# Patient Record
Sex: Male | Born: 1975
Health system: Southern US, Community
[De-identification: ages and names within clinical notes are randomized; demographics above are authoritative.]

## PROBLEM LIST (undated history)

## (undated) DIAGNOSIS — R7303 Prediabetes: Secondary | ICD-10-CM

## (undated) DIAGNOSIS — M1990 Unspecified osteoarthritis, unspecified site: Secondary | ICD-10-CM

## (undated) DIAGNOSIS — I10 Essential (primary) hypertension: Secondary | ICD-10-CM

## (undated) DIAGNOSIS — R609 Edema, unspecified: Secondary | ICD-10-CM

## (undated) DIAGNOSIS — G4730 Sleep apnea, unspecified: Secondary | ICD-10-CM

## (undated) DIAGNOSIS — R12 Heartburn: Secondary | ICD-10-CM

## (undated) DIAGNOSIS — E785 Hyperlipidemia, unspecified: Secondary | ICD-10-CM

## (undated) DIAGNOSIS — R0602 Shortness of breath: Secondary | ICD-10-CM

## (undated) HISTORY — DX: Edema, unspecified: R60.9

## (undated) HISTORY — DX: Sleep apnea, unspecified: G47.30

## (undated) HISTORY — PX: NO PAST SURGERIES: SHX2092

## (undated) HISTORY — DX: Shortness of breath: R06.02

## (undated) HISTORY — DX: Essential (primary) hypertension: I10

## (undated) HISTORY — DX: Hyperlipidemia, unspecified: E78.5

## (undated) HISTORY — DX: Heartburn: R12

---

## 2010-09-19 ENCOUNTER — Institutional Professional Consult (permissible substitution): Payer: Self-pay | Admitting: Internal Medicine

## 2010-10-11 ENCOUNTER — Ambulatory Visit: Payer: Self-pay | Admitting: Family Medicine

## 2010-11-17 ENCOUNTER — Ambulatory Visit: Payer: Self-pay | Admitting: Family Medicine

## 2010-11-22 ENCOUNTER — Ambulatory Visit (INDEPENDENT_AMBULATORY_CARE_PROVIDER_SITE_OTHER): Payer: Self-pay | Admitting: Family Medicine

## 2010-11-22 ENCOUNTER — Other Ambulatory Visit: Payer: Self-pay | Admitting: Family Medicine

## 2010-11-22 ENCOUNTER — Encounter: Payer: Self-pay | Admitting: Family Medicine

## 2010-11-22 VITALS — BP 138/90 | HR 97 | Temp 98.7°F | Ht 71.0 in | Wt 382.4 lb

## 2010-11-22 DIAGNOSIS — R319 Hematuria, unspecified: Secondary | ICD-10-CM

## 2010-11-22 DIAGNOSIS — I10 Essential (primary) hypertension: Secondary | ICD-10-CM

## 2010-11-22 LAB — POCT URINALYSIS DIPSTICK
Bilirubin, UA: NEGATIVE
Glucose, UA: NEGATIVE
Leukocytes, UA: NEGATIVE
Nitrite, UA: NEGATIVE
Urobilinogen, UA: 0.2

## 2010-11-22 MED ORDER — LISINOPRIL-HYDROCHLOROTHIAZIDE 20-12.5 MG PO TABS: ORAL_TABLET | ORAL | Status: DC

## 2010-11-22 NOTE — Progress Notes (Signed)
  Subjective:    Sean [name] is a 35 y.o. male who presents for evaluation of elevated blood pressures. Age at onset of elevated blood pressure:  17. Cardiac symptoms: mild vertigo. Patient denies: chest pain, chest pressure/discomfort, claudication, dyspnea, exertional chest pressure/discomfort, fatigue, irregular heart beat, lower extremity edema, near-syncope, orthopnea, palpitations, paroxysmal nocturnal dyspnea, syncope and tachypnea. Cardiovascular risk factors: family history of premature cardiovascular disease, hypertension, male gender, obesity (BMI >= 30 kg/m2) and sedentary lifestyle. Use of agents associated with hypertension: none. History of target organ damage: none.  The following portions of the patient's history were reviewed and updated as appropriate: allergies, current medications, past family history, past medical history, past social history, past surgical history and problem list.  Review of Systems Pertinent items are noted in HPI.   Objective:    BP 138/90  Pulse 97  Temp(Src) 98.7 F (37.1 C) (Oral)  Ht 5\' 11"  (1.803 m)  Wt 382 lb 6.4 oz (173.456 kg)  BMI 53.33 kg/m2  SpO2 98% General appearance: alert, cooperative, appears stated age and no distress Neck: no carotid bruit, no JVD, supple, symmetrical, trachea midline and thyroid not enlarged, symmetric, no tenderness/mass/nodules Lungs: clear to auscultation bilaterally Heart: regular rate and rhythm, S1, S2 normal, no murmur, click, rub or gallop Extremities: extremities normal, atraumatic, no cyanosis or edema  Cardiographics ECG: normal sinus rhythm    Assessment:    Hypertension, stage 1 . Evidence of target organ damage: none.    Plan:    Medication: increase to lisinopril 20/12.5  2 po qd . Screening labs for initial evaluation: basic metabolic panel, blood sugar, creatinine, lipid panel, potassium and urinalysis. Dietary sodium restriction. Regular aerobic exercise. Follow up: 2 weeks  and as needed.

## 2010-11-22 NOTE — Progress Notes (Signed)
Addended by: Legrand Como on: 11/22/2010 03:03 PM   Modules accepted: Orders

## 2010-11-22 NOTE — Progress Notes (Signed)
Addended by: Legrand Como on: 11/22/2010 03:02 PM   Modules accepted: Orders

## 2010-11-22 NOTE — Patient Instructions (Signed)

## 2010-11-23 LAB — CBC WITH DIFFERENTIAL/PLATELET
Basophils Absolute: 0 10*3/uL (ref 0.0–0.1)
Eosinophils Relative: 3.6 % (ref 0.0–5.0)
HCT: 44.1 % (ref 39.0–52.0)
Hemoglobin: 15 g/dL (ref 13.0–17.0)
Lymphocytes Relative: 42.3 % (ref 12.0–46.0)
Monocytes Relative: 10.8 % (ref 3.0–12.0)
Neutro Abs: 3 10*3/uL (ref 1.4–7.7)
Platelets: 252 10*3/uL (ref 150.0–400.0)
RDW: 13.4 % (ref 11.5–14.6)
WBC: 7 10*3/uL (ref 4.5–10.5)

## 2010-11-23 LAB — HEPATIC FUNCTION PANEL
ALT: 31 U/L (ref 0–53)
AST: 22 U/L (ref 0–37)
Alkaline Phosphatase: 55 U/L (ref 39–117)
Total Bilirubin: 0.7 mg/dL (ref 0.3–1.2)

## 2010-11-23 LAB — BASIC METABOLIC PANEL
Calcium: 9.1 mg/dL (ref 8.4–10.5)
GFR: 121.57 mL/min (ref >60.00)
Glucose, Bld: 75 mg/dL (ref 70–99)
Potassium: 3.8 mEq/L (ref 3.5–5.1)
Sodium: 137 mEq/L (ref 135–145)

## 2010-11-23 LAB — LIPID PANEL
Total CHOL/HDL Ratio: 6
VLDL: 26.6 mg/dL (ref 0.0–40.0)

## 2010-11-23 LAB — TSH: TSH: 1.17 u[IU]/mL (ref 0.35–5.50)

## 2010-12-06 ENCOUNTER — Ambulatory Visit: Payer: Self-pay | Admitting: Family Medicine

## 2010-12-08 ENCOUNTER — Encounter: Payer: Self-pay | Admitting: Family Medicine

## 2010-12-08 ENCOUNTER — Ambulatory Visit (INDEPENDENT_AMBULATORY_CARE_PROVIDER_SITE_OTHER): Payer: No Typology Code available for payment source | Admitting: Family Medicine

## 2010-12-08 VITALS — BP 128/88 | HR 105 | Temp 99.7°F | Wt 374.0 lb

## 2010-12-08 DIAGNOSIS — I10 Essential (primary) hypertension: Secondary | ICD-10-CM

## 2010-12-08 DIAGNOSIS — J40 Bronchitis, not specified as acute or chronic: Secondary | ICD-10-CM

## 2010-12-08 MED ORDER — CEFUROXIME AXETIL 500 MG PO TABS: 500.0000 mg | ORAL_TABLET | Freq: Two times a day (BID) | ORAL | Status: AC

## 2010-12-08 MED ORDER — ALBUTEROL SULFATE (5 MG/ML) 0.5% IN NEBU
2.5000 mg | INHALATION_SOLUTION | Freq: Once | RESPIRATORY_TRACT | Status: AC
  Administered 2010-12-08: 2.5 mg via RESPIRATORY_TRACT

## 2010-12-08 NOTE — Patient Instructions (Signed)

## 2010-12-08 NOTE — Progress Notes (Signed)
  Subjective:     Sean [name] is a 35 y.o. male who presents for evaluation of symptoms of a URI. Symptoms include congestion, cough described as productive, low grade fever, nasal congestion and shortness of breath. Onset of symptoms was 7 days ago, and has been gradually worsening since that time. Treatment to date: coricidan and mucinex.  The following portions of the patient's history were reviewed and updated as appropriate: allergies, current medications, past family history, past medical history, past social history, past surgical history and problem list.  Review of Systems Pertinent items are noted in HPI.   Objective:    BP 128/88  Pulse 105  Temp(Src) 99.7 F (37.6 C) (Oral)  Wt 374 lb (169.645 kg)  SpO2 96% General appearance: alert, cooperative, appears stated age and no distress Ears: normal TM's and external ear canals both ears Nose: green discharge, mild congestion Throat: lips, mucosa, and tongue normal; teeth and gums normal Neck: no adenopathy, supple, symmetrical, trachea midline and thyroid not enlarged, symmetric, no tenderness/mass/nodules Lungs: diminished breath sounds bilaterally and wheezes bilaterally Heart: regular rate and rhythm, S1, S2 normal, no murmur, click, rub or gallop Lymph nodes: Cervical, supraclavicular, and axillary nodes normal.   Assessment:    bronchitis  HTN--  Stable,   con't meds Plan:    Suggested symptomatic OTC remedies. Nasal saline spray for congestion. Ceftin per orders. Nasal steroids per orders. Follow up as needed. Call in several days if symptoms aren't resolving. Follow up in 3 months  or as needed.

## 2011-03-10 ENCOUNTER — Ambulatory Visit: Payer: No Typology Code available for payment source | Admitting: Family Medicine

## 2011-03-13 ENCOUNTER — Encounter: Payer: Self-pay | Admitting: Family Medicine

## 2011-03-13 ENCOUNTER — Ambulatory Visit (INDEPENDENT_AMBULATORY_CARE_PROVIDER_SITE_OTHER): Payer: No Typology Code available for payment source | Admitting: Family Medicine

## 2011-03-13 VITALS — BP 118/86 | HR 86 | Temp 97.9°F | Wt 370.0 lb

## 2011-03-13 DIAGNOSIS — E785 Hyperlipidemia, unspecified: Secondary | ICD-10-CM

## 2011-03-13 DIAGNOSIS — Z23 Encounter for immunization: Secondary | ICD-10-CM

## 2011-03-13 DIAGNOSIS — I10 Essential (primary) hypertension: Secondary | ICD-10-CM

## 2011-03-13 MED ORDER — LISINOPRIL-HYDROCHLOROTHIAZIDE 20-12.5 MG PO TABS: ORAL_TABLET | ORAL | Status: DC

## 2011-03-13 NOTE — Patient Instructions (Signed)

## 2011-03-13 NOTE — Progress Notes (Signed)
  Subjective:    Patient here for follow-up of elevated blood pressure.  He is exercising and is adherent to a low-salt diet.  Blood pressure is well controlled at home. Cardiac symptoms: none. Patient denies: chest pain, chest pressure/discomfort, claudication, dyspnea, exertional chest pressure/discomfort, fatigue, irregular heart beat, lower extremity edema, near-syncope, orthopnea, palpitations, paroxysmal nocturnal dyspnea, syncope and tachypnea. Cardiovascular risk factors: hypertension, male gender and obesity (BMI >= 30 kg/m2). Use of agents associated with hypertension: none. History of target organ damage: none.  The following portions of the patient's history were reviewed and updated as appropriate: allergies, current medications, past family history, past medical history, past social history, past surgical history and problem list.  Review of Systems Pertinent items are noted in HPI.     Objective:    BP 118/86  Pulse 86  Temp(Src) 97.9 F (36.6 C) (Oral)  Wt 370 lb (167.831 kg)  SpO2 96% General appearance: alert, cooperative, appears stated age, no distress and morbidly obese Lungs: clear to auscultation bilaterally Heart: S1, S2 normal Extremities: extremities normal, atraumatic, no cyanosis or edema    Assessment:    Hypertension, normal blood pressure . Evidence of target organ damage: none.   hyperlipidemia Plan:    Medication: continue lisinopril. Dietary sodium restriction. Regular aerobic exercise. Check blood pressures 2-3 times weekly and record. Follow up: 3 months and as needed.

## 2011-03-14 LAB — BASIC METABOLIC PANEL
CO2: 28 mEq/L (ref 19–32)
GFR: 85.79 mL/min (ref >60.00)
Glucose, Bld: 83 mg/dL (ref 70–99)
Potassium: 4 mEq/L (ref 3.5–5.1)
Sodium: 138 mEq/L (ref 135–145)

## 2011-03-14 LAB — HEPATIC FUNCTION PANEL
Bilirubin, Direct: 0.1 mg/dL (ref 0.0–0.3)
Total Bilirubin: 0.8 mg/dL (ref 0.3–1.2)
Total Protein: 7.7 g/dL (ref 6.0–8.3)

## 2011-03-14 LAB — LIPID PANEL
HDL: 40 mg/dL (ref >39.00)
VLDL: 18.2 mg/dL (ref 0.0–40.0)

## 2011-03-28 MED ORDER — ATORVASTATIN CALCIUM 20 MG PO TABS: 20.0000 mg | ORAL_TABLET | Freq: Every day | ORAL | Status: DC

## 2011-04-17 ENCOUNTER — Ambulatory Visit (INDEPENDENT_AMBULATORY_CARE_PROVIDER_SITE_OTHER): Payer: No Typology Code available for payment source | Admitting: Internal Medicine

## 2011-04-17 ENCOUNTER — Encounter: Payer: Self-pay | Admitting: Internal Medicine

## 2011-04-17 VITALS — BP 120/82 | HR 106 | Temp 99.1°F | Ht 70.75 in | Wt 361.0 lb

## 2011-04-17 DIAGNOSIS — J069 Acute upper respiratory infection, unspecified: Secondary | ICD-10-CM

## 2011-04-17 DIAGNOSIS — L259 Unspecified contact dermatitis, unspecified cause: Secondary | ICD-10-CM

## 2011-04-17 DIAGNOSIS — L309 Dermatitis, unspecified: Secondary | ICD-10-CM

## 2011-04-17 MED ORDER — PREDNISONE 10 MG PO TABS: ORAL_TABLET | ORAL | Status: DC

## 2011-04-17 NOTE — Progress Notes (Signed)
  Subjective:    Patient ID: Sean [name], male    DOB: 1975/09/27, 36 y.o.   MRN: [id number]  HPI  Acute visit  Chief complaint today is a rash. The rash started about 4 days ago, one day after he worked in the yard and was exposed to poison ivy. It is very itchy, starting the face and neck, spread to the abdomen and yesterday between the fingers and at the wrists. 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 Has been using Benadryl and over-the-counter topical medication with some relief Also, 2 days ago developed a mild sore throat with some nose congestion and a very mild sputum production.     Past Medical History  Diagnosis Date  . Hypertension      Review of Systems No fever or chills Mild chest congestion but no shortness of breath. He also complains of an ill-defined pain, left side of the mouth, "it is not a dental pain" points to the left side of the tongue and gum.    Objective:   Physical Exam  Constitutional: He appears well-developed. No distress.  HENT:  Right Ear: External ear normal.  Left Ear: External ear normal.       Lips, tongue normal, palpation of gum w/no evidence of abcess, throat symmetric, tonsil slt enlarged but normal  Cardiovascular: Normal rate, regular rhythm and normal heart sounds.   No murmur heard. Pulmonary/Chest: Effort normal and breath sounds normal. No respiratory distress. He has no wheezes. He has no rales.  Lymphadenopathy:    He has no cervical adenopathy.  Skin: He is not diaphoretic.       Diffuse maculopapular erythema at the abdomen, wrists, some on the middle of the anterior chest; similar scattered spot in the forehead and frontal scalp. He does have some classic linear, raised, small blister lesions and the hands and between the fingers.          Assessment & Plan:  Dermatitis: He does have some classic poison ivy lesions and other areas that are not classic; does not have generalize rash and does not look  systemically ill thus will treat for poison ivy URI: Mild cough likely from an URI. See instruction Tongue, dental pain?: Id no better soon, needs to see the dentist

## 2011-04-17 NOTE — Patient Instructions (Signed)
Poison [employer] ivy is a rash caused by touching the leaves of the poison ivy plant. The rash often shows up 48 hours later. You might just have bumps, redness, and itching. Sometimes, blisters appear and break open. Your eyes may get puffy (swollen). Poison ivy often heals in 2 to 3 weeks without treatment. HOME CARE  If you touch poison ivy:   Wash your skin with soap and water right away. Wash under your fingernails. Do not rub the skin very hard.   Wash any clothes you were wearing.   Avoid poison ivy in the future. Poison ivy has 3 leaves on a stem.   Use medicine to help with itching as told by your doctor. Do not drive when you take this medicine.   Keep open sores dry, clean, and covered with a bandage and medicated cream, if needed.   Ask your doctor about medicine for children.  GET HELP RIGHT AWAY IF:  You have open sores.   Redness spreads beyond the area of the rash.   There is yellowish white fluid (pus) coming from the rash.   Pain gets worse.   You have a temperature by mouth above 102 F (38.9 C), not controlled by medicine.  MAKE SURE YOU:  Understand these instructions.   Will watch your condition.   Will get help right away if you are not doing well or get worse.  Document Released: 02/25/2010 Document Revised: 01/12/2011 Document Reviewed: 02/25/2010 [facility] Patient Information 2012 [place], [place].    Take prednisone as prescribed mucinex DM for the cough Call in few days if no better , call any time if you get worse: fever, more cough, lip-tongue swelling If the tonge pain continue, you need to see the dentist

## 2011-06-12 ENCOUNTER — Encounter: Payer: No Typology Code available for payment source | Admitting: Family Medicine

## 2011-06-12 DIAGNOSIS — Z0289 Encounter for other administrative examinations: Secondary | ICD-10-CM

## 2011-06-13 NOTE — Progress Notes (Signed)
This encounter was created in error - please disregard.

## 2011-07-20 ENCOUNTER — Emergency Department (HOSPITAL_BASED_OUTPATIENT_CLINIC_OR_DEPARTMENT_OTHER)
Admission: EM | Admit: 2011-07-20 | Discharge: 2011-07-20 | Disposition: A | Discharge status: Home / Self Care | Payer: Self-pay | Attending: Emergency Medicine | Admitting: Emergency Medicine

## 2011-07-20 DIAGNOSIS — I10 Essential (primary) hypertension: Secondary | ICD-10-CM | POA: Insufficient documentation

## 2011-07-20 DIAGNOSIS — M79609 Pain in unspecified limb: Secondary | ICD-10-CM | POA: Insufficient documentation

## 2011-07-20 DIAGNOSIS — L089 Local infection of the skin and subcutaneous tissue, unspecified: Secondary | ICD-10-CM

## 2011-07-20 MED ORDER — DOXYCYCLINE HYCLATE 100 MG PO CAPS: 100.0000 mg | ORAL_CAPSULE | Freq: Two times a day (BID) | ORAL | Status: AC

## 2011-07-20 MED ORDER — HYDROCODONE-ACETAMINOPHEN 5-325 MG PO TABS: 2.0000 | ORAL_TABLET | ORAL | Status: AC | PRN

## 2011-07-20 MED ORDER — DOXYCYCLINE HYCLATE 100 MG PO CAPS: 100.0000 mg | ORAL_CAPSULE | Freq: Two times a day (BID) | ORAL | Status: DC

## 2011-07-20 NOTE — ED Provider Notes (Signed)
Medical screening examination/treatment/procedure(s) were performed by non-physician practitioner and as supervising physician I was immediately available for consultation/collaboration.    A , MD 07/20/11 2315 

## 2011-07-20 NOTE — ED Notes (Signed)
Right finger pain and swelling at cuticle. Redness and swelling noted.

## 2011-07-20 NOTE — Discharge Instructions (Signed)
Fingertip Infection   When an infection is around the nail, it is called a paronychia. When it appears over the tip of the finger, it is called a felon. These infections are due to minor injuries or cracks in the skin. If they are not treated properly, they can lead to bone infection and permanent damage to the fingernail.   Incision and drainage is necessary if a pus pocket (an abscess) has formed. Antibiotics and pain medicine may also be needed. Keep your hand elevated for the next 2-3 days to reduce swelling and pain. If a pack was placed in the abscess, it should be removed in 1-2 days by your caregiver. Soak the finger in warm water for 20 minutes 4 times daily to help promote drainage.   Keep the hands as dry as possible. Wear protective gloves with cotton liners. See your caregiver for follow-up care as recommended.   HOME CARE INSTRUCTIONS   Keep wound clean, dry and dressed as suggested by your caregiver.   Soak in warm salt water for fifteen minutes, four times per day for bacterial infections.   Your caregiver will prescribe an antibiotic if a bacterial infection is suspected. Take antibiotics as directed and finish the prescription, even if the problem appears to be improving before the medicine is gone.   Only take over-the-counter or prescription medicines for pain, discomfort, or fever as directed by your caregiver.   SEEK IMMEDIATE MEDICAL CARE IF:   There is redness, swelling, or increasing pain in the wound.   Pus or any other unusual drainage is coming from the wound.   An unexplained oral temperature above 102 F (38.9 C) develops.   You notice a foul smell coming from the wound or dressing.   MAKE SURE YOU:   Understand these instructions.   Monitor your condition.   Contact your caregiver if you are getting worse or not improving.   Document Released: 03/02/2004 Document Revised: 01/12/2011 Document Reviewed: 02/27/2008   [employer] Patient Information 2012 [employer].

## 2011-07-20 NOTE — ED Provider Notes (Signed)
History     CSN: [id number]  Arrival date & time 07/20/11  4098   First MD Initiated Contact with Patient 07/20/11 1852      Chief Complaint  Patient presents with  . Hand Pain    Right pointer finger    (Consider location/radiation/quality/duration/timing/severity/associated sxs/prior treatment) Patient is a 36 y.o. male presenting with hand pain. The history is provided by the patient. No language interpreter was used.  Hand Pain This is a new problem. The current episode started in the past 7 days. The problem occurs constantly. The problem has been gradually worsening. He has tried NSAIDs for the symptoms. The treatment provided no relief.  Pt complains of pain in the end of his finger,  Pt has redness and swelling  Past Medical History  Diagnosis Date  . Hypertension     No past surgical history on file.  Family History  Problem Relation Age of Onset  . Heart disease Father 9    MI  . Hypertension Father   . Sudden death Father     Heart disease  . Diabetes Mother     Borderline  . Hyperlipidemia Mother   . Hypertension Mother   . Breast cancer Maternal Grandmother     History  Substance Use Topics  . Smoking status: Never Smoker   . Smokeless tobacco: Never Used  . Alcohol Use: Yes      Review of Systems  Skin: Positive for wound.  All other systems reviewed and are negative.    Allergies  Review of patient's allergies indicates no known allergies.  Home Medications   Current Outpatient Rx  Name Route Sig Dispense Refill  . IBUPROFEN 200 MG PO TABS Oral Take 600-800 mg by mouth daily as needed. For pain    . LISINOPRIL-HYDROCHLOROTHIAZIDE 20-12.5 MG PO TABS Oral Take 2 tablets by mouth daily.      BP 139/82  Pulse 93  Temp 98.3 F (36.8 C) (Oral)  Resp 20  Wt 364 lb (165.109 kg)  SpO2 96%  Physical Exam  Nursing note and vitals reviewed. Constitutional: He appears well-developed and well-nourished.  Musculoskeletal: He exhibits  tenderness.       Tender right index finger,  Red from dip down,  Erythema around cuticle,  No fluctuance.  Neurological: He is alert.  Skin: Skin is warm.  Psychiatric: He has a normal mood and affect.    ED Course  Procedures (including critical care time)  Labs Reviewed - No data to display No results found.   No diagnosis found.    MDM  I used an 18 gauge to see if there is any pus around nail,  Blood only,        Lonia Skinner [place], [place] 07/20/11 1909

## 2011-08-21 ENCOUNTER — Other Ambulatory Visit: Payer: Self-pay | Admitting: Family Medicine

## 2011-08-21 MED ORDER — LISINOPRIL-HYDROCHLOROTHIAZIDE 20-12.5 MG PO TABS: 2.0000 | ORAL_TABLET | Freq: Every day | ORAL | Status: DC

## 2011-08-21 NOTE — Telephone Encounter (Signed)
REFILL Lisinopril-Hydrochlorothiazide (Tab) PRINZIDE,ZESTORETIC 20-12.5 MG Take 2 tablets by mouth daily. #60, LAST FILL 5.31.13 laST OV 3.11.13

## 2013-10-22 ENCOUNTER — Telehealth: Payer: Self-pay | Admitting: Family Medicine

## 2013-10-22 NOTE — Telephone Encounter (Signed)
Caller name: Shi Relation to pt: self Call back number: [phone] Pharmacy: [facility] in [place] off [address]  Reason for call:   Patient has not been seen since 2013 and is out of lisinopril and would like refill to be [facility] to [facility]. I did schedule appointment for cpe/med refill for 9/21

## 2013-10-23 MED ORDER — LISINOPRIL-HYDROCHLOROTHIAZIDE 20-12.5 MG PO TABS: 2.0000 | ORAL_TABLET | Freq: Every day | ORAL | Status: DC

## 2013-10-23 NOTE — Telephone Encounter (Signed)
Please advise if refill appropriate. He has a pending apt.    KP

## 2013-10-23 NOTE — Telephone Encounter (Signed)
Rx faxed for a 30 day supply.     KP

## 2013-10-23 NOTE — Telephone Encounter (Signed)
#  30 only

## 2013-10-27 ENCOUNTER — Ambulatory Visit (INDEPENDENT_AMBULATORY_CARE_PROVIDER_SITE_OTHER): Payer: PRIVATE HEALTH INSURANCE | Admitting: Family Medicine

## 2013-10-27 ENCOUNTER — Encounter: Payer: Self-pay | Admitting: Family Medicine

## 2013-10-27 VITALS — BP 152/97 | HR 72 | Temp 98.4°F | Ht 70.5 in | Wt 394.8 lb

## 2013-10-27 DIAGNOSIS — Z23 Encounter for immunization: Secondary | ICD-10-CM

## 2013-10-27 DIAGNOSIS — Z0000 Encounter for general adult medical examination without abnormal findings: Secondary | ICD-10-CM

## 2013-10-27 DIAGNOSIS — I10 Essential (primary) hypertension: Secondary | ICD-10-CM

## 2013-10-27 LAB — CBC WITH DIFFERENTIAL/PLATELET
Basophils Absolute: 0 10*3/uL (ref 0.0–0.1)
Basophils Relative: 0.5 % (ref 0.0–3.0)
EOS PCT: 4 % (ref 0.0–5.0)
Eosinophils Absolute: 0.4 10*3/uL (ref 0.0–0.7)
HEMATOCRIT: 42.3 % (ref 39.0–52.0)
HEMOGLOBIN: 14.5 g/dL (ref 13.0–17.0)
LYMPHS ABS: 2.8 10*3/uL (ref 0.7–4.0)
Lymphocytes Relative: 27.4 % (ref 12.0–46.0)
MCHC: 34.2 g/dL (ref 30.0–36.0)
MCV: 83.8 fl (ref 78.0–100.0)
MONOS PCT: 8.1 % (ref 3.0–12.0)
Monocytes Absolute: 0.8 10*3/uL (ref 0.1–1.0)
NEUTROS ABS: 6.1 10*3/uL (ref 1.4–7.7)
Neutrophils Relative %: 60 % (ref 43.0–77.0)
Platelets: 239 10*3/uL (ref 150.0–400.0)
RBC: 5.05 Mil/uL (ref 4.22–5.81)
RDW: 13.5 % (ref 11.5–15.5)
WBC: 10.1 10*3/uL (ref 4.0–10.5)

## 2013-10-27 LAB — TSH: TSH: 0.44 u[IU]/mL (ref 0.35–4.50)

## 2013-10-27 LAB — BASIC METABOLIC PANEL
BUN: 12 mg/dL (ref 6–23)
CALCIUM: 8.7 mg/dL (ref 8.4–10.5)
CHLORIDE: 106 meq/L (ref 96–112)
CO2: 26 meq/L (ref 19–32)
Creatinine, Ser: 0.9 mg/dL (ref 0.4–1.5)
GFR: 98.67 mL/min (ref >60.00)
Glucose, Bld: 79 mg/dL (ref 70–99)
Potassium: 3.8 mEq/L (ref 3.5–5.1)
SODIUM: 140 meq/L (ref 135–145)

## 2013-10-27 LAB — HEPATIC FUNCTION PANEL
ALBUMIN: 3.7 g/dL (ref 3.5–5.2)
ALT: 32 U/L (ref 0–53)
AST: 22 U/L (ref 0–37)
Alkaline Phosphatase: 57 U/L (ref 39–117)
Bilirubin, Direct: 0 mg/dL (ref 0.0–0.3)
Total Bilirubin: 0.4 mg/dL (ref 0.2–1.2)
Total Protein: 7.1 g/dL (ref 6.0–8.3)

## 2013-10-27 LAB — POCT URINALYSIS DIPSTICK
Bilirubin, UA: NEGATIVE
Blood, UA: NEGATIVE
GLUCOSE UA: NEGATIVE
KETONES UA: NEGATIVE
Leukocytes, UA: NEGATIVE
Nitrite, UA: NEGATIVE
Protein, UA: NEGATIVE
UROBILINOGEN UA: 0.2
pH, UA: 5.5

## 2013-10-27 LAB — LIPID PANEL
CHOLESTEROL: 225 mg/dL — AB (ref 0–200)
HDL: 32.5 mg/dL — ABNORMAL LOW (ref >39.00)
NonHDL: 192.5
Total CHOL/HDL Ratio: 7
Triglycerides: 204 mg/dL — ABNORMAL HIGH (ref 0.0–149.0)
VLDL: 40.8 mg/dL — ABNORMAL HIGH (ref 0.0–40.0)

## 2013-10-27 LAB — LDL CHOLESTEROL, DIRECT: Direct LDL: 186.2 mg/dL

## 2013-10-27 MED ORDER — LISINOPRIL-HYDROCHLOROTHIAZIDE 20-12.5 MG PO TABS: 2.0000 | ORAL_TABLET | Freq: Every day | ORAL | Status: DC

## 2013-10-27 NOTE — Progress Notes (Signed)
Subjective:    Patient ID: Sean [name], male    DOB: September 19, 1975, 38 y.o.   MRN: [id number]  HPI Pt here for cpe and labs.  Pt ran out of meds Thursday. No other complaints.    Review of Systems  Constitutional: Negative.   HENT: Negative for congestion, ear pain, hearing loss, nosebleeds, postnasal drip, rhinorrhea, sinus pressure, sneezing and tinnitus.   Eyes: Negative for photophobia, discharge, itching and visual disturbance.  Respiratory: Negative.   Cardiovascular: Negative.   Gastrointestinal: Negative for abdominal pain, constipation, blood in stool, abdominal distention and anal bleeding.  Endocrine: Negative.   Genitourinary: Negative.   Musculoskeletal: Negative.   Skin: Negative.   Allergic/Immunologic: Negative.   Neurological: Negative for dizziness, weakness, light-headedness, numbness and headaches.  Psychiatric/Behavioral: Negative for suicidal ideas, confusion, sleep disturbance, dysphoric mood, decreased concentration and agitation. The patient is not nervous/anxious.    Past Medical History  Diagnosis Date  . Hypertension    History   Social History  . Marital Status: Single    Spouse Name: N/A    Number of Children: N/A  . Years of Education: N/A   Occupational History  . Not on file.   Social History Main Topics  . Smoking status: Never Smoker   . Smokeless tobacco: Never Used  . Alcohol Use: Yes  . Drug Use: No  . Sexual Activity: Yes   Other Topics Concern  . Not on file   Social History Narrative  . No narrative on file   Family History  Problem Relation Age of Onset  . Heart disease Father 10    MI  . Hypertension Father   . Sudden death Father     Heart disease  . Diabetes Mother     Borderline  . Hyperlipidemia Mother   . Hypertension Mother   . Breast cancer Maternal Grandmother    Current Outpatient Prescriptions  Medication Sig Dispense Refill  . ibuprofen (ADVIL,MOTRIN) 200 MG tablet Take 600-800 mg by mouth  daily as needed. For pain      . lisinopril-hydrochlorothiazide (PRINZIDE,ZESTORETIC) 20-12.5 MG per tablet Take 2 tablets by mouth daily.  180 tablet  3   No current facility-administered medications for this visit.   No Known Allergies      Objective:   Physical Exam  Nursing note and vitals reviewed. Constitutional: He is oriented to person, place, and time. He appears well-developed and well-nourished. No distress.  HENT:  Head: Normocephalic and atraumatic.  Right Ear: External ear normal.  Left Ear: External ear normal.  Nose: Nose normal.  Mouth/Throat: Oropharynx is clear and moist. No oropharyngeal exudate.  Eyes: Conjunctivae and EOM are normal. Pupils are equal, round, and reactive to light. Right eye exhibits no discharge. Left eye exhibits no discharge.  Neck: Normal range of motion. Neck supple. No JVD present. No thyromegaly present.  Cardiovascular: Normal rate, regular rhythm and intact distal pulses.  Exam reveals no gallop and no friction rub.   No murmur heard. Pulmonary/Chest: Effort normal and breath sounds normal. No respiratory distress. He has no wheezes. He has no rales. He exhibits no tenderness.  Abdominal: Soft. Bowel sounds are normal. He exhibits no distension and no mass. There is no tenderness. There is no rebound and no guarding.  Genitourinary: Rectum normal, prostate normal and penis normal. Guaiac negative stool.  Musculoskeletal: Normal range of motion. He exhibits no edema and no tenderness.  Lymphadenopathy:    He has no cervical adenopathy.  Neurological: He  is alert and oriented to person, place, and time. He displays normal reflexes. He exhibits normal muscle tone.  Skin: Skin is warm and dry. No rash noted. He is not diaphoretic. No erythema. No pallor.  Psychiatric: He has a normal mood and affect. His behavior is normal. Judgment and thought content normal.   Filed Vitals:   10/27/13 1028  BP: 152/97  Pulse: 72  Temp: 98.4 F (36.9  C)  TempSrc: Oral  Height: 5' 10.5" (1.791 m)  Weight: 394 lb 12.8 oz (179.08 kg)  SpO2: 100%          Assessment & Plan:  1. Essential hypertension Check labs Elevated today --- increase dose  - lisinopril-hydrochlorothiazide (PRINZIDE,ZESTORETIC) 20-12.5 MG per tablet; Take 2 tablets by mouth daily.  Dispense: 180 tablet; Refill: 3 - Basic metabolic panel - CBC with Differential - Hepatic function panel - Lipid panel - POCT urinalysis dipstick - TSH  2. Preventative health care Check labs ghm utd  - Basic metabolic panel - CBC with Differential - Hepatic function panel - Lipid panel - POCT urinalysis dipstick - TSH

## 2013-10-27 NOTE — Progress Notes (Signed)
Pre visit review using our clinic review tool, if applicable. No additional management support is needed unless otherwise documented below in the visit note. 

## 2013-10-27 NOTE — Patient Instructions (Signed)
Preventive Care for Adults A healthy lifestyle and preventive care can promote health and wellness. Preventive health guidelines for men include the following key practices:  A routine yearly physical is a good way to check with your health care provider about your health and preventative screening. It is a chance to share any concerns and updates on your health and to receive a thorough exam.  Visit your dentist for a routine exam and preventative care every 6 months. Brush your teeth twice a day and floss once a day. Good oral hygiene prevents tooth decay and gum disease.  The frequency of eye exams is based on your age, health, family medical history, use of contact lenses, and other factors. Follow your health care provider's recommendations for frequency of eye exams.  Eat a healthy diet. Foods such as vegetables, fruits, whole grains, low-fat dairy products, and lean protein foods contain the nutrients you need without too many calories. Decrease your intake of foods high in solid fats, added sugars, and salt. Eat the right amount of calories for you.Get information about a proper diet from your health care provider, if necessary.  Regular physical exercise is one of the most important things you can do for your health. Most adults should get at least 150 minutes of moderate-intensity exercise (any activity that increases your heart rate and causes you to sweat) each week. In addition, most adults need muscle-strengthening exercises on 2 or more days a week.  Maintain a healthy weight. The body mass index (BMI) is a screening tool to identify possible weight problems. It provides an estimate of body fat based on height and weight. Your health care provider can find your BMI and can help you achieve or maintain a healthy weight.For adults 20 years and older:  A BMI below 18.5 is considered underweight.  A BMI of 18.5 to 24.9 is normal.  A BMI of 25 to 29.9 is considered overweight.  A BMI  of 30 and above is considered obese.  Maintain normal blood lipids and cholesterol levels by exercising and minimizing your intake of saturated fat. Eat a balanced diet with plenty of fruit and vegetables. Blood tests for lipids and cholesterol should begin at age 50 and be repeated every 5 years. If your lipid or cholesterol levels are high, you are over 50, or you are at high risk for heart disease, you may need your cholesterol levels checked more frequently.Ongoing high lipid and cholesterol levels should be treated with medicines if diet and exercise are not working.  If you smoke, find out from your health care provider how to quit. If you do not use tobacco, do not start.  Lung cancer screening is recommended for adults aged 73-80 years who are at high risk for developing lung cancer because of a history of smoking. A yearly low-dose CT scan of the lungs is recommended for people who have at least a 30-pack-year history of smoking and are a current smoker or have quit within the past 15 years. A pack year of smoking is smoking an average of 1 pack of cigarettes a day for 1 year (for example: 1 pack a day for 30 years or 2 packs a day for 15 years). Yearly screening should continue until the smoker has stopped smoking for at least 15 years. Yearly screening should be stopped for people who develop a health problem that would prevent them from having lung cancer treatment.  If you choose to drink alcohol, do not have more than  2 drinks per day. One drink is considered to be 12 ounces (355 mL) of beer, 5 ounces (148 mL) of wine, or 1.5 ounces (44 mL) of liquor.  Avoid use of street drugs. Do not share needles with anyone. Ask for help if you need support or instructions about stopping the use of drugs.  High blood pressure causes heart disease and increases the risk of stroke. Your blood pressure should be checked at least every 1-2 years. Ongoing high blood pressure should be treated with  medicines, if weight loss and exercise are not effective.  If you are 45-79 years old, ask your health care provider if you should take aspirin to prevent heart disease.  Diabetes screening involves taking a blood sample to check your fasting blood sugar level. This should be done once every 3 years, after age 45, if you are within normal weight and without risk factors for diabetes. Testing should be considered at a younger age or be carried out more frequently if you are overweight and have at least 1 risk factor for diabetes.  Colorectal cancer can be detected and often prevented. Most routine colorectal cancer screening begins at the age of 50 and continues through age 75. However, your health care provider may recommend screening at an earlier age if you have risk factors for colon cancer. On a yearly basis, your health care provider may provide home test kits to check for hidden blood in the stool. Use of a small camera at the end of a tube to directly examine the colon (sigmoidoscopy or colonoscopy) can detect the earliest forms of colorectal cancer. Talk to your health care provider about this at age 50, when routine screening begins. Direct exam of the colon should be repeated every 5-10 years through age 75, unless early forms of precancerous polyps or small growths are found.  People who are at an increased risk for hepatitis B should be screened for this virus. You are considered at high risk for hepatitis B if:  You were born in a country where hepatitis B occurs often. Talk with your health care provider about which countries are considered high risk.  Your parents were born in a high-risk country and you have not received a shot to protect against hepatitis B (hepatitis B vaccine).  You have HIV or AIDS.  You use needles to inject street drugs.  You live with, or have sex with, someone who has hepatitis B.  You are a man who has sex with other men (MSM).  You get hemodialysis  treatment.  You take certain medicines for conditions such as cancer, organ transplantation, and autoimmune conditions.  Hepatitis C blood testing is recommended for all people born from 1945 through 1965 and any individual with known risks for hepatitis C.  Practice safe sex. Use condoms and avoid high-risk sexual practices to reduce the spread of sexually transmitted infections (STIs). STIs include gonorrhea, chlamydia, syphilis, trichomonas, herpes, HPV, and human immunodeficiency virus (HIV). Herpes, HIV, and HPV are viral illnesses that have no cure. They can result in disability, cancer, and death.  If you are at risk of being infected with HIV, it is recommended that you take a prescription medicine daily to prevent HIV infection. This is called preexposure prophylaxis (PrEP). You are considered at risk if:  You are a man who has sex with other men (MSM) and have other risk factors.  You are a heterosexual man, are sexually active, and are at increased risk for HIV infection.    You take drugs by injection.  You are sexually active with a partner who has HIV.  Talk with your health care provider about whether you are at high risk of being infected with HIV. If you choose to begin PrEP, you should first be tested for HIV. You should then be tested every 3 months for as long as you are taking PrEP.  A one-time screening for abdominal aortic aneurysm (AAA) and surgical repair of large AAAs by ultrasound are recommended for men ages 32 to 67 years who are current or former smokers.  Healthy men should no longer receive prostate-specific antigen (PSA) blood tests as part of routine cancer screening. Talk with your health care provider about prostate cancer screening.  Testicular cancer screening is not recommended for adult males who have no symptoms. Screening includes self-exam, a health care provider exam, and other screening tests. Consult with your health care provider about any symptoms  you have or any concerns you have about testicular cancer.  Use sunscreen. Apply sunscreen liberally and repeatedly throughout the day. You should seek shade when your shadow is shorter than you. Protect yourself by wearing long sleeves, pants, a wide-brimmed hat, and sunglasses year round, whenever you are outdoors.  Once a month, do a whole-body skin exam, using a mirror to look at the skin on your back. Tell your health care provider about new moles, moles that have irregular borders, moles that are larger than a pencil eraser, or moles that have changed in shape or color.  Stay current with required vaccines (immunizations).  Influenza vaccine. All adults should be immunized every year.  Tetanus, diphtheria, and acellular pertussis (Td, Tdap) vaccine. An adult who has not previously received Tdap or who does not know his vaccine status should receive 1 dose of Tdap. This initial dose should be followed by tetanus and diphtheria toxoids (Td) booster doses every 10 years. Adults with an unknown or incomplete history of completing a 3-dose immunization series with Td-containing vaccines should begin or complete a primary immunization series including a Tdap dose. Adults should receive a Td booster every 10 years.  Varicella vaccine. An adult without evidence of immunity to varicella should receive 2 doses or a second dose if he has previously received 1 dose.  Human papillomavirus (HPV) vaccine. Males aged 68-21 years who have not received the vaccine previously should receive the 3-dose series. Males aged 22-26 years may be immunized. Immunization is recommended through the age of 6 years for any male who has sex with males and did not get any or all doses earlier. Immunization is recommended for any person with an immunocompromised condition through the age of 49 years if he did not get any or all doses earlier. During the 3-dose series, the second dose should be obtained 4-8 weeks after the first  dose. The third dose should be obtained 24 weeks after the first dose and 16 weeks after the second dose.  Zoster vaccine. One dose is recommended for adults aged 50 years or older unless certain conditions are present.  Measles, mumps, and rubella (MMR) vaccine. Adults born before [birth year] generally are considered immune to measles and mumps. Adults born in [birth year] or later should have 1 or more doses of MMR vaccine unless there is a contraindication to the vaccine or there is laboratory evidence of immunity to each of the three diseases. A routine second dose of MMR vaccine should be obtained at least 28 days after the first dose for students attending postsecondary  schools, health care workers, or international travelers. People who received inactivated measles vaccine or an unknown type of measles vaccine during 1963-1967 should receive 2 doses of MMR vaccine. People who received inactivated mumps vaccine or an unknown type of mumps vaccine before 1979 and are at high risk for mumps infection should consider immunization with 2 doses of MMR vaccine. Unvaccinated health care workers born before 1957 who lack laboratory evidence of measles, mumps, or rubella immunity or laboratory confirmation of disease should consider measles and mumps immunization with 2 doses of MMR vaccine or rubella immunization with 1 dose of MMR vaccine.  Pneumococcal 13-valent conjugate (PCV13) vaccine. When indicated, a person who is uncertain of his immunization history and has no record of immunization should receive the PCV13 vaccine. An adult aged 19 years or older who has certain medical conditions and has not been previously immunized should receive 1 dose of PCV13 vaccine. This PCV13 should be followed with a dose of pneumococcal polysaccharide (PPSV23) vaccine. The PPSV23 vaccine dose should be obtained at least 8 weeks after the dose of PCV13 vaccine. An adult aged 19 years or older who has certain medical conditions and  previously received 1 or more doses of PPSV23 vaccine should receive 1 dose of PCV13. The PCV13 vaccine dose should be obtained 1 or more years after the last PPSV23 vaccine dose.  Pneumococcal polysaccharide (PPSV23) vaccine. When PCV13 is also indicated, PCV13 should be obtained first. All adults aged 65 years and older should be immunized. An adult younger than age 65 years who has certain medical conditions should be immunized. Any person who resides in a nursing home or long-term care facility should be immunized. An adult smoker should be immunized. People with an immunocompromised condition and certain other conditions should receive both PCV13 and PPSV23 vaccines. People with human immunodeficiency virus (HIV) infection should be immunized as soon as possible after diagnosis. Immunization during chemotherapy or radiation therapy should be avoided. Routine use of PPSV23 vaccine is not recommended for American Indians, [place] Natives, or people younger than 65 years unless there are medical conditions that require PPSV23 vaccine. When indicated, people who have unknown immunization and have no record of immunization should receive PPSV23 vaccine. One-time revaccination 5 years after the first dose of PPSV23 is recommended for people aged 19-64 years who have chronic kidney failure, nephrotic syndrome, asplenia, or immunocompromised conditions. People who received 1-2 doses of PPSV23 before age 65 years should receive another dose of PPSV23 vaccine at age 65 years or later if at least 5 years have passed since the previous dose. Doses of PPSV23 are not needed for people immunized with PPSV23 at or after age 65 years.  Meningococcal vaccine. Adults with asplenia or persistent complement component deficiencies should receive 2 doses of quadrivalent meningococcal conjugate (MenACWY-D) vaccine. The doses should be obtained at least 2 months apart. Microbiologists working with certain meningococcal bacteria,  military recruits, people at risk during an outbreak, and people who travel to or live in countries with a high rate of meningitis should be immunized. A first-year college student up through age 21 years who is living in a residence hall should receive a dose if he did not receive a dose on or after his 16th birthday. Adults who have certain high-risk conditions should receive one or more doses of vaccine.  Hepatitis A vaccine. Adults who wish to be protected from this disease, have certain high-risk conditions, work with hepatitis A-infected animals, work in hepatitis A research labs, or   travel to or work in countries with a high rate of hepatitis A should be immunized. Adults who were previously unvaccinated and who anticipate close contact with an international adoptee during the first 60 days after arrival in the [place] from a country with a high rate of hepatitis A should be immunized.  Hepatitis B vaccine. Adults should be immunized if they wish to be protected from this disease, have certain high-risk conditions, may be exposed to blood or other infectious body fluids, are household contacts or sex partners of hepatitis B positive people, are clients or workers in certain care facilities, or travel to or work in countries with a high rate of hepatitis B.  Haemophilus influenzae type b (Hib) vaccine. A previously unvaccinated person with asplenia or sickle cell disease or having a scheduled splenectomy should receive 1 dose of Hib vaccine. Regardless of previous immunization, a recipient of a hematopoietic stem cell transplant should receive a 3-dose series 6-12 months after his successful transplant. Hib vaccine is not recommended for adults with HIV infection. Preventive Service / Frequency Ages 52 to 17  Blood pressure check.** / Every 1 to 2 years.  Lipid and cholesterol check.** / Every 5 years beginning at age 69.  Hepatitis C blood test.** / For any individual with known risks for  hepatitis C.  Skin self-exam. / Monthly.  Influenza vaccine. / Every year.  Tetanus, diphtheria, and acellular pertussis (Tdap, Td) vaccine.** / Consult your health care provider. 1 dose of Td every 10 years.  Varicella vaccine.** / Consult your health care provider.  HPV vaccine. / 3 doses over 6 months, if 72 or younger.  Measles, mumps, rubella (MMR) vaccine.** / You need at least 1 dose of MMR if you were born in 1957 or later. You may also need a second dose.  Pneumococcal 13-valent conjugate (PCV13) vaccine.** / Consult your health care provider.  Pneumococcal polysaccharide (PPSV23) vaccine.** / 1 to 2 doses if you smoke cigarettes or if you have certain conditions.  Meningococcal vaccine.** / 1 dose if you are age 35 to 60 years and a Market researcher living in a residence hall, or have one of several medical conditions. You may also need additional booster doses.  Hepatitis A vaccine.** / Consult your health care provider.  Hepatitis B vaccine.** / Consult your health care provider.  Haemophilus influenzae type b (Hib) vaccine.** / Consult your health care provider. Ages 35 to 8  Blood pressure check.** / Every 1 to 2 years.  Lipid and cholesterol check.** / Every 5 years beginning at age 57.  Lung cancer screening. / Every year if you are aged 44-80 years and have a 30-pack-year history of smoking and currently smoke or have quit within the past 15 years. Yearly screening is stopped once you have quit smoking for at least 15 years or develop a health problem that would prevent you from having lung cancer treatment.  Fecal occult blood test (FOBT) of stool. / Every year beginning at age 55 and continuing until age 73. You may not have to do this test if you get a colonoscopy every 10 years.  Flexible sigmoidoscopy** or colonoscopy.** / Every 5 years for a flexible sigmoidoscopy or every 10 years for a colonoscopy beginning at age 28 and continuing until age  1.  Hepatitis C blood test.** / For all people born from [birth year] through 1965 and any individual with known risks for hepatitis C.  Skin self-exam. / Monthly.  Influenza vaccine. / Every  year.  Tetanus, diphtheria, and acellular pertussis (Tdap/Td) vaccine.** / Consult your health care provider. 1 dose of Td every 10 years.  Varicella vaccine.** / Consult your health care provider.  Zoster vaccine.** / 1 dose for adults aged 53 years or older.  Measles, mumps, rubella (MMR) vaccine.** / You need at least 1 dose of MMR if you were born in 1957 or later. You may also need a second dose.  Pneumococcal 13-valent conjugate (PCV13) vaccine.** / Consult your health care provider.  Pneumococcal polysaccharide (PPSV23) vaccine.** / 1 to 2 doses if you smoke cigarettes or if you have certain conditions.  Meningococcal vaccine.** / Consult your health care provider.  Hepatitis A vaccine.** / Consult your health care provider.  Hepatitis B vaccine.** / Consult your health care provider.  Haemophilus influenzae type b (Hib) vaccine.** / Consult your health care provider. Ages 77 and over  Blood pressure check.** / Every 1 to 2 years.  Lipid and cholesterol check.**/ Every 5 years beginning at age 85.  Lung cancer screening. / Every year if you are aged 55-80 years and have a 30-pack-year history of smoking and currently smoke or have quit within the past 15 years. Yearly screening is stopped once you have quit smoking for at least 15 years or develop a health problem that would prevent you from having lung cancer treatment.  Fecal occult blood test (FOBT) of stool. / Every year beginning at age 33 and continuing until age 11. You may not have to do this test if you get a colonoscopy every 10 years.  Flexible sigmoidoscopy** or colonoscopy.** / Every 5 years for a flexible sigmoidoscopy or every 10 years for a colonoscopy beginning at age 28 and continuing until age 73.  Hepatitis C blood  test.** / For all people born from [birth year] through 1965 and any individual with known risks for hepatitis C.  Abdominal aortic aneurysm (AAA) screening.** / A one-time screening for ages 50 to 27 years who are current or former smokers.  Skin self-exam. / Monthly.  Influenza vaccine. / Every year.  Tetanus, diphtheria, and acellular pertussis (Tdap/Td) vaccine.** / 1 dose of Td every 10 years.  Varicella vaccine.** / Consult your health care provider.  Zoster vaccine.** / 1 dose for adults aged 34 years or older.  Pneumococcal 13-valent conjugate (PCV13) vaccine.** / Consult your health care provider.  Pneumococcal polysaccharide (PPSV23) vaccine.** / 1 dose for all adults aged 63 years and older.  Meningococcal vaccine.** / Consult your health care provider.  Hepatitis A vaccine.** / Consult your health care provider.  Hepatitis B vaccine.** / Consult your health care provider.  Haemophilus influenzae type b (Hib) vaccine.** / Consult your health care provider. **Family history and personal history of risk and conditions may change your health care provider's recommendations. Document Released: 03/21/2001 Document Revised: 01/28/2013 Document Reviewed: 06/20/2010 [facility] Patient Information 2015 [place], [place]. This information is not intended to replace advice given to you by your health care provider. Make sure you discuss any questions you have with your health care provider.

## 2013-10-29 ENCOUNTER — Telehealth: Payer: Self-pay | Admitting: Family Medicine

## 2013-10-29 NOTE — Telephone Encounter (Signed)
Still having the leg pain wake him up in the middle of the night, it now is happening to his right leg. It is waking him up from a dead sleep. Is there a vitamin he can take to help? Or something else? Please advise.  Requesting lab results

## 2013-10-30 NOTE — Telephone Encounter (Signed)
MSG left on VM for a return call.     KP 

## 2013-10-30 NOTE — Telephone Encounter (Signed)
Try calcium  With magnesium, tonic water ( quinine)

## 2013-10-30 NOTE — Telephone Encounter (Signed)
Patient has been made aware and voiced understanding.     KP 

## 2013-11-05 ENCOUNTER — Other Ambulatory Visit: Payer: Self-pay

## 2013-11-05 MED ORDER — SIMVASTATIN 20 MG PO TABS: 20.0000 mg | ORAL_TABLET | Freq: Every day | ORAL | Status: DC

## 2014-01-07 ENCOUNTER — Other Ambulatory Visit: Payer: Self-pay | Admitting: Family Medicine

## 2014-02-01 ENCOUNTER — Emergency Department (HOSPITAL_BASED_OUTPATIENT_CLINIC_OR_DEPARTMENT_OTHER)
Admission: EM | Admit: 2014-02-01 | Discharge: 2014-02-01 | Disposition: A | Discharge status: Home / Self Care | Payer: No Typology Code available for payment source | Attending: Emergency Medicine | Admitting: Emergency Medicine

## 2014-02-01 ENCOUNTER — Encounter (HOSPITAL_BASED_OUTPATIENT_CLINIC_OR_DEPARTMENT_OTHER): Payer: Self-pay | Admitting: *Deleted

## 2014-02-01 DIAGNOSIS — Z23 Encounter for immunization: Secondary | ICD-10-CM | POA: Diagnosis not present

## 2014-02-01 DIAGNOSIS — Y9389 Activity, other specified: Secondary | ICD-10-CM | POA: Insufficient documentation

## 2014-02-01 DIAGNOSIS — Z79899 Other long term (current) drug therapy: Secondary | ICD-10-CM | POA: Diagnosis not present

## 2014-02-01 DIAGNOSIS — Y998 Other external cause status: Secondary | ICD-10-CM | POA: Diagnosis not present

## 2014-02-01 DIAGNOSIS — W260XXA Contact with knife, initial encounter: Secondary | ICD-10-CM | POA: Diagnosis not present

## 2014-02-01 DIAGNOSIS — I10 Essential (primary) hypertension: Secondary | ICD-10-CM | POA: Insufficient documentation

## 2014-02-01 DIAGNOSIS — Z791 Long term (current) use of non-steroidal anti-inflammatories (NSAID): Secondary | ICD-10-CM | POA: Insufficient documentation

## 2014-02-01 DIAGNOSIS — S61012A Laceration without foreign body of left thumb without damage to nail, initial encounter: Secondary | ICD-10-CM | POA: Diagnosis present

## 2014-02-01 DIAGNOSIS — Y9289 Other specified places as the place of occurrence of the external cause: Secondary | ICD-10-CM | POA: Diagnosis not present

## 2014-02-01 DIAGNOSIS — S61211A Laceration without foreign body of left index finger without damage to nail, initial encounter: Secondary | ICD-10-CM | POA: Insufficient documentation

## 2014-02-01 DIAGNOSIS — S61412A Laceration without foreign body of left hand, initial encounter: Secondary | ICD-10-CM

## 2014-02-01 MED ORDER — LIDOCAINE HCL (PF) 1 % IJ SOLN: 5.0000 mL | Freq: Once | INTRAMUSCULAR | Status: DC

## 2014-02-01 MED ORDER — LIDOCAINE HCL (PF) 2 % IJ SOLN
10.0000 mL | Freq: Once | INTRAMUSCULAR | Status: AC
  Administered 2014-02-01: 5 mL via INTRADERMAL

## 2014-02-01 MED ORDER — LIDOCAINE HCL (PF) 2 % IJ SOLN
INTRAMUSCULAR | Status: AC
  Filled 2014-02-01: qty 2

## 2014-02-01 MED ORDER — TETANUS-DIPHTH-ACELL PERTUSSIS 5-2.5-18.5 LF-MCG/0.5 IM SUSP
0.5000 mL | Freq: Once | INTRAMUSCULAR | Status: AC
  Administered 2014-02-01: 0.5 mL via INTRAMUSCULAR
  Filled 2014-02-01: qty 0.5

## 2014-02-01 NOTE — ED Provider Notes (Signed)
CSN: [id number]     Arrival date & time 02/01/14  1443 History   First MD Initiated Contact with Patient 02/01/14 1547     Chief Complaint  Patient presents with  . Extremity Laceration     (Consider location/radiation/quality/duration/timing/severity/associated sxs/prior Treatment) The history is provided by the patient and medical records.    38 year old male with past medical history of hypertension, presenting to the ED for left hand  Laceration. Patient states he was cutting the tags off of a new fishing pole with a new pocket knife, knife slipped and accidentally stabbed his hand in between his thumb and index finger. States knife blade did not go in deep.  Bleeding well controlled on arrival.  Denies numbness/paresthesias of fingers.  Date of last tetanus unknown.  Patient is right hand dominant.  Past Medical History  Diagnosis Date  . Hypertension    History reviewed. No pertinent past surgical history. Family History  Problem Relation Age of Onset  . Heart disease Father 2556    MI  . Hypertension Father   . Sudden death Father     Heart disease  . Diabetes Mother     Borderline  . Hyperlipidemia Mother   . Hypertension Mother   . Breast cancer Maternal Grandmother    History  Substance Use Topics  . Smoking status: Never Smoker   . Smokeless tobacco: Never Used  . Alcohol Use: Yes    Review of Systems  Skin: Positive for wound.  All other systems reviewed and are negative.     Allergies  Review of patient's allergies indicates no known allergies.  Home Medications   Prior to Admission medications   Medication Sig Start Date End Date Taking? Authorizing Provider  ibuprofen (ADVIL,MOTRIN) 200 MG tablet Take 600-800 mg by mouth daily as needed. For pain    Historical Provider, MD  lisinopril-hydrochlorothiazide (PRINZIDE,ZESTORETIC) 20-12.5 MG per tablet TAKE 2 TABLETS BY MOUTH DAILY. 01/08/14   Lelon PerlaYvonne R Lowne, DO  simvastatin (ZOCOR) 20 MG tablet Take 1  tablet (20 mg total) by mouth at bedtime. 11/05/13   Yvonne R Lowne, DO   BP 154/109 mmHg  Pulse 85  Temp(Src) 98.6 F (37 C) (Oral)  Resp 18  Ht 5\' 11"  (1.803 m)  Wt 375 lb (170.099 kg)  BMI 52.33 kg/m2  SpO2 99%   Physical Exam  Constitutional: He is oriented to person, place, and time. He appears well-developed and well-nourished. No distress.  HENT:  Head: Normocephalic and atraumatic.  Mouth/Throat: Oropharynx is clear and moist.  Eyes: Conjunctivae and EOM are normal. Pupils are equal, round, and reactive to light.  Neck: Normal range of motion. Neck supple.  Cardiovascular: Normal rate, regular rhythm and normal heart sounds.   Pulmonary/Chest: Effort normal and breath sounds normal. No respiratory distress. He has no wheezes.  Musculoskeletal: Normal range of motion.       Left hand: He exhibits laceration. He exhibits normal range of motion, no tenderness, no bony tenderness, normal capillary refill and no deformity. Normal sensation noted. Normal strength noted.       Hands: Left hand with 1cm rather superficial laceration between thumb and index finger; no evidence of deep tissue or tendon involvement; full flexion/extension of all fingers without difficulty; normal sensation throughout all fingers; strong radial pulse and cap refill  Neurological: He is alert and oriented to person, place, and time.  Skin: Skin is warm and dry. He is not diaphoretic.  Psychiatric: He has a normal mood and  affect.  Nursing note and vitals reviewed.   ED Course  Procedures (including critical care time)  LACERATION REPAIR Performed by: Garlon HatchetSANDERS,  M Authorized by: Garlon HatchetSANDERS,  M Consent: Verbal consent obtained. Risks and benefits: risks, benefits and alternatives were discussed Consent given by: patient Patient identity confirmed: provided demographic data Prepped and Draped in normal sterile fashion Wound explored  Laceration Location: webbed space between left thumb and  index finger  Laceration Length: 1 cm  No Foreign Bodies seen or palpated  Anesthesia: local infiltration  Local anesthetic: lidocaine 2% without epinephrine  Anesthetic total: 5 ml  Irrigation method: syringe Amount of cleaning: standard  Skin closure: 4-0 prolene  Number of sutures: 2  Technique: simple interrupted.  Patient tolerance: Patient tolerated the procedure well with no immediate complications.  Labs Review Labs Reviewed - No data to display  Imaging Review No results found.   EKG Interpretation None      MDM   Final diagnoses:  Hand laceration, left, initial encounter   38 year old male with left hand laceration due to pocketknife. He reports pocketknife was new and clean. On exam, rather superficial laceration of webspace between left thumb and index finger. There is no evidence of deep tissue, vessel, or tendon involvement. Patient has normal range of motion of all fingers and hand is neurovascularly intact. Tetanus was updated. Laceration repaired as above, patient tolerated well. He will follow with his primary care physician in 5-7 days for suture removal.  Discussed plan with patient, he/she acknowledged understanding and agreed with plan of care.  Return precautions given for new or worsening symptoms.   Garlon HatchetLisa M , PA-C 02/01/14 1659  Mirian MoMatthew Gentry, MD 02/07/14 [phone]

## 2014-02-01 NOTE — Discharge Instructions (Signed)
Keep sutures clean and dry.  Recommend keeping them covered while working, may leave exposed at home. Follow-up with your physician in 5-7 days for suture removal. Return to the ED for new concerns or signs of infection (redness, swelling, drainage, etc.)

## 2014-02-01 NOTE — ED Notes (Addendum)
Pt has a lac to left hand, between thumb and index finger. Bleeding controlled at this time.

## 2014-02-07 ENCOUNTER — Other Ambulatory Visit: Payer: Self-pay | Admitting: Family Medicine

## 2014-02-09 ENCOUNTER — Ambulatory Visit (INDEPENDENT_AMBULATORY_CARE_PROVIDER_SITE_OTHER): Payer: No Typology Code available for payment source | Admitting: Medical

## 2014-02-09 ENCOUNTER — Encounter: Payer: Self-pay | Admitting: Medical

## 2014-02-09 VITALS — BP 150/96 | HR 83 | Temp 98.7°F | Ht 70.5 in | Wt 395.6 lb

## 2014-02-09 DIAGNOSIS — J0100 Acute maxillary sinusitis, unspecified: Secondary | ICD-10-CM | POA: Insufficient documentation

## 2014-02-09 DIAGNOSIS — I10 Essential (primary) hypertension: Secondary | ICD-10-CM

## 2014-02-09 DIAGNOSIS — L089 Local infection of the skin and subcutaneous tissue, unspecified: Secondary | ICD-10-CM | POA: Insufficient documentation

## 2014-02-09 DIAGNOSIS — T148 Other injury of unspecified body region: Secondary | ICD-10-CM

## 2014-02-09 DIAGNOSIS — T148XXA Other injury of unspecified body region, initial encounter: Principal | ICD-10-CM

## 2014-02-09 MED ORDER — DOXYCYCLINE HYCLATE 100 MG PO TABS: 100.0000 mg | ORAL_TABLET | Freq: Two times a day (BID) | ORAL | Status: DC

## 2014-02-09 MED ORDER — BENZONATATE 100 MG PO CAPS: 100.0000 mg | ORAL_CAPSULE | Freq: Three times a day (TID) | ORAL | Status: DC | PRN

## 2014-02-09 MED ORDER — FLUTICASONE PROPIONATE 50 MCG/ACT NA SUSP: 2.0000 | Freq: Every day | NASAL | Status: DC

## 2014-02-09 NOTE — Assessment & Plan Note (Signed)
Regarding your blood pressure. Check bp daily next 10 days and follow up in 10-14 days for your bp. You may need adjustment of meds.

## 2014-02-09 NOTE — Patient Instructions (Addendum)
Your appear to have a sinus infection. I am prescribing doxycycline  antibiotic for the infection. To help with the nasal congestion I prescribed  flonase nasal steroid. For your associated cough, I prescribed cough medicine benzonatate.  You appear to have none healing left hand laceration with probable infection and possible pocket of abscess. Sutures not removed today. Got wound culture today and will refer you to hand surgeon. Will try to get you in by Thursday of this week but if area worsens before then let us know.  Regarding your blood pressure. Check bp daily next 10 days and follow up in 10-14 days for your bp. You may need adjustment of meds on follow up.    Rest, hydrate, tylenol for fever.  Follow up in 7 days or as needed.  Note local hand specialist would not see pt due to his insurance. So he will come back in on Thursday to recheck wound. At that point the sutures will be about 45 days old. They would need to be removed most likley. If the area still does not look like healing appropriately  then would try to refer to out of town hand specialist.

## 2014-02-09 NOTE — Telephone Encounter (Signed)
Refill request for Zocor sent.

## 2014-02-09 NOTE — Assessment & Plan Note (Signed)
Your appear to have a sinus infection. I am prescribing doxycycline  antibiotic for the infection. To help with the nasal congestion I prescribed  flonase nasal steroid. For your associated cough, I prescribed cough medicine benzonatate.

## 2014-02-09 NOTE — Assessment & Plan Note (Signed)
You appear to have none healing left hand laceration with probable infection and possible pocket of abscess. Sutures not removed today. Got wound culture today and will refer you to hand surgeon. Will try to get you in by Thursday of this week but if area worsens before then let us know.

## 2014-02-09 NOTE — Progress Notes (Signed)
Subjective:    Patient ID: Sean [name], male    DOB: 1975-09-04, 39 y.o.   MRN: [id number]  HPI  Pt in and got left hand laceration about 88 days old. The area is some drainage. Pt states around the edge of the wound he has some pain. Pt states the area is inflamed. No fevers, no chills. No sweats. Pt has worn a brace at work open air at night. Pt was given topical ointment but nor oral medications. Pt not diabetic.  Also some cough, congestion and runny nose since last Friday.  Sinus pressures.  Past Medical History  Diagnosis Date  . Hypertension     History   Social History  . Marital Status: Single    Spouse Name: N/A    Number of Children: N/A  . Years of Education: N/A   Occupational History  . Not on file.   Social History Main Topics  . Smoking status: Never Smoker   . Smokeless tobacco: Never Used  . Alcohol Use: Yes  . Drug Use: No  . Sexual Activity: Yes   Other Topics Concern  . Not on file   Social History Narrative    No past surgical history on file.  Family History  Problem Relation Age of Onset  . Heart disease Father 24    MI  . Hypertension Father   . Sudden death Father     Heart disease  . Diabetes Mother     Borderline  . Hyperlipidemia Mother   . Hypertension Mother   . Breast cancer Maternal Grandmother     No Known Allergies  Current Outpatient Prescriptions on File Prior to Visit  Medication Sig Dispense Refill  . ibuprofen (ADVIL,MOTRIN) 200 MG tablet Take 600-800 mg by mouth daily as needed. For pain    . lisinopril-hydrochlorothiazide (PRINZIDE,ZESTORETIC) 20-12.5 MG per tablet TAKE 2 TABLETS BY MOUTH DAILY. 60 tablet 5  . simvastatin (ZOCOR) 20 MG tablet TAKE 1 TABLET (20 MG TOTAL) BY MOUTH AT BEDTIME. 30 tablet 2   No current facility-administered medications on file prior to visit.    BP 150/96 mmHg  Pulse 83  Temp(Src) 98.7 F (37.1 C) (Oral)  Ht 5' 10.5" (1.791 m)  Wt 395 lb 9.6 oz (179.443 kg)  BMI 55.94  kg/m2  SpO2 95%       Review of Systems  Constitutional: Negative for fever, chills and fatigue.  HENT: Positive for congestion and rhinorrhea. Negative for sore throat.   Respiratory: Positive for cough. Negative for chest tightness and wheezing.   Cardiovascular: Negative for chest pain and palpitations.  Gastrointestinal: Negative for abdominal pain.  Musculoskeletal: Negative for back pain.  Skin:       A little soreness around the wound. Wound not healing well. Some yellow discharge recently.  Neurological: Negative for dizziness, seizures, weakness, numbness and headaches.  Hematological: Negative for adenopathy.       Objective:   Physical Exam   General  Mental Status - Alert. General Appearance - Well groomed. Not in acute distress.  Skin Rashes- No Rashes.  HEENT Head- Normal. Ear Auditory Canal - Left- Normal. Right - Normal.Tympanic Membrane- Left- Normal. Right- Normal. Eye Sclera/Conjunctiva- Left- Normal. Right- Normal. Nose & Sinuses Nasal Mucosa- Left-  Boggy + Congested. Right-    boggy + Congested. Maxillary sinus pressure Mouth & Throat Lips: Upper Lip- Normal: no dryness, cracking, pallor, cyanosis, or vesicular eruption. Lower Lip-Normal: no dryness, cracking, pallor, cyanosis or vesicular eruption. Buccal Mucosa-  Bilateral- No Aphthous ulcers. Oropharynx- No Discharge or Erythema. Tonsils: Characteristics- Bilateral- No Erythema or Congestion. Size/Enlargement- Bilateral- No enlargement. Discharge- bilateral-None.  Neck Neck- Supple. No Masses.   Chest and Lung Exam Auscultation: Breath Sounds:- even and unlabored, but bilateral upper lobe rhonchi.  Cardiovascular Auscultation:Rythm- Regular, rate and rhythm. Murmurs & Other Heart Sounds:Ausculatation of the heart reveal- No Murmurs.  Lymphatic Head & Neck General Head & Neck Lymphatics: Bilateral: Description- No Localized lymphadenopathy.  Neurologic CN III-XII grossly intact. Neg  romberg.  Lt hand- between 1st and 2nd digit 10 mm laceration 2 sutures in place but wound is not healing. Indurated and very tender around the edge of wound. Edges of wound are not opposed. Pulse intact, good capillary refill. No ascending lymphadenopathy.         Assessment & Plan:

## 2014-02-09 NOTE — Progress Notes (Signed)
Pre visit review using our clinic review tool, if applicable. No additional management support is needed unless otherwise documented below in the visit note. 

## 2014-02-12 ENCOUNTER — Ambulatory Visit (INDEPENDENT_AMBULATORY_CARE_PROVIDER_SITE_OTHER): Payer: No Typology Code available for payment source | Admitting: Medical

## 2014-02-12 ENCOUNTER — Encounter: Payer: Self-pay | Admitting: Medical

## 2014-02-12 VITALS — BP 147/91 | HR 93 | Temp 99.1°F | Ht 70.0 in | Wt 395.6 lb

## 2014-02-12 DIAGNOSIS — T148XXA Other injury of unspecified body region, initial encounter: Principal | ICD-10-CM

## 2014-02-12 DIAGNOSIS — T8189XD Other complications of procedures, not elsewhere classified, subsequent encounter: Secondary | ICD-10-CM

## 2014-02-12 DIAGNOSIS — L089 Local infection of the skin and subcutaneous tissue, unspecified: Secondary | ICD-10-CM

## 2014-02-12 DIAGNOSIS — T148 Other injury of unspecified body region: Secondary | ICD-10-CM

## 2014-02-12 LAB — WOUND CULTURE
Gram Stain: NONE SEEN
Gram Stain: NONE SEEN

## 2014-02-12 MED ORDER — SULFAMETHOXAZOLE-TRIMETHOPRIM 800-160 MG PO TABS: 1.0000 | ORAL_TABLET | Freq: Two times a day (BID) | ORAL | Status: DC

## 2014-02-12 NOTE — Assessment & Plan Note (Signed)
You have staph on culture. At this stage I do not think sutures are doing much and will be more difficult to remove if we delay further. So I removed the sutures.   I am adding bactrim ds today and continue the doxycycline.  I am going to try to refer you to another hand surgeon and may also try to get you into wound care.  Follow up Tuesday next week if we are unsuccessful getting you in. If worsening or changing symptoms then ED eval. 

## 2014-02-12 NOTE — Progress Notes (Signed)
Subjective:    Patient ID: Sean [name], male    DOB: Mar 07, 1975, 39 y.o.   MRN: [id number]  HPI   Pt in for follow up. The area does not feel as inflamed as before and not as painful. No fever, no chills. Culture came back showing abundant staph. This may not be final culture result. In end may show mrsa?  I tried to get patient in with hand specialist but his insurance not accepted. So I wanted him to follow up here today  Per pt will be day 10 days today since sutures placed  Past Medical History  Diagnosis Date  . Hypertension     History   Social History  . Marital Status: Single    Spouse Name: N/A    Number of Children: N/A  . Years of Education: N/A   Occupational History  . Not on file.   Social History Main Topics  . Smoking status: Never Smoker   . Smokeless tobacco: Never Used  . Alcohol Use: Yes  . Drug Use: No  . Sexual Activity: Yes   Other Topics Concern  . Not on file   Social History Narrative    No past surgical history on file.  Family History  Problem Relation Age of Onset  . Heart disease Father 4956    MI  . Hypertension Father   . Sudden death Father     Heart disease  . Diabetes Mother     Borderline  . Hyperlipidemia Mother   . Hypertension Mother   . Breast cancer Maternal Grandmother     No Known Allergies  Current Outpatient Prescriptions on File Prior to Visit  Medication Sig Dispense Refill  . benzonatate (TESSALON) 100 MG capsule Take 1 capsule (100 mg total) by mouth 3 (three) times daily as needed. 21 capsule 0  . doxycycline (VIBRA-TABS) 100 MG tablet Take 1 tablet (100 mg total) by mouth 2 (two) times daily. 20 tablet 0  . fluticasone (FLONASE) 50 MCG/ACT nasal spray Place 2 sprays into both nostrils daily. 16 g 1  . ibuprofen (ADVIL,MOTRIN) 200 MG tablet Take 600-800 mg by mouth daily as needed. For pain    . lisinopril-hydrochlorothiazide (PRINZIDE,ZESTORETIC) 20-12.5 MG per tablet TAKE 2 TABLETS BY MOUTH DAILY.  60 tablet 5  . simvastatin (ZOCOR) 20 MG tablet TAKE 1 TABLET (20 MG TOTAL) BY MOUTH AT BEDTIME. 30 tablet 2   No current facility-administered medications on file prior to visit.    BP 147/91 mmHg  Pulse 93  Temp(Src) 99.1 F (37.3 C) (Oral)  Ht 5\' 10"  (1.778 m)  Wt 395 lb 9.6 oz (179.443 kg)  BMI 56.76 kg/m2  SpO2 95%      Review of Systems  Constitutional: Negative for fever, chills and fatigue.  Respiratory: Negative for cough, shortness of breath and wheezing.   Cardiovascular: Negative for chest pain and palpitations.  Musculoskeletal:       Lt hand pain. At base of thumb between 1st and second digit.  Neurological: Negative for weakness and numbness.  Hematological: Negative for adenopathy.       Objective:   Physical Exam     General - No acute distress and pleasant.  Lt hand/upper ext- hand showed still swollen area near laceration line. Less tender but moderate tender now. No discharge. Edges of wound not opposed well. The way sutures are placed they don't appear to be holding edges together. Pt can flex and extend his hand/digits but with pain  at thumb  Removed sutures and applied pressure.(curent wound edges were stable).  No tracking up hid arm. No lymphadenopathy. No redness, no warm or tenderness.        Assessment & Plan:

## 2014-02-12 NOTE — Progress Notes (Signed)
Pre visit review using our clinic review tool, if applicable. No additional management support is needed unless otherwise documented below in the visit note. 

## 2014-02-12 NOTE — Patient Instructions (Signed)
You have staph on culture. At this stage I do not think sutures are doing much and will be more difficult to remove if we delay further. So I removed the sutures.   I am adding bactrim ds today and continue the doxycycline.  I am going to try to refer you to another hand surgeon and may also try to get you into wound care.  Follow up Tuesday next week if we are unsuccessful getting you in. If worsening or changing symptoms then ED eval.

## 2014-02-17 ENCOUNTER — Ambulatory Visit: Payer: No Typology Code available for payment source | Admitting: Medical

## 2014-02-18 ENCOUNTER — Encounter (HOSPITAL_BASED_OUTPATIENT_CLINIC_OR_DEPARTMENT_OTHER): Payer: No Typology Code available for payment source | Attending: General Surgery

## 2014-02-19 ENCOUNTER — Encounter: Payer: Self-pay | Admitting: Family Medicine

## 2014-02-19 ENCOUNTER — Ambulatory Visit (INDEPENDENT_AMBULATORY_CARE_PROVIDER_SITE_OTHER): Payer: No Typology Code available for payment source | Admitting: Family Medicine

## 2014-02-19 VITALS — BP 144/96 | HR 79 | Temp 98.7°F | Wt 398.0 lb

## 2014-02-19 DIAGNOSIS — I10 Essential (primary) hypertension: Secondary | ICD-10-CM

## 2014-02-19 DIAGNOSIS — S61412D Laceration without foreign body of left hand, subsequent encounter: Secondary | ICD-10-CM

## 2014-02-19 DIAGNOSIS — E785 Hyperlipidemia, unspecified: Secondary | ICD-10-CM

## 2014-02-19 LAB — BASIC METABOLIC PANEL
BUN: 14 mg/dL (ref 6–23)
CALCIUM: 9.6 mg/dL (ref 8.4–10.5)
CHLORIDE: 102 meq/L (ref 96–112)
CO2: 30 mEq/L (ref 19–32)
CREATININE: 0.94 mg/dL (ref 0.40–1.50)
GFR: 94.89 mL/min (ref >60.00)
GLUCOSE: 85 mg/dL (ref 70–99)
Potassium: 3.9 mEq/L (ref 3.5–5.1)
Sodium: 137 mEq/L (ref 135–145)

## 2014-02-19 LAB — LIPID PANEL
CHOLESTEROL: 205 mg/dL — AB (ref 0–200)
HDL: 36.3 mg/dL — ABNORMAL LOW (ref >39.00)
LDL Cholesterol: 130 mg/dL — ABNORMAL HIGH (ref 0–99)
NonHDL: 168.7
TRIGLYCERIDES: 195 mg/dL — AB (ref 0.0–149.0)
Total CHOL/HDL Ratio: 6
VLDL: 39 mg/dL (ref 0.0–40.0)

## 2014-02-19 LAB — CBC WITH DIFFERENTIAL/PLATELET
BASOS ABS: 0.1 10*3/uL (ref 0.0–0.1)
BASOS PCT: 0.7 % (ref 0.0–3.0)
EOS ABS: 0.4 10*3/uL (ref 0.0–0.7)
Eosinophils Relative: 4.6 % (ref 0.0–5.0)
HCT: 47.3 % (ref 39.0–52.0)
Hemoglobin: 15.7 g/dL (ref 13.0–17.0)
LYMPHS ABS: 3.1 10*3/uL (ref 0.7–4.0)
LYMPHS PCT: 32.8 % (ref 12.0–46.0)
MCHC: 33.3 g/dL (ref 30.0–36.0)
MCV: 84.8 fl (ref 78.0–100.0)
MONO ABS: 0.9 10*3/uL (ref 0.1–1.0)
Monocytes Relative: 8.9 % (ref 3.0–12.0)
NEUTROS PCT: 53 % (ref 43.0–77.0)
Neutro Abs: 5.1 10*3/uL (ref 1.4–7.7)
Platelets: 286 10*3/uL (ref 150.0–400.0)
RBC: 5.57 Mil/uL (ref 4.22–5.81)
RDW: 13.8 % (ref 11.5–15.5)
WBC: 9.6 10*3/uL (ref 4.0–10.5)

## 2014-02-19 LAB — HEPATIC FUNCTION PANEL
ALK PHOS: 55 U/L (ref 39–117)
ALT: 37 U/L (ref 0–53)
AST: 24 U/L (ref 0–37)
Albumin: 4.4 g/dL (ref 3.5–5.2)
BILIRUBIN TOTAL: 0.5 mg/dL (ref 0.2–1.2)
Bilirubin, Direct: 0.1 mg/dL (ref 0.0–0.3)
Total Protein: 7.6 g/dL (ref 6.0–8.3)

## 2014-02-19 MED ORDER — LISINOPRIL 40 MG PO TABS: 40.0000 mg | ORAL_TABLET | Freq: Every day | ORAL | Status: DC

## 2014-02-19 NOTE — Progress Notes (Signed)
Pre visit review using our clinic review tool, if applicable. No additional management support is needed unless otherwise documented below in the visit note. 

## 2014-02-19 NOTE — Patient Instructions (Signed)

## 2014-02-19 NOTE — Addendum Note (Signed)
Addended by: Lelon PerlaLOWNE,  R on: 02/19/2014 10:48 AM   Modules accepted: Orders

## 2014-02-19 NOTE — Progress Notes (Signed)
  Subjective:    Patient here for follow-up of elevated blood pressure cholesterol and recheck hand.    He is not exercising and is adherent to a low-salt diet.  Blood pressure is not well controlled at home. Cardiac symptoms: none. Patient denies: chest pain, chest pressure/discomfort, claudication, dyspnea, exertional chest pressure/discomfort, fatigue, irregular heart beat, lower extremity edema, near-syncope, orthopnea, palpitations, paroxysmal nocturnal dyspnea, syncope and tachypnea. Cardiovascular risk factors: dyslipidemia, hypertension, male gender, obesity (BMI >= 30 kg/m2) and sedentary lifestyle. Use of agents associated with hypertension: none. History of target organ damage: none.  The following portions of the patient's history were reviewed and updated as appropriate: allergies, current medications, past family history, past medical history, past social history, past surgical history and problem list.  Review of Systems Pertinent items are noted in HPI.     Objective:    BP 144/96 mmHg  Pulse 79  Temp(Src) 98.7 F (37.1 C) (Oral)  Wt 398 lb (180.532 kg)  SpO2 97% General appearance: alert, cooperative, appears stated age and no distress Neck: no adenopathy, no carotid bruit, no JVD, supple, symmetrical, trachea midline and thyroid not enlarged, symmetric, no tenderness/mass/nodules Lungs: clear to auscultation bilaterally Heart: S1, S2 normal Extremities: extremities normal, atraumatic, no cyanosis or edema   L hand-- laceration healing well Assessment:    Hypertension, uncontrolled . Evidence of target organ damage: none.    Plan:    Medication: lisinopril 40 mg . Dietary sodium restriction. Regular aerobic exercise. Follow up: 3 months and as needed.    1. Essential hypertension unstable - lisinopril (PRINIVIL,ZESTRIL) 40 MG tablet; Take 1 tablet (40 mg total) by mouth daily.  Dispense: 90 tablet; Refill: 3  2. Hyperlipidemia Check labs con't simvastatin    3. Laceration of hand, left, subsequent encounter Healing well

## 2014-02-26 ENCOUNTER — Encounter: Payer: Self-pay | Admitting: *Deleted

## 2014-02-26 MED ORDER — SIMVASTATIN 40 MG PO TABS: 40.0000 mg | ORAL_TABLET | Freq: Every day | ORAL | Status: AC

## 2014-02-26 NOTE — Addendum Note (Signed)
Addended by: Noreene LarssonLARSON,  A on: 02/26/2014 03:17 PM   Modules accepted: Orders, Medications

## 2014-03-18 ENCOUNTER — Telehealth: Payer: Self-pay | Admitting: Family Medicine

## 2014-03-18 NOTE — Telephone Encounter (Signed)
Caller name: Dheeraj Relation to pt: self Call back number: [phone] Pharmacy: [facility] on [address]   Reason for call:   Patient stating that his sinus issues have come back from last visit and would like to know what Dr. Laury AxonLowne recommends he do. Has cough,runny nose,and achy. Has taken nyquil but that is not working. No fever.

## 2014-03-18 NOTE — Telephone Encounter (Signed)
Its been over a month since he has been here--- we won't rx abx over phone.  If Sean PeersUri symptoms are one the things he can do e visit with he can do that through my chart.   He can get antihistamine and flonase or nasacort---otherwise he would have to come in

## 2014-03-18 NOTE — Telephone Encounter (Signed)
Pt notified and made aware.  He agrees with plan. No further needs at this time.

## 2014-03-18 NOTE — Telephone Encounter (Addendum)
Pt states he has been here for the past 4 weeks dealing with his hand and would rather not come in for another appointment.  Pt stated has already tried Coricidin, Nyquil, and honey and vinegar.  Neither has worked.  Pt denies a fever, shortness of breath, chest tightness.  Just a cough, runny nose.  He was advised to take Mucinex or Mucinex DM, Zyrtec or Claritin, normal saline nasal spray for nasal congestion.  He was encouraged to drink plenty of water.  He was also advised if no improvement in 1 weeks or so, to call back to schedule an appointment.  He stated understanding and agreed with plan.

## 2014-03-18 NOTE — Telephone Encounter (Signed)
Left a message for call back.  

## 2014-04-30 ENCOUNTER — Ambulatory Visit: Payer: PRIVATE HEALTH INSURANCE | Admitting: Family Medicine

## 2014-05-25 ENCOUNTER — Ambulatory Visit: Payer: No Typology Code available for payment source | Admitting: Family Medicine

## 2014-05-25 DIAGNOSIS — Z0289 Encounter for other administrative examinations: Secondary | ICD-10-CM

## 2014-09-04 ENCOUNTER — Emergency Department (HOSPITAL_BASED_OUTPATIENT_CLINIC_OR_DEPARTMENT_OTHER)
Admission: EM | Admit: 2014-09-04 | Discharge: 2014-09-04 | Disposition: A | Discharge status: Home / Self Care | Payer: No Typology Code available for payment source | Attending: Emergency Medicine | Admitting: Emergency Medicine

## 2014-09-04 ENCOUNTER — Encounter (HOSPITAL_BASED_OUTPATIENT_CLINIC_OR_DEPARTMENT_OTHER): Payer: Self-pay | Admitting: *Deleted

## 2014-09-04 DIAGNOSIS — Z79899 Other long term (current) drug therapy: Secondary | ICD-10-CM | POA: Insufficient documentation

## 2014-09-04 DIAGNOSIS — Z792 Long term (current) use of antibiotics: Secondary | ICD-10-CM | POA: Insufficient documentation

## 2014-09-04 DIAGNOSIS — Z7951 Long term (current) use of inhaled steroids: Secondary | ICD-10-CM | POA: Insufficient documentation

## 2014-09-04 DIAGNOSIS — L03311 Cellulitis of abdominal wall: Secondary | ICD-10-CM | POA: Insufficient documentation

## 2014-09-04 DIAGNOSIS — L0390 Cellulitis, unspecified: Secondary | ICD-10-CM

## 2014-09-04 DIAGNOSIS — L0291 Cutaneous abscess, unspecified: Secondary | ICD-10-CM

## 2014-09-04 DIAGNOSIS — I10 Essential (primary) hypertension: Secondary | ICD-10-CM | POA: Insufficient documentation

## 2014-09-04 DIAGNOSIS — L02211 Cutaneous abscess of abdominal wall: Secondary | ICD-10-CM | POA: Insufficient documentation

## 2014-09-04 LAB — CBG MONITORING, ED: Glucose-Capillary: 87 mg/dL (ref 65–99)

## 2014-09-04 MED ORDER — IBUPROFEN 800 MG PO TABS: 800.0000 mg | ORAL_TABLET | Freq: Three times a day (TID) | ORAL | Status: DC

## 2014-09-04 MED ORDER — MICONAZOLE NITRATE 2 % EX CREA: 1.0000 "application " | TOPICAL_CREAM | Freq: Two times a day (BID) | CUTANEOUS | Status: DC

## 2014-09-04 MED ORDER — HYDROCODONE-ACETAMINOPHEN 5-325 MG PO TABS: 1.0000 | ORAL_TABLET | ORAL | Status: DC | PRN

## 2014-09-04 MED ORDER — DOXYCYCLINE HYCLATE 100 MG PO TABS
100.0000 mg | ORAL_TABLET | Freq: Once | ORAL | Status: AC
  Administered 2014-09-04: 100 mg via ORAL
  Filled 2014-09-04: qty 1

## 2014-09-04 MED ORDER — LIDOCAINE-EPINEPHRINE (PF) 2 %-1:200000 IJ SOLN
5.0000 mL | Freq: Once | INTRAMUSCULAR | Status: AC
  Administered 2014-09-04: 5 mL
  Filled 2014-09-04: qty 10

## 2014-09-04 MED ORDER — CLINDAMYCIN HCL 300 MG PO CAPS: 300.0000 mg | ORAL_CAPSULE | Freq: Four times a day (QID) | ORAL | Status: DC

## 2014-09-04 NOTE — Discharge Instructions (Signed)

## 2014-09-04 NOTE — ED Notes (Signed)
Abscess on his lower left abdomen and pubic region.

## 2014-09-04 NOTE — ED Provider Notes (Signed)
CSN: [id number]     Arrival date & time 09/04/14  1404 History   First MD Initiated Contact with Patient 09/04/14 1510     Chief Complaint  Patient presents with  . Abscess     (Consider location/radiation/quality/duration/timing/severity/associated sxs/prior Treatment) HPI Patient reports a history of abscesses that typically spontaneously resolve with some compresses. Now however for 4 days he has had a developing nodule in his suprapubic region that has become very painful and red. As of last night it kept him awake most of the night. It is made worse by walking and movements. No associated fevers, chills or myalgia. Patient denies history diabetes. Past Medical History  Diagnosis Date  . Hypertension    History reviewed. No pertinent past surgical history. Family History  Problem Relation Age of Onset  . Heart disease Father 20    MI  . Hypertension Father   . Sudden death Father     Heart disease  . Diabetes Mother     Borderline  . Hyperlipidemia Mother   . Hypertension Mother   . Breast cancer Maternal Grandmother    History  Substance Use Topics  . Smoking status: Never Smoker   . Smokeless tobacco: Never Used  . Alcohol Use: Yes    Review of Systems 10 Systems reviewed and are negative for acute change except as noted in the HPI.    Allergies  Review of patient's allergies indicates no known allergies.  Home Medications   Prior to Admission medications   Medication Sig Start Date End Date Taking? Authorizing Provider  benzonatate (TESSALON) 100 MG capsule Take 1 capsule (100 mg total) by mouth 3 (three) times daily as needed. 02/09/14   Ramon Dredge Saguier, PA-C  clindamycin (CLEOCIN) 300 MG capsule Take 1 capsule (300 mg total) by mouth 4 (four) times daily. X 7 days 09/04/14   Arby Barrette, MD  doxycycline (VIBRA-TABS) 100 MG tablet Take 1 tablet (100 mg total) by mouth 2 (two) times daily. 02/09/14   Ramon Dredge Saguier, PA-C  fluticasone (FLONASE) 50 MCG/ACT nasal  spray Place 2 sprays into both nostrils daily. 02/09/14   Ramon Dredge Saguier, PA-C  HYDROcodone-acetaminophen (NORCO/VICODIN) 5-325 MG per tablet Take 1-2 tablets by mouth every 4 (four) hours as needed for moderate pain or severe pain. 09/04/14   Arby Barrette, MD  ibuprofen (ADVIL,MOTRIN) 200 MG tablet Take 600-800 mg by mouth daily as needed. For pain    Historical Provider, MD  ibuprofen (ADVIL,MOTRIN) 800 MG tablet Take 1 tablet (800 mg total) by mouth 3 (three) times daily. 09/04/14   Arby Barrette, MD  lisinopril (PRINIVIL,ZESTRIL) 40 MG tablet Take 1 tablet (40 mg total) by mouth daily. 02/19/14   Lelon Perla, DO  lisinopril-hydrochlorothiazide (PRINZIDE,ZESTORETIC) 20-12.5 MG per tablet TAKE 2 TABLETS BY MOUTH DAILY. 01/08/14   Lelon Perla, DO  simvastatin (ZOCOR) 40 MG tablet Take 1 tablet (40 mg total) by mouth at bedtime. 02/26/14 02/27/15  Lelon Perla, DO  sulfamethoxazole-trimethoprim (BACTRIM DS) 800-160 MG per tablet Take 1 tablet by mouth 2 (two) times daily. 02/12/14   Edward Saguier, PA-C   BP 145/99 mmHg  Pulse 94  Temp(Src) 98.6 F (37 C) (Oral)  Resp 18  Ht 6' (1.829 m)  Wt 398 lb (180.532 kg)  BMI 53.97 kg/m2  SpO2 99% Physical Exam  Constitutional: He is oriented to person, place, and time.  Morbid obesity. Alert and nontoxic. Normal mental status. No respiratory distress. Warm and dry.  HENT:  Head: Normocephalic and  atraumatic.  Eyes: EOM are normal.  Pulmonary/Chest: Effort normal.  Musculoskeletal: Normal range of motion.  Neurological: He is alert and oriented to person, place, and time. Coordination normal.  Skin: Skin is warm and dry.  Patient has suprapubic nodule approximately 5 cm with fluctuant center. This is just left of the midline outside of the pubic hair bearing area. There is surrounding cellulitis approximately 10 cm, but not extending to the genitals. The patient also has mild intertriginous candidal rash. This is not macerated. It is present in  the pannus fold of the lower abdomen as well as in the groin folds.  Psychiatric: He has a normal mood and affect.    ED Course  INCISION AND DRAINAGE Date/Time: 09/04/2014 4:43 PM Performed by: Arby Barrette Authorized by: Arby Barrette Consent: Verbal consent obtained. Consent given by: patient Type: abscess Body area: trunk Location details: abdomen Anesthesia: local infiltration Local anesthetic: lidocaine 2% with epinephrine Anesthetic total: 2 ml Patient sedated: no Scalpel size: 11 Incision type: single straight Complexity: simple Drainage: purulent Drainage amount: copious Packing material: 1/4 in iodoform gauze   (including critical care time)  Labs Review Labs Reviewed  CBG MONITORING, ED    Imaging Review No results found.   EKG Interpretation None      MDM   Final diagnoses:  Abscess and cellulitis   Patient was an abscess of the lower abdominal wall. There is associated cellulitis. Patient is nontoxic without constitutional symptoms. I&D produced copious amounts of purulent drainage. The wound was subsequently packed. Patient is counseled for 48 hour return for packing change. He is advised if he develops fever, constitutional symptoms or increasing redness or pain in the wound, he is to return to the hospital for evaluation for admission on IV antibiotics.    Arby Barrette, MD 09/04/14 [phone]

## 2014-09-06 ENCOUNTER — Encounter (HOSPITAL_BASED_OUTPATIENT_CLINIC_OR_DEPARTMENT_OTHER): Payer: Self-pay | Admitting: *Deleted

## 2014-09-06 ENCOUNTER — Emergency Department (HOSPITAL_BASED_OUTPATIENT_CLINIC_OR_DEPARTMENT_OTHER)
Admission: EM | Admit: 2014-09-06 | Discharge: 2014-09-06 | Disposition: A | Discharge status: Home / Self Care | Payer: No Typology Code available for payment source | Attending: Emergency Medicine | Admitting: Emergency Medicine

## 2014-09-06 DIAGNOSIS — I10 Essential (primary) hypertension: Secondary | ICD-10-CM | POA: Insufficient documentation

## 2014-09-06 DIAGNOSIS — Z791 Long term (current) use of non-steroidal anti-inflammatories (NSAID): Secondary | ICD-10-CM | POA: Insufficient documentation

## 2014-09-06 DIAGNOSIS — Z5189 Encounter for other specified aftercare: Secondary | ICD-10-CM

## 2014-09-06 DIAGNOSIS — Z7951 Long term (current) use of inhaled steroids: Secondary | ICD-10-CM | POA: Insufficient documentation

## 2014-09-06 DIAGNOSIS — Z4801 Encounter for change or removal of surgical wound dressing: Secondary | ICD-10-CM | POA: Insufficient documentation

## 2014-09-06 DIAGNOSIS — Z79899 Other long term (current) drug therapy: Secondary | ICD-10-CM | POA: Insufficient documentation

## 2014-09-06 NOTE — Discharge Instructions (Signed)
Remove 2 inches of the packing each day until the last of the gauze is removed.  After the gauze is out, began soaks twice per day. Gentle massage around the area to express water into and out of the wound.  Continue your medications.

## 2014-09-06 NOTE — ED Notes (Signed)
Pt here for packing removal of abscess to abd.

## 2014-09-06 NOTE — ED Provider Notes (Signed)
CSN: [id number]     Arrival date & time 09/06/14  4098 History   First MD Initiated Contact with Patient 09/06/14 0800     Chief Complaint  Patient presents with  . Wound Check      HPI  Patient presents for evaluation and wound check of an abscess on his abdominal wall. Seen and evaluated here in 48 hours ago. Had incision and drainage of the lower anterior abdominal wall abscess had gauze packing placed. States it is has been draining at home. He presents for evaluation.  He states that overall the pain is much less, and the redness is "almost gone".  Past Medical History  Diagnosis Date  . Hypertension    History reviewed. No pertinent past surgical history. Family History  Problem Relation Age of Onset  . Heart disease Father 67    MI  . Hypertension Father   . Sudden death Father     Heart disease  . Diabetes Mother     Borderline  . Hyperlipidemia Mother   . Hypertension Mother   . Breast cancer Maternal Grandmother    History  Substance Use Topics  . Smoking status: Never Smoker   . Smokeless tobacco: Never Used  . Alcohol Use: Yes    Review of Systems  Constitutional: Negative for fever, chills, diaphoresis, appetite change and fatigue.  HENT: Negative for mouth sores, sore throat and trouble swallowing.   Eyes: Negative for visual disturbance.  Respiratory: Negative for cough, chest tightness, shortness of breath and wheezing.   Cardiovascular: Negative for chest pain.  Gastrointestinal: Negative for nausea, vomiting, abdominal pain, diarrhea and abdominal distention.  Endocrine: Negative for polydipsia, polyphagia and polyuria.  Genitourinary: Negative for dysuria, frequency and hematuria.  Musculoskeletal: Negative for gait problem.  Skin: Positive for wound. Negative for color change, pallor and rash.  Neurological: Negative for dizziness, syncope, light-headedness and headaches.  Hematological: Does not bruise/bleed easily.  Psychiatric/Behavioral:  Negative for behavioral problems and confusion.      Allergies  Review of patient's allergies indicates no known allergies.  Home Medications   Prior to Admission medications   Medication Sig Start Date End Date Taking? Authorizing Provider  benzonatate (TESSALON) 100 MG capsule Take 1 capsule (100 mg total) by mouth 3 (three) times daily as needed. 02/09/14   Ramon Dredge Saguier, PA-C  clindamycin (CLEOCIN) 300 MG capsule Take 1 capsule (300 mg total) by mouth 4 (four) times daily. X 7 days 09/04/14   Arby Barrette, MD  doxycycline (VIBRA-TABS) 100 MG tablet Take 1 tablet (100 mg total) by mouth 2 (two) times daily. 02/09/14   Ramon Dredge Saguier, PA-C  fluticasone (FLONASE) 50 MCG/ACT nasal spray Place 2 sprays into both nostrils daily. 02/09/14   Ramon Dredge Saguier, PA-C  HYDROcodone-acetaminophen (NORCO/VICODIN) 5-325 MG per tablet Take 1-2 tablets by mouth every 4 (four) hours as needed for moderate pain or severe pain. 09/04/14   Arby Barrette, MD  ibuprofen (ADVIL,MOTRIN) 200 MG tablet Take 600-800 mg by mouth daily as needed. For pain    Historical Provider, MD  ibuprofen (ADVIL,MOTRIN) 800 MG tablet Take 1 tablet (800 mg total) by mouth 3 (three) times daily. 09/04/14   Arby Barrette, MD  lisinopril (PRINIVIL,ZESTRIL) 40 MG tablet Take 1 tablet (40 mg total) by mouth daily. 02/19/14   Lelon Perla, DO  lisinopril-hydrochlorothiazide (PRINZIDE,ZESTORETIC) 20-12.5 MG per tablet TAKE 2 TABLETS BY MOUTH DAILY. 01/08/14   Lelon Perla, DO  miconazole (MICOTIN) 2 % cream Apply 1 application topically 2 (  two) times daily. 09/04/14   Arby Barrette, MD  simvastatin (ZOCOR) 40 MG tablet Take 1 tablet (40 mg total) by mouth at bedtime. 02/26/14 02/27/15  Yvonne R Lowne, DO   BP 158/94 mmHg  Pulse 76  Temp(Src) 98.2 F (36.8 C) (Oral)  Resp 20  SpO2 100% Physical Exam  Constitutional: He is oriented to person, place, and time. He appears well-developed and well-nourished. No distress.  HENT:  Head:  Normocephalic.  Eyes: Conjunctivae are normal. Pupils are equal, round, and reactive to light. No scleral icterus.  Neck: Normal range of motion. Neck supple. No thyromegaly present.  Cardiovascular: Normal rate and regular rhythm.  Exam reveals no gallop and no friction rub.   No murmur heard. Pulmonary/Chest: Effort normal and breath sounds normal. No respiratory distress. He has no wheezes. He has no rales.  Abdominal: Soft. Bowel sounds are normal. He exhibits no distension. There is no tenderness. There is no rebound.    Musculoskeletal: Normal range of motion.  Neurological: He is alert and oriented to person, place, and time.  Skin: Skin is warm and dry. No rash noted.  Psychiatric: He has a normal mood and affect. His behavior is normal.    ED Course  Procedures (including critical care time) Labs Review Labs Reviewed - No data to display  Imaging Review No results found.   EKG Interpretation None      MDM   Final diagnoses:  Wound check, abscess    Patient instructed in gauze removal. He feels confluent continuing this at home. Asked him to remove to 3 inches per day until the entirety of the gauze is out. Then begin gentle soaks with gentle massage to continue flushing the wound. Continue his antibiotics. Recheck here with any difficulties, or failure to improve.    Rolland Porter, MD 09/06/14 [phone]

## 2014-10-20 ENCOUNTER — Encounter (HOSPITAL_BASED_OUTPATIENT_CLINIC_OR_DEPARTMENT_OTHER): Payer: Self-pay | Admitting: Emergency Medicine

## 2014-10-20 ENCOUNTER — Emergency Department (HOSPITAL_BASED_OUTPATIENT_CLINIC_OR_DEPARTMENT_OTHER)
Admission: EM | Admit: 2014-10-20 | Discharge: 2014-10-20 | Disposition: A | Discharge status: Home / Self Care | Payer: No Typology Code available for payment source | Attending: Emergency Medicine | Admitting: Emergency Medicine

## 2014-10-20 DIAGNOSIS — Z792 Long term (current) use of antibiotics: Secondary | ICD-10-CM | POA: Insufficient documentation

## 2014-10-20 DIAGNOSIS — R197 Diarrhea, unspecified: Secondary | ICD-10-CM

## 2014-10-20 DIAGNOSIS — Z7951 Long term (current) use of inhaled steroids: Secondary | ICD-10-CM | POA: Insufficient documentation

## 2014-10-20 DIAGNOSIS — Z79899 Other long term (current) drug therapy: Secondary | ICD-10-CM | POA: Insufficient documentation

## 2014-10-20 DIAGNOSIS — I10 Essential (primary) hypertension: Secondary | ICD-10-CM | POA: Insufficient documentation

## 2014-10-20 DIAGNOSIS — R1031 Right lower quadrant pain: Secondary | ICD-10-CM | POA: Insufficient documentation

## 2014-10-20 LAB — BASIC METABOLIC PANEL
ANION GAP: 6 (ref 5–15)
BUN: 13 mg/dL (ref 6–20)
CO2: 27 mmol/L (ref 22–32)
Calcium: 8.9 mg/dL (ref 8.9–10.3)
Chloride: 107 mmol/L (ref 101–111)
Creatinine, Ser: 0.88 mg/dL (ref 0.61–1.24)
GFR calc Af Amer: >60 mL/min (ref >60)
GFR calc non Af Amer: >60 mL/min (ref >60)
GLUCOSE: 112 mg/dL — AB (ref 65–99)
POTASSIUM: 4 mmol/L (ref 3.5–5.1)
Sodium: 140 mmol/L (ref 135–145)

## 2014-10-20 LAB — CBC WITH DIFFERENTIAL/PLATELET
BASOS ABS: 0.1 10*3/uL (ref 0.0–0.1)
Basophils Relative: 0 % (ref 0–1)
Eosinophils Absolute: 0.6 10*3/uL (ref 0.0–0.7)
Eosinophils Relative: 4 % (ref 0–5)
HEMATOCRIT: 45.6 % (ref 39.0–52.0)
Hemoglobin: 15.4 g/dL (ref 13.0–17.0)
LYMPHS PCT: 18 % (ref 12–46)
Lymphs Abs: 2.2 10*3/uL (ref 0.7–4.0)
MCH: 28.1 pg (ref 26.0–34.0)
MCHC: 33.8 g/dL (ref 30.0–36.0)
MCV: 83.2 fL (ref 78.0–100.0)
MONO ABS: 1 10*3/uL (ref 0.1–1.0)
MONOS PCT: 8 % (ref 3–12)
NEUTROS ABS: 8.8 10*3/uL — AB (ref 1.7–7.7)
Neutrophils Relative %: 70 % (ref 43–77)
PLATELETS: 258 10*3/uL (ref 150–400)
RBC: 5.48 MIL/uL (ref 4.22–5.81)
RDW: 13.1 % (ref 11.5–15.5)
WBC: 12.6 10*3/uL — ABNORMAL HIGH (ref 4.0–10.5)

## 2014-10-20 LAB — C DIFFICILE QUICK SCREEN W PCR REFLEX
C Diff antigen: POSITIVE — AB
C Diff interpretation: POSITIVE
C Diff toxin: POSITIVE — AB

## 2014-10-20 MED ORDER — ACETAMINOPHEN 500 MG PO TABS
1000.0000 mg | ORAL_TABLET | Freq: Once | ORAL | Status: AC
  Administered 2014-10-20: 1000 mg via ORAL
  Filled 2014-10-20: qty 2

## 2014-10-20 MED ORDER — METRONIDAZOLE 500 MG PO TABS: 500.0000 mg | ORAL_TABLET | Freq: Three times a day (TID) | ORAL | Status: DC

## 2014-10-20 MED ORDER — FENTANYL CITRATE (PF) 100 MCG/2ML IJ SOLN: 100.0000 ug | INTRAMUSCULAR | Status: DC | PRN

## 2014-10-20 MED ORDER — CIPROFLOXACIN HCL 500 MG PO TABS: 500.0000 mg | ORAL_TABLET | Freq: Two times a day (BID) | ORAL | Status: DC

## 2014-10-20 NOTE — Discharge Instructions (Signed)

## 2014-10-20 NOTE — ED Notes (Signed)
Lab phones this rn with results of testing, pt is + anitgen and toxin for c-diff. Dr. Loretha Stapler notified, no new orders received.

## 2014-10-20 NOTE — ED Provider Notes (Signed)
CSN: [id number]     Arrival date & time 10/20/14  1191 History   First MD Initiated Contact with Patient 10/20/14 [phone]     Chief Complaint  Patient presents with  . Abdominal Pain     (Consider location/radiation/quality/duration/timing/severity/associated sxs/prior Treatment) Patient is a 39 y.o. male presenting with diarrhea.  Diarrhea Diarrhea characteristics: gritty. 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 Hypertension    History reviewed. No pertinent past surgical history. Family History  Problem Relation Age of Onset  . Heart disease Father 62    MI  . Hypertension Father   . Sudden death Father     Heart disease  . Diabetes Mother     Borderline  . Hyperlipidemia Mother   . Hypertension Mother   . Breast cancer Maternal Grandmother    Social History  Substance Use Topics  . Smoking status: Never Smoker   . Smokeless tobacco: Never Used  . Alcohol Use: Yes    Review of Systems  Constitutional: Negative for fever.  Gastrointestinal: Positive for abdominal pain (cramping, lower abdomen) and diarrhea. Negative for vomiting.  All other systems reviewed and are negative.     Allergies  Review of patient's allergies indicates no known allergies.  Home Medications   Prior to Admission medications   Medication Sig Start Date End Date Taking? Authorizing Provider  benzonatate (TESSALON) 100 MG capsule Take 1 capsule (100 mg total) by mouth 3 (three) times daily as needed. 02/09/14   Ramon Dredge Saguier, PA-C  clindamycin (CLEOCIN) 300 MG  capsule Take 1 capsule (300 mg total) by mouth 4 (four) times daily. X 7 days 09/04/14   Arby Barrette, MD  doxycycline (VIBRA-TABS) 100 MG tablet Take 1 tablet (100 mg total) by mouth 2 (two) times daily. 02/09/14   Ramon Dredge Saguier, PA-C  fluticasone (FLONASE) 50 MCG/ACT nasal spray Place 2 sprays into both nostrils daily. 02/09/14   Ramon Dredge Saguier, PA-C  HYDROcodone-acetaminophen (NORCO/VICODIN) 5-325 MG per tablet Take 1-2 tablets by mouth every 4 (four) hours as needed for moderate pain or severe pain. 09/04/14   Arby Barrette, MD  ibuprofen (ADVIL,MOTRIN) 200 MG tablet Take 600-800 mg by mouth daily as needed. For pain    Historical Provider, MD  ibuprofen (ADVIL,MOTRIN) 800 MG tablet Take 1 tablet (800 mg total) by mouth 3 (three) times daily. 09/04/14   Arby Barrette, MD  lisinopril (PRINIVIL,ZESTRIL) 40 MG tablet Take 1 tablet (40 mg total) by mouth daily. 02/19/14   Lelon Perla, DO  lisinopril-hydrochlorothiazide (PRINZIDE,ZESTORETIC) 20-12.5 MG per tablet TAKE 2 TABLETS BY MOUTH DAILY. 01/08/14   Lelon Perla, DO  miconazole (MICOTIN) 2 % cream Apply 1 application topically 2 (two) times daily. 09/04/14   Arby Barrette, MD  simvastatin (ZOCOR) 40 MG tablet Take 1 tablet (40 mg total) by mouth at bedtime. 02/26/14 02/27/15  Grayling Congress Lowne, DO   BP 159/102 mmHg  Pulse 86  Temp(Src) 98.5 F (36.9 C) (Oral)  Resp 20  Ht 6' (1.829 m)  Wt 365 lb (165.563 kg)  BMI 49.49 kg/m2  SpO2 98% Physical Exam  Constitutional:  He is oriented to person, place, and time. He appears well-developed and well-nourished. No distress.  HENT:  Head: Normocephalic and atraumatic.  Eyes: Conjunctivae are normal. No scleral icterus.  Neck: Neck supple.  Cardiovascular: Normal rate and intact distal pulses.   Pulmonary/Chest: Effort normal. No stridor. No respiratory distress.  Abdominal: Normal appearance. He exhibits no distension. There is tenderness (mild) in the right lower quadrant, suprapubic area and left  lower quadrant. There is no rigidity, no rebound and no guarding.  Neurological: He is alert and oriented to person, place, and time.  Skin: Skin is warm and dry. No rash noted.  Psychiatric: He has a normal mood and affect. His behavior is normal.  Nursing note and vitals reviewed.   ED Course  Procedures (including critical care time) Labs Review Labs Reviewed  BASIC METABOLIC PANEL - Abnormal; Notable for the following:    Glucose, Bld 112 (*)    All other components within normal limits  CBC WITH DIFFERENTIAL/PLATELET - Abnormal; Notable for the following:    WBC 12.6 (*)    Neutro Abs 8.8 (*)    All other components within normal limits  C DIFFICILE QUICK SCREEN W PCR REFLEX  STOOL CULTURE    Imaging Review No results found. I have personally reviewed and evaluated these images and lab results as part of my medical decision-making.   EKG Interpretation None      MDM   Final diagnoses:  Diarrhea    Well appearing 39 yo male with diarrhea and lower abdominal cramping for past 3 weeks.  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  Cramping and diarrhea worsened last night.  No fevers, no vomiting.    On exam, he was sitting upright, comfortably in a chair beside his stretcher.  Had mild lower abdominal tenderness without peritoneal signs.    Through shared decision making, we decided to defer CT imaging at this time.  Will obtain stool culture and C Diff.  After several weeks of diarrhea, I think it would be prudent to start abx.  He will follow up with his PCP and return to the ED if symptoms worsen.    Blake Divine, MD 10/20/14 [phone]

## 2014-10-20 NOTE — ED Notes (Signed)
Patient states that he is having intermittent Loose stools with abdominal pain x 2 -3 weeks. The patient reports that last night he cramping pain woke him up

## 2014-10-22 ENCOUNTER — Encounter: Payer: Self-pay | Admitting: Family Medicine

## 2014-10-22 ENCOUNTER — Ambulatory Visit (INDEPENDENT_AMBULATORY_CARE_PROVIDER_SITE_OTHER): Payer: Self-pay | Admitting: Family Medicine

## 2014-10-22 VITALS — BP 138/72 | HR 79 | Temp 98.7°F | Wt 396.2 lb

## 2014-10-22 DIAGNOSIS — A047 Enterocolitis due to Clostridium difficile: Secondary | ICD-10-CM

## 2014-10-22 DIAGNOSIS — A0472 Enterocolitis due to Clostridium difficile, not specified as recurrent: Secondary | ICD-10-CM

## 2014-10-22 DIAGNOSIS — E785 Hyperlipidemia, unspecified: Secondary | ICD-10-CM

## 2014-10-22 LAB — LIPID PANEL
CHOL/HDL RATIO: 4
Cholesterol: 154 mg/dL (ref 0–200)
HDL: 36.1 mg/dL — AB (ref >39.00)
LDL Cholesterol: 99 mg/dL (ref 0–99)
NONHDL: 117.8
Triglycerides: 93 mg/dL (ref 0.0–149.0)
VLDL: 18.6 mg/dL (ref 0.0–40.0)

## 2014-10-22 LAB — CBC WITH DIFFERENTIAL/PLATELET
BASOS ABS: 0.1 10*3/uL (ref 0.0–0.1)
Basophils Relative: 0.8 % (ref 0.0–3.0)
EOS PCT: 10.1 % — AB (ref 0.0–5.0)
Eosinophils Absolute: 1.1 10*3/uL — ABNORMAL HIGH (ref 0.0–0.7)
HCT: 45.5 % (ref 39.0–52.0)
Hemoglobin: 15.4 g/dL (ref 13.0–17.0)
LYMPHS ABS: 2.4 10*3/uL (ref 0.7–4.0)
Lymphocytes Relative: 22 % (ref 12.0–46.0)
MCHC: 33.9 g/dL (ref 30.0–36.0)
MCV: 84.5 fl (ref 78.0–100.0)
MONO ABS: 0.9 10*3/uL (ref 0.1–1.0)
Monocytes Relative: 8.1 % (ref 3.0–12.0)
NEUTROS PCT: 59 % (ref 43.0–77.0)
Neutro Abs: 6.5 10*3/uL (ref 1.4–7.7)
Platelets: 267 10*3/uL (ref 150.0–400.0)
RBC: 5.39 Mil/uL (ref 4.22–5.81)
RDW: 13.6 % (ref 11.5–15.5)
WBC: 11 10*3/uL — ABNORMAL HIGH (ref 4.0–10.5)

## 2014-10-22 LAB — COMPREHENSIVE METABOLIC PANEL
ALT: 66 U/L — ABNORMAL HIGH (ref 0–53)
AST: 31 U/L (ref 0–37)
Albumin: 4 g/dL (ref 3.5–5.2)
Alkaline Phosphatase: 56 U/L (ref 39–117)
BUN: 9 mg/dL (ref 6–23)
CO2: 31 meq/L (ref 19–32)
Calcium: 9.4 mg/dL (ref 8.4–10.5)
Chloride: 103 mEq/L (ref 96–112)
Creatinine, Ser: 0.82 mg/dL (ref 0.40–1.50)
GFR: 110.7 mL/min (ref >60.00)
GLUCOSE: 86 mg/dL (ref 70–99)
POTASSIUM: 3.7 meq/L (ref 3.5–5.1)
Sodium: 140 mEq/L (ref 135–145)
Total Bilirubin: 0.5 mg/dL (ref 0.2–1.2)
Total Protein: 6.9 g/dL (ref 6.0–8.3)

## 2014-10-22 NOTE — Progress Notes (Signed)
Patient ID: Sean [name], male    DOB: 08-14-1975  Age: 39 y.o. MRN: [id number]    Subjective:  Subjective HPI Sean [name] presents for f/u from Er for diarrhea.  He was dx with c diff.  The Er started cipro and flagyl--- today is the first day he has not a lot of diarrhea.    Review of Systems  Constitutional: Negative for diaphoresis, appetite change, fatigue and unexpected weight change.  Eyes: Negative for pain, redness and visual disturbance.  Respiratory: Negative for cough, chest tightness, shortness of breath and wheezing.   Cardiovascular: Negative for chest pain, palpitations and leg swelling.  Endocrine: Negative for cold intolerance, heat intolerance, polydipsia, polyphagia and polyuria.  Genitourinary: Negative for dysuria, frequency and difficulty urinating.  Neurological: Negative for dizziness, light-headedness, numbness and headaches.    History Past Medical History  Diagnosis Date  . Hypertension     He has no past surgical history on file.   His family history includes Breast cancer in his maternal grandmother; Diabetes in his mother; Heart disease (age of onset: 49) in his father; Hyperlipidemia in his mother; Hypertension in his father and mother; Sudden death in his father.He reports that he has never smoked. He has never used smokeless tobacco. He reports that he drinks alcohol. He reports that he does not use illicit drugs.  Current Outpatient Prescriptions on File Prior to Visit  Medication Sig Dispense Refill  . ciprofloxacin (CIPRO) 500 MG tablet Take 1 tablet (500 mg total) by mouth 2 (two) times daily. 20 tablet 0  . ibuprofen (ADVIL,MOTRIN) 800 MG tablet Take 1 tablet (800 mg total) by mouth 3 (three) times daily. 21 tablet 0  . lisinopril (PRINIVIL,ZESTRIL) 40 MG tablet Take 1 tablet (40 mg total) by mouth daily. 90 tablet 3  . metroNIDAZOLE (FLAGYL) 500 MG tablet Take 1 tablet (500 mg total) by mouth 3 (three) times daily. 30 tablet 0  .  simvastatin (ZOCOR) 40 MG tablet Take 1 tablet (40 mg total) by mouth at bedtime. 90 tablet 1   No current facility-administered medications on file prior to visit.     Objective:  Objective Physical Exam  Constitutional: He is oriented to person, place, and time. Vital signs are normal. He appears well-developed and well-nourished. He is sleeping.  HENT:  Head: Normocephalic and atraumatic.  Mouth/Throat: Oropharynx is clear and moist.  Eyes: EOM are normal. Pupils are equal, round, and reactive to light.  Neck: Normal range of motion. Neck supple. No thyromegaly present.  Cardiovascular: Normal rate and regular rhythm.   No murmur heard. Pulmonary/Chest: Effort normal and breath sounds normal. No respiratory distress. He has no wheezes. He has no rales. He exhibits no tenderness.  Musculoskeletal: He exhibits no edema or tenderness.  Neurological: He is alert and oriented to person, place, and time.  Skin: Skin is warm and dry.  Psychiatric: He has a normal mood and affect. His behavior is normal. Judgment and thought content normal.   BP 138/72 mmHg  Pulse 79  Temp(Src) 98.7 F (37.1 C) (Oral)  Wt 396 lb 3.2 oz (179.715 kg)  SpO2 97% Wt Readings from Last 3 Encounters:  10/22/14 396 lb 3.2 oz (179.715 kg)  10/20/14 365 lb (165.563 kg)  09/04/14 398 lb (180.532 kg)     Lab Results  Component Value Date   WBC 11.0* 10/22/2014   HGB 15.4 10/22/2014   HCT 45.5 10/22/2014   PLT 267.0 10/22/2014   GLUCOSE 86 10/22/2014  CHOL 154 10/22/2014   TRIG 93.0 10/22/2014   HDL 36.10* 10/22/2014   LDLDIRECT 186.2 10/27/2013   LDLCALC 99 10/22/2014   ALT 66* 10/22/2014   AST 31 10/22/2014   NA 140 10/22/2014   K 3.7 10/22/2014   CL 103 10/22/2014   CREATININE 0.82 10/22/2014   BUN 9 10/22/2014   CO2 31 10/22/2014   TSH 0.44 10/27/2013    No results found.   Assessment & Plan:  Plan I have discontinued Mr. Federici lisinopril-hydrochlorothiazide, doxycycline,  fluticasone, benzonatate, clindamycin, HYDROcodone-acetaminophen, and miconazole. I am also having him maintain his lisinopril, simvastatin, ibuprofen, ciprofloxacin, and metroNIDAZOLE.  No orders of the defined types were placed in this encounter.    Problem List Items Addressed This Visit      Unprioritized   Hyperlipidemia   Relevant Orders   Comp Met (CMET) (Completed)   Lipid panel (Completed)   CBC with Differential/Platelet (Completed)    Other Visit Diagnoses    Enteritis due to Clostridium difficile    -  Primary    Relevant Orders    Comp Met (CMET) (Completed)    Lipid panel (Completed)    CBC with Differential/Platelet (Completed)     finish cipro and flagyl rto prn Follow-up: Return in about 3 months (around 01/21/2015), or if symptoms worsen or fail to improve, for annual exam, fasting, hypertension, hyperlipidemia.  Sean Koyanagi, DO

## 2014-10-22 NOTE — Progress Notes (Signed)
Pre visit review using our clinic review tool, if applicable. No additional management support is needed unless otherwise documented below in the visit note. 

## 2014-10-22 NOTE — Patient Instructions (Signed)
Clostridium Difficile Infection °Clostridium difficile (C. difficile) is a bacteria found in the intestinal tract or colon. Under certain conditions, it causes diarrhea and sometimes severe disease. The severe form of the disease is known as pseudomembranous colitis (often called C. difficile colitis). This disease can damage the lining of the colon or cause the colon to become enlarged (toxic megacolon). °CAUSES °Your colon normally contains many different bacteria, including C. difficile. The balance of bacteria in your colon can change during illness. This is especially true when you take antibiotic medicine. Taking antibiotics may allow the C. difficile to grow, multiply excessively, and make a toxin that then causes illness. The elderly and people with certain medical conditions have a greater risk of getting C. difficile infections. °SYMPTOMS °· Watery diarrhea. °· Fever. °· Fatigue. °· Loss of appetite. °· Nausea. °· Abdominal swelling, pain, or tenderness. °· Dehydration. °DIAGNOSIS °Your symptoms may make your caregiver suspect a C. difficile infection, especially if you have used antibiotics in the preceding weeks. However, there are only 2 ways to know for certain whether you have a C. difficile infection: °· A lab test that finds the toxin in your stool. °· The specific appearance of an abnormality (pseudomembrane) in your colon. This can only be seen by doing a sigmoidoscopy or colonoscopy. These procedures involve passing an instrument through your rectum to look at the inside of your colon. °Your caregiver will help determine if these tests are necessary. °TREATMENT °· Most people are successfully treated with one of two specific antibiotics, usually given by mouth. Other antibiotics you are receiving are stopped if possible. °· Intravenous (IV) fluids and correction of electrolyte imbalance may be necessary. °· Rarely, surgery may be needed to remove the infected part of the intestines. °· Careful  hand washing by you and your caregivers is important to prevent the spread of infection. In the hospital, your caregivers may also put on gowns and gloves to prevent the spread of the C. difficile bacteria. Your room is also cleaned regularly with a solution containing bleach or a product that is known to kill C. difficile. °HOME CARE INSTRUCTIONS °· Drink enough fluids to keep your urine clear or pale yellow. Avoid milk, caffeine, and alcohol. °· Ask your caregiver for specific rehydration instructions. °· Try eating small, frequent meals rather than large meals. °· Take your antibiotics as directed. Finish them even if you start to feel better. °· Do not use medicines to slow diarrhea. This could delay healing or cause complications. °· Wash your hands thoroughly after using the bathroom and before preparing food. °· Make sure people who live with you wash their hands often, too. °· Carefully disinfect all surfaces with a product that contains chlorine bleach. °SEEK MEDICAL CARE IF: °· Diarrhea persists longer than expected or recurs after completing your course of antibiotic treatment for the C. difficile infection. °· You have trouble staying hydrated. °SEEK IMMEDIATE MEDICAL CARE IF: °· You develop a new fever. °· You have increasing abdominal pain or tenderness. °· There is blood in your stools, or your stools are dark black and tarry. °· You cannot hold down food or liquids. °MAKE SURE YOU: °· Understand these instructions. °· Will watch your condition. °· Will get help right away if you are not doing well or get worse. °Document Released: 11/02/2004 Document Revised: 06/09/2013 Document Reviewed: 07/01/2010 °ExitCare® Patient Information ©2015 [employer]. This information is not intended to replace advice given to you by your health care provider. Make sure you   discuss any questions you have with your health care provider. ° °

## 2014-10-24 LAB — STOOL CULTURE

## 2015-01-21 ENCOUNTER — Encounter: Payer: Self-pay | Admitting: Family Medicine

## 2015-01-21 ENCOUNTER — Ambulatory Visit (INDEPENDENT_AMBULATORY_CARE_PROVIDER_SITE_OTHER): Payer: Self-pay | Admitting: Family Medicine

## 2015-01-21 VITALS — BP 148/95 | HR 95 | Temp 98.0°F | Ht 72.0 in | Wt >= 6400 oz

## 2015-01-21 DIAGNOSIS — E785 Hyperlipidemia, unspecified: Secondary | ICD-10-CM

## 2015-01-21 DIAGNOSIS — L918 Other hypertrophic disorders of the skin: Secondary | ICD-10-CM

## 2015-01-21 DIAGNOSIS — H538 Other visual disturbances: Secondary | ICD-10-CM

## 2015-01-21 DIAGNOSIS — I10 Essential (primary) hypertension: Secondary | ICD-10-CM

## 2015-01-21 MED ORDER — LOSARTAN POTASSIUM 50 MG PO TABS
50.0000 mg | ORAL_TABLET | Freq: Every day | ORAL | Status: DC
Start: 2015-01-21 — End: 2015-04-08

## 2015-01-21 NOTE — Progress Notes (Signed)
Pre visit review using our clinic review tool, if applicable. No additional management support is needed unless otherwise documented below in the visit note. 

## 2015-01-21 NOTE — Patient Instructions (Signed)
Hypertension Hypertension, commonly called high blood pressure, is when the force of blood pumping through your arteries is too strong. Your arteries are the blood vessels that carry blood from your heart throughout your body. A blood pressure reading consists of a higher number over a lower number, such as 110/72. The higher number (systolic) is the pressure inside your arteries when your heart pumps. The lower number (diastolic) is the pressure inside your arteries when your heart relaxes. Ideally you want your blood pressure below 120/80. Hypertension forces your heart to work harder to pump blood. Your arteries may become narrow or stiff. Having untreated or uncontrolled hypertension can cause heart attack, stroke, kidney disease, and other problems. RISK FACTORS Some risk factors for high blood pressure are controllable. Others are not.  Risk factors you cannot control include:   Race. You may be at higher risk if you are African American.  Age. Risk increases with age.  Gender. Men are at higher risk than women before age 45 years. After age 65, women are at higher risk than men. Risk factors you can control include:  Not getting enough exercise or physical activity.  Being overweight.  Getting too much fat, sugar, calories, or salt in your diet.  Drinking too much alcohol. SIGNS AND SYMPTOMS Hypertension does not usually cause signs or symptoms. Extremely high blood pressure (hypertensive crisis) may cause headache, anxiety, shortness of breath, and nosebleed. DIAGNOSIS To check if you have hypertension, your health care provider will measure your blood pressure while you are seated, with your arm held at the level of your heart. It should be measured at least twice using the same arm. Certain conditions can cause a difference in blood pressure between your right and left arms. A blood pressure reading that is higher than normal on one occasion does not mean that you need treatment. If  it is not clear whether you have high blood pressure, you may be asked to return on a different day to have your blood pressure checked again. Or, you may be asked to monitor your blood pressure at home for 1 or more weeks. TREATMENT Treating high blood pressure includes making lifestyle changes and possibly taking medicine. Living a healthy lifestyle can help lower high blood pressure. You may need to change some of your habits. Lifestyle changes may include:  Following the DASH diet. This diet is high in fruits, vegetables, and whole grains. It is low in salt, red meat, and added sugars.  Keep your sodium intake below 2,300 mg per day.  Getting at least 30-45 minutes of aerobic exercise at least 4 times per week.  Losing weight if necessary.  Not smoking.  Limiting alcoholic beverages.  Learning ways to reduce stress. Your health care provider may prescribe medicine if lifestyle changes are not enough to get your blood pressure under control, and if one of the following is true:  You are 18-59 years of age and your systolic blood pressure is above 140.  You are 60 years of age or older, and your systolic blood pressure is above 150.  Your diastolic blood pressure is above 90.  You have diabetes, and your systolic blood pressure is over 140 or your diastolic blood pressure is over 90.  You have kidney disease and your blood pressure is above 140/90.  You have heart disease and your blood pressure is above 140/90. Your personal target blood pressure may vary depending on your medical conditions, your age, and other factors. HOME CARE INSTRUCTIONS    Have your blood pressure rechecked as directed by your health care provider.   Take medicines only as directed by your health care provider. Follow the directions carefully. Blood pressure medicines must be taken as prescribed. The medicine does not work as well when you skip doses. Skipping doses also puts you at risk for  problems.  Do not smoke.   Monitor your blood pressure at home as directed by your health care provider. SEEK MEDICAL CARE IF:   You think you are having a reaction to medicines taken.  You have recurrent headaches or feel dizzy.  You have swelling in your ankles.  You have trouble with your vision. SEEK IMMEDIATE MEDICAL CARE IF:  You develop a severe headache or confusion.  You have unusual weakness, numbness, or feel faint.  You have severe chest or abdominal pain.  You vomit repeatedly.  You have trouble breathing. MAKE SURE YOU:   Understand these instructions.  Will watch your condition.  Will get help right away if you are not doing well or get worse.   This information is not intended to replace advice given to you by your health care provider. Make sure you discuss any questions you have with your health care provider.   Document Released: 01/23/2005 Document Revised: 06/09/2014 Document Reviewed: 11/15/2012 Elsevier Interactive Patient Education 2016 [employer]  

## 2015-01-21 NOTE — Progress Notes (Signed)
+0Patient ID: Sean [name], male    DOB: November 18, 1975  Age: 39 y.o. MRN: [id number]    Subjective:  Subjective HPI Sean [name] presents for f/u bp and cholesterol.  Pt stopped taking zocor 1 month ago due to cost. and lisinopril is causing a cough.    Review of Systems  Constitutional: Negative for diaphoresis, appetite change, fatigue and unexpected weight change.  Eyes: Negative for pain, redness and visual disturbance.  Respiratory: Negative for cough, chest tightness, shortness of breath and wheezing.   Cardiovascular: Negative for chest pain, palpitations and leg swelling.  Endocrine: Negative for cold intolerance, heat intolerance, polydipsia, polyphagia and polyuria.  Genitourinary: Negative for dysuria, frequency and difficulty urinating.  Neurological: Negative for dizziness, light-headedness, numbness and headaches.    History Past Medical History  Diagnosis Date  . Hypertension     He has no past surgical history on file.   His family history includes Breast cancer in his maternal grandmother; Diabetes in his mother; Heart disease (age of onset: 31) in his father; Hyperlipidemia in his mother; Hypertension in his father and mother; Sudden death in his father.He reports that he has never smoked. He has never used smokeless tobacco. He reports that he drinks alcohol. He reports that he does not use illicit drugs.  Current Outpatient Prescriptions on File Prior to Visit  Medication Sig Dispense Refill  . ibuprofen (ADVIL,MOTRIN) 800 MG tablet Take 1 tablet (800 mg total) by mouth 3 (three) times daily. (Patient not taking: Reported on 01/21/2015) 21 tablet 0  . simvastatin (ZOCOR) 40 MG tablet Take 1 tablet (40 mg total) by mouth at bedtime. (Patient not taking: Reported on 01/21/2015) 90 tablet 1   No current facility-administered medications on file prior to visit.     Objective:  Objective Physical Exam  Constitutional: He is oriented to person, place, and time.  Vital signs are normal. He appears well-developed and well-nourished. He is sleeping.  HENT:  Head: Normocephalic and atraumatic.  Mouth/Throat: Oropharynx is clear and moist.  Eyes: EOM are normal. Pupils are equal, round, and reactive to light.    Neck: Normal range of motion. Neck supple. No thyromegaly present.  Cardiovascular: Normal rate and regular rhythm.   No murmur heard. Pulmonary/Chest: Effort normal and breath sounds normal. No respiratory distress. He has no wheezes. He has no rales. He exhibits no tenderness.  Musculoskeletal: He exhibits no edema or tenderness.  Neurological: He is alert and oriented to person, place, and time.  Skin: Skin is warm and dry.  Psychiatric: He has a normal mood and affect. His behavior is normal. Judgment and thought content normal.  Nursing note and vitals reviewed.  BP 148/95 mmHg  Pulse 95  Temp(Src) 98 F (36.7 C) (Oral)  Ht 6' (1.829 m)  Wt 402 lb 9.6 oz (182.618 kg)  BMI 54.59 kg/m2  SpO2 97% Wt Readings from Last 3 Encounters:  01/21/15 402 lb 9.6 oz (182.618 kg)  10/22/14 396 lb 3.2 oz (179.715 kg)  10/20/14 365 lb (165.563 kg)     Lab Results  Component Value Date   WBC 11.0* 10/22/2014   HGB 15.4 10/22/2014   HCT 45.5 10/22/2014   PLT 267.0 10/22/2014   GLUCOSE 86 10/22/2014   CHOL 154 10/22/2014   TRIG 93.0 10/22/2014   HDL 36.10* 10/22/2014   LDLDIRECT 186.2 10/27/2013   LDLCALC 99 10/22/2014   ALT 66* 10/22/2014   AST 31 10/22/2014   NA 140 10/22/2014   K 3.7 10/22/2014  CL 103 10/22/2014   CREATININE 0.82 10/22/2014   BUN 9 10/22/2014   CO2 31 10/22/2014   TSH 0.44 10/27/2013    No results found.   Assessment & Plan:  Plan I have discontinued Mr. Sevigny lisinopril, ciprofloxacin, and metroNIDAZOLE. I am also having him start on losartan. Additionally, I am having him maintain his simvastatin and ibuprofen.  Meds ordered this encounter  Medications  . losartan (COZAAR) 50 MG tablet    Sig:  Take 1 tablet (50 mg total) by mouth daily.    Dispense:  30 tablet    Refill:  1    Problem List Items Addressed This Visit    Hyperlipidemia   Relevant Medications   losartan (COZAAR) 50 MG tablet   Other Relevant Orders   CBC with Differential/Platelet   Lipid panel   Comp Met (CMET)    Other Visit Diagnoses    Essential hypertension    -  Primary    Relevant Medications    losartan (COZAAR) 50 MG tablet    Other Relevant Orders    CBC with Differential/Platelet    Lipid panel    Comp Met (CMET)    Blurry vision        Relevant Orders    Ambulatory referral to Ophthalmology    Skin tag        Relevant Orders    Ambulatory referral to Ophthalmology       Follow-up: Return in about 3 months (around 04/21/2015), or if symptoms worsen or fail to improve, for hypertension, hyperlipidemia.  Sean Koyanagi, DO

## 2015-01-22 ENCOUNTER — Encounter: Payer: Self-pay | Admitting: Family Medicine

## 2015-01-22 LAB — COMPREHENSIVE METABOLIC PANEL
ALBUMIN: 4 g/dL (ref 3.5–5.2)
ALK PHOS: 63 U/L (ref 39–117)
ALT: 41 U/L (ref 0–53)
AST: 23 U/L (ref 0–37)
BILIRUBIN TOTAL: 0.5 mg/dL (ref 0.2–1.2)
BUN: 14 mg/dL (ref 6–23)
CALCIUM: 8.9 mg/dL (ref 8.4–10.5)
CO2: 29 mEq/L (ref 19–32)
CREATININE: 0.95 mg/dL (ref 0.40–1.50)
Chloride: 104 mEq/L (ref 96–112)
GFR: 93.29 mL/min (ref >60.00)
Glucose, Bld: 78 mg/dL (ref 70–99)
Potassium: 3.6 mEq/L (ref 3.5–5.1)
SODIUM: 139 meq/L (ref 135–145)
TOTAL PROTEIN: 6.9 g/dL (ref 6.0–8.3)

## 2015-01-22 LAB — LIPID PANEL
CHOLESTEROL: 163 mg/dL (ref 0–200)
HDL: 24.4 mg/dL — ABNORMAL LOW (ref >39.00)
LDL Cholesterol: 108 mg/dL — ABNORMAL HIGH (ref 0–99)
NonHDL: 138.56
TRIGLYCERIDES: 153 mg/dL — AB (ref 0.0–149.0)
Total CHOL/HDL Ratio: 7
VLDL: 30.6 mg/dL (ref 0.0–40.0)

## 2015-01-22 LAB — CBC WITH DIFFERENTIAL/PLATELET
BASOS PCT: 0.9 % (ref 0.0–3.0)
Basophils Absolute: 0.1 10*3/uL (ref 0.0–0.1)
EOS ABS: 0.4 10*3/uL (ref 0.0–0.7)
EOS PCT: 4 % (ref 0.0–5.0)
HCT: 43.5 % (ref 39.0–52.0)
Hemoglobin: 14.7 g/dL (ref 13.0–17.0)
LYMPHS ABS: 2.9 10*3/uL (ref 0.7–4.0)
Lymphocytes Relative: 27.6 % (ref 12.0–46.0)
MCHC: 33.8 g/dL (ref 30.0–36.0)
MCV: 84.5 fl (ref 78.0–100.0)
MONO ABS: 0.8 10*3/uL (ref 0.1–1.0)
Monocytes Relative: 7.6 % (ref 3.0–12.0)
NEUTROS PCT: 59.9 % (ref 43.0–77.0)
Neutro Abs: 6.3 10*3/uL (ref 1.4–7.7)
Platelets: 258 10*3/uL (ref 150.0–400.0)
RBC: 5.14 Mil/uL (ref 4.22–5.81)
RDW: 13.4 % (ref 11.5–15.5)
WBC: 10.5 10*3/uL (ref 4.0–10.5)

## 2015-04-08 ENCOUNTER — Other Ambulatory Visit: Payer: Self-pay | Admitting: Family Medicine

## 2015-04-22 ENCOUNTER — Ambulatory Visit (INDEPENDENT_AMBULATORY_CARE_PROVIDER_SITE_OTHER): Payer: PRIVATE HEALTH INSURANCE | Admitting: Family Medicine

## 2015-04-22 ENCOUNTER — Telehealth: Payer: Self-pay | Admitting: Family Medicine

## 2015-04-22 ENCOUNTER — Ambulatory Visit: Payer: Self-pay | Admitting: Family Medicine

## 2015-04-22 ENCOUNTER — Encounter: Payer: Self-pay | Admitting: Family Medicine

## 2015-04-22 VITALS — BP 154/110 | HR 88 | Temp 98.2°F | Wt >= 6400 oz

## 2015-04-22 DIAGNOSIS — R5383 Other fatigue: Secondary | ICD-10-CM | POA: Diagnosis not present

## 2015-04-22 DIAGNOSIS — I10 Essential (primary) hypertension: Secondary | ICD-10-CM | POA: Diagnosis not present

## 2015-04-22 DIAGNOSIS — E785 Hyperlipidemia, unspecified: Secondary | ICD-10-CM

## 2015-04-22 LAB — POCT URINALYSIS DIPSTICK
Bilirubin, UA: NEGATIVE
Blood, UA: NEGATIVE
Glucose, UA: NEGATIVE
KETONES UA: NEGATIVE
LEUKOCYTES UA: NEGATIVE
Nitrite, UA: NEGATIVE
PH UA: 6
PROTEIN UA: NEGATIVE
UROBILINOGEN UA: 0.2

## 2015-04-22 MED ORDER — LOSARTAN POTASSIUM-HCTZ 100-25 MG PO TABS: 1.0000 | ORAL_TABLET | Freq: Every day | ORAL | Status: DC

## 2015-04-22 NOTE — Patient Instructions (Signed)
Hypertension Hypertension, commonly called high blood pressure, is when the force of blood pumping through your arteries is too strong. Your arteries are the blood vessels that carry blood from your heart throughout your body. A blood pressure reading consists of a higher number over a lower number, such as 110/72. The higher number (systolic) is the pressure inside your arteries when your heart pumps. The lower number (diastolic) is the pressure inside your arteries when your heart relaxes. Ideally you want your blood pressure below 120/80. Hypertension forces your heart to work harder to pump blood. Your arteries may become narrow or stiff. Having untreated or uncontrolled hypertension can cause heart attack, stroke, kidney disease, and other problems. RISK FACTORS Some risk factors for high blood pressure are controllable. Others are not.  Risk factors you cannot control include:   Race. You may be at higher risk if you are African American.  Age. Risk increases with age.  Gender. Men are at higher risk than women before age 45 years. After age 65, women are at higher risk than men. Risk factors you can control include:  Not getting enough exercise or physical activity.  Being overweight.  Getting too much fat, sugar, calories, or salt in your diet.  Drinking too much alcohol. SIGNS AND SYMPTOMS Hypertension does not usually cause signs or symptoms. Extremely high blood pressure (hypertensive crisis) may cause headache, anxiety, shortness of breath, and nosebleed. DIAGNOSIS To check if you have hypertension, your health care provider will measure your blood pressure while you are seated, with your arm held at the level of your heart. It should be measured at least twice using the same arm. Certain conditions can cause a difference in blood pressure between your right and left arms. A blood pressure reading that is higher than normal on one occasion does not mean that you need treatment. If  it is not clear whether you have high blood pressure, you may be asked to return on a different day to have your blood pressure checked again. Or, you may be asked to monitor your blood pressure at home for 1 or more weeks. TREATMENT Treating high blood pressure includes making lifestyle changes and possibly taking medicine. Living a healthy lifestyle can help lower high blood pressure. You may need to change some of your habits. Lifestyle changes may include:  Following the DASH diet. This diet is high in fruits, vegetables, and whole grains. It is low in salt, red meat, and added sugars.  Keep your sodium intake below 2,300 mg per day.  Getting at least 30-45 minutes of aerobic exercise at least 4 times per week.  Losing weight if necessary.  Not smoking.  Limiting alcoholic beverages.  Learning ways to reduce stress. Your health care provider may prescribe medicine if lifestyle changes are not enough to get your blood pressure under control, and if one of the following is true:  You are 18-59 years of age and your systolic blood pressure is above 140.  You are 60 years of age or older, and your systolic blood pressure is above 150.  Your diastolic blood pressure is above 90.  You have diabetes, and your systolic blood pressure is over 140 or your diastolic blood pressure is over 90.  You have kidney disease and your blood pressure is above 140/90.  You have heart disease and your blood pressure is above 140/90. Your personal target blood pressure may vary depending on your medical conditions, your age, and other factors. HOME CARE INSTRUCTIONS    Have your blood pressure rechecked as directed by your health care provider.   Take medicines only as directed by your health care provider. Follow the directions carefully. Blood pressure medicines must be taken as prescribed. The medicine does not work as well when you skip doses. Skipping doses also puts you at risk for  problems.  Do not smoke.   Monitor your blood pressure at home as directed by your health care provider. SEEK MEDICAL CARE IF:   You think you are having a reaction to medicines taken.  You have recurrent headaches or feel dizzy.  You have swelling in your ankles.  You have trouble with your vision. SEEK IMMEDIATE MEDICAL CARE IF:  You develop a severe headache or confusion.  You have unusual weakness, numbness, or feel faint.  You have severe chest or abdominal pain.  You vomit repeatedly.  You have trouble breathing. MAKE SURE YOU:   Understand these instructions.  Will watch your condition.  Will get help right away if you are not doing well or get worse.   This information is not intended to replace advice given to you by your health care provider. Make sure you discuss any questions you have with your health care provider.   Document Released: 01/23/2005 Document Revised: 06/09/2014 Document Reviewed: 11/15/2012 Elsevier Interactive Patient Education 2016 [employer]  

## 2015-04-22 NOTE — Progress Notes (Signed)
Patient ID: Sean [name], male    DOB: 1976/01/18  Age: 40 y.o. MRN: [id number]    Subjective:  Subjective HPI Sean [name] presents for f/u bp.  He has been tired , no chest pain or sob.    Review of Systems  Constitutional: Negative for diaphoresis, appetite change, fatigue and unexpected weight change.  Eyes: Negative for pain, redness and visual disturbance.  Respiratory: Negative for cough, chest tightness, shortness of breath and wheezing.   Cardiovascular: Negative for chest pain, palpitations and leg swelling.  Endocrine: Negative for cold intolerance, heat intolerance, polydipsia, polyphagia and polyuria.  Genitourinary: Negative for dysuria, frequency and difficulty urinating.  Neurological: Negative for dizziness, light-headedness, numbness and headaches.    History Past Medical History  Diagnosis Date  . Hypertension     He has no past surgical history on file.   His family history includes Breast cancer in his maternal grandmother; Diabetes in his mother; Heart disease (age of onset: 40) in his father; Hyperlipidemia in his mother; Hypertension in his father and mother; Sudden death in his father.He reports that he has never smoked. He has never used smokeless tobacco. He reports that he drinks alcohol. He reports that he does not use illicit drugs.  Current Outpatient Prescriptions on File Prior to Visit  Medication Sig Dispense Refill  . ibuprofen (ADVIL,MOTRIN) 800 MG tablet Take 1 tablet (800 mg total) by mouth 3 (three) times daily. 21 tablet 0   No current facility-administered medications on file prior to visit.     Objective:  Objective Physical Exam  Constitutional: He is oriented to person, place, and time. Vital signs are normal. He appears well-developed and well-nourished. He is sleeping.  HENT:  Head: Normocephalic and atraumatic.  Mouth/Throat: Oropharynx is clear and moist.  Eyes: EOM are normal. Pupils are equal, round, and reactive to light.    Neck: Normal range of motion. Neck supple. No thyromegaly present.  Cardiovascular: Normal rate and regular rhythm.   No murmur heard. Pulmonary/Chest: Effort normal and breath sounds normal. No respiratory distress. He has no wheezes. He has no rales. He exhibits no tenderness.  Musculoskeletal: He exhibits no edema or tenderness.  Neurological: He is alert and oriented to person, place, and time.  Skin: Skin is warm and dry.  Psychiatric: He has a normal mood and affect. His behavior is normal. Judgment and thought content normal.  Nursing note and vitals reviewed.  BP 154/110 mmHg  Pulse 88  Temp(Src) 98.2 F (36.8 C) (Oral)  Wt 418 lb 3.2 oz (189.694 kg)  SpO2 96% Wt Readings from Last 3 Encounters:  04/22/15 418 lb 3.2 oz (189.694 kg)  01/21/15 402 lb 9.6 oz (182.618 kg)  10/22/14 396 lb 3.2 oz (179.715 kg)     Lab Results  Component Value Date   WBC 10.5 01/21/2015   HGB 14.7 01/21/2015   HCT 43.5 01/21/2015   PLT 258.0 01/21/2015   GLUCOSE 78 01/21/2015   CHOL 163 01/21/2015   TRIG 153.0* 01/21/2015   HDL 24.40* 01/21/2015   LDLDIRECT 186.2 10/27/2013   LDLCALC 108* 01/21/2015   ALT 41 01/21/2015   AST 23 01/21/2015   NA 139 01/21/2015   K 3.6 01/21/2015   CL 104 01/21/2015   CREATININE 0.95 01/21/2015   BUN 14 01/21/2015   CO2 29 01/21/2015   TSH 0.44 10/27/2013    No results found.   Assessment & Plan:  Plan I have discontinued Mr. Kwan losartan. I am also having  him start on losartan-hydrochlorothiazide. Additionally, I am having him maintain his ibuprofen.  Meds ordered this encounter  Medications  . losartan-hydrochlorothiazide (HYZAAR) 100-25 MG tablet    Sig: Take 1 tablet by mouth daily.    Dispense:  90 tablet    Refill:  3    Problem List Items Addressed This Visit    HTN (hypertension) - Primary    Inc losartan to 100/25 daily Recheck 2-3 weeks  Check labs      Relevant Medications   losartan-hydrochlorothiazide (HYZAAR)  100-25 MG tablet   Other Relevant Orders   Comp Met (CMET)   CBC with Differential/Platelet   Lipid panel   POCT urinalysis dipstick (Completed)   Vitamin B12   Vitamin D 1,25 dihydroxy   Testosterone Total,Free,Bio, Males   Hyperlipidemia   Relevant Medications   losartan-hydrochlorothiazide (HYZAAR) 100-25 MG tablet    Other Visit Diagnoses    Other fatigue        Relevant Orders    Comp Met (CMET)    CBC with Differential/Platelet    Lipid panel    POCT urinalysis dipstick (Completed)    Vitamin B12    Vitamin D 1,25 dihydroxy    Testosterone Total,Free,Bio, Males       Follow-up: Return in about 2 weeks (around 05/06/2015), or if symptoms worsen or fail to improve, for hypertension.  Garnet Koyanagi, DO

## 2015-04-22 NOTE — Assessment & Plan Note (Signed)
Inc losartan to 100/25 daily Recheck 2-3 weeks  Check labs

## 2015-04-22 NOTE — Telephone Encounter (Signed)
Patient caught a flat tire and canceled his 9:15am appointment for today  [facility] Charge or no charge

## 2015-04-22 NOTE — Progress Notes (Signed)
Pre visit review using our clinic review tool, if applicable. No additional management support is needed unless otherwise documented below in the visit note. 

## 2015-04-22 NOTE — Telephone Encounter (Signed)
No charge Question? --- how do you catch a flat tire?   :)

## 2015-04-23 LAB — CBC WITH DIFFERENTIAL/PLATELET
BASOS PCT: 0.6 % (ref 0.0–3.0)
Basophils Absolute: 0.1 10*3/uL (ref 0.0–0.1)
EOS ABS: 0.5 10*3/uL (ref 0.0–0.7)
Eosinophils Relative: 4.6 % (ref 0.0–5.0)
HCT: 46.2 % (ref 39.0–52.0)
HEMOGLOBIN: 15.8 g/dL (ref 13.0–17.0)
Lymphocytes Relative: 24.8 % (ref 12.0–46.0)
Lymphs Abs: 2.8 10*3/uL (ref 0.7–4.0)
MCHC: 34.1 g/dL (ref 30.0–36.0)
MCV: 84.1 fl (ref 78.0–100.0)
MONO ABS: 1 10*3/uL (ref 0.1–1.0)
Monocytes Relative: 9.3 % (ref 3.0–12.0)
Neutro Abs: 6.8 10*3/uL (ref 1.4–7.7)
Neutrophils Relative %: 60.7 % (ref 43.0–77.0)
Platelets: 292 10*3/uL (ref 150.0–400.0)
RBC: 5.49 Mil/uL (ref 4.22–5.81)
RDW: 13.6 % (ref 11.5–15.5)
WBC: 11.3 10*3/uL — AB (ref 4.0–10.5)

## 2015-04-23 LAB — TESTOSTERONE TOTAL,FREE,BIO, MALES
ALBUMIN: 4.4 g/dL (ref 3.6–5.1)
SEX HORMONE BINDING: 31 nmol/L (ref 10–50)
Testosterone, Bioavailable: 60.1 ng/dL — ABNORMAL LOW (ref 130.5–681.7)
Testosterone, Free: 29.9 pg/mL — ABNORMAL LOW (ref 47.0–244.0)
Testosterone: 228 ng/dL — ABNORMAL LOW (ref 250–827)

## 2015-04-23 LAB — COMPREHENSIVE METABOLIC PANEL
ALBUMIN: 4.3 g/dL (ref 3.5–5.2)
ALT: 35 U/L (ref 0–53)
AST: 22 U/L (ref 0–37)
Alkaline Phosphatase: 65 U/L (ref 39–117)
BILIRUBIN TOTAL: 0.4 mg/dL (ref 0.2–1.2)
BUN: 15 mg/dL (ref 6–23)
CHLORIDE: 103 meq/L (ref 96–112)
CO2: 31 mEq/L (ref 19–32)
CREATININE: 0.88 mg/dL (ref 0.40–1.50)
Calcium: 9.7 mg/dL (ref 8.4–10.5)
GFR: 101.78 mL/min (ref >60.00)
Glucose, Bld: 79 mg/dL (ref 70–99)
Potassium: 3.9 mEq/L (ref 3.5–5.1)
SODIUM: 141 meq/L (ref 135–145)
TOTAL PROTEIN: 7.5 g/dL (ref 6.0–8.3)

## 2015-04-23 LAB — LIPID PANEL
CHOL/HDL RATIO: 6
CHOLESTEROL: 257 mg/dL — AB (ref 0–200)
HDL: 40.5 mg/dL (ref >39.00)
NonHDL: 216.84
TRIGLYCERIDES: 256 mg/dL — AB (ref 0.0–149.0)
VLDL: 51.2 mg/dL — ABNORMAL HIGH (ref 0.0–40.0)

## 2015-04-23 LAB — LDL CHOLESTEROL, DIRECT: Direct LDL: 189 mg/dL

## 2015-04-23 LAB — VITAMIN B12: Vitamin B-12: 304 pg/mL (ref 211–911)

## 2015-04-25 LAB — VITAMIN D 1,25 DIHYDROXY
VITAMIN D3 1, 25 (OH): 47 pg/mL
Vitamin D 1, 25 (OH)2 Total: 47 pg/mL (ref 18–72)

## 2015-05-03 ENCOUNTER — Other Ambulatory Visit: Payer: Self-pay | Admitting: Family Medicine

## 2015-05-03 DIAGNOSIS — R7989 Other specified abnormal findings of blood chemistry: Secondary | ICD-10-CM

## 2015-05-03 MED ORDER — ATORVASTATIN CALCIUM 20 MG PO TABS: 20.0000 mg | ORAL_TABLET | Freq: Every day | ORAL | Status: DC

## 2015-05-03 NOTE — Telephone Encounter (Signed)
New medication sent in and referral done.

## 2015-05-06 ENCOUNTER — Ambulatory Visit (INDEPENDENT_AMBULATORY_CARE_PROVIDER_SITE_OTHER): Payer: PRIVATE HEALTH INSURANCE | Admitting: Family Medicine

## 2015-05-06 ENCOUNTER — Encounter: Payer: Self-pay | Admitting: Family Medicine

## 2015-05-06 DIAGNOSIS — I10 Essential (primary) hypertension: Secondary | ICD-10-CM

## 2015-05-06 MED ORDER — AMLODIPINE BESYLATE 5 MG PO TABS
5.0000 mg | ORAL_TABLET | Freq: Every day | ORAL | Status: DC
Start: 2015-05-06 — End: 2015-12-02

## 2015-05-06 MED ORDER — NALTREXONE-BUPROPION HCL ER 8-90 MG PO TB12: ORAL_TABLET | ORAL | Status: DC

## 2015-05-06 NOTE — Progress Notes (Signed)
Patient ID: Sean [name], male    DOB: [birth year]  Age: 40 y.o. MRN: [id number]    Subjective:  Subjective HPI Sean [name] presents for bp f/u and to discuss labs.    Pt is also asking for contrave to lose weight.  He is really struggling.  Pt is exercising and watching diet.   No cp, no sob.     Review of Systems  Constitutional: Negative for diaphoresis, appetite change, fatigue and unexpected weight change.  Eyes: Negative for pain, redness and visual disturbance.  Respiratory: Negative for cough, chest tightness, shortness of breath and wheezing.   Cardiovascular: Negative for chest pain, palpitations and leg swelling.  Endocrine: Negative for cold intolerance, heat intolerance, polydipsia, polyphagia and polyuria.  Genitourinary: Negative for dysuria, frequency and difficulty urinating.  Neurological: Negative for dizziness, light-headedness, numbness and headaches.    History Past Medical History  Diagnosis Date  . Hypertension     He has no past surgical history on file.   His family history includes Breast cancer in his maternal grandmother; Diabetes in his mother; Heart disease (age of onset: 3256) in his father; Hyperlipidemia in his mother; Hypertension in his father and mother; Sudden death in his father.He reports that he has never smoked. He has never used smokeless tobacco. He reports that he drinks alcohol. He reports that he does not use illicit drugs.  Current Outpatient Prescriptions on File Prior to Visit  Medication Sig Dispense Refill  . atorvastatin (LIPITOR) 20 MG tablet Take 1 tablet (20 mg total) by mouth daily. 30 tablet 3  . ibuprofen (ADVIL,MOTRIN) 800 MG tablet Take 1 tablet (800 mg total) by mouth 3 (three) times daily. 21 tablet 0  . losartan-hydrochlorothiazide (HYZAAR) 100-25 MG tablet Take 1 tablet by mouth daily. 90 tablet 3   No current facility-administered medications on file prior to visit.     Objective:  Objective Physical Exam    Constitutional: He is oriented to person, place, and time. Vital signs are normal. He appears well-developed and well-nourished. He is sleeping.  HENT:  Head: Normocephalic and atraumatic.  Mouth/Throat: Oropharynx is clear and moist.  Eyes: EOM are normal. Pupils are equal, round, and reactive to light.  Neck: Normal range of motion. Neck supple. No thyromegaly present.  Cardiovascular: Normal rate and regular rhythm.   No murmur heard. Pulmonary/Chest: Effort normal and breath sounds normal. No respiratory distress. He has no wheezes. He has no rales. He exhibits no tenderness.  Musculoskeletal: He exhibits no edema or tenderness.  Neurological: He is alert and oriented to person, place, and time.  Skin: Skin is warm and dry.  Psychiatric: He has a normal mood and affect. His behavior is normal. Judgment and thought content normal.  Nursing note and vitals reviewed.  BP 134/88 mmHg  Pulse 84  Temp(Src) 98.6 F (37 C) (Oral)  Ht 5\' 10"  (1.778 m)  Wt 415 lb 3.2 oz (188.333 kg)  BMI 59.57 kg/m2  SpO2 98% Wt Readings from Last 3 Encounters:  05/06/15 415 lb 3.2 oz (188.333 kg)  04/22/15 418 lb 3.2 oz (189.694 kg)  01/21/15 402 lb 9.6 oz (182.618 kg)     Lab Results  Component Value Date   WBC 11.3* 04/22/2015   HGB 15.8 04/22/2015   HCT 46.2 04/22/2015   PLT 292.0 04/22/2015   GLUCOSE 79 04/22/2015   CHOL 257* 04/22/2015   TRIG 256.0* 04/22/2015   HDL 40.50 04/22/2015   LDLDIRECT 189.0 04/22/2015   LDLCALC 108*  01/21/2015   ALT 35 04/22/2015   AST 22 04/22/2015   NA 141 04/22/2015   K 3.9 04/22/2015   CL 103 04/22/2015   CREATININE 0.88 04/22/2015   BUN 15 04/22/2015   CO2 31 04/22/2015   TSH 0.44 10/27/2013    No results found.     Assessment & Plan:  Plan I am having Sean [name] start on amLODipine. I am also having him maintain his ibuprofen, losartan-hydrochlorothiazide, atorvastatin, and Naltrexone-Bupropion HCl ER.  Meds ordered this encounter   Medications  . DISCONTD: Naltrexone-Bupropion HCl ER (CONTRAVE) 8-90 MG TB12    Sig: 2 po bid    Dispense:  120 tablet    Refill:  3  . amLODipine (NORVASC) 5 MG tablet    Sig: Take 1 tablet (5 mg total) by mouth daily.    Dispense:  90 tablet    Refill:  3  . Naltrexone-Bupropion HCl ER (CONTRAVE) 8-90 MG TB12    Sig: 2 po bid    Dispense:  120 tablet    Refill:  3    Problem List Items Addressed This Visit      Unprioritized   HTN (hypertension)    con't losartan  Add norvasc rto 3 months or sooner prn      Relevant Medications   amLODipine (NORVASC) 5 MG tablet   Severe obesity (BMI >= 40) (HCC) - Primary    Start contrave D/w pt diet and exercise rto 3 months      Relevant Medications   Naltrexone-Bupropion HCl ER (CONTRAVE) 8-90 MG TB12      Follow-up: Return in about 3 months (around 08/06/2015), or if symptoms worsen or fail to improve, for hypertension, obesity.  Donato Schultz, DO

## 2015-05-06 NOTE — Progress Notes (Signed)
Pre visit review using our clinic review tool, if applicable. No additional management support is needed unless otherwise documented below in the visit note. 

## 2015-05-06 NOTE — Patient Instructions (Addendum)
contrave---- 1 a day for 1 week then 1 2x a week x 1 week then 1 in am and 2 at night for 1 week then 2 , 2x a day.        Obesity Obesity is defined as having too much total body fat and a body mass index (BMI) of 30 or more. BMI is an estimate of body fat and is calculated from your height and weight. BMI is typically calculated by your health care provider during regular wellness visits. Obesity happens when you consume more calories than you can burn by exercising or performing daily physical tasks. Prolonged obesity can cause major illnesses or emergencies, such as:  Stroke.  Heart disease.  Diabetes.  Cancer.  Arthritis.  High blood pressure (hypertension).  High cholesterol.  Sleep apnea.  Erectile dysfunction.  Infertility problems. CAUSES   Regularly eating unhealthy foods.  Physical inactivity.  Certain disorders, such as an underactive thyroid (hypothyroidism), Cushing's syndrome, and polycystic ovarian syndrome.  Certain medicines, such as steroids, some depression medicines, and antipsychotics.  Genetics.  Lack of sleep. DIAGNOSIS A health care provider can diagnose obesity after calculating your BMI. Obesity will be diagnosed if your BMI is 30 or higher. There are other methods of measuring obesity levels. Some other methods include measuring your skinfold thickness, your waist circumference, and comparing your hip circumference to your waist circumference. TREATMENT  A healthy treatment program includes some or all of the following:  Long-term dietary changes.  Exercise and physical activity.  Behavioral and lifestyle changes.  Medicine only under the supervision of your health care provider. Medicines may help, but only if they are used with diet and exercise programs. If your BMI is 40 or higher, your health care provider may recommend specialized surgery or programs to help with weight loss. An unhealthy treatment program  includes:  Fasting.  Fad diets.  Supplements and drugs. These choices do not succeed in long-term weight control. HOME CARE INSTRUCTIONS  Exercise and perform physical activity as directed by your health care provider. To increase physical activity, try the following:  Use stairs instead of elevators.  Park farther away from store entrances.  Garden, bike, or walk instead of watching television or using the computer.  Eat healthy, low-calorie foods and drinks on a regular basis. Eat more fruits and vegetables. Use low-calorie cookbooks or take healthy cooking classes.  Limit fast food, sweets, and processed snack foods.  Eat smaller portions.  Keep a daily journal of everything you eat. There are many free websites to help you with this. It may be helpful to measure your foods so you can determine if you are eating the correct portion sizes.  Avoid drinking alcohol. Drink more water and drinks without calories.  Take vitamins and supplements only as recommended by your health care provider.  Weight-loss support groups, Government social research officerregistered dietitians, counselors, and stress reduction education can also be very helpful. SEEK IMMEDIATE MEDICAL CARE IF:  You have chest pain or tightness.  You have trouble breathing or feel short of breath.  You have weakness or leg numbness.  You feel confused or have trouble talking.  You have sudden changes in your vision.   This information is not intended to replace advice given to you by your health care provider. Make sure you discuss any questions you have with your health care provider.   Document Released: 03/02/2004 Document Revised: 02/13/2014 Document Reviewed: 03/01/2011 Elsevier Interactive Patient Education [employer]2016 Elsevier Inc.

## 2015-05-07 ENCOUNTER — Telehealth: Payer: Self-pay | Admitting: Family Medicine

## 2015-05-07 NOTE — Telephone Encounter (Signed)
error 

## 2015-05-09 NOTE — Assessment & Plan Note (Signed)
con't losartan  Add norvasc rto 3 months or sooner prn

## 2015-05-09 NOTE — Assessment & Plan Note (Signed)
Start contrave D/w pt diet and exercise rto 3 months

## 2015-05-12 ENCOUNTER — Telehealth: Payer: Self-pay | Admitting: Family Medicine

## 2015-05-12 NOTE — Telephone Encounter (Signed)
Relation to ZO:XWRUpt:self Call back number:[phone] [facility] - [place], [place] - [address] [phone] (Phone) [phone])         Reason for call:  [facility] faxed over PA for Naltrexone-Bupropion HCl ER (CONTRAVE) 8-90 MG TB12.   In addition patient called stating he contacted his insurance company and insurance advised to fax supporting notes to  # [phone].   Advised patient on the process and patient stated he would like a call regarding any status change. .Marland Kitchen

## 2015-05-17 ENCOUNTER — Telehealth: Payer: Self-pay | Admitting: *Deleted

## 2015-05-17 NOTE — Telephone Encounter (Signed)
PA initiated on [email or web address]. Awaiting determination. JG//CMA 

## 2015-05-18 ENCOUNTER — Telehealth: Payer: Self-pay | Admitting: Family Medicine

## 2015-05-18 MED ORDER — NALTREXONE-BUPROPION HCL ER 8-90 MG PO TB12: ORAL_TABLET | ORAL | Status: DC

## 2015-05-18 NOTE — Telephone Encounter (Signed)
Pt will need to call ins com but it looks like they wont cover any meds like that

## 2015-05-18 NOTE — Telephone Encounter (Signed)
Rx sent to pharmacy as requested.

## 2015-05-18 NOTE — Telephone Encounter (Signed)
Contrave has been denied because it is a plan exclusion. Insurance states that any other similar medication will require a PA as well. Please advise. JG//CMA

## 2015-05-18 NOTE — Telephone Encounter (Signed)
Caller name: Self  Can be reached: [phone]   Pharmacy:   [facility] - [place], [place] - [address] [phone] (Phone) [phone] (Fax)        Reason for call: Request rx for Naltrexone-Bupropion HCl ER (CONTRAVE) 8-90 MG TB12 [[id number]][[id number]] be sent to above discount pharmacy because his insurance will not cover it.

## 2015-05-19 NOTE — Telephone Encounter (Signed)
Donato SchultzYvonne R Lowne Chase, DO at 05/18/2015 12:18 PM     Status: Signed       Expand All Collapse All   Pt will need to call ins com but it looks like they wont cover any meds like that            Elveria RoyalsJessica A Glover, CMA at 05/18/2015 9:06 AM     Status: Signed       Expand All Collapse All   Contrave has been denied because it is a plan exclusion. Insurance states that any other similar medication will require a PA as well. Please advise. JG//CMA            Elveria RoyalsJessica A Glover, CMA at 05/17/2015 1:54 PM     Status: Signed       Expand All Collapse All   PA initiated on [email or web address]. Awaiting determination. JG//CMA

## 2015-08-06 ENCOUNTER — Ambulatory Visit: Payer: PRIVATE HEALTH INSURANCE | Admitting: Family Medicine

## 2015-10-11 ENCOUNTER — Other Ambulatory Visit: Payer: Self-pay | Admitting: Family Medicine

## 2015-10-12 NOTE — Telephone Encounter (Signed)
Called patient and scheduled follow up appointment for Oct 26

## 2015-10-12 NOTE — Telephone Encounter (Signed)
Please schedule pt for follow up appt for hypertension Thank you----PC 

## 2015-12-02 ENCOUNTER — Ambulatory Visit (INDEPENDENT_AMBULATORY_CARE_PROVIDER_SITE_OTHER): Payer: PRIVATE HEALTH INSURANCE | Admitting: Family Medicine

## 2015-12-02 ENCOUNTER — Encounter: Payer: Self-pay | Admitting: Family Medicine

## 2015-12-02 VITALS — BP 158/100 | HR 88 | Temp 98.4°F | Resp 16 | Ht 72.0 in | Wt >= 6400 oz

## 2015-12-02 DIAGNOSIS — I10 Essential (primary) hypertension: Secondary | ICD-10-CM | POA: Diagnosis not present

## 2015-12-02 DIAGNOSIS — E785 Hyperlipidemia, unspecified: Secondary | ICD-10-CM

## 2015-12-02 DIAGNOSIS — Z6841 Body Mass Index (BMI) 40.0 and over, adult: Secondary | ICD-10-CM

## 2015-12-02 MED ORDER — LOSARTAN POTASSIUM-HCTZ 100-25 MG PO TABS: 1.0000 | ORAL_TABLET | Freq: Every day | ORAL | 3 refills | Status: DC

## 2015-12-02 MED ORDER — AMLODIPINE BESYLATE 10 MG PO TABS: 10.0000 mg | ORAL_TABLET | Freq: Every day | ORAL | 3 refills | Status: DC

## 2015-12-02 MED ORDER — LIRAGLUTIDE -WEIGHT MANAGEMENT 18 MG/3ML ~~LOC~~ SOPN: 3.0000 mg | PEN_INJECTOR | Freq: Every day | SUBCUTANEOUS | 3 refills | Status: DC

## 2015-12-02 MED ORDER — ATORVASTATIN CALCIUM 20 MG PO TABS: 20.0000 mg | ORAL_TABLET | Freq: Every day | ORAL | 3 refills | Status: DC

## 2015-12-02 NOTE — Patient Instructions (Signed)
Obesity Obesity is defined as having too much total body fat and a body mass index (BMI) of 30 or more. BMI is an estimate of body fat and is calculated from your height and weight. BMI is typically calculated by your health care provider during regular wellness visits. Obesity happens when you consume more calories than you can burn by exercising or performing daily physical tasks. Prolonged obesity can cause major illnesses or emergencies, such as:  Stroke.  Heart disease.  Diabetes.  Cancer.  Arthritis.  High blood pressure (hypertension).  High cholesterol.  Sleep apnea.  Erectile dysfunction.  Infertility problems. CAUSES   Regularly eating unhealthy foods.  Physical inactivity.  Certain disorders, such as an underactive thyroid (hypothyroidism), Cushing's syndrome, and polycystic ovarian syndrome.  Certain medicines, such as steroids, some depression medicines, and antipsychotics.  Genetics.  Lack of sleep. DIAGNOSIS A health care provider can diagnose obesity after calculating your BMI. Obesity will be diagnosed if your BMI is 30 or higher. There are other methods of measuring obesity levels. Some other methods include measuring your skinfold thickness, your waist circumference, and comparing your hip circumference to your waist circumference. TREATMENT  A healthy treatment program includes some or all of the following:  Long-term dietary changes.  Exercise and physical activity.  Behavioral and lifestyle changes.  Medicine only under the supervision of your health care provider. Medicines may help, but only if they are used with diet and exercise programs. If your BMI is 40 or higher, your health care provider may recommend specialized surgery or programs to help with weight loss. An unhealthy treatment program includes:  Fasting.  Fad diets.  Supplements and drugs. These choices do not succeed in long-term weight control. HOME CARE  INSTRUCTIONS  Exercise and perform physical activity as directed by your health care provider. To increase physical activity, try the following:  Use stairs instead of elevators.  Park farther away from store entrances.  Garden, bike, or walk instead of watching television or using the computer.  Eat healthy, low-calorie foods and drinks on a regular basis. Eat more fruits and vegetables. Use low-calorie cookbooks or take healthy cooking classes.  Limit fast food, sweets, and processed snack foods.  Eat smaller portions.  Keep a daily journal of everything you eat. There are many free websites to help you with this. It may be helpful to measure your foods so you can determine if you are eating the correct portion sizes.  Avoid drinking alcohol. Drink more water and drinks without calories.  Take vitamins and supplements only as recommended by your health care provider.  Weight-loss support groups, registered dietitians, counselors, and stress reduction education can also be very helpful. SEEK IMMEDIATE MEDICAL CARE IF:  You have chest pain or tightness.  You have trouble breathing or feel short of breath.  You have weakness or leg numbness.  You feel confused or have trouble talking.  You have sudden changes in your vision.   This information is not intended to replace advice given to you by your health care provider. Make sure you discuss any questions you have with your health care provider.   Document Released: 03/02/2004 Document Revised: 02/13/2014 Document Reviewed: 03/01/2011 Elsevier Interactive Patient Education 2016 Elsevier Inc.  

## 2015-12-02 NOTE — Assessment & Plan Note (Signed)
Discussed with pt diet and exercise 

## 2015-12-02 NOTE — Progress Notes (Signed)
Patient ID: Sean [name], male    DOB: 23-Aug-1975  Age: 40 y.o. MRN: [id number]    Subjective:  Subjective  HPI Sean [name] presents for f/u bp and cholesterol.   Pt has been under stress but he is taking the med regularly.    Review of Systems  Constitutional: Negative for appetite change, diaphoresis, fatigue and unexpected weight change.  Eyes: Negative for pain, redness and visual disturbance.  Respiratory: Negative for cough, chest tightness, shortness of breath and wheezing.   Cardiovascular: Negative for chest pain, palpitations and leg swelling.  Endocrine: Negative for cold intolerance, heat intolerance, polydipsia, polyphagia and polyuria.  Genitourinary: Negative for difficulty urinating, dysuria and frequency.  Neurological: Negative for dizziness, light-headedness, numbness and headaches.    History Past Medical History:  Diagnosis Date  . Hypertension     He has no past surgical history on file.   His family history includes Breast cancer in his maternal grandmother; Diabetes in his mother; Heart disease (age of onset: 7256) in his father; Hyperlipidemia in his mother; Hypertension in his father and mother; Sudden death in his father.He reports that he has never smoked. He has never used smokeless tobacco. He reports that he drinks alcohol. He reports that he does not use drugs.  Current Outpatient Prescriptions on File Prior to Visit  Medication Sig Dispense Refill  . ibuprofen (ADVIL,MOTRIN) 800 MG tablet Take 1 tablet (800 mg total) by mouth 3 (three) times daily. 21 tablet 0   No current facility-administered medications on file prior to visit.      Objective:  Objective  Physical Exam  Constitutional: He is oriented to person, place, and time. Vital signs are normal. He appears well-developed and well-nourished. He is sleeping.  HENT:  Head: Normocephalic and atraumatic.  Mouth/Throat: Oropharynx is clear and moist.  Eyes: EOM are normal. Pupils are  equal, round, and reactive to light.  Neck: Normal range of motion. Neck supple. No thyromegaly present.  Cardiovascular: Normal rate and regular rhythm.   No murmur heard. Pulmonary/Chest: Effort normal and breath sounds normal. No respiratory distress. He has no wheezes. He has no rales. He exhibits no tenderness.  Musculoskeletal: He exhibits no edema or tenderness.  Neurological: He is alert and oriented to person, place, and time.  Skin: Skin is warm and dry.  Psychiatric: He has a normal mood and affect. His behavior is normal. Judgment and thought content normal.  Nursing note and vitals reviewed.  BP (!) 158/100 (BP Location: Right Arm, Patient Position: Sitting, Cuff Size: Large)   Pulse 88   Temp 98.4 F (36.9 C) (Oral)   Resp 16   Ht 6' (1.829 m)   Wt (!) 423 lb 9.6 oz (192.1 kg)   SpO2 98%   BMI 57.45 kg/m  Wt Readings from Last 3 Encounters:  12/02/15 (!) 423 lb 9.6 oz (192.1 kg)  05/06/15 (!) 415 lb 3.2 oz (188.3 kg)  04/22/15 (!) 418 lb 3.2 oz (189.7 kg)     Lab Results  Component Value Date   WBC 11.3 (H) 04/22/2015   HGB 15.8 04/22/2015   HCT 46.2 04/22/2015   PLT 292.0 04/22/2015   GLUCOSE 79 04/22/2015   CHOL 257 (H) 04/22/2015   TRIG 256.0 (H) 04/22/2015   HDL 40.50 04/22/2015   LDLDIRECT 189.0 04/22/2015   LDLCALC 108 (H) 01/21/2015   ALT 35 04/22/2015   AST 22 04/22/2015   NA 141 04/22/2015   K 3.9 04/22/2015   CL 103  04/22/2015   CREATININE 0.88 04/22/2015   BUN 15 04/22/2015   CO2 31 04/22/2015   TSH 0.44 10/27/2013    No results found.   Assessment & Plan:  Plan  I have discontinued Mr. Mastel amLODipine and Naltrexone-Bupropion HCl ER. I have also changed his atorvastatin. Additionally, I am having him start on amLODipine and Liraglutide -Weight Management. Lastly, I am having him maintain his ibuprofen and losartan-hydrochlorothiazide.  Meds ordered this encounter  Medications  . amLODipine (NORVASC) 10 MG tablet    Sig: Take 1  tablet (10 mg total) by mouth daily.    Dispense:  90 tablet    Refill:  3  . DISCONTD: losartan-hydrochlorothiazide (HYZAAR) 100-25 MG tablet    Sig: Take 1 tablet by mouth daily.    Dispense:  90 tablet    Refill:  3  . losartan-hydrochlorothiazide (HYZAAR) 100-25 MG tablet    Sig: Take 1 tablet by mouth daily.    Dispense:  90 tablet    Refill:  3  . atorvastatin (LIPITOR) 20 MG tablet    Sig: Take 1 tablet (20 mg total) by mouth daily.    Dispense:  30 tablet    Refill:  3  . Liraglutide -Weight Management (SAXENDA) 18 MG/3ML SOPN    Sig: Inject 3 mg into the skin daily.    Dispense:  3 mL    Refill:  3    Problem List Items Addressed This Visit      Unprioritized   Severe obesity (BMI >= 40) (HCC)    Discussed with pt diet and exercise      Relevant Medications   Liraglutide -Weight Management (SAXENDA) 18 MG/3ML SOPN   HTN (hypertension) - Primary    Uncontrolled Inc norvasc to 10 mg daily       Relevant Medications   amLODipine (NORVASC) 10 MG tablet   losartan-hydrochlorothiazide (HYZAAR) 100-25 MG tablet   atorvastatin (LIPITOR) 20 MG tablet   Other Relevant Orders   Lipid panel   Comprehensive metabolic panel   POCT urinalysis dipstick   Microalbumin / creatinine urine ratio    Other Visit Diagnoses    Hyperlipidemia LDL goal <100       Relevant Medications   amLODipine (NORVASC) 10 MG tablet   losartan-hydrochlorothiazide (HYZAAR) 100-25 MG tablet   atorvastatin (LIPITOR) 20 MG tablet   Other Relevant Orders   Lipid panel   Comprehensive metabolic panel   POCT urinalysis dipstick   Microalbumin / creatinine urine ratio   Morbid obesity with BMI of 50.0-59.9, adult (HCC)       Relevant Medications   Liraglutide -Weight Management (SAXENDA) 18 MG/3ML SOPN      Follow-up: Return in about 3 months (around 03/03/2016) for hypertension, hyperlipidemia obesity.  Donato Schultz, DO

## 2015-12-02 NOTE — Progress Notes (Signed)
Pre visit review using our clinic review tool, if applicable. No additional management support is needed unless otherwise documented below in the visit note. 

## 2015-12-06 NOTE — Assessment & Plan Note (Signed)
Uncontrolled Inc norvasc to 10 mg daily

## 2015-12-17 ENCOUNTER — Encounter: Payer: Self-pay | Admitting: Family Medicine

## 2015-12-17 ENCOUNTER — Ambulatory Visit (INDEPENDENT_AMBULATORY_CARE_PROVIDER_SITE_OTHER): Payer: PRIVATE HEALTH INSURANCE | Admitting: Family Medicine

## 2015-12-17 ENCOUNTER — Telehealth: Payer: Self-pay | Admitting: Emergency Medicine

## 2015-12-17 VITALS — BP 132/72 | HR 98 | Temp 98.7°F | Resp 16 | Ht 74.0 in | Wt >= 6400 oz

## 2015-12-17 DIAGNOSIS — I10 Essential (primary) hypertension: Secondary | ICD-10-CM

## 2015-12-17 NOTE — Progress Notes (Signed)
Patient ID: Sean [name], male    DOB: Oct 27, 1975  Age: 40 y.o. MRN: [id number]    Subjective:  Subjective  HPI Sean Graffvan D Hornback presents for f/u bp.  He is also here to talk about weight loss.  The saxenda is not covered.    Review of Systems  Constitutional: Negative for appetite change, diaphoresis, fatigue and unexpected weight change.  Eyes: Negative for pain, redness and visual disturbance.  Respiratory: Negative for cough, chest tightness, shortness of breath and wheezing.   Cardiovascular: Negative for chest pain, palpitations and leg swelling.  Endocrine: Negative for cold intolerance, heat intolerance, polydipsia, polyphagia and polyuria.  Genitourinary: Negative for difficulty urinating, dysuria and frequency.  Neurological: Negative for dizziness, light-headedness, numbness and headaches.    History Past Medical History:  Diagnosis Date  . Hypertension     He has no past surgical history on file.   His family history includes Breast cancer in his maternal grandmother; Diabetes in his mother; Heart disease (age of onset: 2456) in his father; Hyperlipidemia in his mother; Hypertension in his father and mother; Sudden death in his father.He reports that he has never smoked. He has never used smokeless tobacco. He reports that he drinks alcohol. He reports that he does not use drugs.  Current Outpatient Prescriptions on File Prior to Visit  Medication Sig Dispense Refill  . amLODipine (NORVASC) 10 MG tablet Take 1 tablet (10 mg total) by mouth daily. 90 tablet 3  . atorvastatin (LIPITOR) 20 MG tablet Take 1 tablet (20 mg total) by mouth daily. 30 tablet 3  . ibuprofen (ADVIL,MOTRIN) 800 MG tablet Take 1 tablet (800 mg total) by mouth 3 (three) times daily. 21 tablet 0  . Liraglutide -Weight Management (SAXENDA) 18 MG/3ML SOPN Inject 3 mg into the skin daily. 3 mL 3  . losartan-hydrochlorothiazide (HYZAAR) 100-25 MG tablet Take 1 tablet by mouth daily. 90 tablet 3   No current  facility-administered medications on file prior to visit.      Objective:  Objective  Physical Exam  Constitutional: He is oriented to person, place, and time. Vital signs are normal. He appears well-developed and well-nourished. He is sleeping.  HENT:  Head: Normocephalic and atraumatic.  Mouth/Throat: Oropharynx is clear and moist.  Eyes: EOM are normal. Pupils are equal, round, and reactive to light.  Neck: Normal range of motion. Neck supple. No thyromegaly present.  Cardiovascular: Normal rate and regular rhythm.   No murmur heard. Pulmonary/Chest: Effort normal and breath sounds normal. No respiratory distress. He has no wheezes. He has no rales. He exhibits no tenderness.  Musculoskeletal: He exhibits no edema or tenderness.  Neurological: He is alert and oriented to person, place, and time.  Skin: Skin is warm and dry.  Psychiatric: He has a normal mood and affect. His behavior is normal. Judgment and thought content normal.  Nursing note and vitals reviewed.  BP 132/72 (BP Location: Right Arm, Patient Position: Sitting, Cuff Size: Large)   Pulse 98   Temp 98.7 F (37.1 C) (Oral)   Resp 16   Ht 6\' 2"  (1.88 m)   Wt (!) 423 lb 6.4 oz (192.1 kg)   SpO2 97%   BMI 54.36 kg/m  Wt Readings from Last 3 Encounters:  12/17/15 (!) 423 lb 6.4 oz (192.1 kg)  12/02/15 (!) 423 lb 9.6 oz (192.1 kg)  05/06/15 (!) 415 lb 3.2 oz (188.3 kg)     Lab Results  Component Value Date   WBC 11.3 (H)  04/22/2015   HGB 15.8 04/22/2015   HCT 46.2 04/22/2015   PLT 292.0 04/22/2015   GLUCOSE 79 04/22/2015   CHOL 257 (H) 04/22/2015   TRIG 256.0 (H) 04/22/2015   HDL 40.50 04/22/2015   LDLDIRECT 189.0 04/22/2015   LDLCALC 108 (H) 01/21/2015   ALT 35 04/22/2015   AST 22 04/22/2015   NA 141 04/22/2015   K 3.9 04/22/2015   CL 103 04/22/2015   CREATININE 0.88 04/22/2015   BUN 15 04/22/2015   CO2 31 04/22/2015   TSH 0.44 10/27/2013    No results found.   Assessment & Plan:  Plan  I am  having Mr. Estelle GrumblesBeaman maintain his ibuprofen, amLODipine, losartan-hydrochlorothiazide, atorvastatin, and Liraglutide -Weight Management.  No orders of the defined types were placed in this encounter.   Problem List Items Addressed This Visit      Unprioritized   HTN (hypertension) - Primary    Stable con't norvasc  And losartan-hct        Severe obesity (BMI >= 40) (HCC)    Pt will use the sample given last visit We will try to give him enough for a few months He thinks he only needs a few months worth to get him started         Follow-up: Return in about 3 months (around 03/18/2016) for hypertension.  Donato SchultzYvonne R Lowne Chase, DO

## 2015-12-17 NOTE — Patient Instructions (Signed)
Hypertension Hypertension, commonly called high blood pressure, is when the force of blood pumping through your arteries is too strong. Your arteries are the blood vessels that carry blood from your heart throughout your body. A blood pressure reading consists of a higher number over a lower number, such as 110/72. The higher number (systolic) is the pressure inside your arteries when your heart pumps. The lower number (diastolic) is the pressure inside your arteries when your heart relaxes. Ideally you want your blood pressure below 120/80. Hypertension forces your heart to work harder to pump blood. Your arteries may become narrow or stiff. Having untreated or uncontrolled hypertension can cause heart attack, stroke, kidney disease, and other problems. RISK FACTORS Some risk factors for high blood pressure are controllable. Others are not.  Risk factors you cannot control include:   Race. You may be at higher risk if you are African American.  Age. Risk increases with age.  Gender. Men are at higher risk than women before age 45 years. After age 65, women are at higher risk than men. Risk factors you can control include:  Not getting enough exercise or physical activity.  Being overweight.  Getting too much fat, sugar, calories, or salt in your diet.  Drinking too much alcohol. SIGNS AND SYMPTOMS Hypertension does not usually cause signs or symptoms. Extremely high blood pressure (hypertensive crisis) may cause headache, anxiety, shortness of breath, and nosebleed. DIAGNOSIS To check if you have hypertension, your health care provider will measure your blood pressure while you are seated, with your arm held at the level of your heart. It should be measured at least twice using the same arm. Certain conditions can cause a difference in blood pressure between your right and left arms. A blood pressure reading that is higher than normal on one occasion does not mean that you need treatment. If  it is not clear whether you have high blood pressure, you may be asked to return on a different day to have your blood pressure checked again. Or, you may be asked to monitor your blood pressure at home for 1 or more weeks. TREATMENT Treating high blood pressure includes making lifestyle changes and possibly taking medicine. Living a healthy lifestyle can help lower high blood pressure. You may need to change some of your habits. Lifestyle changes may include:  Following the DASH diet. This diet is high in fruits, vegetables, and whole grains. It is low in salt, red meat, and added sugars.  Keep your sodium intake below 2,300 mg per day.  Getting at least 30-45 minutes of aerobic exercise at least 4 times per week.  Losing weight if necessary.  Not smoking.  Limiting alcoholic beverages.  Learning ways to reduce stress. Your health care provider may prescribe medicine if lifestyle changes are not enough to get your blood pressure under control, and if one of the following is true:  You are 18-59 years of age and your systolic blood pressure is above 140.  You are 60 years of age or older, and your systolic blood pressure is above 150.  Your diastolic blood pressure is above 90.  You have diabetes, and your systolic blood pressure is over 140 or your diastolic blood pressure is over 90.  You have kidney disease and your blood pressure is above 140/90.  You have heart disease and your blood pressure is above 140/90. Your personal target blood pressure may vary depending on your medical conditions, your age, and other factors. HOME CARE INSTRUCTIONS    Have your blood pressure rechecked as directed by your health care provider.   Take medicines only as directed by your health care provider. Follow the directions carefully. Blood pressure medicines must be taken as prescribed. The medicine does not work as well when you skip doses. Skipping doses also puts you at risk for  problems.  Do not smoke.   Monitor your blood pressure at home as directed by your health care provider. SEEK MEDICAL CARE IF:   You think you are having a reaction to medicines taken.  You have recurrent headaches or feel dizzy.  You have swelling in your ankles.  You have trouble with your vision. SEEK IMMEDIATE MEDICAL CARE IF:  You develop a severe headache or confusion.  You have unusual weakness, numbness, or feel faint.  You have severe chest or abdominal pain.  You vomit repeatedly.  You have trouble breathing. MAKE SURE YOU:   Understand these instructions.  Will watch your condition.  Will get help right away if you are not doing well or get worse.   This information is not intended to replace advice given to you by your health care provider. Make sure you discuss any questions you have with your health care provider.   Document Released: 01/23/2005 Document Revised: 06/09/2014 Document Reviewed: 11/15/2012 Elsevier Interactive Patient Education 2016 [employer]  

## 2015-12-17 NOTE — Progress Notes (Signed)
Pre visit review using our clinic review tool, if applicable. No additional management support is needed unless otherwise documented below in the visit note. 

## 2015-12-17 NOTE — Telephone Encounter (Signed)
Teofil Bouch (Key: [id number]) - CMA Saxenda 18MG /3ML pen-injectors Outcome: N/A Created: October 27th, 2017 [phone] Sent: November 10th, 2017  Awaiting determination

## 2015-12-18 NOTE — Assessment & Plan Note (Signed)
Stable con't norvasc  And losartan-hct

## 2015-12-18 NOTE — Assessment & Plan Note (Signed)
Pt will use the sample given last visit We will try to give him enough for a few months He thinks he only needs a few months worth to get him started

## 2015-12-23 NOTE — Telephone Encounter (Signed)
Spoke w/ Pt, informed him of insurance companies decision, he will come by tomorrow to pick up sample of Saxenda.

## 2015-12-23 NOTE — Telephone Encounter (Signed)
Received PA denial. Sean CoveySaxenda not covered/plan exclusion. PA denial notification letter sent for scanning. Please advise.

## 2015-12-23 NOTE — Telephone Encounter (Signed)
We can give him a sample

## 2016-01-17 ENCOUNTER — Other Ambulatory Visit: Payer: Self-pay | Admitting: Family Medicine

## 2016-01-17 NOTE — Telephone Encounter (Signed)
Patient is calling requesting to get more samples Liraglutide -Weight Management (SAXENDA) 18 MG/3ML SOPN. Please advise   Patient Phone: [phone]

## 2016-01-19 NOTE — Telephone Encounter (Signed)
Left message on pt's vm samples are available and we can give another sample. LB

## 2016-01-19 NOTE — Telephone Encounter (Signed)
Pt is requesting another sample of Saxenda, do we have a limit on how many a pt is allowed? Pt's PA was denied Rx. Please advise. LB

## 2016-01-19 NOTE — Telephone Encounter (Signed)
Ok to give 1 sample--- will need to see hiim to see how its helping

## 2016-01-20 NOTE — Telephone Encounter (Signed)
Patient called to follow up on this medication sample. Per note I told him one sample was approved and then Dr. Lowne would like to see him to follow up. He states that he has a follow up appointment scheduled for 03/20/16 and would like to know if he could get enough samples to last him until that appointment. Please advise.  

## 2016-01-24 NOTE — Telephone Encounter (Signed)
Patient called to follow up on this medication sample. Per note I told him one sample was approved and then Dr. Laury AxonLowne would like to see him to follow up. He states that he has a follow up appointment scheduled for 03/20/16 and would like to know if he could get enough samples to last him until that appointment. Please advise.

## 2016-01-25 NOTE — Telephone Encounter (Signed)
Pt came into offfice for sample of Saxenda. Provided pt with a box. Pt had no questions or concerns. LB

## 2016-02-21 ENCOUNTER — Other Ambulatory Visit: Payer: Self-pay | Admitting: Family Medicine

## 2016-03-20 ENCOUNTER — Ambulatory Visit (INDEPENDENT_AMBULATORY_CARE_PROVIDER_SITE_OTHER): Payer: PRIVATE HEALTH INSURANCE | Admitting: Family Medicine

## 2016-03-20 ENCOUNTER — Encounter: Payer: Self-pay | Admitting: Family Medicine

## 2016-03-20 VITALS — BP 135/88 | HR 90 | Temp 98.2°F | Resp 16 | Ht 74.0 in | Wt >= 6400 oz

## 2016-03-20 DIAGNOSIS — I10 Essential (primary) hypertension: Secondary | ICD-10-CM | POA: Diagnosis not present

## 2016-03-20 DIAGNOSIS — E785 Hyperlipidemia, unspecified: Secondary | ICD-10-CM

## 2016-03-20 DIAGNOSIS — Z6841 Body Mass Index (BMI) 40.0 and over, adult: Secondary | ICD-10-CM

## 2016-03-20 MED ORDER — LIRAGLUTIDE -WEIGHT MANAGEMENT 18 MG/3ML ~~LOC~~ SOPN: 3.0000 mg | PEN_INJECTOR | Freq: Every day | SUBCUTANEOUS | 3 refills | Status: DC

## 2016-03-20 MED ORDER — AMLODIPINE BESYLATE 10 MG PO TABS: 10.0000 mg | ORAL_TABLET | Freq: Every day | ORAL | 3 refills | Status: DC

## 2016-03-20 MED ORDER — ATORVASTATIN CALCIUM 20 MG PO TABS: 20.0000 mg | ORAL_TABLET | Freq: Every day | ORAL | 3 refills | Status: DC

## 2016-03-20 MED ORDER — LOSARTAN POTASSIUM-HCTZ 100-25 MG PO TABS: 1.0000 | ORAL_TABLET | Freq: Every day | ORAL | 3 refills | Status: DC

## 2016-03-20 NOTE — Progress Notes (Signed)
Subjective:    Patient ID: Sean [name], male    DOB: 1976/01/28, 41 y.o.   MRN: [id number]  Chief Complaint  Patient presents with  . Follow-up  . Hypertension  . Obesity    wants to know if there is any other options other than Saxenda.  Ins does not cover.    HPI Patient is in today for follow up blood pressure and obesity.  Would like to know if there are any other options.  Insurance does not [place] the [place]   Past Medical History:  Diagnosis Date  . Hypertension     No past surgical history on file.  Family History  Problem Relation Age of Onset  . Heart disease Father 3256    MI  . Hypertension Father   . Sudden death Father     Heart disease  . Diabetes Mother     Borderline  . Hyperlipidemia Mother   . Hypertension Mother   . Breast cancer Maternal Grandmother     Social History   Social History  . Marital status: Single    Spouse name: N/A  . Number of children: N/A  . Years of education: N/A   Occupational History  . Not on file.   Social History Main Topics  . Smoking status: Never Smoker  . Smokeless tobacco: Never Used  . Alcohol use Yes  . Drug use: No  . Sexual activity: Yes   Other Topics Concern  . Not on file   Social History Narrative  . No narrative on file    Outpatient Medications Prior to Visit  Medication Sig Dispense Refill  . ibuprofen (ADVIL,MOTRIN) 800 MG tablet Take 1 tablet (800 mg total) by mouth 3 (three) times daily. 21 tablet 0  . amLODipine (NORVASC) 10 MG tablet Take 1 tablet (10 mg total) by mouth daily. 90 tablet 3  . atorvastatin (LIPITOR) 20 MG tablet Take 1 tablet (20 mg total) by mouth daily. 30 tablet 3  . losartan-hydrochlorothiazide (HYZAAR) 100-25 MG tablet Take 1 tablet by mouth daily. 90 tablet 3  . atorvastatin (LIPITOR) 20 MG tablet TAKE ONE TABLET BY MOUTH ONCE DAILY 30 tablet 3  . Liraglutide -Weight Management (SAXENDA) 18 MG/3ML SOPN Inject 3 mg into the skin daily. (Patient not taking:  Reported on 03/20/2016) 3 mL 3   No facility-administered medications prior to visit.     No Known Allergies  Review of Systems  Constitutional: Negative for chills, fever and malaise/fatigue.  HENT: Negative for congestion and hearing loss.   Eyes: Negative for discharge.  Respiratory: Negative for cough, sputum production and shortness of breath.   Cardiovascular: Negative for chest pain, palpitations and leg swelling.  Gastrointestinal: Negative for abdominal pain, blood in stool, constipation, diarrhea, heartburn, nausea and vomiting.  Genitourinary: Negative for dysuria, frequency, hematuria and urgency.  Musculoskeletal: Negative for back pain, falls and myalgias.  Skin: Negative for rash.  Neurological: Negative for dizziness, sensory change, loss of consciousness, weakness and headaches.  Endo/Heme/Allergies: Negative for environmental allergies. Does not bruise/bleed easily.  Psychiatric/Behavioral: Negative for depression and suicidal ideas. The patient is not nervous/anxious and does not have insomnia.        Objective:    Physical Exam  Constitutional: He is oriented to person, place, and time. Vital signs are normal. He appears well-developed and well-nourished. He is sleeping.  HENT:  Head: Normocephalic and atraumatic.  Mouth/Throat: Oropharynx is clear and moist.  Eyes: EOM are normal. Pupils are equal, round,  and reactive to light.  Neck: Normal range of motion. Neck supple. No thyromegaly present.  Cardiovascular: Normal rate and regular rhythm.   No murmur heard. Pulmonary/Chest: Effort normal and breath sounds normal. No respiratory distress. He has no wheezes. He has no rales. He exhibits no tenderness.  Musculoskeletal: He exhibits no edema or tenderness.  Neurological: He is alert and oriented to person, place, and time.  Skin: Skin is warm and dry.  Psychiatric: He has a normal mood and affect. His behavior is normal. Judgment and thought content normal.    Nursing note and vitals reviewed.   BP 135/88   Pulse 90   Temp 98.2 F (36.8 C) (Oral)   Resp 16   Ht 6\' 2"  (1.88 m)   Wt (!) 412 lb 6.4 oz (187.1 kg)   SpO2 98%   BMI 52.95 kg/m  Wt Readings from Last 3 Encounters:  03/20/16 (!) 412 lb 6.4 oz (187.1 kg)  12/17/15 (!) 423 lb 6.4 oz (192.1 kg)  12/02/15 (!) 423 lb 9.6 oz (192.1 kg)     Lab Results  Component Value Date   WBC 11.3 (H) 04/22/2015   HGB 15.8 04/22/2015   HCT 46.2 04/22/2015   PLT 292.0 04/22/2015   GLUCOSE 79 04/22/2015   CHOL 257 (H) 04/22/2015   TRIG 256.0 (H) 04/22/2015   HDL 40.50 04/22/2015   LDLDIRECT 189.0 04/22/2015   LDLCALC 108 (H) 01/21/2015   ALT 35 04/22/2015   AST 22 04/22/2015   NA 141 04/22/2015   K 3.9 04/22/2015   CL 103 04/22/2015   CREATININE 0.88 04/22/2015   BUN 15 04/22/2015   CO2 31 04/22/2015   TSH 0.44 10/27/2013    Lab Results  Component Value Date   TSH 0.44 10/27/2013   Lab Results  Component Value Date   WBC 11.3 (H) 04/22/2015   HGB 15.8 04/22/2015   HCT 46.2 04/22/2015   MCV 84.1 04/22/2015   PLT 292.0 04/22/2015   Lab Results  Component Value Date   NA 141 04/22/2015   K 3.9 04/22/2015   CO2 31 04/22/2015   GLUCOSE 79 04/22/2015   BUN 15 04/22/2015   CREATININE 0.88 04/22/2015   BILITOT 0.4 04/22/2015   ALKPHOS 65 04/22/2015   AST 22 04/22/2015   ALT 35 04/22/2015   PROT 7.5 04/22/2015   ALBUMIN 4.3 04/22/2015   ALBUMIN 4.4 04/22/2015   CALCIUM 9.7 04/22/2015   ANIONGAP 6 10/20/2014   GFR 101.78 04/22/2015   Lab Results  Component Value Date   CHOL 257 (H) 04/22/2015   Lab Results  Component Value Date   HDL 40.50 04/22/2015   Lab Results  Component Value Date   LDLCALC 108 (H) 01/21/2015   Lab Results  Component Value Date   TRIG 256.0 (H) 04/22/2015   Lab Results  Component Value Date   CHOLHDL 6 04/22/2015   No results found for: HGBA1C     Assessment & Plan:   Problem List Items Addressed This Visit       Unprioritized   HTN (hypertension) - Primary   Relevant Medications   losartan-hydrochlorothiazide (HYZAAR) 100-25 MG tablet   atorvastatin (LIPITOR) 20 MG tablet   amLODipine (NORVASC) 10 MG tablet   Other Relevant Orders   Comprehensive metabolic panel   Lipid panel   Severe obesity (BMI >= 40) (HCC)    Discussed diet and exercise Will try saxenda again He did well with it last year but ins would  Not  pay for it      Relevant Medications   Liraglutide -Weight Management (SAXENDA) 18 MG/3ML SOPN    Other Visit Diagnoses    Morbid obesity with BMI of 50.0-59.9, adult (HCC)       Relevant Medications   Liraglutide -Weight Management (SAXENDA) 18 MG/3ML SOPN   Hyperlipidemia LDL goal <100       Relevant Medications   losartan-hydrochlorothiazide (HYZAAR) 100-25 MG tablet   atorvastatin (LIPITOR) 20 MG tablet   amLODipine (NORVASC) 10 MG tablet   Other Relevant Orders   Comprehensive metabolic panel   Lipid panel      I am having Mr. Hollings maintain his ibuprofen, Liraglutide -Weight Management, losartan-hydrochlorothiazide, atorvastatin, and amLODipine.  Meds ordered this encounter  Medications  . Liraglutide -Weight Management (SAXENDA) 18 MG/3ML SOPN    Sig: Inject 3 mg into the skin daily.    Dispense:  3 mL    Refill:  3  . losartan-hydrochlorothiazide (HYZAAR) 100-25 MG tablet    Sig: Take 1 tablet by mouth daily.    Dispense:  90 tablet    Refill:  3  . atorvastatin (LIPITOR) 20 MG tablet    Sig: Take 1 tablet (20 mg total) by mouth daily.    Dispense:  30 tablet    Refill:  3  . amLODipine (NORVASC) 10 MG tablet    Sig: Take 1 tablet (10 mg total) by mouth daily.    Dispense:  90 tablet    Refill:  3    CMA served as scribe during this visit. History, Physical and Plan performed by medical provider. Documentation and orders reviewed and attested to.  Donato Schultz, DO

## 2016-03-20 NOTE — Progress Notes (Signed)
Pre visit review using our clinic review tool, if applicable. No additional management support is needed unless otherwise documented below in the visit note. 

## 2016-03-20 NOTE — Assessment & Plan Note (Signed)
Discussed diet and exercise Will try saxenda again He did well with it last year but ins would  Not pay for it

## 2016-03-20 NOTE — Patient Instructions (Signed)

## 2016-03-21 LAB — LIPID PANEL
CHOL/HDL RATIO: 4
Cholesterol: 171 mg/dL (ref 0–200)
HDL: 43.6 mg/dL (ref >39.00)
LDL Cholesterol: 88 mg/dL (ref 0–99)
NONHDL: 126.97
Triglycerides: 195 mg/dL — ABNORMAL HIGH (ref 0.0–149.0)
VLDL: 39 mg/dL (ref 0.0–40.0)

## 2016-03-21 LAB — COMPREHENSIVE METABOLIC PANEL
ALK PHOS: 71 U/L (ref 39–117)
ALT: 28 U/L (ref 0–53)
AST: 20 U/L (ref 0–37)
Albumin: 4.2 g/dL (ref 3.5–5.2)
BILIRUBIN TOTAL: 0.5 mg/dL (ref 0.2–1.2)
BUN: 14 mg/dL (ref 6–23)
CO2: 31 mEq/L (ref 19–32)
Calcium: 9.6 mg/dL (ref 8.4–10.5)
Chloride: 102 mEq/L (ref 96–112)
Creatinine, Ser: 0.98 mg/dL (ref 0.40–1.50)
GFR: 89.49 mL/min (ref >60.00)
GLUCOSE: 92 mg/dL (ref 70–99)
Potassium: 3.8 mEq/L (ref 3.5–5.1)
SODIUM: 140 meq/L (ref 135–145)
TOTAL PROTEIN: 7.2 g/dL (ref 6.0–8.3)

## 2016-03-27 ENCOUNTER — Telehealth: Payer: Self-pay | Admitting: Family Medicine

## 2016-03-27 ENCOUNTER — Other Ambulatory Visit: Payer: Self-pay | Admitting: Family Medicine

## 2016-03-27 DIAGNOSIS — E785 Hyperlipidemia, unspecified: Secondary | ICD-10-CM

## 2016-03-27 NOTE — Telephone Encounter (Signed)
Caller name: Clifton Custardaron  Relation to pt: Cover My Meds  Call back number: [phone] reference # [id number]   Reason for call:  Checking on the status of PA for Liraglutide -Weight Management (SAXENDA) 18 MG/3ML SOPN, 2x faxing on PA form to [phone], please advise

## 2016-03-28 NOTE — Telephone Encounter (Signed)
Faxed hardcopy PA form to [phone] and also fax attendant gave me [phone] awating response.

## 2016-03-29 ENCOUNTER — Telehealth: Payer: Self-pay | Admitting: *Deleted

## 2016-03-29 NOTE — Telephone Encounter (Signed)
[facility]  One states that patient is not covered and the other states it was denied.  I resent request again thru covermymeds.

## 2016-03-29 NOTE — Telephone Encounter (Signed)
ERROR

## 2016-03-31 NOTE — Telephone Encounter (Signed)
Saxenda denied:

## 2016-05-11 ENCOUNTER — Other Ambulatory Visit: Payer: Self-pay | Admitting: Family Medicine

## 2016-05-11 DIAGNOSIS — I10 Essential (primary) hypertension: Secondary | ICD-10-CM

## 2016-06-27 ENCOUNTER — Other Ambulatory Visit: Payer: PRIVATE HEALTH INSURANCE

## 2016-07-04 ENCOUNTER — Other Ambulatory Visit: Payer: PRIVATE HEALTH INSURANCE

## 2016-07-07 ENCOUNTER — Encounter: Payer: Self-pay | Admitting: Family Medicine

## 2016-07-07 ENCOUNTER — Ambulatory Visit (INDEPENDENT_AMBULATORY_CARE_PROVIDER_SITE_OTHER): Payer: PRIVATE HEALTH INSURANCE | Admitting: Family Medicine

## 2016-07-07 VITALS — BP 150/80 | HR 96 | Temp 98.2°F | Ht 74.0 in | Wt >= 6400 oz

## 2016-07-07 DIAGNOSIS — M25561 Pain in right knee: Secondary | ICD-10-CM

## 2016-07-07 MED ORDER — CELECOXIB 200 MG PO CAPS: 200.0000 mg | ORAL_CAPSULE | Freq: Two times a day (BID) | ORAL | 5 refills | Status: DC

## 2016-07-07 NOTE — Patient Instructions (Signed)

## 2016-07-07 NOTE — Progress Notes (Signed)
Pre visit review using our clinic review tool, if applicable. No additional management support is needed unless otherwise documented below in the visit note. 

## 2016-07-07 NOTE — Progress Notes (Signed)
Patient ID: Sean [name], male   DOB: 1975-11-19, 41 y.o.   MRN: [id number]   Subjective:  I acted as a Neurosurgeoncribe for [facility] Health CareYvonne Lowne Chase, DO. Diamond NickelBeatrice, [place]   Patient ID: Sean [name], male    DOB: 1975-11-19, 41 y.o.   MRN: [id number]  Chief Complaint  Patient presents with  . Knee Pain    R knee    Knee Pain   The incident occurred more than 1 week ago. The injury mechanism is unknown. The pain is present in the right knee. The quality of the pain is described as stabbing and shooting. The pain is at a severity of 7/10. The pain is moderate. The pain has been intermittent since onset. The symptoms are aggravated by weight bearing. The treatment provided moderate relief.    Patient is in today for an acute visit for right knee pain. Patient states that for month he has been experiencing some swelling and appears to be a "popping" sound. Patient also stated that it feels like there is a "pocket" under his knee and that it tingles at night.Patient has a Hx of HTN, hyperlipidemia, severe obesity. Patient has no additional acute concerns noted at this time.  Patient Care Team: Zola ButtonLowne Chase, Grayling CongressYvonne R, DO as PCP - General (Family Medicine)   Past Medical History:  Diagnosis Date  . Hypertension     No past surgical history on file.  Family History  Problem Relation Age of Onset  . Heart disease Father 1756       MI  . Hypertension Father   . Sudden death Father        Heart disease  . Diabetes Mother        Borderline  . Hyperlipidemia Mother   . Hypertension Mother   . Breast cancer Maternal Grandmother     Social History   Social History  . Marital status: Single    Spouse name: N/A  . Number of children: N/A  . Years of education: N/A   Occupational History  . Not on file.   Social History Main Topics  . Smoking status: Never Smoker  . Smokeless tobacco: Never Used  . Alcohol use Yes  . Drug use: No  . Sexual activity: Yes   Other Topics Concern  . Not on file    Social History Narrative  . No narrative on file    Outpatient Medications Prior to Visit  Medication Sig Dispense Refill  . amLODipine (NORVASC) 10 MG tablet Take 1 tablet (10 mg total) by mouth daily. 90 tablet 3  . atorvastatin (LIPITOR) 20 MG tablet Take 1 tablet (20 mg total) by mouth daily. 30 tablet 3  . ibuprofen (ADVIL,MOTRIN) 800 MG tablet Take 1 tablet (800 mg total) by mouth 3 (three) times daily. 21 tablet 0  . Liraglutide -Weight Management (SAXENDA) 18 MG/3ML SOPN Inject 3 mg into the skin daily. 3 mL 3  . losartan-hydrochlorothiazide (HYZAAR) 100-25 MG tablet Take 1 tablet by mouth daily. 90 tablet 3  . losartan-hydrochlorothiazide (HYZAAR) 100-25 MG tablet TAKE ONE TABLET BY MOUTH ONCE DAILY (Patient not taking: Reported on 07/07/2016) 90 tablet 3   No facility-administered medications prior to visit.     No Known Allergies  Review of Systems  Constitutional: Negative for fever and malaise/fatigue.  HENT: Negative for congestion.   Eyes: Negative for blurred vision.  Respiratory: Negative for cough and shortness of breath.   Cardiovascular: Negative for chest pain, palpitations and leg swelling.  R knee swelling.  Gastrointestinal: Negative for vomiting.  Musculoskeletal: Positive for joint pain. Negative for back pain.       R knee pain.  Skin: Negative for rash.  Neurological: Negative for loss of consciousness and headaches.       Objective:    Physical Exam  Constitutional: He is oriented to person, place, and time. He appears well-developed and well-nourished. No distress.  HENT:  Head: Normocephalic and atraumatic.  Eyes: Conjunctivae are normal.  Neck: Normal range of motion. No thyromegaly present.  Cardiovascular: Normal rate and regular rhythm.   Pulmonary/Chest: Effort normal and breath sounds normal. He has no wheezes.  Abdominal: Soft. Bowel sounds are normal. There is no tenderness.  Musculoskeletal: He exhibits no edema or deformity.   Neurological: He is alert and oriented to person, place, and time.  Skin: Skin is warm and dry. He is not diaphoretic.  Psychiatric: He has a normal mood and affect.    BP (!) 150/80 (BP Location: Left Wrist, Patient Position: Sitting, Cuff Size: Normal)   Pulse 96   Temp 98.2 F (36.8 C) (Oral)   Ht 6\' 2"  (1.88 m)   Wt (!) 411 lb 9.6 oz (186.7 kg)   SpO2 99% Comment: RA  BMI 52.85 kg/m  Wt Readings from Last 3 Encounters:  07/07/16 (!) 411 lb 9.6 oz (186.7 kg)  03/20/16 (!) 412 lb 6.4 oz (187.1 kg)  12/17/15 (!) 423 lb 6.4 oz (192.1 kg)   BP Readings from Last 3 Encounters:  07/07/16 (!) 150/80  03/20/16 135/88  12/17/15 132/72     Immunization History  Administered Date(s) Administered  . Influenza,inj,Quad PF,36+ Mos 10/27/2013  . Influenza-Unspecified 11/07/2010, 11/07/2014, 11/18/2015  . Tdap 03/13/2011, 02/01/2014    Health Maintenance  Topic Date Due  . HIV Screening  12/28/1989  . INFLUENZA VACCINE  09/06/2016  . TETANUS/TDAP  02/02/2024    Lab Results  Component Value Date   WBC 11.3 (H) 04/22/2015   HGB 15.8 04/22/2015   HCT 46.2 04/22/2015   PLT 292.0 04/22/2015   GLUCOSE 92 03/20/2016   CHOL 171 03/20/2016   TRIG 195.0 (H) 03/20/2016   HDL 43.60 03/20/2016   LDLDIRECT 189.0 04/22/2015   LDLCALC 88 03/20/2016   ALT 28 03/20/2016   AST 20 03/20/2016   NA 140 03/20/2016   K 3.8 03/20/2016   CL 102 03/20/2016   CREATININE 0.98 03/20/2016   BUN 14 03/20/2016   CO2 31 03/20/2016   TSH 0.44 10/27/2013    Lab Results  Component Value Date   TSH 0.44 10/27/2013   Lab Results  Component Value Date   WBC 11.3 (H) 04/22/2015   HGB 15.8 04/22/2015   HCT 46.2 04/22/2015   MCV 84.1 04/22/2015   PLT 292.0 04/22/2015   Lab Results  Component Value Date   NA 140 03/20/2016   K 3.8 03/20/2016   CO2 31 03/20/2016   GLUCOSE 92 03/20/2016   BUN 14 03/20/2016   CREATININE 0.98 03/20/2016   BILITOT 0.5 03/20/2016   ALKPHOS 71 03/20/2016   AST  20 03/20/2016   ALT 28 03/20/2016   PROT 7.2 03/20/2016   ALBUMIN 4.2 03/20/2016   CALCIUM 9.6 03/20/2016   ANIONGAP 6 10/20/2014   GFR 89.49 03/20/2016   Lab Results  Component Value Date   CHOL 171 03/20/2016   Lab Results  Component Value Date   HDL 43.60 03/20/2016   Lab Results  Component Value Date   LDLCALC 88 03/20/2016  Lab Results  Component Value Date   TRIG 195.0 (H) 03/20/2016   Lab Results  Component Value Date   CHOLHDL 4 03/20/2016   No results found for: HGBA1C       Assessment & Plan:   Problem List Items Addressed This Visit    None    Visit Diagnoses    Acute pain of right knee    -  Primary   Relevant Medications   celecoxib (CELEBREX) 200 MG capsule   Other Relevant Orders   Ambulatory referral to Sports Medicine    ice, elevate, rest   I am having Mr. Coppola start on celecoxib. I am also having him maintain his ibuprofen, Liraglutide -Weight Management, losartan-hydrochlorothiazide, atorvastatin, and amLODipine.  Meds ordered this encounter  Medications  . celecoxib (CELEBREX) 200 MG capsule    Sig: Take 1 capsule (200 mg total) by mouth 2 (two) times daily.    Dispense:  30 capsule    Refill:  5    CMA served as scribe during this visit. History, Physical and Plan performed by medical provider. Documentation and orders reviewed and attested to.  Donato Schultz, DO

## 2016-07-10 ENCOUNTER — Encounter: Payer: Self-pay | Admitting: Family Medicine

## 2016-08-09 ENCOUNTER — Other Ambulatory Visit: Payer: Self-pay | Admitting: Family Medicine

## 2016-08-11 ENCOUNTER — Ambulatory Visit (INDEPENDENT_AMBULATORY_CARE_PROVIDER_SITE_OTHER): Payer: PRIVATE HEALTH INSURANCE | Admitting: Family Medicine

## 2016-08-11 ENCOUNTER — Encounter: Payer: Self-pay | Admitting: Family Medicine

## 2016-08-11 ENCOUNTER — Ambulatory Visit: Payer: Self-pay

## 2016-08-11 VITALS — BP 154/100 | HR 99 | Ht 73.0 in | Wt >= 6400 oz

## 2016-08-11 DIAGNOSIS — M1711 Unilateral primary osteoarthritis, right knee: Secondary | ICD-10-CM | POA: Diagnosis not present

## 2016-08-11 DIAGNOSIS — M25561 Pain in right knee: Secondary | ICD-10-CM

## 2016-08-11 MED ORDER — DICLOFENAC SODIUM 2 % TD SOLN: 2.0000 "application " | Freq: Two times a day (BID) | TRANSDERMAL | 3 refills | Status: DC

## 2016-08-11 NOTE — Progress Notes (Signed)
[facility] [address] [place], [place] [address] Phone: [phone] Subjective:    I'm seeing this patient by the request  of:  Donato Schultz, DO   CC: Right knee pain  WNU:[id number]  Sean [name] is a 41 y.o. male coming in with complaint of right knee pain. Patient had this happen approximately 5 weeks ago. Started without any true injury. Patient describes the pain as a stabbing shooting pain. Some primary care provider. Stated and there was severity of pain a 7 out of 10 and now seems to be about the same. Seems to be worse with activity such as standing for long amount time. Patient has had also swelling that is associated with it as well as a popping sound.     Past Medical History:  Diagnosis Date  . Hypertension    No past surgical history on file. Social History   Social History  . Marital status: Single    Spouse name: N/A  . Number of children: N/A  . Years of education: N/A   Social History Main Topics  . Smoking status: Never Smoker  . Smokeless tobacco: Never Used  . Alcohol use Yes  . Drug use: No  . Sexual activity: Yes   Other Topics Concern  . None   Social History Narrative  . None   No Known Allergies Family History  Problem Relation Age of Onset  . Heart disease Father 73       MI  . Hypertension Father   . Sudden death Father        Heart disease  . Diabetes Mother        Borderline  . Hyperlipidemia Mother   . Hypertension Mother   . Breast cancer Maternal Grandmother     Past medical history, social, surgical and family history all reviewed in electronic medical record.  No pertanent information unless stated regarding to the chief complaint.   Review of Systems:Review of systems updated and as accurate as of 08/11/16  No headache, visual changes, nausea, vomiting, diarrhea, constipation, dizziness, abdominal pain, skin rash, fevers, chills, night sweats, weight loss, swollen lymph nodes,  body aches, joint swelling, muscle aches, chest pain, shortness of breath, mood changes.   Objective  Blood pressure (!) 154/100, pulse 99, height 6\' 1"  (1.854 m), weight (!) 417 lb (189.1 kg), SpO2 97 %. Systems examined below as of 08/11/16   General: No apparent distress alert and oriented x3 mood and affect normal, dressed appropriately. Morbidly obese HEENT: Pupils equal, extraocular movements intact  Respiratory: Patient's speak in full sentences and does not appear short of breath  Cardiovascular: No lower extremity edema, non tender, no erythema  Skin: Warm dry intact with no signs of infection or rash on extremities or on axial skeleton.  Abdomen: Soft nontender  Neuro: Cranial nerves II through XII are intact, neurovascularly intact in all extremities with 2+ DTRs and 2+ pulses.  Lymph: No lymphadenopathy of posterior or anterior cervical chain or axillae bilaterally.  Gait mild antalgic gait MSK:  Non tender with full range of motion and good stability and symmetric strength and tone of shoulders, elbows, wrist, hip, and ankles bilaterally.  Knee: Right knee Normal to inspection with no erythema or effusion or obvious bony abnormalities. Large thigh to calf ratio Palpation normal with no warmth, joint line tenderness, patellar tenderness, or condyle tenderness. ROM full in flexion and extension and lower leg rotation. Ligaments with solid consistent endpoints  including ACL, PCL, LCL, MCL. Negative Mcmurray's, Apley's, and Thessalonian tests. Severe painful patellar compression. Patellar glide moderate to severe crepitus. Patellar and quadriceps tendons unremarkable. Hamstring and quadriceps strength is normal.   MSK US performed of: Right knee This study was ordered, performed, and interpreted by Terrilee FilesZach Smith D.O.  Knee: Severe narrowing of the patellofemoral joint. Hypoechoic changes underneath as well. No meniscal injury. Mild narrowing of the medial joint  space  IMPRESSION:  Patellofemoral arthritis with chronic subluxation of the kneecap    Impression and Recommendations:     This case required medical decision making of moderate complexity.      Note: This dictation was prepared with Dragon dictation along with smaller phrase technology. Any transcriptional errors that result from this process are unintentional.

## 2016-08-11 NOTE — Assessment & Plan Note (Signed)
Patient is having more of a patellofemoral arthritis with chronic subluxation. We discussed with patient at great length. We discussed icing regimen, home exercises, which activities doing which ones to avoid. Patient will start to increase activity. Given a brace. Topical anti-inflammatory's prescribed. Encourage weight loss. Follow-up again in 4 weeks. Worsening symptoms consider injection as well as formal physical therapy. Due to patient's body habitus, large thigh to calf ratio patient may need custom brace as well.

## 2016-08-11 NOTE — Patient Instructions (Addendum)
Good to see you.  Ice 20 minutes 2 times daily. Usually after activity and before bed. Exercises 3 times a week.  pennsaid pinkie amount topically 2 times daily as needed.  Wear brace daily for 10 days and then try to wear it less and less except always wear it with exercise.  Over the counter try vitamin D 2000 IU daily  Wear good supportive shoes.  See me again in 4 weeks and if not better we will discuss injection and PT Patella subluxation and patellofemoral arthritis

## 2016-08-11 NOTE — Assessment & Plan Note (Signed)
Encourage weight loss. Patient is on weight loss medications.

## 2016-08-31 ENCOUNTER — Encounter (INDEPENDENT_AMBULATORY_CARE_PROVIDER_SITE_OTHER): Payer: Self-pay | Admitting: Family Medicine

## 2016-09-05 ENCOUNTER — Ambulatory Visit (INDEPENDENT_AMBULATORY_CARE_PROVIDER_SITE_OTHER): Payer: PRIVATE HEALTH INSURANCE | Admitting: Family Medicine

## 2016-09-05 ENCOUNTER — Encounter (INDEPENDENT_AMBULATORY_CARE_PROVIDER_SITE_OTHER): Payer: Self-pay | Admitting: Family Medicine

## 2016-09-05 VITALS — BP 144/87 | HR 71 | Temp 97.6°F | Ht 73.0 in | Wt >= 6400 oz

## 2016-09-05 DIAGNOSIS — R0683 Snoring: Secondary | ICD-10-CM | POA: Insufficient documentation

## 2016-09-05 DIAGNOSIS — E669 Obesity, unspecified: Secondary | ICD-10-CM

## 2016-09-05 DIAGNOSIS — Z6841 Body Mass Index (BMI) 40.0 and over, adult: Secondary | ICD-10-CM | POA: Diagnosis not present

## 2016-09-05 DIAGNOSIS — R5383 Other fatigue: Secondary | ICD-10-CM | POA: Insufficient documentation

## 2016-09-05 DIAGNOSIS — Z0289 Encounter for other administrative examinations: Secondary | ICD-10-CM

## 2016-09-05 DIAGNOSIS — E785 Hyperlipidemia, unspecified: Secondary | ICD-10-CM

## 2016-09-05 DIAGNOSIS — Z1389 Encounter for screening for other disorder: Secondary | ICD-10-CM | POA: Diagnosis not present

## 2016-09-05 DIAGNOSIS — Z1331 Encounter for screening for depression: Secondary | ICD-10-CM

## 2016-09-05 DIAGNOSIS — IMO0001 Reserved for inherently not codable concepts without codable children: Secondary | ICD-10-CM

## 2016-09-05 DIAGNOSIS — I10 Essential (primary) hypertension: Secondary | ICD-10-CM | POA: Diagnosis not present

## 2016-09-05 DIAGNOSIS — R0609 Other forms of dyspnea: Secondary | ICD-10-CM | POA: Diagnosis not present

## 2016-09-05 NOTE — Progress Notes (Signed)
Office: [phone]  /  Fax: [phone]   Dear Dr. Zola Button,   Thank you for referring Sean [name] to our clinic. The following note includes my evaluation and treatment recommendations.  HPI:   Chief Complaint: OBESITY    Sean [name] has been referred by Donato Schultz, DO for consultation regarding his obesity and obesity related comorbidities.    Sean [name] (MR# [id number]) is a 41 y.o. male who presents on 09/05/2016 for obesity evaluation and treatment. Current BMI is Body mass index is 54.88 kg/m.Sean [name] has been struggling with his weight for many years and has been unsuccessful in either losing weight, maintaining weight loss, or reaching his healthy weight goal.   Sean [name] lost approximately 100 pounds about 10 years ago with an Atkins/low carbohydrate/exercise plan but he has gained it all back and is now at his heaviest.     Sean [name] attended our information session and states he is currently in the action stage of change and ready to dedicate time achieving and maintaining a healthier weight. Sean [name] is interested in becoming our patient and working on intensive lifestyle modifications including (but not limited to) diet, exercise and weight loss.    Sean [name] states his family eats meals together he thinks his family will eat healthier with  him his desired weight loss is 152 lbs he has been heavy most of  his life he started gaining weight in his teens his heaviest weight ever was 416 lbs. he is a picky eater and doesn't like to eat healthier foods  he has significant food cravings issues  he skips meals frequently he is frequently drinking liquids with calories he frequently makes poor food choices he frequently eats larger portions than normal  he has binge eating behaviors he struggles with emotional eating    Sean [name] feels his energy is lower than it should be. This has worsened with weight gain and has not worsened recently. Sean [name] admits to daytime  somnolence and  admits to waking up still tired. Patient is at risk for obstructive sleep apnea. Patent has a history of symptoms of daytime Sean and morning Sean. Patient generally gets 8 hours of sleep per night, and states they generally have restless sleep. Snoring is present. Apneic episodes are present. Epworth Sleepiness Score is 5  Dyspnea on exertion [name] notes increasing shortness of breath with exercising and seems to be worsening over time with weight gain. He notes getting out of breath sooner with activity than he used to. This has not gotten worse recently. [name] denies orthopnea.  Hypertension Sean [name] is a 41 y.o. male with hypertension. His blood pressure is elevated today and he is currently on Norvasc, Losartan and HCTZ. He suspects he has sleep apnea but has not been tested. Sean [name] denies chest pain. He is attempting weight loss to help control his blood pressure with the goal of decreasing his risk of heart attack and stroke. Sean [name] blood pressure is not currently controlled.  Hyperlipidemia Sean [name] has hyperlipidemia and is currently on a statin. He would like to control his cholesterol levels with intensive lifestyle modification including a low saturated fat diet, exercise and weight loss. He denies any chest pain, claudication or myalgias. Sean [name] has a strong family history of coronary artery disease.  Snoring Sean [name] has a diagnosis of snoring with daytime somnolence. He has witnessed apneic episodes and hypertension. Sean [name] wakes up unrefreshed but his Epworth scale was only 5. He sometimes wakes up gasping for  air.  Depression Screen Sean [name]'s Food and Mood (modified PHQ-9) score was  Depression screen PHQ 2/9 09/05/2016  Decreased Interest 3  Down, Depressed, Hopeless 2  PHQ - 2 Score 5  Altered sleeping 3  Tired, decreased energy 3  Change in appetite 3  Feeling bad or failure about yourself  2  Moving slowly or fidgety/restless 0  Suicidal thoughts 0    PHQ-9 Score 16    ALLERGIES: No Known Allergies  MEDICATIONS: Current Outpatient Prescriptions on File Prior to Visit  Medication Sig Dispense Refill  . amLODipine (NORVASC) 10 MG tablet Take 1 tablet (10 mg total) by mouth daily. 90 tablet 3  . atorvastatin (LIPITOR) 20 MG tablet TAKE ONE TABLET BY MOUTH ONCE DAILY 90 tablet 0  . celecoxib (CELEBREX) 200 MG capsule Take 1 capsule (200 mg total) by mouth 2 (two) times daily. 30 capsule 5  . ibuprofen (ADVIL,MOTRIN) 800 MG tablet Take 1 tablet (800 mg total) by mouth 3 (three) times daily. 21 tablet 0  . losartan-hydrochlorothiazide (HYZAAR) 100-25 MG tablet Take 1 tablet by mouth daily. 90 tablet 3   No current facility-administered medications on file prior to visit.     PAST MEDICAL HISTORY: Past Medical History:  Diagnosis Date  . Heartburn   . Hyperlipidemia   . Hypertension   . Sleep apnea   . SOB (shortness of breath) on exertion   . Swelling    feet and legs    PAST SURGICAL HISTORY: History reviewed. No pertinent surgical history.  SOCIAL HISTORY: Social History  Substance Use Topics  . Smoking status: Never Smoker  . Smokeless tobacco: Never Used  . Alcohol use Yes    FAMILY HISTORY: Family History  Problem Relation Age of Onset  . Heart disease Father 2856       MI  . Hypertension Father   . Sudden death Father        Heart disease  . Hyperlipidemia Father   . Obesity Father   . Diabetes Mother        Borderline  . Hyperlipidemia Mother   . Hypertension Mother   . Depression Mother   . Obesity Mother   . Breast cancer Maternal Grandmother     ROS: Review of Systems  Constitutional: Positive for malaise/Sean.  Eyes: Positive for redness.       Wear Glasses or Contacts  Respiratory: Positive for shortness of breath (on exertion).        Painful or Difficulty Breathing Difficulty Breathing while Lying Down Sudden Awakening from sleep with [name]  Cardiovascular: Negative for  chest pain, orthopnea and claudication.       Leg Cramping  Gastrointestinal: Positive for heartburn.  Musculoskeletal: Positive for back pain. Negative for myalgias.       Muscle or Joint Pain  Psychiatric/Behavioral: The patient has insomnia.        Stress    PHYSICAL EXAM: Blood pressure (!) 144/87, pulse 71, temperature 97.6 F (36.4 C), temperature source Oral, height 6\' 1"  (1.854 m), weight (!) 416 lb (188.7 kg), SpO2 98 %. Body mass index is 54.88 kg/m. Physical Exam  Constitutional: He is oriented to person, place, and time. He appears well-developed and well-nourished.  Cardiovascular: Normal rate.   Pulmonary/Chest: Effort normal.  Musculoskeletal: Normal range of motion.  Neurological: He is oriented to person, place, and time.  Skin: Skin is warm and dry.  Psychiatric: He has a normal mood and affect. His behavior is normal.  Vitals  reviewed.   RECENT LABS AND TESTS: BMET    Component Value Date/Time   NA 140 03/20/2016 1708   K 3.8 03/20/2016 1708   CL 102 03/20/2016 1708   CO2 31 03/20/2016 1708   GLUCOSE 92 03/20/2016 1708   BUN 14 03/20/2016 1708   CREATININE 0.98 03/20/2016 1708   CALCIUM 9.6 03/20/2016 1708   GFRNONAA >60 10/20/2014 0700   GFRAA >60 10/20/2014 0700   No results found for: HGBA1C No results found for: INSULIN CBC    Component Value Date/Time   WBC 11.3 (H) 04/22/2015 1615   RBC 5.49 04/22/2015 1615   HGB 15.8 04/22/2015 1615   HCT 46.2 04/22/2015 1615   PLT 292.0 04/22/2015 1615   MCV 84.1 04/22/2015 1615   MCH 28.1 10/20/2014 0700   MCHC 34.1 04/22/2015 1615   RDW 13.6 04/22/2015 1615   LYMPHSABS 2.8 04/22/2015 1615   MONOABS 1.0 04/22/2015 1615   EOSABS 0.5 04/22/2015 1615   BASOSABS 0.1 04/22/2015 1615   Iron/TIBC/Ferritin/ %Sat No results found for: IRON, TIBC, FERRITIN, IRONPCTSAT Lipid Panel     Component Value Date/Time   CHOL 171 03/20/2016 1708   TRIG 195.0 (H) 03/20/2016 1708   HDL 43.60 03/20/2016 1708    CHOLHDL 4 03/20/2016 1708   VLDL 39.0 03/20/2016 1708   LDLCALC 88 03/20/2016 1708   LDLDIRECT 189.0 04/22/2015 1615   Hepatic Function Panel     Component Value Date/Time   PROT 7.2 03/20/2016 1708   ALBUMIN 4.2 03/20/2016 1708   AST 20 03/20/2016 1708   ALT 28 03/20/2016 1708   ALKPHOS 71 03/20/2016 1708   BILITOT 0.5 03/20/2016 1708   BILIDIR 0.1 02/19/2014 1048      Component Value Date/Time   TSH 0.44 10/27/2013 1103   TSH 1.17 11/22/2010 1447    ECG  shows NSR with a rate of 72 BPM INDIRECT CALORIMETER done today shows a VO2 of 368 and a REE of 2559.  His calculated basal metabolic rate is 1610 thus his basal metabolic rate is worse than expected.    ASSESSMENT AND PLAN: Other Sean - Plan: EKG 12-Lead, Comprehensive metabolic panel, CBC with Differential/Platelet, Hemoglobin A1c, Insulin, random, VITAMIN D 25 Hydroxy (Vit-D Deficiency, Fractures), Vitamin B12, Folate, TSH, T4, free, T3  Dyspnea on exertion  Essential hypertension - Plan: Comprehensive metabolic panel  Hyperlipidemia, unspecified hyperlipidemia type - Plan: Lipid Panel With LDL/HDL Ratio  Snoring  Depression screening  Class 3 obesity with serious comorbidity and body mass index (BMI) of 50.0 to 59.9 in adult, unspecified obesity type (HCC)  PLAN: Sean Ferlando was informed that his Sean may be related to obesity, depression or many other causes. Labs will be ordered, and in the meanwhile Wilbern has agreed to work on diet, exercise and weight loss to help with Sean. Proper sleep hygiene was discussed including the need for 7-8 hours of quality sleep each night. A sleep study was not ordered based on symptoms and Epworth score.  Dyspnea on exertion Kenan's shortness of breath appears to be obesity related and exercise induced. He has agreed to work on weight loss and gradually increase exercise to treat his exercise induced shortness of breath. If Levar follows our instructions and loses weight  without improvement of his shortness of breath, we will plan to refer to pulmonology. We will monitor this condition regularly. Bianca agrees to this plan.  Hypertension We discussed sodium restriction, working on healthy weight loss, and a regular exercise program as the  means to achieve improved blood pressure control. Sean PertEvan agreed with this plan and agreed to follow up as directed. We will check labs and continue to monitor his blood pressure as well as his progress with the above lifestyle modifications. He will continue his medications as prescribed and will watch for signs of hypotension as he continues his lifestyle modifications.  Hyperlipidemia Sean Pertvan was informed of the American Heart Association Guidelines emphasizing intensive lifestyle modifications as the first line treatment for hyperlipidemia. We discussed many lifestyle modifications today in depth, and Sean Pertvan will continue to work on decreasing saturated fats such as fatty red meat, butter and many fried foods. He will also increase vegetables and lean protein in his diet and continue to work on exercise and weight loss efforts. We will check labs and Sean Pertvan agrees to follow up as directed.  Snoring We will refer Gannon to a sleep doctor for evaluation and he agrees to follow up with our clinic in 2 weeks.  Depression Screen Sean Pertvan had a strongly positive depression screening. Depression is commonly associated with obesity and often results in emotional eating behaviors. We will monitor this closely and work on CBT to help improve the non-hunger eating patterns. Referral to Psychology may be required if no improvement is seen as he continues in our clinic.  Obesity Sean Pertvan is currently in the action stage of change and his goal is to continue with weight loss efforts. I recommend Sean Pertvan begin the structured treatment plan as follows:  He has agreed to follow the Category 3 plan +300 calories Sean Pertvan has been instructed to eventually work up to a goal  of 150 minutes of combined cardio and strengthening exercise per week for weight loss and overall health benefits. We discussed the following Behavioral Modification Strategies today: increasing lean protein intake and meal planning & cooking strategies   He was informed of the importance of frequent follow up visits to maximize his success with intensive lifestyle modifications for his multiple health conditions. He was informed we would discuss his lab results at his next visit unless there is a critical issue that needs to be addressed sooner. Yovanny agreed to keep his next visit at the agreed upon time to discuss these results.  I, [place] CraneJoanne Murray, am acting as transcriptionist for Quillian Quincearen Beasley, MD  I have reviewed the above documentation for accuracy and completeness, and I agree with the above. -Quillian Quincearen Beasley, MD  OBESITY BEHAVIORAL INTERVENTION VISIT  Today's visit was # 1 out of 22.  Starting weight: 416 lbs Starting date: 09/05/16 Today's weight : 416 lbs  Today's date: 09/05/2016 Total lbs lost to date: 0 (Patients must lose 7 lbs in the first 6 months to continue with counseling)   ASK: We discussed the diagnosis of obesity with Sean GraffEvan D Nakata today and Labrandon agreed to give us permission to discuss obesity behavioral modification therapy today.  ASSESS: Sean Pertvan has the diagnosis of obesity and his BMI today is 155 Kasheem is in the action stage of change   ADVISE: Sean Pertvan was educated on the multiple health risks of obesity as well as the benefit of weight loss to improve his health. He was advised of the need for long term treatment and the importance of lifestyle modifications.  AGREE: Multiple dietary modification options and treatment options were discussed and  Trai agreed to follow the Category 3 plan +300 calories We discussed the following Behavioral Modification Strategies today: meal planning & cooking strategies, increasing lean protein intake and meal planning & cooking  strategies

## 2016-09-06 LAB — CBC WITH DIFFERENTIAL/PLATELET
BASOS ABS: 0.1 10*3/uL (ref 0.0–0.2)
Basos: 1 %
EOS (ABSOLUTE): 0.5 10*3/uL — AB (ref 0.0–0.4)
Eos: 5 %
Hematocrit: 44.5 % (ref 37.5–51.0)
Hemoglobin: 15.2 g/dL (ref 13.0–17.7)
IMMATURE GRANS (ABS): 0.1 10*3/uL (ref 0.0–0.1)
IMMATURE GRANULOCYTES: 1 %
Lymphocytes Absolute: 2.8 10*3/uL (ref 0.7–3.1)
Lymphs: 30 %
MCH: 28.4 pg (ref 26.6–33.0)
MCHC: 34.2 g/dL (ref 31.5–35.7)
MCV: 83 fL (ref 79–97)
MONOCYTES: 8 %
MONOS ABS: 0.8 10*3/uL (ref 0.1–0.9)
Neutrophils Absolute: 5.2 10*3/uL (ref 1.4–7.0)
Neutrophils: 55 %
PLATELETS: 249 10*3/uL (ref 150–379)
RBC: 5.35 x10E6/uL (ref 4.14–5.80)
RDW: 14.6 % (ref 12.3–15.4)
WBC: 9.4 10*3/uL (ref 3.4–10.8)

## 2016-09-06 LAB — HEMOGLOBIN A1C
ESTIMATED AVERAGE GLUCOSE: 117 mg/dL
HEMOGLOBIN A1C: 5.7 % — AB (ref 4.8–5.6)

## 2016-09-06 LAB — COMPREHENSIVE METABOLIC PANEL
A/G RATIO: 1.8 (ref 1.2–2.2)
ALBUMIN: 4.3 g/dL (ref 3.5–5.5)
ALT: 27 IU/L (ref 0–44)
AST: 25 IU/L (ref 0–40)
Alkaline Phosphatase: 66 IU/L (ref 39–117)
BILIRUBIN TOTAL: 0.6 mg/dL (ref 0.0–1.2)
BUN / CREAT RATIO: 18 (ref 9–20)
BUN: 13 mg/dL (ref 6–24)
CHLORIDE: 99 mmol/L (ref 96–106)
CO2: 27 mmol/L (ref 20–29)
Calcium: 9.2 mg/dL (ref 8.7–10.2)
Creatinine, Ser: 0.73 mg/dL — ABNORMAL LOW (ref 0.76–1.27)
GFR calc non Af Amer: 115 mL/min/{1.73_m2} (ref >59)
GFR, EST AFRICAN AMERICAN: 133 mL/min/{1.73_m2} (ref >59)
Globulin, Total: 2.4 g/dL (ref 1.5–4.5)
Glucose: 85 mg/dL (ref 65–99)
POTASSIUM: 3.8 mmol/L (ref 3.5–5.2)
Sodium: 140 mmol/L (ref 134–144)
TOTAL PROTEIN: 6.7 g/dL (ref 6.0–8.5)

## 2016-09-06 LAB — TSH: TSH: 2.21 u[IU]/mL (ref 0.450–4.500)

## 2016-09-06 LAB — LIPID PANEL WITH LDL/HDL RATIO
CHOLESTEROL TOTAL: 167 mg/dL (ref 100–199)
HDL: 39 mg/dL — ABNORMAL LOW (ref >39)
LDL Calculated: 110 mg/dL — ABNORMAL HIGH (ref 0–99)
LDl/HDL Ratio: 2.8 ratio (ref 0.0–3.6)
Triglycerides: 92 mg/dL (ref 0–149)
VLDL Cholesterol Cal: 18 mg/dL (ref 5–40)

## 2016-09-06 LAB — VITAMIN B12: VITAMIN B 12: 386 pg/mL (ref 232–1245)

## 2016-09-06 LAB — FOLATE: FOLATE: 10.9 ng/mL (ref >3.0)

## 2016-09-06 LAB — T3: T3 TOTAL: 133 ng/dL (ref 71–180)

## 2016-09-06 LAB — T4, FREE: FREE T4: 1.29 ng/dL (ref 0.82–1.77)

## 2016-09-06 LAB — INSULIN, RANDOM: INSULIN: 29.3 u[IU]/mL — AB (ref 2.6–24.9)

## 2016-09-06 LAB — VITAMIN D 25 HYDROXY (VIT D DEFICIENCY, FRACTURES): VIT D 25 HYDROXY: 36.6 ng/mL (ref 30.0–100.0)

## 2016-09-07 ENCOUNTER — Other Ambulatory Visit (INDEPENDENT_AMBULATORY_CARE_PROVIDER_SITE_OTHER): Payer: Self-pay

## 2016-09-08 ENCOUNTER — Ambulatory Visit (INDEPENDENT_AMBULATORY_CARE_PROVIDER_SITE_OTHER): Payer: PRIVATE HEALTH INSURANCE | Admitting: Family Medicine

## 2016-09-08 ENCOUNTER — Ambulatory Visit (HOSPITAL_BASED_OUTPATIENT_CLINIC_OR_DEPARTMENT_OTHER)
Admission: RE | Admit: 2016-09-08 | Discharge: 2016-09-08 | Disposition: A | Discharge status: Home / Self Care | Payer: PRIVATE HEALTH INSURANCE | Source: Ambulatory Visit | Attending: Family Medicine | Admitting: Family Medicine

## 2016-09-08 ENCOUNTER — Encounter: Payer: Self-pay | Admitting: Family Medicine

## 2016-09-08 VITALS — BP 130/84 | HR 81 | Ht 73.0 in | Wt >= 6400 oz

## 2016-09-08 DIAGNOSIS — M1711 Unilateral primary osteoarthritis, right knee: Secondary | ICD-10-CM | POA: Diagnosis present

## 2016-09-08 NOTE — Progress Notes (Signed)
Sean [name]  D.O. [facility] [address] [place], [place] [address] Phone: [phone] Subjective:    CC: Right knee pain follow-up  BJY:[id number]:Subjective  Sean [name] is a 41 y.o. male coming in with complaint of  Right knee pain. Was seen before and did have patellofemoral arthritis with chronic subluxation. Patient has not been doing the exercises comes he is been working somewhat. Not wearing the brace on a regular basis. Very minimal benefit. Still having pain. Still mild instability.      Past Medical History:  Diagnosis Date  . Heartburn   . Hyperlipidemia   . Hypertension   . Sleep apnea   . SOB (shortness of breath) on exertion   . Swelling    feet and legs   No past surgical history on file. Social History   Social History  . Marital status: Single    Spouse name: N/A  . Number of children: N/A  . Years of education: N/A   Occupational History  . DietitianDining Services Manager    Social History Main Topics  . Smoking status: Never Smoker  . Smokeless tobacco: Never Used  . Alcohol use Yes  . Drug use: No  . Sexual activity: Yes   Other Topics Concern  . Not on file   Social History Narrative  . No narrative on file   No Known Allergies Family History  Problem Relation Age of Onset  . Heart disease Father [id number]       MI  . Hypertension Father   . Sudden death Father        Heart disease  . Hyperlipidemia Father   . Obesity Father   . Diabetes Mother        Borderline  . Hyperlipidemia Mother   . Hypertension Mother   . Depression Mother   . Obesity Mother   . Breast cancer Maternal Grandmother      Past medical history, social, surgical and family history all reviewed in electronic medical record.  No pertanent information unless stated regarding to the chief complaint.   Review of Systems:Review of systems updated and as accurate as of 09/08/16  No headache, visual changes, nausea, vomiting, diarrhea, constipation, dizziness, abdominal  pain, skin rash, fevers, chills, night sweats, weight loss, swollen lymph nodes, body aches, joint swelling,  chest pain, shortness of breath, mood changes.  Positive muscle aches Objective  Weight (!) 414 lb (187.8 kg). Systems examined below as of 09/08/16   General: No apparent distress alert and oriented x3 mood and affect normal, dressed appropriately.  HEENT: Pupils equal, extraocular movements intact  Respiratory: Patient's speak in full sentences and does not appear short of breath  Cardiovascular: No lower extremity edema, non tender, no erythema  Skin: Warm dry intact with no signs of infection or rash on extremities or on axial skeleton.  Abdomen: Soft nontender  Neuro: Cranial nerves II through XII are intact, neurovascularly intact in all extremities with 2+ DTRs and 2+ pulses.  Lymph: No lymphadenopathy of posterior or anterior cervical chain or axillae bilaterally.  Gait Antalgic gait  MSK:  Non tender with full range of motion and good stability and symmetric strength and tone of shoulders, elbows, wrist, hip, and ankles bilaterally.  Knee: Right Chronic subluxation Patient does have pain over the patellofemoral as well as the lateral joint line ROM full in flexion and extension and lower leg rotation. Ligaments with solid consistent endpoints including ACL, PCL, LCL, MCL. Negative Mcmurray's, Apley's, and Thessalonian  tests. Severe painful patellar compression. Patellar glide severe crepitus. Patellar and quadriceps tendons unremarkable. Hamstring and quadriceps strength is normal.  Contralateral knee unremarkable  After informed written and verbal consent, patient was seated on exam table. Right knee was prepped with alcohol swab and utilizing anterolateral approach, patient's right knee space was injected with 4:1  marcaine 0.5%: Kenalog 40mg /dL. Patient tolerated the procedure well without immediate complications.   Impression and Recommendations:     This case  required medical decision making of moderate complexity.      Note: This dictation was prepared with Dragon dictation along with smaller phrase technology. Any transcriptional errors that result from this process are unintentional.

## 2016-09-08 NOTE — Patient Instructions (Signed)
Good to see you  PT will be calling you  Ice is your friend Take it easy today then restart the exercises Xray downstairs today  See me again in 4 weeks.

## 2016-09-08 NOTE — Assessment & Plan Note (Signed)
Patient given injection today and tolerated the procedure well. We discussed icing regimen and home exercises. We discussed which activities to do which ones to avoid. Patient start increasing activity as tolerated. Will be sent to formal physical therapy. X-rays given for further evaluation. Depending on findings we'll discuss surgical management, viscous supplementation, or even custom bracing. Follow-up in 4 weeks

## 2016-09-12 ENCOUNTER — Ambulatory Visit: Payer: PRIVATE HEALTH INSURANCE | Attending: Family Medicine | Admitting: Physical Therapy

## 2016-09-12 DIAGNOSIS — R2689 Other abnormalities of gait and mobility: Secondary | ICD-10-CM | POA: Diagnosis present

## 2016-09-12 DIAGNOSIS — M25561 Pain in right knee: Secondary | ICD-10-CM | POA: Insufficient documentation

## 2016-09-12 DIAGNOSIS — R262 Difficulty in walking, not elsewhere classified: Secondary | ICD-10-CM | POA: Diagnosis present

## 2016-09-12 NOTE — Patient Instructions (Signed)
Hamstring Step 2   Left foot relaxed, knee straight, other leg bent, foot flat. Raise straight leg further upward to maximal range. Hold __30_ seconds. Relax leg completely down. Repeat _3__ times.  Strengthening: Straight Leg Raise (Phase 1)   Tighten muscles on front of right thigh, then lift leg __6-8__ inches from surface, keeping knee locked.  Repeat __15__ times per set. Do __2__ sets per session.   Bridging   Slowly raise buttocks from floor, keeping stomach tight. Repeat _15___ times per set. Do __2__ sets per session.   Long [employer] Quad   Straighten operated leg and try to hold it __5__ seconds.  Repeat __15__ times. Do __2__ sessions a day.

## 2016-09-12 NOTE — Therapy (Signed)
[facility] [place] [address]  [address] [place], [place], [address] Phone: [phone]   Fax:  [phone]  Physical Therapy Evaluation  Patient Details  Name: Sean [name] MRN: [id number] Date of Birth: 1975-03-14 Referring Provider: Dr. Antoine PrimasZachary Smith  Encounter Date: 09/12/2016      PT End of Session - 09/12/16 1352    Visit Number 1   Number of Visits 12   Date for PT Re-Evaluation 10/24/16   PT Start Time 1315   PT Stop Time 1356   PT Time Calculation (min) 41 min   Activity Tolerance Patient tolerated treatment well   Behavior During Therapy [facility] for tasks assessed/performed      Past Medical History:  Diagnosis Date  . Heartburn   . Hyperlipidemia   . Hypertension   . Sleep apnea   . SOB (shortness of breath) on exertion   . Swelling    feet and legs    No past surgical history on file.  There were no vitals filed for this visit.       Subjective Assessment - 09/12/16 1317    Subjective Patient reporting R knee pain - saw Dr. Laury AxonLowne, referred to Dr. Katrinka BlazingSmith. Reports his knee is "shifting" - was given a brace, only wore for approx 3-4 days with discomfort. Recently seen for follow-up - was given injection. Has been trying to do a weight loss program. Reports "crunching" of R knee joint.    Pertinent History HTN   Limitations Standing;Walking   Diagnostic tests Xray: Age advanced patellofemoral right knee joint degeneration   Patient Stated Goals return to leisure activities/work without pain   Currently in Pain? Yes   Pain Score 5    Pain Location Knee   Pain Orientation Right   Pain Descriptors / Indicators Aching;Sore   Pain Type Chronic pain   Pain Onset More than a month ago   Pain Frequency Intermittent   Aggravating Factors  standing, walking   Pain Relieving Factors ibuprofen            [facility] PT Assessment - 09/12/16 1325      Assessment   Medical Diagnosis Patellofemoral arthritis of R  knee   Referring Provider Dr. Antoine PrimasZachary Smith   Onset Date/Surgical Date --  ~6 month   Next MD Visit 10/06/16   Prior Therapy no     Precautions   Precautions None     Restrictions   Weight Bearing Restrictions No     Balance Screen   Has the patient fallen in the past 6 months No   Has the patient had a decrease in activity level because of a fear of falling?  No   Is the patient reluctant to leave their home because of a fear of falling?  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     Cognition   Overall Cognitive Status Within Functional Limits for tasks assessed     Observation/Other Assessments   Focus on Therapeutic Outcomes (FOTO)  Knee: 33 (67% limited, predicted 42% limited)     Sensation   Light Touch Appears Intact     Coordination   Gross Motor Movements are Fluid and Coordinated Yes     ROM /  Strength   AROM / PROM / Strength AROM;Strength     AROM   AROM Assessment Site Knee   Right/Left Knee Right;Left   Right Knee Extension -1   Right Knee Flexion 115   Left Knee Extension -2   Left Knee Flexion 122     Strength   Strength Assessment Site Hip;Knee   Right/Left Hip Right;Left   Right Hip Flexion 4/5   Left Hip Flexion 4/5   Right/Left Knee Right;Left   Right Knee Flexion 4/5   Right Knee Extension 4/5   Left Knee Flexion 4/5   Left Knee Extension 4/5     Palpation   Patella mobility R patella reduced mobility all directions   Palpation comment non-tender to joint line; some pain with patella mobility     Special Tests    Special Tests Knee Special Tests   Knee Special tests  Patellofemoral Grind Test (Clarke's Sign)     Patellofemoral Grind test (Clark's Sign)   Findings --  pain, no grinding   Side  Right             Objective measurements completed on examination: See above findings.          [facility] Adult PT Treatment/Exercise - 09/12/16 0001      Exercises   Exercises Knee/Hip     Knee/Hip Exercises: Stretches   Passive Hamstring Stretch Both;3 reps;30 seconds   Passive Hamstring Stretch Limitations supine with strap     Knee/Hip Exercises: Seated   Long Arc Quad Both;10 reps   Long Arc Quad Limitations with ball squeeze     Knee/Hip Exercises: Supine   Bridges Both;10 reps   Straight Leg Raises Both;10 reps                PT Education - 09/12/16 1352    Education provided Yes   Education Details exam findings, POC, HEP   Person(s) Educated Patient   Methods Explanation;Demonstration;Handout   Comprehension Verbalized understanding;Returned demonstration;Need further instruction          PT Short Term Goals - 09/12/16 1353      PT SHORT TERM GOAL #1   Title patient to be independent with initial HEP    Status New   Target Date 10/03/16           PT Long Term Goals - 09/12/16 1353      PT LONG TERM GOAL #1   Title patient to be independent with advanced HEP    Status New   Target Date 10/24/16     PT LONG TERM GOAL #2   Title patient to report ability to stand/walk for 3 hours for job related duties without knee pain limiting   Status New   Target Date 10/24/16     PT LONG TERM GOAL #3   Title patient to improve R LE strength to >/= 4+/5   Status New   Target Date 10/24/16     PT LONG TERM GOAL #4   Title patient to report pai reduction by >/= 50% for greater than 2 weeks   Status New   Target Date 10/24/16                Plan - 09/12/16 1352    Clinical Impression Statement Sean [name] is a 41 y/o male presenting to [facility] today for initial evaluation regarding R knee pain that has been present for approx 6 months in duration. Patient has trialed knee brace as well as injection  to R knee with continued pain. Patient reporting work  duties involving standing for prolonged periods with knee pain limiting this. Patient today with near symmetrical AROM of B knee as well as slight reduction in LE strength, however, symmetrical. Patient to benefit from PT to address functional mobility limitations to allow for improved mobility and pain.    Clinical Presentation Stable   Clinical Decision Making Low   Rehab Potential Good   PT Frequency 2x / week   PT Duration 6 weeks   PT Treatment/Interventions ADLs/Self Care Home Management;Cryotherapy;Electrical Stimulation;Iontophoresis 4mg /ml Dexamethasone;Moist Heat;Ultrasound;Neuromuscular re-education;Balance training;Therapeutic exercise;Therapeutic activities;Functional mobility training;Patient/family education;Manual techniques;Passive range of motion;Vasopneumatic Device;Taping;Dry needling   Consulted and Agree with Plan of Care Patient      Patient will benefit from skilled therapeutic intervention in order to improve the following deficits and impairments:  Abnormal gait, Decreased activity tolerance, Decreased mobility, Decreased strength, Difficulty walking, Pain  Visit Diagnosis: Acute pain of right knee - Plan: PT plan of care cert/re-cert  Difficulty in walking, not elsewhere classified - Plan: PT plan of care cert/re-cert  Other abnormalities of gait and mobility - Plan: PT plan of care cert/re-cert     Problem List Patient Active Problem List   Diagnosis Date Noted  . Other fatigue 09/05/2016  . Dyspnea on exertion 09/05/2016  . Snoring 09/05/2016  . Class 3 obesity with serious comorbidity and body mass index (BMI) of 50.0 to 59.9 in adult (HCC) 09/05/2016  . Patellofemoral arthritis of right knee 08/11/2016  . Severe obesity (BMI >= 40) (HCC) 05/09/2015  . Hyperlipidemia 02/19/2014  . Sinusitis, acute maxillary 02/09/2014  . Infected laceration 02/09/2014  . HTN (hypertension) 03/13/2011     Kipp Laurence, PT, DPT 09/12/16 4:27 PM   [facility] [address]  [address] [place], [place], [address] Phone: [phone]   Fax:  [phone]  Name: Sean [name] MRN: [id number] Date of Birth: April 18, 1975

## 2016-09-13 ENCOUNTER — Other Ambulatory Visit (INDEPENDENT_AMBULATORY_CARE_PROVIDER_SITE_OTHER): Payer: Self-pay

## 2016-09-14 ENCOUNTER — Telehealth: Payer: Self-pay | Admitting: Pulmonary Disease

## 2016-09-14 NOTE — Telephone Encounter (Signed)
CY  Please Advise-  Mandy called from Dr. Dalbert GarnetBeasley office because she states Dr. Dalbert GarnetBeasley would like patient to be seen sooner for his sleep consult. Pt is scheduled to see Vassie Lolllva, Nov. 1 which was our soonest appt. Per referring dx are: snoring,hypertension,obesity. Would you be able to see patient sooner?

## 2016-09-14 NOTE — Telephone Encounter (Signed)
Yes

## 2016-09-14 NOTE — Telephone Encounter (Signed)
Spoke with Sean MarinMandy-she is aware that we have made appt on August 22,2018 at 11:30am. Angelica ChessmanMandy will reach out to patient with appt date and time. If time and date does not work for Raytheonpatient-Mandy will email me. Nothing more needed at this time.

## 2016-09-19 ENCOUNTER — Ambulatory Visit: Payer: PRIVATE HEALTH INSURANCE | Admitting: Family Medicine

## 2016-09-19 ENCOUNTER — Ambulatory Visit (INDEPENDENT_AMBULATORY_CARE_PROVIDER_SITE_OTHER): Payer: PRIVATE HEALTH INSURANCE | Admitting: Family Medicine

## 2016-09-19 ENCOUNTER — Ambulatory Visit: Payer: PRIVATE HEALTH INSURANCE | Admitting: Physical Therapy

## 2016-09-19 VITALS — BP 133/81 | HR 64 | Temp 97.9°F | Ht 73.0 in | Wt >= 6400 oz

## 2016-09-19 DIAGNOSIS — IMO0001 Reserved for inherently not codable concepts without codable children: Secondary | ICD-10-CM

## 2016-09-19 DIAGNOSIS — M25561 Pain in right knee: Secondary | ICD-10-CM | POA: Diagnosis not present

## 2016-09-19 DIAGNOSIS — E785 Hyperlipidemia, unspecified: Secondary | ICD-10-CM

## 2016-09-19 DIAGNOSIS — Z9189 Other specified personal risk factors, not elsewhere classified: Secondary | ICD-10-CM

## 2016-09-19 DIAGNOSIS — E559 Vitamin D deficiency, unspecified: Secondary | ICD-10-CM

## 2016-09-19 DIAGNOSIS — E669 Obesity, unspecified: Secondary | ICD-10-CM

## 2016-09-19 DIAGNOSIS — R2689 Other abnormalities of gait and mobility: Secondary | ICD-10-CM

## 2016-09-19 DIAGNOSIS — R7303 Prediabetes: Secondary | ICD-10-CM | POA: Insufficient documentation

## 2016-09-19 DIAGNOSIS — R262 Difficulty in walking, not elsewhere classified: Secondary | ICD-10-CM

## 2016-09-19 DIAGNOSIS — Z6841 Body Mass Index (BMI) 40.0 and over, adult: Secondary | ICD-10-CM

## 2016-09-19 MED ORDER — METFORMIN HCL 500 MG PO TABS: 500.0000 mg | ORAL_TABLET | Freq: Every day | ORAL | 0 refills | Status: DC

## 2016-09-19 MED ORDER — VITAMIN D (ERGOCALCIFEROL) 1.25 MG (50000 UNIT) PO CAPS: 50000.0000 [IU] | ORAL_CAPSULE | ORAL | 0 refills | Status: DC

## 2016-09-19 NOTE — Therapy (Signed)
[facility] [address]  [address] [place], [place], [address] Phone: [phone]   Fax:  [phone]  Physical Therapy Treatment  Patient Details  Name: Sean [name] MRN: [id number] Date of Birth: Jan 03, 1976 Referring Provider: Dr. Antoine Primas  Encounter Date: 09/19/2016      PT End of Session - 09/19/16 1618    Visit Number 2   Number of Visits 12   Date for PT Re-Evaluation 10/24/16   PT Start Time 1616   PT Stop Time 1657   PT Time Calculation (min) 41 min   Activity Tolerance Patient tolerated treatment well   Behavior During Therapy [facility] for tasks assessed/performed      Past Medical History:  Diagnosis Date  . Heartburn   . Hyperlipidemia   . Hypertension   . Sleep apnea   . SOB (shortness of breath) on exertion   . Swelling    feet and legs    No past surgical history on file.  There were no vitals filed for this visit.      Subjective Assessment - 09/19/16 1617    Subjective has ben doing HEP some; has taken on more responsibility at work   Pertinent History HTN   Diagnostic tests Xray: Age advanced patellofemoral right knee joint degeneration   Patient Stated Goals return to leisure activities/work without pain   Currently in Pain? Yes   Pain Score 4    Pain Location Knee   Pain Orientation Right   Pain Descriptors / Indicators Aching;Sharp   Pain Type Chronic pain                         [facility] Adult PT Treatment/Exercise - 09/19/16 1621      Knee/Hip Exercises: Aerobic   Nustep L5 x 6 min     Knee/Hip Exercises: Machines for Strengthening   Cybex Knee Extension 25# B LE x 15; 15# B con/R ecc 2 x 15     Knee/Hip Exercises: Standing   Terminal Knee Extension Limitations R LE - 15 reps - blue tband   Step Down Right;15 reps;Hand Hold: 2;Step Height: 6"   Step Down Limitations eccentric   Functional Squat 15 reps   Functional Squat Limitations TRX     Knee/Hip  Exercises: Seated   Other Seated Knee/Hip Exercises fitter - 1 black/1 blue R LE only x 15     Knee/Hip Exercises: Supine   Bridges Both;15 reps   Straight Leg Raises Right;15 reps   Straight Leg Raises Limitations 2#   Straight Leg Raise with External Rotation Right;15 reps   Straight Leg Raise with External Rotation Limitations 2#     Modalities   Modalities Iontophoresis     Iontophoresis   Type of Iontophoresis Dexamethasone   Location R knee   Dose 1.0 mL   Time 4-6 hours; 80 mA     Manual Therapy   Manual Therapy Joint mobilization   Joint Mobilization patellar mobs - all directions; improved motion                  PT Short Term Goals - 09/12/16 1353      PT SHORT TERM GOAL #1   Title patient to be independent with initial HEP    Status New   Target Date 10/03/16           PT Long Term Goals - 09/12/16 1353  PT LONG TERM GOAL #1   Title patient to be independent with advanced HEP    Status New   Target Date 10/24/16     PT LONG TERM GOAL #2   Title patient to report ability to stand/walk for 3 hours for job related duties without knee pain limiting   Status New   Target Date 10/24/16     PT LONG TERM GOAL #3   Title patient to improve R LE strength to >/= 4+/5   Status New   Target Date 10/24/16     PT LONG TERM GOAL #4   Title patient to report pai reduction by >/= 50% for greater than 2 weeks   Status New   Target Date 10/24/16               Plan - 09/19/16 1658    Clinical Impression Statement Sean [name] doing well today with all strengthening progressions. Patient reporitng fair/good complaince with current HEP with some continued pain. Patient able to perform all strengthening based activities today with only pain ellicited during squats - resolved with reduction in range. Patient to continue to progress towards goals.    PT Treatment/Interventions ADLs/Self Care Home Management;Cryotherapy;Electrical Stimulation;Iontophoresis  4mg /ml Dexamethasone;Moist Heat;Ultrasound;Neuromuscular re-education;Balance training;Therapeutic exercise;Therapeutic activities;Functional mobility training;Patient/family education;Manual techniques;Passive range of motion;Vasopneumatic Device;Taping;Dry needling   Consulted and Agree with Plan of Care Patient      Patient will benefit from skilled therapeutic intervention in order to improve the following deficits and impairments:  Abnormal gait, Decreased activity tolerance, Decreased mobility, Decreased strength, Difficulty walking, Pain  Visit Diagnosis: Acute pain of right knee  Difficulty in walking, not elsewhere classified  Other abnormalities of gait and mobility     Problem List Patient Active Problem List   Diagnosis Date Noted  . Prediabetes 09/19/2016  . Vitamin D deficiency 09/19/2016  . Other fatigue 09/05/2016  . Dyspnea on exertion 09/05/2016  . Snoring 09/05/2016  . Class 3 obesity with serious comorbidity and body mass index (BMI) of 50.0 to 59.9 in adult (HCC) 09/05/2016  . Patellofemoral arthritis of right knee 08/11/2016  . Severe obesity (BMI >= 40) (HCC) 05/09/2015  . Hyperlipidemia 02/19/2014  . Sinusitis, acute maxillary 02/09/2014  . Infected laceration 02/09/2014  . HTN (hypertension) 03/13/2011     Kipp LaurenceStephanie R , PT, DPT 09/19/16 5:02 PM   [facility] [address]  [address] [place], [place], [address] Phone: [phone]   Fax:  [phone]  Name: Sean [name] MRN: [id number] Date of Birth: 05/22/1975

## 2016-09-19 NOTE — Progress Notes (Signed)
Office: [phone]  /  Fax: [phone]   HPI:   Chief Complaint: OBESITY Sean [name] is here to discuss his progress with his obesity treatment plan. He is on the  follow the Category 3 plan + 300 calories and is following his eating plan approximately 65 to 70 % of the time. He states he is exercising in rehab for knee 1 time per week. Sean [name] has done well with weight loss but had significant cravings especially at breakfast and often added simple carbohydrates like grits etc. Hunger is mostly controlled otherwise. 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  His weight is (!) 408 lb (185.1 kg) today and has had a weight loss of 8 pounds over a period of 2 weeks since his last visit. He has lost 8 lbs since starting treatment with [name].  Vitamin D deficiency Sean [name] has a new diagnosis of vitamin D deficiency. He is currently taking OTC vit D (unknown amount) but not yet at goal and he denies nausea, vomiting or muscle weakness.  Pre-Diabetes Sean [name] has a new diagnosis of pre-diabetes based on his elevated Hgb A1c at 5.7 and elevated fasting insulin and he has a positive family history of diabetes. He was informed this puts him at greater risk of developing diabetes. He is not taking metformin currently and continues to work on diet and exercise to decrease risk of diabetes. He admits polyphagia, especially in the morning and denies nausea or hypoglycemia.  At risk for diabetes Sean [name] is at higher than average risk for developing diabetes due to his obesity and pre-diabetes. He currently denies polyuria or polydipsia.  Hyperlipidemia Sean [name] has hyperlipidemia, LDL is elevated at 110 and HDL is low at 39 and he has been trying to improve his cholesterol levels with intensive lifestyle modification including a low saturated fat diet, exercise and weight loss. He denies any chest pain, claudication or myalgias. He is currently taking lipitor 20 mg qd.  ALLERGIES: No Known  Allergies  MEDICATIONS: Current Outpatient Prescriptions on File Prior to Visit  Medication Sig Dispense Refill  . amLODipine (NORVASC) 10 MG tablet Take 1 tablet (10 mg total) by mouth daily. 90 tablet 3  . atorvastatin (LIPITOR) 20 MG tablet TAKE ONE TABLET BY MOUTH ONCE DAILY 90 tablet 0  . calcium-vitamin D 250-100 MG-UNIT tablet Take 1 tablet by mouth 2 (two) times daily.    . celecoxib (CELEBREX) 200 MG capsule Take 1 capsule (200 mg total) by mouth 2 (two) times daily. 30 capsule 5  . Glucosamine-Chondroitin-MSM (TRIPLE FLEX PO) Take by mouth.    Marland Kitchen ibuprofen (ADVIL,MOTRIN) 800 MG tablet Take 1 tablet (800 mg total) by mouth 3 (three) times daily. 21 tablet 0  . losartan-hydrochlorothiazide (HYZAAR) 100-25 MG tablet Take 1 tablet by mouth daily. 90 tablet 3  . Turmeric 500 MG TABS Take by mouth.     No current facility-administered medications on file prior to visit.     PAST MEDICAL HISTORY: Past Medical History:  Diagnosis Date  . Heartburn   . Hyperlipidemia   . Hypertension   . Sleep apnea   . SOB (shortness of breath) on exertion   . Swelling    feet and legs    PAST SURGICAL HISTORY: No past surgical history on file.  SOCIAL HISTORY: Social History  Substance Use Topics  . Smoking status: Never Smoker  . Smokeless tobacco: Never Used  . Alcohol use Yes    FAMILY HISTORY: Family  History  Problem Relation Age of Onset  . Heart disease Father 3656       MI  . Hypertension Father   . Sudden death Father        Heart disease  . Hyperlipidemia Father   . Obesity Father   . Diabetes Mother        Borderline  . Hyperlipidemia Mother   . Hypertension Mother   . Depression Mother   . Obesity Mother   . Breast cancer Maternal Grandmother     ROS: Review of Systems  Constitutional: Positive for weight loss.  Cardiovascular: Negative for chest pain and claudication.  Gastrointestinal: Negative for nausea and vomiting.  Genitourinary: Negative for  frequency.  Musculoskeletal: Negative for myalgias.       Negative muscle weakness  Endo/Heme/Allergies: Negative for polydipsia.       Polyphagia Negative hypoglycemia    PHYSICAL EXAM: Blood pressure 133/81, pulse 64, temperature 97.9 F (36.6 C), temperature source Oral, height 6\' 1"  (1.854 m), weight (!) 408 lb (185.1 kg), SpO2 96 %. Body mass index is 53.83 kg/m. Physical Exam  Constitutional: He is oriented to person, place, and time. He appears well-developed and well-nourished.  Cardiovascular: Normal rate.   Pulmonary/Chest: Effort normal.  Musculoskeletal: Normal range of motion.  Neurological: He is oriented to person, place, and time.  Skin: Skin is warm and dry.  Psychiatric: He has a normal mood and affect. His behavior is normal.  Vitals reviewed.   RECENT LABS AND TESTS: BMET    Component Value Date/Time   NA 140 09/05/2016 1010   K 3.8 09/05/2016 1010   CL 99 09/05/2016 1010   CO2 27 09/05/2016 1010   GLUCOSE 85 09/05/2016 1010   GLUCOSE 92 03/20/2016 1708   BUN 13 09/05/2016 1010   CREATININE 0.73 (L) 09/05/2016 1010   CALCIUM 9.2 09/05/2016 1010   GFRNONAA 115 09/05/2016 1010   GFRAA 133 09/05/2016 1010   Lab Results  Component Value Date   HGBA1C 5.7 (H) 09/05/2016   Lab Results  Component Value Date   INSULIN 29.3 (H) 09/05/2016   CBC    Component Value Date/Time   WBC 9.4 09/05/2016 1010   WBC 11.3 (H) 04/22/2015 1615   RBC 5.35 09/05/2016 1010   RBC 5.49 04/22/2015 1615   HGB 15.2 09/05/2016 1010   HCT 44.5 09/05/2016 1010   PLT 249 09/05/2016 1010   MCV 83 09/05/2016 1010   MCH 28.4 09/05/2016 1010   MCH 28.1 10/20/2014 0700   MCHC 34.2 09/05/2016 1010   MCHC 34.1 04/22/2015 1615   RDW 14.6 09/05/2016 1010   LYMPHSABS 2.8 09/05/2016 1010   MONOABS 1.0 04/22/2015 1615   EOSABS 0.5 (H) 09/05/2016 1010   BASOSABS 0.1 09/05/2016 1010   Iron/TIBC/Ferritin/ %Sat No results found for: IRON, TIBC, FERRITIN, IRONPCTSAT Lipid Panel      Component Value Date/Time   CHOL 167 09/05/2016 1010   TRIG 92 09/05/2016 1010   HDL 39 (L) 09/05/2016 1010   CHOLHDL 4 03/20/2016 1708   VLDL 39.0 03/20/2016 1708   LDLCALC 110 (H) 09/05/2016 1010   LDLDIRECT 189.0 04/22/2015 1615   Hepatic Function Panel     Component Value Date/Time   PROT 6.7 09/05/2016 1010   ALBUMIN 4.3 09/05/2016 1010   AST 25 09/05/2016 1010   ALT 27 09/05/2016 1010   ALKPHOS 66 09/05/2016 1010   BILITOT 0.6 09/05/2016 1010   BILIDIR 0.1 02/19/2014 1048      Component  Value Date/Time   TSH 2.210 09/05/2016 1010   TSH 0.44 10/27/2013 1103   TSH 1.17 11/22/2010 1447    ASSESSMENT AND PLAN: Prediabetes - Plan: metFORMIN (GLUCOPHAGE) 500 MG tablet  Vitamin D deficiency - Plan: Vitamin D, Ergocalciferol, (DRISDOL) 50000 units CAPS capsule  Hyperlipidemia, unspecified hyperlipidemia type  At risk for diabetes mellitus  Class 3 obesity with serious comorbidity and body mass index (BMI) of 50.0 to 59.9 in adult, unspecified obesity type (HCC)  PLAN:  Vitamin D Deficiency Sean [name] was informed that low vitamin D levels contributes to fatigue and are associated with obesity, breast, and colon cancer. He agrees to start taking prescription Vit D @50 ,000 IU every week #4 with no refills and will follow up for routine testing of vitamin D, at least 2-3 times per year. He was informed of the risk of over-replacement of vitamin D and agrees to not increase his dose unless he discusses this with [name] first. N[employer] agrees to follow up with our clinic in 3 weeks.  Pre-Diabetes Sean [name] will continue to work on weight loss, exercise, and decreasing simple carbohydrates in his diet to help decrease the risk of diabetes. We dicussed metformin including benefits and risks. He was informed that eating too many simple carbohydrates or too many calories at one sitting increases the likelihood of GI side effects. Sean [name] agrees to start metformin for now and a prescription was  written today for metformin 500 mg every morning #30 with no refills. Sean [name] agreed to follow up with [name] as directed to monitor his progress.  Diabetes risk counselling Sean [name] was given extended (15 minutes) diabetes prevention counseling today. He is 41 y.o. male and has risk factors for diabetes including obesity and pre-diabetes. We discussed intensive lifestyle modifications today with an emphasis on weight loss as well as increasing exercise and decreasing simple carbohydrates in his diet.  Hyperlipidemia Sean [name] was informed of the American Heart Association Guidelines emphasizing intensive lifestyle modifications as the first line treatment for hyperlipidemia. We discussed many lifestyle modifications today in depth, and Sean [name] will continue to work on decreasing saturated fats such as fatty red meat, butter and many fried foods. He will also increase vegetables and lean protein in his diet and continue to work on exercise and weight loss efforts. We will re-check labs in 3 months and Yaseen agrees to follow up as directed.  Obesity Sean [name] is currently in the action stage of change. As such, his goal is to continue with weight loss efforts He has agreed to follow the Category 3 plan + 300 calories. Sean [name] has been instructed to work up to a goal of 150 minutes of combined cardio and strengthening exercise per week for weight loss and overall health benefits. We discussed the following Behavioral Modification Strategies today: meal planning & cooking strategies, increasing lean protein intake and decreasing simple carbohydrates   Sean [name] has agreed to follow up with our clinic in 3 weeks and with our dietician in 1 1/2 weeks. He was informed of the importance of frequent follow up visits to maximize his success with intensive lifestyle modifications for his multiple health conditions.  I, Nevada Crane, am acting as transcriptionist for Quillian Quince, MD  I have reviewed the above documentation for accuracy  and completeness, and I agree with the above. -Quillian Quince, MD    OBESITY BEHAVIORAL INTERVENTION VISIT  Today's visit was # 2 out of 22.  Starting weight: 416 lbs Starting date: 09/05/16 Today's weight : 408 lbs  Today's date:  09/19/2016 Total lbs lost to date: 8 (Patients must lose 7 lbs in the first 6 months to continue with counseling)   ASK: We discussed the diagnosis of obesity with Sean [name] today and Sean [name] agreed to give [name] permission to discuss obesity behavioral modification therapy today.  ASSESS: Sean [name] has the diagnosis of obesity and his BMI today is 53.9 Sean [name] is in the action stage of change   ADVISE: Sean [name] was educated on the multiple health risks of obesity as well as the benefit of weight loss to improve his health. He was advised of the need for long term treatment and the importance of lifestyle modifications.  AGREE: Multiple dietary modification options and treatment options were discussed and  Sean [name] agreed to follow the Category 3 plan + 300 calories. We discussed the following Behavioral Modification Strategies today: meal planning & cooking strategies, increasing lean protein intake and decreasing simple carbohydrates

## 2016-09-21 ENCOUNTER — Ambulatory Visit: Payer: PRIVATE HEALTH INSURANCE

## 2016-09-26 ENCOUNTER — Ambulatory Visit: Payer: PRIVATE HEALTH INSURANCE | Admitting: Physical Therapy

## 2016-09-26 DIAGNOSIS — R262 Difficulty in walking, not elsewhere classified: Secondary | ICD-10-CM

## 2016-09-26 DIAGNOSIS — M25561 Pain in right knee: Secondary | ICD-10-CM

## 2016-09-26 DIAGNOSIS — R2689 Other abnormalities of gait and mobility: Secondary | ICD-10-CM

## 2016-09-26 NOTE — Therapy (Signed)
[facility] [address]  [address] [place], [place], [address] Phone: [phone]   Fax:  [phone]  Physical Therapy Treatment  Patient Details  Name: Sean [name] MRN: [id number] Date of Birth: 12/05/1975 Referring Provider: Dr. Antoine Primas  Encounter Date: 09/26/2016      PT End of Session - 09/26/16 1705    Visit Number 3   Number of Visits 12   Date for PT Re-Evaluation 10/24/16   PT Start Time 1701   PT Stop Time 1739   PT Time Calculation (min) 38 min   Activity Tolerance Patient tolerated treatment well   Behavior During Therapy [facility] for tasks assessed/performed      Past Medical History:  Diagnosis Date  . Heartburn   . Hyperlipidemia   . Hypertension   . Sleep apnea   . SOB (shortness of breath) on exertion   . Swelling    feet and legs    No past surgical history on file.  There were no vitals filed for this visit.      Subjective Assessment - 09/26/16 1705    Subjective worked 13 hours yesterday - some improvements in pain   Diagnostic tests Xray: Age advanced patellofemoral right knee joint degeneration   Patient Stated Goals return to leisure activities/work without pain   Currently in Pain? Yes   Pain Score 3    Pain Location Knee   Pain Orientation Right   Pain Descriptors / Indicators Aching;Sore   Pain Type Chronic pain                         [facility] Adult PT Treatment/Exercise - 09/26/16 1707      Knee/Hip Exercises: Aerobic   Nustep L6 x 6 min     Knee/Hip Exercises: Machines for Strengthening   Cybex Knee Extension 35# B LEx 15; 25# B con/R ecc x 15     Knee/Hip Exercises: Standing   Forward Lunges Right;Left;10 reps   Forward Lunges Limitations TRX   Functional Squat 15 reps   Functional Squat Limitations TRX   Other Standing Knee Exercises side stepping - red tband x 30 feet each direction; fwd monster walks - red tband 1 lap around gym; bwd monster  walks red tband x 30 feet     Modalities   Modalities Iontophoresis     Iontophoresis   Type of Iontophoresis Dexamethasone   Location R knee   Dose 1.0 mL   Time 4-6 hours; 80 mA                  PT Short Term Goals - 09/26/16 1706      PT SHORT TERM GOAL #1   Title patient to be independent with initial HEP    Status On-going           PT Long Term Goals - 09/26/16 1706      PT LONG TERM GOAL #1   Title patient to be independent with advanced HEP    Status On-going     PT LONG TERM GOAL #2   Title patient to report ability to stand/walk for 3 hours for job related duties without knee pain limiting   Status On-going     PT LONG TERM GOAL #3   Title patient to improve R LE strength to >/= 4+/5   Status On-going     PT LONG TERM GOAL #4   Title patient  to report pai reduction by >/= 50% for greater than 2 weeks   Status On-going               Plan - 09/26/16 1706    Clinical Impression Statement Patient doing well - noting some improvements in pain patterns since starting therapy, especially with long work hours, Good progression of strengthening with expected muscle fatigue.    PT Treatment/Interventions ADLs/Self Care Home Management;Cryotherapy;Electrical Stimulation;Iontophoresis 4mg /ml Dexamethasone;Moist Heat;Ultrasound;Neuromuscular re-education;Balance training;Therapeutic exercise;Therapeutic activities;Functional mobility training;Patient/family education;Manual techniques;Passive range of motion;Vasopneumatic Device;Taping;Dry needling   Consulted and Agree with Plan of Care Patient      Patient will benefit from skilled therapeutic intervention in order to improve the following deficits and impairments:  Abnormal gait, Decreased activity tolerance, Decreased mobility, Decreased strength, Difficulty walking, Pain  Visit Diagnosis: Acute pain of right knee  Difficulty in walking, not elsewhere classified  Other abnormalities of gait  and mobility     Problem List Patient Active Problem List   Diagnosis Date Noted  . Prediabetes 09/19/2016  . Vitamin D deficiency 09/19/2016  . Other fatigue 09/05/2016  . Dyspnea on exertion 09/05/2016  . Snoring 09/05/2016  . Class 3 obesity with serious comorbidity and body mass index (BMI) of 50.0 to 59.9 in adult (HCC) 09/05/2016  . Patellofemoral arthritis of right knee 08/11/2016  . Severe obesity (BMI >= 40) (HCC) 05/09/2015  . Hyperlipidemia 02/19/2014  . Sinusitis, acute maxillary 02/09/2014  . Infected laceration 02/09/2014  . HTN (hypertension) 03/13/2011     Kipp Laurence, PT, DPT 09/26/16 5:49 PM   [facility] [address]  [address] [place], [place], [address] Phone: [phone]   Fax:  [phone]  Name: Sean [name] MRN: [id number] Date of Birth: 1975/09/01

## 2016-09-27 ENCOUNTER — Institutional Professional Consult (permissible substitution): Payer: PRIVATE HEALTH INSURANCE | Admitting: Internal Medicine

## 2016-09-28 ENCOUNTER — Ambulatory Visit: Payer: PRIVATE HEALTH INSURANCE | Admitting: Physical Therapy

## 2016-09-28 DIAGNOSIS — R262 Difficulty in walking, not elsewhere classified: Secondary | ICD-10-CM

## 2016-09-28 DIAGNOSIS — M25561 Pain in right knee: Secondary | ICD-10-CM

## 2016-09-28 DIAGNOSIS — R2689 Other abnormalities of gait and mobility: Secondary | ICD-10-CM

## 2016-09-28 NOTE — Therapy (Signed)
[facility] [address]  [address] [place], [place], [address] Phone: [phone]   Fax:  [phone]  Physical Therapy Treatment  Patient Details  Name: Sean [name] MRN: [id number] Date of Birth: 1975-06-13 Referring Provider: Dr. Antoine Primas  Encounter Date: 09/28/2016      PT End of Session - 09/28/16 1703    Visit Number 4   Number of Visits 12   Date for PT Re-Evaluation 10/24/16   PT Start Time 1700   PT Stop Time 1740   PT Time Calculation (min) 40 min   Activity Tolerance Patient tolerated treatment well   Behavior During Therapy [facility] for tasks assessed/performed      Past Medical History:  Diagnosis Date  . Heartburn   . Hyperlipidemia   . Hypertension   . Sleep apnea   . SOB (shortness of breath) on exertion   . Swelling    feet and legs    No past surgical history on file.  There were no vitals filed for this visit.      Subjective Assessment - 09/28/16 1701    Subjective no new complaints   Diagnostic tests Xray: Age advanced patellofemoral right knee joint degeneration   Patient Stated Goals return to leisure activities/work without pain   Currently in Pain? Yes   Pain Score 2    Pain Location Knee   Pain Orientation Right   Pain Descriptors / Indicators Discomfort;Sore   Pain Type Chronic pain                         [facility] Adult PT Treatment/Exercise - 09/28/16 1705      Knee/Hip Exercises: Aerobic   Nustep L6 x 6 min     Knee/Hip Exercises: Machines for Strengthening   Cybex Knee Extension 25# B con/R ecc x 15    Cybex Knee Flexion 25# B con/R ecc x 15      Knee/Hip Exercises: Standing   Terminal Knee Extension Limitations R LE - 20 reps - blue tband   Step Down Right;15 reps;Hand Hold: 2;Step Height: 8"   Step Down Limitations eccentric   Functional Squat 15 reps   Functional Squat Limitations TRX     Knee/Hip Exercises: Seated   Long Arc Quad Right;15  reps   Long Arc Quad Weight 3 lbs.   Long [employer] Limitations with ball squeeze     Knee/Hip Exercises: Supine   Bridges Both;15 reps   Straight Leg Raises Right;15 reps   Straight Leg Raises Limitations 3#   Straight Leg Raise with External Rotation Right;15 reps   Straight Leg Raise with External Rotation Limitations 3#                  PT Short Term Goals - 09/26/16 1706      PT SHORT TERM GOAL #1   Title patient to be independent with initial HEP    Status On-going           PT Long Term Goals - 09/26/16 1706      PT LONG TERM GOAL #1   Title patient to be independent with advanced HEP    Status On-going     PT LONG TERM GOAL #2   Title patient to report ability to stand/walk for 3 hours for job related duties without knee pain limiting   Status On-going     PT LONG TERM GOAL #3  Title patient to improve R LE strength to >/= 4+/5   Status On-going     PT LONG TERM GOAL #4   Title patient to report pai reduction by >/= 50% for greater than 2 weeks   Status On-going               Plan - 09/28/16 1704    Clinical Impression Statement Patient today doing well with all strengthening based activiites. Discussed sefl quad stretching at home with good carryover. Will continue to progress towards goals.    PT Treatment/Interventions ADLs/Self Care Home Management;Cryotherapy;Electrical Stimulation;Iontophoresis 4mg /ml Dexamethasone;Moist Heat;Ultrasound;Neuromuscular re-education;Balance training;Therapeutic exercise;Therapeutic activities;Functional mobility training;Patient/family education;Manual techniques;Passive range of motion;Vasopneumatic Device;Taping;Dry needling   Consulted and Agree with Plan of Care Patient      Patient will benefit from skilled therapeutic intervention in order to improve the following deficits and impairments:  Abnormal gait, Decreased activity tolerance, Decreased mobility, Decreased strength, Difficulty walking,  Pain  Visit Diagnosis: Acute pain of right knee  Difficulty in walking, not elsewhere classified  Other abnormalities of gait and mobility     Problem List Patient Active Problem List   Diagnosis Date Noted  . Prediabetes 09/19/2016  . Vitamin D deficiency 09/19/2016  . Other fatigue 09/05/2016  . Dyspnea on exertion 09/05/2016  . Snoring 09/05/2016  . Class 3 obesity with serious comorbidity and body mass index (BMI) of 50.0 to 59.9 in adult (HCC) 09/05/2016  . Patellofemoral arthritis of right knee 08/11/2016  . Severe obesity (BMI >= 40) (HCC) 05/09/2015  . Hyperlipidemia 02/19/2014  . Sinusitis, acute maxillary 02/09/2014  . Infected laceration 02/09/2014  . HTN (hypertension) 03/13/2011     Kipp Laurence, PT, DPT 09/28/16 5:50 PM   [facility] [address]  [address] [place], [place], [address] Phone: [phone]   Fax:  [phone]  Name: Sean [name] MRN: [id number] Date of Birth: 21-Sep-1975

## 2016-10-02 ENCOUNTER — Encounter (INDEPENDENT_AMBULATORY_CARE_PROVIDER_SITE_OTHER): Payer: Self-pay

## 2016-10-02 ENCOUNTER — Telehealth (INDEPENDENT_AMBULATORY_CARE_PROVIDER_SITE_OTHER): Payer: Self-pay

## 2016-10-02 ENCOUNTER — Ambulatory Visit (INDEPENDENT_AMBULATORY_CARE_PROVIDER_SITE_OTHER): Payer: PRIVATE HEALTH INSURANCE | Admitting: Dietician

## 2016-10-02 NOTE — Telephone Encounter (Signed)
Phone call has been completed.   

## 2016-10-02 NOTE — Telephone Encounter (Deleted)
Call complete

## 2016-10-03 ENCOUNTER — Ambulatory Visit: Payer: PRIVATE HEALTH INSURANCE | Admitting: Physical Therapy

## 2016-10-03 DIAGNOSIS — M25561 Pain in right knee: Secondary | ICD-10-CM | POA: Diagnosis not present

## 2016-10-03 DIAGNOSIS — R262 Difficulty in walking, not elsewhere classified: Secondary | ICD-10-CM

## 2016-10-03 DIAGNOSIS — R2689 Other abnormalities of gait and mobility: Secondary | ICD-10-CM

## 2016-10-03 NOTE — Therapy (Addendum)
[facility] [address]  [place], [place], [address] Phone: [phone]   Fax:  [phone]  Physical Therapy Treatment  Patient Details  Name: Sean [name] MRN: [id number] Date of Birth: Apr 11, 1975 Referring Provider: Dr. Hulan Saas  Encounter Date: 10/03/2016      PT End of Session - 10/03/16 1701    Visit Number 5   Number of Visits 12   Date for PT Re-Evaluation 10/24/16   PT Start Time 1701   PT Stop Time 1746   PT Time Calculation (min) 45 min   Activity Tolerance Patient tolerated treatment well   Behavior During Therapy [facility] for tasks assessed/performed      Past Medical History:  Diagnosis Date  . Heartburn   . Hyperlipidemia   . Hypertension   . Sleep apnea   . SOB (shortness of breath) on exertion   . Swelling    feet and legs    No past surgical history on file.  There were no vitals filed for this visit.      Subjective Assessment - 10/03/16 1707    Subjective Pt noting benefit from PT, with pain not progressing as much as it used to during the day. Not needin to take pain meds as often.   Diagnostic tests Xray: Age advanced patellofemoral right knee joint degeneration   Patient Stated Goals return to leisure activities/work without pain   Currently in Pain? Yes   Pain Score 3    Pain Location Knee   Pain Orientation Right   Pain Type Chronic pain   Pain Frequency Intermittent   Aggravating Factors  increased as day progresses - esp with standing and walking   Pain Relieving Factors ibuprofen            [facility] PT Assessment - 10/03/16 1701      Assessment   Medical Diagnosis Patellofemoral arthritis of R knee   Referring Provider Dr. Hulan Saas   Next MD Visit 10/06/16     AROM   Right Knee Flexion 122     Strength   Right Hip Flexion 4+/5   Left Hip Flexion 4+/5   Right Knee Flexion 4/5   Right Knee Extension 4+/5   Left Knee Flexion 4/5   Left Knee  Extension 4+/5                     [facility] Adult PT Treatment/Exercise - 10/03/16 1701      Exercises   Exercises Knee/Hip     Knee/Hip Exercises: Aerobic   Nustep L6 x 8 min     Knee/Hip Exercises: Standing   Hip Flexion Right;Left;10 reps;Knee straight;Stengthening   Hip Flexion Limitations green TB, 1 pole A   Hip ADduction Right;Left;10 reps;Strengthening   Hip ADduction Limitations green TB, 1 pole A   Hip Abduction Right;Left;10 reps;Knee straight;Stengthening   Abduction Limitations green TB, 1 pole A   Hip Extension Right;Left;10 reps;Knee straight;Stengthening   Extension Limitations green TB, 1 pole A   Forward Step Up Right;15 reps;Step Height: 8";Hand Hold: 1   Forward Step Up Limitations + TKE with blue TB   Step Down Right;10 reps;Step Height: 8";Hand Hold: 1   Step Down Limitations eccentric + blue TB TKE   Functional Squat 15 reps   Functional Squat Limitations TRX + B heel raise with return to standing  PT Short Term Goals - 10/03/16 1710      PT SHORT TERM GOAL #1   Title patient to be independent with initial HEP    Status Achieved           PT Long Term Goals - 09/26/16 1706      PT LONG TERM GOAL #1   Title patient to be independent with advanced HEP    Status On-going     PT LONG TERM GOAL #2   Title patient to report ability to stand/walk for 3 hours for job related duties without knee pain limiting   Status On-going     PT LONG TERM GOAL #3   Title patient to improve R LE strength to >/= 4+/5   Status On-going     PT LONG TERM GOAL #4   Title patient to report pai reduction by >/= 50% for greater than 2 weeks   Status On-going               Plan - 10/03/16 1728    Clinical Impression Statement Sean [name] noting benefit from PT thus far, with decreasing pain intensity and increasing activity tolerance before onset of pain or limitation due to pain. R knee ROM now symmetrical with L and overall B  LE strength improving. Pt progressing as expected with PT POC and will continue to benefit from skilled PT for further strengthening and propriocpetive training.   Rehab Potential Good   PT Treatment/Interventions ADLs/Self Care Home Management;Cryotherapy;Electrical Stimulation;Iontophoresis 3m/ml Dexamethasone;Moist Heat;Ultrasound;Neuromuscular re-education;Balance training;Therapeutic exercise;Therapeutic activities;Functional mobility training;Patient/family education;Manual techniques;Passive range of motion;Vasopneumatic Device;Taping;Dry needling   Consulted and Agree with Plan of Care Patient      Patient will benefit from skilled therapeutic intervention in order to improve the following deficits and impairments:  Abnormal gait, Decreased activity tolerance, Decreased mobility, Decreased strength, Difficulty walking, Pain  Visit Diagnosis: Acute pain of right knee  Difficulty in walking, not elsewhere classified  Other abnormalities of gait and mobility     Problem List Patient Active Problem List   Diagnosis Date Noted  . Prediabetes 09/19/2016  . Vitamin D deficiency 09/19/2016  . Other fatigue 09/05/2016  . Dyspnea on exertion 09/05/2016  . Snoring 09/05/2016  . Class 3 obesity with serious comorbidity and body mass index (BMI) of 50.0 to 59.9 in adult ([facility] 09/05/2016  . Patellofemoral arthritis of right knee 08/11/2016  . Severe obesity (BMI >= 40) ([facility] 05/09/2015  . Hyperlipidemia 02/19/2014  . Sinusitis, acute maxillary 02/09/2014  . Infected laceration 02/09/2014  . HTN (hypertension) 03/13/2011    JPercival Spanish PT, MPT 10/03/2016, 5:56 PM   PHYSICAL THERAPY DISCHARGE SUMMARY  Visits from Start of Care: 5  Current functional level related to goals / functional outcomes: See above   Remaining deficits: See above   Education / Equipment: HEP  Plan: Patient agrees to discharge.  Patient goals were partially met. Patient is being discharged due to  the patient's request.  ?????     SLanney Gins PT, DPT 11/01/16 8:25 AM   [facility] [place] [address]Phone: [phone]  Fax:  [phone] Name: Sean LARMONMRN: [id number] of Birth: 1Sep 30, 1977

## 2016-10-05 ENCOUNTER — Ambulatory Visit: Payer: PRIVATE HEALTH INSURANCE | Admitting: Physical Therapy

## 2016-10-06 ENCOUNTER — Ambulatory Visit: Payer: PRIVATE HEALTH INSURANCE | Admitting: Family Medicine

## 2016-10-10 ENCOUNTER — Telehealth: Payer: Self-pay | Admitting: Family Medicine

## 2016-10-10 ENCOUNTER — Ambulatory Visit: Payer: PRIVATE HEALTH INSURANCE | Admitting: Family Medicine

## 2016-10-10 ENCOUNTER — Ambulatory Visit (INDEPENDENT_AMBULATORY_CARE_PROVIDER_SITE_OTHER): Payer: PRIVATE HEALTH INSURANCE | Admitting: Family Medicine

## 2016-10-10 ENCOUNTER — Ambulatory Visit: Payer: PRIVATE HEALTH INSURANCE | Admitting: Physical Therapy

## 2016-10-10 VITALS — BP 131/84 | HR 95 | Temp 98.9°F | Ht 73.0 in | Wt >= 6400 oz

## 2016-10-10 DIAGNOSIS — E559 Vitamin D deficiency, unspecified: Secondary | ICD-10-CM

## 2016-10-10 DIAGNOSIS — IMO0001 Reserved for inherently not codable concepts without codable children: Secondary | ICD-10-CM

## 2016-10-10 DIAGNOSIS — R7303 Prediabetes: Secondary | ICD-10-CM

## 2016-10-10 DIAGNOSIS — E669 Obesity, unspecified: Secondary | ICD-10-CM

## 2016-10-10 DIAGNOSIS — Z6841 Body Mass Index (BMI) 40.0 and over, adult: Secondary | ICD-10-CM | POA: Diagnosis not present

## 2016-10-10 MED ORDER — VITAMIN D (ERGOCALCIFEROL) 1.25 MG (50000 UNIT) PO CAPS: 50000.0000 [IU] | ORAL_CAPSULE | ORAL | 0 refills | Status: DC

## 2016-10-10 MED ORDER — METFORMIN HCL 500 MG PO TABS: 500.0000 mg | ORAL_TABLET | Freq: Every day | ORAL | 0 refills | Status: DC

## 2016-10-10 NOTE — Telephone Encounter (Signed)
No charge but block please

## 2016-10-10 NOTE — Telephone Encounter (Signed)
Pt called in at 12:11 to make provider aware that he is unable to make apt today. Pt is stuck at work and cant get off. He said that they are short staff. He will call back to reschedule.

## 2016-10-10 NOTE — Progress Notes (Signed)
Office: [phone]  /  Fax: [phone]   HPI:   Chief Complaint: OBESITY Sean [name] is here to discuss his progress with his obesity treatment plan. He is on the Category 3 plan +300 calories and is following his eating plan approximately 25 % of the time. He states he is exercising 0 minutes 0 times per week. Sean [name] is more off track with big changes in his schedule while caring for mother post op and increased temptations and increased stress. Sean [name] is doing rehab for his right knee as exercise also. He works on his feet all day around food. His weight is (!) 409 lb (185.5 kg) today and has had a weight gain of 1 pound over a period of 3 weeks since his last visit. He has lost 7 lbs since starting treatment with [name].  Vitamin D deficiency Sean [name] has a diagnosis of vitamin D deficiency. He is currently stable on vit D and denies nausea, vomiting or muscle weakness.  Pre-Diabetes Sean [name] has a diagnosis of pre-diabetes based on his elevated Hgb A1c and was informed this puts him at greater risk of developing diabetes. He is stable on metformin currently and continues to work on diet and exercise to decrease risk of diabetes. He denies nausea, vomiting or hypoglycemia.   ALLERGIES: No Known Allergies  MEDICATIONS: Current Outpatient Prescriptions on File Prior to Visit  Medication Sig Dispense Refill  . amLODipine (NORVASC) 10 MG tablet Take 1 tablet (10 mg total) by mouth daily. 90 tablet 3  . atorvastatin (LIPITOR) 20 MG tablet TAKE ONE TABLET BY MOUTH ONCE DAILY 90 tablet 0  . celecoxib (CELEBREX) 200 MG capsule Take 1 capsule (200 mg total) by mouth 2 (two) times daily. 30 capsule 5  . Glucosamine-Chondroitin-MSM (TRIPLE FLEX PO) Take by mouth.    Marland Kitchen ibuprofen (ADVIL,MOTRIN) 800 MG tablet Take 1 tablet (800 mg total) by mouth 3 (three) times daily. 21 tablet 0  . losartan-hydrochlorothiazide (HYZAAR) 100-25 MG tablet Take 1 tablet by mouth daily. 90 tablet 3  . metFORMIN (GLUCOPHAGE) 500 MG  tablet Take 1 tablet (500 mg total) by mouth daily. 30 tablet 0  . Turmeric 500 MG TABS Take by mouth.    . Vitamin D, Ergocalciferol, (DRISDOL) 50000 units CAPS capsule Take 1 capsule (50,000 Units total) by mouth every 7 (seven) days. 4 capsule 0  . calcium-vitamin D 250-100 MG-UNIT tablet Take 1 tablet by mouth 2 (two) times daily.     No current facility-administered medications on file prior to visit.     PAST MEDICAL HISTORY: Past Medical History:  Diagnosis Date  . Heartburn   . Hyperlipidemia   . Hypertension   . Sleep apnea   . SOB (shortness of breath) on exertion   . Swelling    feet and legs    PAST SURGICAL HISTORY: No past surgical history on file.  SOCIAL HISTORY: Social History  Substance Use Topics  . Smoking status: Never Smoker  . Smokeless tobacco: Never Used  . Alcohol use Yes    FAMILY HISTORY: Family History  Problem Relation Age of Onset  . Heart disease Father 2       MI  . Hypertension Father   . Sudden death Father        Heart disease  . Hyperlipidemia Father   . Obesity Father   . Diabetes Mother        Borderline  . Hyperlipidemia Mother   . Hypertension Mother   . Depression Mother   .  Obesity Mother   . Breast cancer Maternal Grandmother     ROS: Review of Systems  Constitutional: Negative for weight loss.  Gastrointestinal: Negative for nausea and vomiting.  Musculoskeletal:       Negative muscle weakness  Endo/Heme/Allergies:       Negative hypoglycemia    PHYSICAL EXAM: Blood pressure 131/84, pulse 95, temperature 98.9 F (37.2 C), temperature source Oral, height 6\' 1"  (1.854 m), weight (!) 409 lb (185.5 kg), SpO2 95 %. Body mass index is 53.96 kg/m. Physical Exam  Constitutional: He is oriented to person, place, and time. He appears well-developed and well-nourished.  Cardiovascular: Normal rate.   Pulmonary/Chest: Effort normal.  Musculoskeletal: Normal range of motion.  Neurological: He is oriented to person,  place, and time.  Skin: Skin is warm and dry.  Psychiatric: He has a normal mood and affect. His behavior is normal.  Vitals reviewed.   RECENT LABS AND TESTS: BMET    Component Value Date/Time   NA 140 09/05/2016 1010   K 3.8 09/05/2016 1010   CL 99 09/05/2016 1010   CO2 27 09/05/2016 1010   GLUCOSE 85 09/05/2016 1010   GLUCOSE 92 03/20/2016 1708   BUN 13 09/05/2016 1010   CREATININE 0.73 (L) 09/05/2016 1010   CALCIUM 9.2 09/05/2016 1010   GFRNONAA 115 09/05/2016 1010   GFRAA 133 09/05/2016 1010   Lab Results  Component Value Date   HGBA1C 5.7 (H) 09/05/2016   Lab Results  Component Value Date   INSULIN 29.3 (H) 09/05/2016   CBC    Component Value Date/Time   WBC 9.4 09/05/2016 1010   WBC 11.3 (H) 04/22/2015 1615   RBC 5.35 09/05/2016 1010   RBC 5.49 04/22/2015 1615   HGB 15.2 09/05/2016 1010   HCT 44.5 09/05/2016 1010   PLT 249 09/05/2016 1010   MCV 83 09/05/2016 1010   MCH 28.4 09/05/2016 1010   MCH 28.1 10/20/2014 0700   MCHC 34.2 09/05/2016 1010   MCHC 34.1 04/22/2015 1615   RDW 14.6 09/05/2016 1010   LYMPHSABS 2.8 09/05/2016 1010   MONOABS 1.0 04/22/2015 1615   EOSABS 0.5 (H) 09/05/2016 1010   BASOSABS 0.1 09/05/2016 1010   Iron/TIBC/Ferritin/ %Sat No results found for: IRON, TIBC, FERRITIN, IRONPCTSAT Lipid Panel     Component Value Date/Time   CHOL 167 09/05/2016 1010   TRIG 92 09/05/2016 1010   HDL 39 (L) 09/05/2016 1010   CHOLHDL 4 03/20/2016 1708   VLDL 39.0 03/20/2016 1708   LDLCALC 110 (H) 09/05/2016 1010   LDLDIRECT 189.0 04/22/2015 1615   Hepatic Function Panel     Component Value Date/Time   PROT 6.7 09/05/2016 1010   ALBUMIN 4.3 09/05/2016 1010   AST 25 09/05/2016 1010   ALT 27 09/05/2016 1010   ALKPHOS 66 09/05/2016 1010   BILITOT 0.6 09/05/2016 1010   BILIDIR 0.1 02/19/2014 1048      Component Value Date/Time   TSH 2.210 09/05/2016 1010   TSH 0.44 10/27/2013 1103   TSH 1.17 11/22/2010 1447    ASSESSMENT AND  PLAN: Prediabetes - Plan: metFORMIN (GLUCOPHAGE) 500 MG tablet  Vitamin D deficiency - Plan: Vitamin D, Ergocalciferol, (DRISDOL) 50000 units CAPS capsule  Class 3 obesity with serious comorbidity and body mass index (BMI) of 50.0 to 59.9 in adult, unspecified obesity type (HCC)  PLAN:  Vitamin D Deficiency Mattheus was informed that low vitamin D levels contributes to fatigue and are associated with obesity, breast, and colon cancer. He agrees  to continue to take prescription Vit D @50 ,000 IU every week, we will refill for 1 month and will follow up for routine testing of vitamin D, at least 2-3 times per year. He was informed of the risk of over-replacement of vitamin D and agrees to not increase his dose unless he discusses this with [name] first. Valon agrees to follow up with our clinic in 2 weeks  Pre-Diabetes Yaiden will continue to work on weight loss, exercise, and decreasing simple carbohydrates in his diet to help decrease the risk of diabetes. We dicussed metformin including benefits and risks. He was informed that eating too many simple carbohydrates or too many calories at one sitting increases the likelihood of GI side effects. Silviano agrees to continue metformin for now and a prescription was written today for 1 month refill. Daouda agreed to follow up with [name] as directed to monitor his progress.  Obesity Jahseh is currently in the action stage of change. As such, his goal is to continue with weight loss efforts He has agreed to follow the Category 3 plan +300 calories Thorn has been instructed to work up to a goal of 150 minutes of combined cardio and strengthening exercise per week for weight loss and overall health benefits. We discussed the following Behavioral Modification Strategies today: planning for success, increasing lean protein intake, decreasing simple carbohydrates , decrease eating out, work on meal planning and easy cooking plans and dealing with family or coworker sabotage  Caylin  has agreed to follow up with our clinic in 2 weeks. He was informed of the importance of frequent follow up visits to maximize his success with intensive lifestyle modifications for his multiple health conditions.  I, [place] Crane, am acting as transcriptionist for Quillian Quince, MD  I have reviewed the above documentation for accuracy and completeness, and I agree with the above. -Quillian Quince, MD   OBESITY BEHAVIORAL INTERVENTION VISIT  Today's visit was # 3 out of 22.  Starting weight: 416 lbs Starting date: 09/05/16 Today's weight : 409 lbs Today's date: 10/10/2016 Total lbs lost to date: 7 (Patients must lose 7 lbs in the first 6 months to continue with counseling)   ASK: We discussed the diagnosis of obesity with Aurea Graff today and Savvas agreed to give [name] permission to discuss obesity behavioral modification therapy today.  ASSESS: Christ has the diagnosis of obesity and his BMI today is 53.97 Natanel is in the action stage of change   ADVISE: Antwoine was educated on the multiple health risks of obesity as well as the benefit of weight loss to improve his health. He was advised of the need for long term treatment and the importance of lifestyle modifications.  AGREE: Multiple dietary modification options and treatment options were discussed and  Mase agreed to follow the Category 3 plan +300 calories We discussed the following Behavioral Modification Strategies today: planning for success, increasing lean protein intake, decreasing simple carbohydrates , decrease eating out, work on meal planning and easy cooking plans and dealing with family or coworker sabotage

## 2016-10-12 ENCOUNTER — Ambulatory Visit: Payer: PRIVATE HEALTH INSURANCE | Admitting: Physical Therapy

## 2016-10-13 ENCOUNTER — Ambulatory Visit (INDEPENDENT_AMBULATORY_CARE_PROVIDER_SITE_OTHER): Payer: PRIVATE HEALTH INSURANCE | Admitting: Family Medicine

## 2016-10-13 ENCOUNTER — Encounter: Payer: Self-pay | Admitting: Family Medicine

## 2016-10-13 DIAGNOSIS — M1711 Unilateral primary osteoarthritis, right knee: Secondary | ICD-10-CM | POA: Diagnosis not present

## 2016-10-13 NOTE — Patient Instructions (Signed)
Good to see you  Keep it up  Ice is your friend.  Stay active Get back at the gym but be careful.  See me again in 8 weeks

## 2016-10-13 NOTE — Assessment & Plan Note (Signed)
Doing significantly better at this time. Patient did well with the injection. We discussed the possibility of viscous supplementation but no need at this moment. Patient once a continuing conservative therapy and follow-up with me again in 6-8 weeks.

## 2016-10-13 NOTE — Progress Notes (Signed)
[facility] [address] [place], [place] [address] Phone: [phone] Subjective:    I'm seeing this patient by the request  of:    CC: right knee pain f/u  [id number]  Sean [name] is a 41 y.o. male coming in for follow up for right knee pain. He said that the injection from last visit helped. He is also going to physical therapy which he says has decreased his pain. He still notes some pain in the front of the knee.Patient states approximately 50% better. Feels as long as he does he exercises seems to be doing well. Patient did have knee x-rays. These were and apparently visualized by me. Right knee x-rays show advanced patellofemoral arthritis.      Past Medical History:  Diagnosis Date  . Heartburn   . Hyperlipidemia   . Hypertension   . Sleep apnea   . SOB (shortness of breath) on exertion   . Swelling    feet and legs   No past surgical history on file. Social History   Social History  . Marital status: Single    Spouse name: N/A  . Number of children: N/A  . Years of education: N/A   Occupational History  . Dietitian    Social History Main Topics  . Smoking status: Never Smoker  . Smokeless tobacco: Never Used  . Alcohol use Yes  . Drug use: No  . Sexual activity: Yes   Other Topics Concern  . None   Social History Narrative  . None   No Known Allergies Family History  Problem Relation Age of Onset  . Heart disease Father 8       MI  . Hypertension Father   . Sudden death Father        Heart disease  . Hyperlipidemia Father   . Obesity Father   . Diabetes Mother        Borderline  . Hyperlipidemia Mother   . Hypertension Mother   . Depression Mother   . Obesity Mother   . Breast cancer Maternal Grandmother      Past medical history, social, surgical and family history all reviewed in electronic medical record.  No pertanent information unless stated regarding to the chief complaint.    Review of Systems:Review of systems updated and as accurate as of 10/13/16  No headache, visual changes, nausea, vomiting, diarrhea, constipation, dizziness, abdominal pain, skin rash, fevers, chills, night sweats, weight loss, swollen lymph nodes, body aches, joint swelling, muscle aches, chest pain, shortness of breath, mood changes.   Objective  Blood pressure (!) 132/100, pulse 86, height  (1.803 m), weight (!) 412 lb (186.9 kg), SpO2 98 %. Systems examined below as of 10/13/16   General: No apparent distress alert and oriented x3 mood and affect normal, dressed appropriately.  HEENT: Pupils equal, extraocular movements intact  Respiratory: Patient's speak in full sentences and does not appear short of breath  Cardiovascular: No lower extremity edema, non tender, no erythema  Skin: Warm dry intact with no signs of infection or rash on extremities or on axial skeleton.  Abdomen: Soft nontender  Neuro: Cranial nerves II through XII are intact, neurovascularly intact in all extremities with 2+ DTRs and 2+ pulses.  Lymph: No lymphadenopathy of posterior or anterior cervical chain or axillae bilaterally.  Gait normal with good balance and coordination.  MSK:  Non tender with full range of motion and good stability and symmetric strength and  tone of shoulders, elbows, wrist, hip, and ankles bilaterally.  Knee: Right Lateral tilt of the knee Noted Mild patella noted ROM full in flexion and extension and lower leg rotation. Ligaments with solid consistent endpoints including ACL, PCL, LCL, MCL. Negative Mcmurray's, Apley's, and Thessalonian tests. Non painful patellar compression. Patellar glide with moderate to severe crepitus. Patellar and quadriceps tendons unremarkable. Hamstring and quadriceps strength is normal.  Contralateral knee unremarkable    Impression and Recommendations:     This case required medical decision making of moderate complexity.      Note: This  dictation was prepared with Dragon dictation along with smaller phrase technology. Any transcriptional errors that result from this process are unintentional.

## 2016-10-26 ENCOUNTER — Ambulatory Visit (INDEPENDENT_AMBULATORY_CARE_PROVIDER_SITE_OTHER): Payer: PRIVATE HEALTH INSURANCE | Admitting: Physician Assistant

## 2016-10-26 VITALS — BP 145/85 | HR 92 | Temp 98.1°F | Ht 71.0 in | Wt >= 6400 oz

## 2016-10-26 DIAGNOSIS — I10 Essential (primary) hypertension: Secondary | ICD-10-CM

## 2016-10-26 DIAGNOSIS — E669 Obesity, unspecified: Secondary | ICD-10-CM | POA: Diagnosis not present

## 2016-10-26 DIAGNOSIS — Z6841 Body Mass Index (BMI) 40.0 and over, adult: Secondary | ICD-10-CM | POA: Diagnosis not present

## 2016-10-26 DIAGNOSIS — IMO0001 Reserved for inherently not codable concepts without codable children: Secondary | ICD-10-CM

## 2016-10-26 NOTE — Progress Notes (Addendum)
Office: [phone]  /  Fax: [phone]   HPI:   Chief Complaint: OBESITY Sean [name] is here to discuss his progress with his obesity treatment plan. He is on the  follow the Category 3 plan and is following his eating plan approximately 40 to 50 % of the time. He states he is walking at work only for exercise daily. Sean [name] continues to do well with weight loss. He has not planned ahead as well, as he is busier taking care of a sick parent. Sean [name] has family sabotage. His weight is (!) 407 lb (184.6 kg) today and has had a weight loss of 2 pounds over a period of 2 weeks since his last visit. He has lost 9 lbs since starting treatment with [name].  Hypertension Sean [name] is a 41 y.o. male with hypertension. His blood pressure is elevated at 145/85 and he states he has been more stressed lately with taking care of his mother. Sean [name] states his blood pressure is normal at home. Sean [name] denies chest pain or shortness of breath on exertion. He is working weight loss to help control his blood pressure with the goal of decreasing his risk of heart attack and stroke. Sean [name] blood pressure is not currently controlled.   ALLERGIES: No Known Allergies  MEDICATIONS: Current Outpatient Prescriptions on File Prior to Visit  Medication Sig Dispense Refill  . amLODipine (NORVASC) 10 MG tablet Take 1 tablet (10 mg total) by mouth daily. 90 tablet 3  . atorvastatin (LIPITOR) 20 MG tablet TAKE ONE TABLET BY MOUTH ONCE DAILY 90 tablet 0  . calcium-vitamin D 250-100 MG-UNIT tablet Take 1 tablet by mouth 2 (two) times daily.    . celecoxib (CELEBREX) 200 MG capsule Take 1 capsule (200 mg total) by mouth 2 (two) times daily. 30 capsule 5  . Glucosamine-Chondroitin-MSM (TRIPLE FLEX PO) Take by mouth.    Marland Kitchen ibuprofen (ADVIL,MOTRIN) 800 MG tablet Take 1 tablet (800 mg total) by mouth 3 (three) times daily. 21 tablet 0  . losartan-hydrochlorothiazide (HYZAAR) 100-25 MG tablet Take 1 tablet by mouth daily. 90 tablet 3   . metFORMIN (GLUCOPHAGE) 500 MG tablet Take 1 tablet (500 mg total) by mouth daily. 30 tablet 0  . Turmeric 500 MG TABS Take by mouth.    . Vitamin D, Ergocalciferol, (DRISDOL) 50000 units CAPS capsule Take 1 capsule (50,000 Units total) by mouth every 7 (seven) days. 4 capsule 0   No current facility-administered medications on file prior to visit.     PAST MEDICAL HISTORY: Past Medical History:  Diagnosis Date  . Heartburn   . Hyperlipidemia   . Hypertension   . Sleep apnea   . SOB (shortness of breath) on exertion   . Swelling    feet and legs    PAST SURGICAL HISTORY: No past surgical history on file.  SOCIAL HISTORY: Social History  Substance Use Topics  . Smoking status: Never Smoker  . Smokeless tobacco: Never Used  . Alcohol use Yes    FAMILY HISTORY: Family History  Problem Relation Age of Onset  . Heart disease Father 62       MI  . Hypertension Father   . Sudden death Father        Heart disease  . Hyperlipidemia Father   . Obesity Father   . Diabetes Mother        Borderline  . Hyperlipidemia Mother   . Hypertension Mother   . Depression Mother   . Obesity Mother   .  Breast cancer Maternal Grandmother     ROS: Review of Systems  Constitutional: Positive for weight loss.  Respiratory: Negative for shortness of breath (on exertion).   Cardiovascular: Negative for chest pain.    PHYSICAL EXAM: Blood pressure (!) 145/85, pulse 92, temperature 98.1 F (36.7 C), height  (1.803 m), weight (!) 407 lb (184.6 kg), SpO2 97 %. Body mass index is 56.76 kg/m. Physical Exam  Constitutional: He is oriented to person, place, and time. He appears well-developed and well-nourished.  Cardiovascular: Normal rate.   Pulmonary/Chest: Effort normal.  Musculoskeletal: Normal range of motion.  Neurological: He is oriented to person, place, and time.  Skin: Skin is warm and dry.  Psychiatric: He has a normal mood and affect. His behavior is normal.    Vitals reviewed.   RECENT LABS AND TESTS: BMET    Component Value Date/Time   NA 140 09/05/2016 1010   K 3.8 09/05/2016 1010   CL 99 09/05/2016 1010   CO2 27 09/05/2016 1010   GLUCOSE 85 09/05/2016 1010   GLUCOSE 92 03/20/2016 1708   BUN 13 09/05/2016 1010   CREATININE 0.73 (L) 09/05/2016 1010   CALCIUM 9.2 09/05/2016 1010   GFRNONAA 115 09/05/2016 1010   GFRAA 133 09/05/2016 1010   Lab Results  Component Value Date   HGBA1C 5.7 (H) 09/05/2016   Lab Results  Component Value Date   INSULIN 29.3 (H) 09/05/2016   CBC    Component Value Date/Time   WBC 9.4 09/05/2016 1010   WBC 11.3 (H) 04/22/2015 1615   RBC 5.35 09/05/2016 1010   RBC 5.49 04/22/2015 1615   HGB 15.2 09/05/2016 1010   HCT 44.5 09/05/2016 1010   PLT 249 09/05/2016 1010   MCV 83 09/05/2016 1010   MCH 28.4 09/05/2016 1010   MCH 28.1 10/20/2014 0700   MCHC 34.2 09/05/2016 1010   MCHC 34.1 04/22/2015 1615   RDW 14.6 09/05/2016 1010   LYMPHSABS 2.8 09/05/2016 1010   MONOABS 1.0 04/22/2015 1615   EOSABS 0.5 (H) 09/05/2016 1010   BASOSABS 0.1 09/05/2016 1010   Iron/TIBC/Ferritin/ %Sat No results found for: IRON, TIBC, FERRITIN, IRONPCTSAT Lipid Panel     Component Value Date/Time   CHOL 167 09/05/2016 1010   TRIG 92 09/05/2016 1010   HDL 39 (L) 09/05/2016 1010   CHOLHDL 4 03/20/2016 1708   VLDL 39.0 03/20/2016 1708   LDLCALC 110 (H) 09/05/2016 1010   LDLDIRECT 189.0 04/22/2015 1615   Hepatic Function Panel     Component Value Date/Time   PROT 6.7 09/05/2016 1010   ALBUMIN 4.3 09/05/2016 1010   AST 25 09/05/2016 1010   ALT 27 09/05/2016 1010   ALKPHOS 66 09/05/2016 1010   BILITOT 0.6 09/05/2016 1010   BILIDIR 0.1 02/19/2014 1048      Component Value Date/Time   TSH 2.210 09/05/2016 1010   TSH 0.44 10/27/2013 1103   TSH 1.17 11/22/2010 1447    ASSESSMENT AND PLAN: Essential hypertension  Class 3 obesity with serious comorbidity and body mass index (BMI) of 50.0 to 59.9 in adult,  unspecified obesity type (HCC)  PLAN:  Hypertension We discussed sodium restriction, working on healthy weight loss, and a regular exercise program as the means to achieve improved blood pressure control. Sean [name] agreed with this plan and agreed to follow up as directed. We will continue to monitor his blood pressure as well as his progress with the above lifestyle modifications. Sean [name] will keep a blood pressure log at home  and he will continue his medications as prescribed and will watch for signs of hypotension as he continues his lifestyle modifications.  We spent > than 50% of the 15 minute visit on the counseling as documented in the note.   Obesity Sean [name] is currently in the action stage of change. As such, his goal is to continue with weight loss efforts He has agreed to keep a food journal with 600 calories and 40+ grams of protein at dinner daily and follow the Category 3 plan Sean [name] has been instructed to work up to a goal of 150 minutes of combined cardio and strengthening exercise per week for weight loss and overall health benefits. We discussed the following Behavioral Modification Strategies today: increasing lean protein intake, work on meal planning and easy cooking plans and dealing with family or coworker sabotage  Sean [name] has agreed to follow up with our clinic in 2 weeks. He was informed of the importance of frequent follow up visits to maximize his success with intensive lifestyle modifications for his multiple health conditions.  I, Sean [name], am acting as transcriptionist for Illa Level, PA-C  I have reviewed the above documentation for accuracy and completeness, and I agree with the above. -Illa Level, PA-C  I have reviewed the above note and agree with the plan. -Sean Quince, MD   OBESITY BEHAVIORAL INTERVENTION VISIT  Today's visit was # 4 out of 22.  Starting weight: 416 lbs Starting date: 09/05/16 Today's weight : 407 lbs Today's date: 10/26/2016 Total lbs  lost to date: 9 (Patients must lose 7 lbs in the first 6 months to continue with counseling)   ASK: We discussed the diagnosis of obesity with Sean [name] today and Roan agreed to give [name] permission to discuss obesity behavioral modification therapy today.  ASSESS: Montrelle has the diagnosis of obesity and his BMI today is 56.79 Taris is in the action stage of change   ADVISE: Tavari was educated on the multiple health risks of obesity as well as the benefit of weight loss to improve his health. He was advised of the need for long term treatment and the importance of lifestyle modifications.  AGREE: Multiple dietary modification options and treatment options were discussed and  Kallan agreed to keep a food journal with 600 calories and 40+ grams of protein at       daily and follow the Category 3 plan We discussed the following Behavioral Modification Strategies today: increasing lean protein intake, work on meal planning and easy cooking plans and dealing with family or coworker sabotage

## 2016-11-09 ENCOUNTER — Ambulatory Visit (INDEPENDENT_AMBULATORY_CARE_PROVIDER_SITE_OTHER): Payer: PRIVATE HEALTH INSURANCE | Admitting: Physician Assistant

## 2016-11-09 VITALS — BP 136/83 | HR 75 | Temp 98.4°F | Ht 71.0 in | Wt >= 6400 oz

## 2016-11-09 DIAGNOSIS — Z6841 Body Mass Index (BMI) 40.0 and over, adult: Secondary | ICD-10-CM

## 2016-11-09 DIAGNOSIS — E7849 Other hyperlipidemia: Secondary | ICD-10-CM

## 2016-11-09 DIAGNOSIS — Z9189 Other specified personal risk factors, not elsewhere classified: Secondary | ICD-10-CM

## 2016-11-09 DIAGNOSIS — E559 Vitamin D deficiency, unspecified: Secondary | ICD-10-CM

## 2016-11-09 MED ORDER — VITAMIN D (ERGOCALCIFEROL) 1.25 MG (50000 UNIT) PO CAPS: 50000.0000 [IU] | ORAL_CAPSULE | ORAL | 0 refills | Status: DC

## 2016-11-13 NOTE — Progress Notes (Signed)
Office: [phone]  /  Fax: [phone]   HPI:   Chief Complaint: OBESITY Sean [name] is here to discuss his progress with his obesity treatment plan. He is on the Category 3 plan and is following his eating plan approximately 50 % of the time. He states he is walking and doing outdoor activities for exercise 30 minutes 3 times per week. Kapono continues to do well with weight loss. He has been busy with work and is taking care of his mother. He has taken on additional work responsibilities and he thus has planned his meals as well. He is motivated to get back on track to continue his weight loss efforts.  His weight is (!) 406 lb (184.2 kg) today and has had a weight loss of 1 pound over a period of 2 weeks since his last visit. He has lost 10 lbs since starting treatment with [name].  Vitamin D deficiency Tucker has a diagnosis of vitamin D deficiency. He is currently taking vit D and denies nausea, vomiting or muscle weakness.  At risk for osteopenia and osteoporosis Dquan is at higher risk of osteopenia and osteoporosis due to vitamin D deficiency.   Hyperlipidemia Cj has hyperlipidemia and is currently taking Lipitor. He has been trying to improve his cholesterol levels with intensive lifestyle modification including a low saturated fat diet, exercise and weight loss. He denies any chest pain, claudication or myalgias.  ALLERGIES: No Known Allergies  MEDICATIONS: Current Outpatient Prescriptions on File Prior to Visit  Medication Sig Dispense Refill  . amLODipine (NORVASC) 10 MG tablet Take 1 tablet (10 mg total) by mouth daily. 90 tablet 3  . atorvastatin (LIPITOR) 20 MG tablet TAKE ONE TABLET BY MOUTH ONCE DAILY 90 tablet 0  . calcium-vitamin D 250-100 MG-UNIT tablet Take 1 tablet by mouth 2 (two) times daily.    . celecoxib (CELEBREX) 200 MG capsule Take 1 capsule (200 mg total) by mouth 2 (two) times daily. 30 capsule 5  . Glucosamine-Chondroitin-MSM (TRIPLE FLEX PO) Take by mouth.      Marland Kitchen ibuprofen (ADVIL,MOTRIN) 800 MG tablet Take 1 tablet (800 mg total) by mouth 3 (three) times daily. 21 tablet 0  . losartan-hydrochlorothiazide (HYZAAR) 100-25 MG tablet Take 1 tablet by mouth daily. 90 tablet 3  . metFORMIN (GLUCOPHAGE) 500 MG tablet Take 1 tablet (500 mg total) by mouth daily. 30 tablet 0  . Turmeric 500 MG TABS Take by mouth.     No current facility-administered medications on file prior to visit.     PAST MEDICAL HISTORY: Past Medical History:  Diagnosis Date  . Heartburn   . Hyperlipidemia   . Hypertension   . Sleep apnea   . SOB (shortness of breath) on exertion   . Swelling    feet and legs    PAST SURGICAL HISTORY: No past surgical history on file.  SOCIAL HISTORY: Social History  Substance Use Topics  . Smoking status: Never Smoker  . Smokeless tobacco: Never Used  . Alcohol use Yes    FAMILY HISTORY: Family History  Problem Relation Age of Onset  . Heart disease Father 3       MI  . Hypertension Father   . Sudden death Father        Heart disease  . Hyperlipidemia Father   . Obesity Father   . Diabetes Mother        Borderline  . Hyperlipidemia Mother   . Hypertension Mother   . Depression Mother   .  Obesity Mother   . Breast cancer Maternal Grandmother     ROS: Review of Systems  Constitutional: Positive for weight loss.  Cardiovascular: Negative for chest pain and claudication.  Gastrointestinal: Negative for nausea and vomiting.  Musculoskeletal: Negative for myalgias.       Negative muscle weakness    PHYSICAL EXAM: Blood pressure 136/83, pulse 75, temperature 98.4 F (36.9 C), temperature source Oral, height  (1.803 m), weight (!) 406 lb (184.2 kg), SpO2 98 %. Body mass index is 56.63 kg/m. Physical Exam  Constitutional: He is oriented to person, place, and time. He appears well-developed and well-nourished.  Cardiovascular: Normal rate.   Pulmonary/Chest: Effort normal.  Neurological: He is oriented to  person, place, and time.  Skin: Skin is warm and dry.  Psychiatric: He has a normal mood and affect. His behavior is normal.  Vitals reviewed.   RECENT LABS AND TESTS: BMET    Component Value Date/Time   NA 140 09/05/2016 1010   K 3.8 09/05/2016 1010   CL 99 09/05/2016 1010   CO2 27 09/05/2016 1010   GLUCOSE 85 09/05/2016 1010   GLUCOSE 92 03/20/2016 1708   BUN 13 09/05/2016 1010   CREATININE 0.73 (L) 09/05/2016 1010   CALCIUM 9.2 09/05/2016 1010   GFRNONAA 115 09/05/2016 1010   GFRAA 133 09/05/2016 1010   Lab Results  Component Value Date   HGBA1C 5.7 (H) 09/05/2016   Lab Results  Component Value Date   INSULIN 29.3 (H) 09/05/2016   CBC    Component Value Date/Time   WBC 9.4 09/05/2016 1010   WBC 11.3 (H) 04/22/2015 1615   RBC 5.35 09/05/2016 1010   RBC 5.49 04/22/2015 1615   HGB 15.2 09/05/2016 1010   HCT 44.5 09/05/2016 1010   PLT 249 09/05/2016 1010   MCV 83 09/05/2016 1010   MCH 28.4 09/05/2016 1010   MCH 28.1 10/20/2014 0700   MCHC 34.2 09/05/2016 1010   MCHC 34.1 04/22/2015 1615   RDW 14.6 09/05/2016 1010   LYMPHSABS 2.8 09/05/2016 1010   MONOABS 1.0 04/22/2015 1615   EOSABS 0.5 (H) 09/05/2016 1010   BASOSABS 0.1 09/05/2016 1010   Iron/TIBC/Ferritin/ %Sat No results found for: IRON, TIBC, FERRITIN, IRONPCTSAT Lipid Panel     Component Value Date/Time   CHOL 167 09/05/2016 1010   TRIG 92 09/05/2016 1010   HDL 39 (L) 09/05/2016 1010   CHOLHDL 4 03/20/2016 1708   VLDL 39.0 03/20/2016 1708   LDLCALC 110 (H) 09/05/2016 1010   LDLDIRECT 189.0 04/22/2015 1615   Hepatic Function Panel     Component Value Date/Time   PROT 6.7 09/05/2016 1010   ALBUMIN 4.3 09/05/2016 1010   AST 25 09/05/2016 1010   ALT 27 09/05/2016 1010   ALKPHOS 66 09/05/2016 1010   BILITOT 0.6 09/05/2016 1010   BILIDIR 0.1 02/19/2014 1048      Component Value Date/Time   TSH 2.210 09/05/2016 1010   TSH 0.44 10/27/2013 1103   TSH 1.17 11/22/2010 1447    ASSESSMENT AND  PLAN: Other hyperlipidemia  Vitamin D deficiency - Plan: Vitamin D, Ergocalciferol, (DRISDOL) 50000 units CAPS capsule  At risk for osteoporosis  Class 3 severe obesity with serious comorbidity and body mass index (BMI) of 50.0 to 59.9 in adult, unspecified obesity type (HCC)  PLAN:  Vitamin D Deficiency Talan was informed that low vitamin D levels contributes to fatigue and are associated with obesity, breast, and colon cancer. He agrees to continue to take prescription  Vit D ,000 IU every week #4 with no refills and will follow up for routine testing of vitamin D, at least 2-3 times per year. He was informed of the risk of over-replacement of vitamin D and agrees to not increase his dose unless he discusses this with [name] first. Simran agrees to follow up with our clinic in 2 to 3 weeks.  At risk for osteopenia and osteoporosis Skip is at risk for osteopenia and osteoporosis due to his vitamin D deficiency. He was encouraged to take his vitamin D and follow his higher calcium diet and increase strengthening exercise to help strengthen his bones and decrease his risk of osteopenia and osteoporosis.  Hyperlipidemia Sirron was informed of the American Heart Association Guidelines emphasizing intensive lifestyle modifications as the first line treatment for hyperlipidemia. We discussed many lifestyle modifications today in depth, and Haidan will continue to work on decreasing saturated fats such as fatty red meat, butter and many fried foods. He will also increase vegetables and lean protein in his diet and continue to work on exercise and weight loss efforts. Qusay will continue his medications as prescribed and will follow up with our clinic in 2 to 3 weeks.  Obesity Kelly is currently in the action stage of change. As such, his goal is to continue with weight loss efforts He has agreed to follow the Category 3 plan Edrei has been instructed to work up to a goal of 150 minutes of combined cardio and  strengthening exercise per week for weight loss and overall health benefits. We discussed the following Behavioral Modification Strategies today: increasing lean protein intake and planning for success  Johnanthony has agreed to follow up with our clinic in 2 to 3 weeks. He was informed of the importance of frequent follow up visits to maximize his success with intensive lifestyle modifications for his multiple health conditions.  I, [place] Crane, am acting as transcriptionist for Illa Level, PA-C  I have reviewed the above documentation for accuracy and completeness, and I agree with the above. -Illa Level, PA-C  I have reviewed the above note and agree with the plan. -Quillian Quince, MD  OBESITY BEHAVIORAL INTERVENTION VISIT  Today's visit was # 5 out of 22.  Starting weight: 416 lbs Starting date: 09/05/16 Today's weight : 406 lbs Today's date: 11/09/2016 Total lbs lost to date: 10 (Patients must lose 7 lbs in the first 6 months to continue with counseling)   ASK: We discussed the diagnosis of obesity with Aurea Graff today and Domique agreed to give [name] permission to discuss obesity behavioral modification therapy today.  ASSESS: Nicklos has the diagnosis of obesity and his BMI today is 56.65 Syris is in the action stage of change   ADVISE: Tyreek was educated on the multiple health risks of obesity as well as the benefit of weight loss to improve his health. He was advised of the need for long term treatment and the importance of lifestyle modifications.  AGREE: Multiple dietary modification options and treatment options were discussed and  Henrique agreed to follow the Category 3 plan We discussed the following Behavioral Modification Strategies today: increasing lean protein intake and planning for success

## 2016-11-21 ENCOUNTER — Encounter (INDEPENDENT_AMBULATORY_CARE_PROVIDER_SITE_OTHER): Payer: Self-pay

## 2016-11-21 ENCOUNTER — Ambulatory Visit (INDEPENDENT_AMBULATORY_CARE_PROVIDER_SITE_OTHER): Payer: PRIVATE HEALTH INSURANCE | Admitting: Physician Assistant

## 2016-12-07 ENCOUNTER — Institutional Professional Consult (permissible substitution): Payer: PRIVATE HEALTH INSURANCE | Admitting: Pulmonary Disease

## 2016-12-13 NOTE — Progress Notes (Deleted)
[name]  D.O. [facility] [address] [place], [place] [address] Phone: [phone] Subjective:     CC: Knee pain follow-up  FAO:[id number]:Subjective  Sean [name] is a 41 y.o. male coming in with complaint of knee pain.  Patient was seen previously and found to have severe patellofemoral arthritis.  Given injection September 08, 2016.  Had been doing well and follow-up October 13, 2016.  Patient was increasing activity.  Patient states     Past Medical History:  Diagnosis Date  . Heartburn   . Hyperlipidemia   . Hypertension   . Sleep apnea   . SOB (shortness of breath) on exertion   . Swelling    feet and legs   No past surgical history on file. Social History   Socioeconomic History  . Marital status: Single    Spouse name: Not on file  . Number of children: Not on file  . Years of education: Not on file  . Highest education level: Not on file  Social Needs  . Financial resource strain: Not on file  . Food insecurity - worry: Not on file  . Food insecurity - inability: Not on file  . Transportation needs - medical: Not on file  . Transportation needs - non-medical: Not on file  Occupational History  . Occupation: DietitianDining Services Manager  Tobacco Use  . Smoking status: Never Smoker  . Smokeless tobacco: Never Used  Substance and Sexual Activity  . Alcohol use: Yes  . Drug use: No  . Sexual activity: Yes  Other Topics Concern  . Not on file  Social History Narrative  . Not on file   No Known Allergies Family History  Problem Relation Age of Onset  . Heart disease Father [id number]       MI  . Hypertension Father   . Sudden death Father        Heart disease  . Hyperlipidemia Father   . Obesity Father   . Diabetes Mother        Borderline  . Hyperlipidemia Mother   . Hypertension Mother   . Depression Mother   . Obesity Mother   . Breast cancer Maternal Grandmother      Past medical history, social, surgical and family history all reviewed in  electronic medical record.  No pertanent information unless stated regarding to the chief complaint.   Review of Systems:Review of systems updated and as accurate as of 12/13/16  No headache, visual changes, nausea, vomiting, diarrhea, constipation, dizziness, abdominal pain, skin rash, fevers, chills, night sweats, weight loss, swollen lymph nodes, body aches, joint swelling, muscle aches, chest pain, shortness of breath, mood changes.   Objective  There were no vitals taken for this visit. Systems examined below as of 12/13/16   General: No apparent distress alert and oriented x3 mood and affect normal, dressed appropriately.  HEENT: Pupils equal, extraocular movements intact  Respiratory: Patient's speak in full sentences and does not appear short of breath  Cardiovascular: No lower extremity edema, non tender, no erythema  Skin: Warm dry intact with no signs of infection or rash on extremities or on axial skeleton.  Abdomen: Soft nontender  Neuro: Cranial nerves II through XII are intact, neurovascularly intact in all extremities with 2+ DTRs and 2+ pulses.  Lymph: No lymphadenopathy of posterior or anterior cervical chain or axillae bilaterally.  Gait normal with good balance and coordination.  MSK:  Non tender with full range of motion and good stability and  symmetric strength and tone of shoulders, elbows, wrist, hip, knee and ankles bilaterally.     Impression and Recommendations:     This case required medical decision making of moderate complexity.      Note: This dictation was prepared with Dragon dictation along with smaller phrase technology. Any transcriptional errors that result from this process are unintentional.

## 2016-12-14 ENCOUNTER — Ambulatory Visit: Payer: PRIVATE HEALTH INSURANCE | Admitting: Family Medicine

## 2016-12-18 ENCOUNTER — Other Ambulatory Visit: Payer: Self-pay | Admitting: Family Medicine

## 2016-12-18 ENCOUNTER — Other Ambulatory Visit (INDEPENDENT_AMBULATORY_CARE_PROVIDER_SITE_OTHER): Payer: Self-pay | Admitting: Family Medicine

## 2016-12-18 DIAGNOSIS — I10 Essential (primary) hypertension: Secondary | ICD-10-CM

## 2016-12-18 DIAGNOSIS — R7303 Prediabetes: Secondary | ICD-10-CM

## 2016-12-19 ENCOUNTER — Other Ambulatory Visit (INDEPENDENT_AMBULATORY_CARE_PROVIDER_SITE_OTHER): Payer: Self-pay

## 2016-12-19 DIAGNOSIS — R7303 Prediabetes: Secondary | ICD-10-CM

## 2016-12-19 DIAGNOSIS — E559 Vitamin D deficiency, unspecified: Secondary | ICD-10-CM

## 2016-12-19 MED ORDER — VITAMIN D (ERGOCALCIFEROL) 1.25 MG (50000 UNIT) PO CAPS: 50000.0000 [IU] | ORAL_CAPSULE | ORAL | 0 refills | Status: DC

## 2016-12-19 MED ORDER — METFORMIN HCL 500 MG PO TABS: 500.0000 mg | ORAL_TABLET | Freq: Every day | ORAL | 0 refills | Status: DC

## 2017-01-04 ENCOUNTER — Ambulatory Visit (INDEPENDENT_AMBULATORY_CARE_PROVIDER_SITE_OTHER): Payer: PRIVATE HEALTH INSURANCE | Admitting: Family Medicine

## 2017-01-04 VITALS — BP 130/84 | HR 70 | Temp 98.7°F | Ht 71.0 in | Wt >= 6400 oz

## 2017-01-04 DIAGNOSIS — R7303 Prediabetes: Secondary | ICD-10-CM

## 2017-01-04 DIAGNOSIS — R05 Cough: Secondary | ICD-10-CM | POA: Diagnosis not present

## 2017-01-04 DIAGNOSIS — Z6841 Body Mass Index (BMI) 40.0 and over, adult: Secondary | ICD-10-CM | POA: Diagnosis not present

## 2017-01-04 DIAGNOSIS — R059 Cough, unspecified: Secondary | ICD-10-CM

## 2017-01-04 NOTE — Progress Notes (Signed)
Office: [phone]  /  Fax: [phone]   HPI:   Chief Complaint: OBESITY Sean [name] is here to discuss his progress with his obesity treatment plan. He is on the Category 3 plan and is following his eating plan approximately 10 % of the time. He states he is exercising 0 minutes 0 times per week. Sean [name] was off track for the last few weeks with increased traveling and celebration eating. His weight is (!) 411 lb (186.4 kg) today and has had a weight loss of 5 pounds over a period of 8 weeks since his last visit. He has lost 5 lbs since starting treatment with us.  Pre-Diabetes Sean [name] has a diagnosis of pre-diabetes based on his elevated Hgb A1c and was informed this puts him at greater risk of developing diabetes. He is stable on metformin currently, but he is struggling to follow his diet prescription recently. Sean [name] continues to work on diet and exercise to decrease risk of diabetes. He denies nausea or hypoglycemia.  Post Viral Cough Dannell notes a dry non productive cough  ALLERGIES: No Known Allergies  MEDICATIONS: Current Outpatient Medications on File Prior to Visit  Medication Sig Dispense Refill  . amLODipine (NORVASC) 10 MG tablet Take 1 tablet (10 mg total) by mouth daily. 90 tablet 3  . amLODipine (NORVASC) 10 MG tablet TAKE ONE TABLET BY MOUTH ONCE DAILY 34 tablet 10  . atorvastatin (LIPITOR) 20 MG tablet TAKE 1 TABLET BY MOUTH ONCE DAILY 90 tablet 0  . calcium-vitamin D 250-100 MG-UNIT tablet Take 1 tablet by mouth 2 (two) times daily.    . celecoxib (CELEBREX) 200 MG capsule Take 1 capsule (200 mg total) by mouth 2 (two) times daily. 30 capsule 5  . Glucosamine-Chondroitin-MSM (TRIPLE FLEX PO) Take by mouth.    Marland Kitchen. ibuprofen (ADVIL,MOTRIN) 800 MG tablet Take 1 tablet (800 mg total) by mouth 3 (three) times daily. 21 tablet 0  . losartan-hydrochlorothiazide (HYZAAR) 100-25 MG tablet Take 1 tablet by mouth daily. 90 tablet 3  . metFORMIN (GLUCOPHAGE) 500 MG tablet Take 1 tablet  (500 mg total) daily by mouth. 30 tablet 0  . Turmeric 500 MG TABS Take by mouth.    . Vitamin D, Ergocalciferol, (DRISDOL) 50000 units CAPS capsule Take 1 capsule (50,000 Units total) every 7 (seven) days by mouth. 4 capsule 0   No current facility-administered medications on file prior to visit.     PAST MEDICAL HISTORY: Past Medical History:  Diagnosis Date  . Heartburn   . Hyperlipidemia   . Hypertension   . Sleep apnea   . SOB (shortness of breath) on exertion   . Swelling    feet and legs    PAST SURGICAL HISTORY: No past surgical history on file.  SOCIAL HISTORY: Social History   Tobacco Use  . Smoking status: Never Smoker  . Smokeless tobacco: Never Used  Substance Use Topics  . Alcohol use: Yes  . Drug use: No    FAMILY HISTORY: Family History  Problem Relation Age of Onset  . Heart disease Father 2156       MI  . Hypertension Father   . Sudden death Father        Heart disease  . Hyperlipidemia Father   . Obesity Father   . Diabetes Mother        Borderline  . Hyperlipidemia Mother   . Hypertension Mother   . Depression Mother   . Obesity Mother   . Breast cancer Maternal Grandmother  ROS: Review of Systems  Constitutional: Negative for weight loss.  Respiratory: Positive for cough.   Gastrointestinal: Negative for nausea.  Endo/Heme/Allergies:       Negative hypoglycemia     PHYSICAL EXAM: Blood pressure 130/84, pulse 70, temperature 98.7 F (37.1 C), temperature source Oral, height 5\' 11"  (1.803 m), weight (!) 411 lb (186.4 kg), SpO2 97 %. Body mass index is 57.32 kg/m. Physical Exam  Constitutional: He is oriented to person, place, and time. He appears well-developed and well-nourished.  Cardiovascular: Normal rate.  Pulmonary/Chest: Effort normal.  Musculoskeletal: Normal range of motion.  Neurological: He is oriented to person, place, and time.  Skin: Skin is warm and dry.  Psychiatric: He has a normal mood and affect. His  behavior is normal.  Vitals reviewed.   RECENT LABS AND TESTS: BMET    Component Value Date/Time   NA 140 09/05/2016 1010   K 3.8 09/05/2016 1010   CL 99 09/05/2016 1010   CO2 27 09/05/2016 1010   GLUCOSE 85 09/05/2016 1010   GLUCOSE 92 03/20/2016 1708   BUN 13 09/05/2016 1010   CREATININE 0.73 (L) 09/05/2016 1010   CALCIUM 9.2 09/05/2016 1010   GFRNONAA 115 09/05/2016 1010   GFRAA 133 09/05/2016 1010   Lab Results  Component Value Date   HGBA1C 5.7 (H) 09/05/2016   Lab Results  Component Value Date   INSULIN 29.3 (H) 09/05/2016   CBC    Component Value Date/Time   WBC 9.4 09/05/2016 1010   WBC 11.3 (H) 04/22/2015 1615   RBC 5.35 09/05/2016 1010   RBC 5.49 04/22/2015 1615   HGB 15.2 09/05/2016 1010   HCT 44.5 09/05/2016 1010   PLT 249 09/05/2016 1010   MCV 83 09/05/2016 1010   MCH 28.4 09/05/2016 1010   MCH 28.1 10/20/2014 0700   MCHC 34.2 09/05/2016 1010   MCHC 34.1 04/22/2015 1615   RDW 14.6 09/05/2016 1010   LYMPHSABS 2.8 09/05/2016 1010   MONOABS 1.0 04/22/2015 1615   EOSABS 0.5 (H) 09/05/2016 1010   BASOSABS 0.1 09/05/2016 1010   Iron/TIBC/Ferritin/ %Sat No results found for: IRON, TIBC, FERRITIN, IRONPCTSAT Lipid Panel     Component Value Date/Time   CHOL 167 09/05/2016 1010   TRIG 92 09/05/2016 1010   HDL 39 (L) 09/05/2016 1010   CHOLHDL 4 03/20/2016 1708   VLDL 39.0 03/20/2016 1708   LDLCALC 110 (H) 09/05/2016 1010   LDLDIRECT 189.0 04/22/2015 1615   Hepatic Function Panel     Component Value Date/Time   PROT 6.7 09/05/2016 1010   ALBUMIN 4.3 09/05/2016 1010   AST 25 09/05/2016 1010   ALT 27 09/05/2016 1010   ALKPHOS 66 09/05/2016 1010   BILITOT 0.6 09/05/2016 1010   BILIDIR 0.1 02/19/2014 1048      Component Value Date/Time   TSH 2.210 09/05/2016 1010   TSH 0.44 10/27/2013 1103   TSH 1.17 11/22/2010 1447    ASSESSMENT AND PLAN: Pre-diabetes  Cough  Class 3 severe obesity with serious comorbidity and body mass index (BMI) of  50.0 to 59.9 in adult, unspecified obesity type (HCC)  PLAN:  Pre-Diabetes Sean [name] will continue to work on weight loss, exercise, and decreasing simple carbohydrates in his diet to help decrease the risk of diabetes. We dicussed metformin including benefits and risks. He was informed that eating too many simple carbohydrates or too many calories at one sitting increases the likelihood of GI side effects. Sean [name] agrees to continue metformin as is for now  and we will change his diet to a lower simple carbohydrate plan.and he will follow up with [name] as directed to monitor his progress.  Post Viral Cough Damarian agrees to take OTC Delsym as needed and will follow up with our clinic in 2 weeks.  Obesity Marcos is currently in the action stage of change. As such, his goal is to continue with weight loss efforts He has agreed to change to follow a lower carbohydrate, vegetable and lean protein rich diet plan Barre has been instructed to work up to a goal of 150 minutes of combined cardio and strengthening exercise per week for weight loss and overall health benefits. We discussed the following Behavioral Modification Strategies today: dealing with family or coworker sabotage and better snacking choices  Dakai has agreed to follow up with our clinic in 2 weeks. He was informed of the importance of frequent follow up visits to maximize his success with intensive lifestyle modifications for his multiple health conditions.  I, Burt Knack, am acting as transcriptionist for Quillian Quince, MD  I have reviewed the above documentation for accuracy and completeness, and I agree with the above. -Quillian Quince, MD    OBESITY BEHAVIORAL INTERVENTION VISIT  Today's visit was # 6 out of 22.  Starting weight: 416 lbs Starting date: 09/05/16 Today's weight : 411 lbs Today's date: 01/04/2017 Total lbs lost to date: 5 (Patients must lose 7 lbs in the first 6 months to continue with counseling)   ASK: We discussed  the diagnosis of obesity with Aurea Graff today and Verlyn agreed to give [name] permission to discuss obesity behavioral modification therapy today.  ASSESS: Nahsir has the diagnosis of obesity and his BMI today is 57.35 Ayad is in the action stage of change   ADVISE: Aeden was educated on the multiple health risks of obesity as well as the benefit of weight loss to improve his health. He was advised of the need for long term treatment and the importance of lifestyle modifications.  AGREE: Multiple dietary modification options and treatment options were discussed and  Rhea agreed to change to follow a lower carbohydrate, vegetable and lean protein rich diet plan We discussed the following Behavioral Modification Strategies today: dealing with family or coworker sabotage and better snacking choices

## 2017-01-08 ENCOUNTER — Institutional Professional Consult (permissible substitution): Payer: PRIVATE HEALTH INSURANCE | Admitting: Internal Medicine

## 2017-01-09 ENCOUNTER — Ambulatory Visit (INDEPENDENT_AMBULATORY_CARE_PROVIDER_SITE_OTHER): Payer: PRIVATE HEALTH INSURANCE | Admitting: Pulmonary Disease

## 2017-01-09 ENCOUNTER — Encounter: Payer: Self-pay | Admitting: Pulmonary Disease

## 2017-01-09 VITALS — BP 138/84 | HR 89 | Ht 71.0 in | Wt >= 6400 oz

## 2017-01-09 DIAGNOSIS — G4733 Obstructive sleep apnea (adult) (pediatric): Secondary | ICD-10-CM | POA: Diagnosis not present

## 2017-01-09 DIAGNOSIS — R0683 Snoring: Secondary | ICD-10-CM | POA: Diagnosis not present

## 2017-01-09 DIAGNOSIS — R0609 Other forms of dyspnea: Secondary | ICD-10-CM

## 2017-01-09 NOTE — Patient Instructions (Signed)
Home sleep study  Use Delsym cough syrup 5 mL twice daily as needed for cough. Call us for antibiotic if phlegm turns yellow or green

## 2017-01-09 NOTE — Assessment & Plan Note (Signed)
Given excessive daytime somnolence, narrow pharyngeal exam, witnessed apneas & loud snoring, obstructive sleep apnea is very likely & an overnight polysomnogram will be scheduled as a home study. The pathophysiology of obstructive sleep apnea , it's cardiovascular consequences & modes of treatment including CPAP were discused with the patient in detail & they evidenced understanding.  Pretest probability is high and he will likely need a full facemask since he is a mouth breather

## 2017-01-09 NOTE — Progress Notes (Signed)
Subjective:    Patient ID: Sean [name], male    DOB: 14-Oct-1975, 41 y.o.   MRN: [id number]  HPI  Chief Complaint  Patient presents with  . Sleep Consult    Referred by Dr. Dalbert GarnetBeasley for possible OSA. Per patient's mother and girlfriend, he snores loudly at night. Gasps for air at night. Wakes up feeling tired in the mornings. Denies ever having a sleep study before.     41 year old morbidly obese man presents for evaluation of sleep disordered breathing. He has also been struggling with a dry cough for the past 4 weeks.  Loud snoring has been noted by his mother and girlfriend and they have also witnessed apneas.  He reports excessive daytime fatigue but reports an active lifestyle with hunting and fishing. Epworth sleepiness score is 7 and he reports sleepiness while lying down to rest in the afternoons when circumstances permit. Bedtime is between 8:52 PM, sleep latency can be up to 30 minutes, sleeps on his side with 2 pillows, reports 2-3 nocturnal awakenings for nocturia and is out of bed by 5 AM feeling tired with dryness of mouth but denies a headache. On weekends he usually still wakes up early but will take a midmorning nap for about 1-2 hours. There is no history suggestive of cataplexy, sleep paralysis or parasomnias  At one time he was able to lose 100 pounds with diet and exercise but now has regained and is at his heaviest weight of 415 pounds. He is a hypertensive with a prediabetic and has hyperlipidemia for which he takes medications He also reports of lymphoma. 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 Heartburn   . Hyperlipidemia   . Hypertension   . Sleep apnea   . SOB (shortness of breath) on exertion   . Swelling    feet and legs    No past  surgical history on file.  No Known Allergies  Social History   Socioeconomic History  . Marital status: Single    Spouse name: Not on file  . Number of children: Not on file  . Years of education: Not on file  . Highest education level: Not on file  Social Needs  . Financial resource strain: Not on file  . Food insecurity - worry: Not on file  . Food insecurity - inability: Not on file  . Transportation needs - medical: Not on file  . Transportation needs - non-medical: Not on file  Occupational History  . Occupation: DietitianDining Services Manager  Tobacco Use  . Smoking status: Never Smoker  . Smokeless tobacco: Never Used  Substance and Sexual Activity  . Alcohol use: Yes  . Drug use: No  . Sexual activity: Yes  Other Topics Concern  . Not on file  Social History Narrative  . Not on file      Family History  Problem Relation Age of Onset  . Heart disease Father [id number]       MI  . Hypertension Father   . Sudden death Father        Heart disease  . Hyperlipidemia Father   . Obesity Father   . Diabetes Mother        Borderline  . Hyperlipidemia Mother   . Hypertension  Mother   . Depression Mother   . Obesity Mother   . Breast cancer Maternal Grandmother      Review of Systems Positive for shortness of breath with activity, nonproductive cough  Constitutional: negative for anorexia, fevers and sweats  Eyes: negative for irritation, redness and visual disturbance  Ears, nose, mouth, throat, and face: negative for earaches, epistaxis, nasal congestion and sore throat  Respiratory: negative for dyspnea on exertion, sputum and wheezing  Cardiovascular: negative for chest pain, dyspnea, lower extremity edema, orthopnea, palpitations and syncope  Gastrointestinal: negative for abdominal pain, constipation, diarrhea, melena, nausea and vomiting  Genitourinary:negative for dysuria, frequency and hematuria  Hematologic/lymphatic: negative for bleeding, easy bruising and  lymphadenopathy  Musculoskeletal:negative for arthralgias, muscle weakness and stiff joints  Neurological: negative for coordination problems, gait problems, headaches and weakness  Endocrine: negative for diabetic symptoms including polydipsia, polyuria and weight loss      Objective:   Physical Exam  Gen. Pleasant, obese, in no distress, normal affect ENT - no lesions, no post nasal drip, class 2-3 airway Neck: No JVD, no thyromegaly, no carotid bruits Lungs: no use of accessory muscles, no dullness to percussion, decreased without rales or rhonchi  Cardiovascular: Rhythm regular, heart sounds  normal, no murmurs or gallops, no peripheral edema Abdomen: soft and non-tender, no hepatosplenomegaly, BS normal. Musculoskeletal: No deformities, no cyanosis or clubbing Neuro:  alert, non focal, no tremors       Assessment & Plan:

## 2017-01-09 NOTE — Assessment & Plan Note (Signed)
Dyspnea and cough likely related to post bronchitic cough   Use Delsym cough syrup 5 mL twice daily as needed for cough. Call us for antibiotic if phlegm turns yellow or green

## 2017-01-09 NOTE — Assessment & Plan Note (Signed)
Weight loss encouraged. Hopefully by getting the CPAP will be able to break the cycle of weight gain and poor quality sleep

## 2017-01-16 ENCOUNTER — Ambulatory Visit (INDEPENDENT_AMBULATORY_CARE_PROVIDER_SITE_OTHER): Payer: PRIVATE HEALTH INSURANCE | Admitting: Family Medicine

## 2017-01-25 ENCOUNTER — Ambulatory Visit (INDEPENDENT_AMBULATORY_CARE_PROVIDER_SITE_OTHER): Payer: PRIVATE HEALTH INSURANCE | Admitting: Family Medicine

## 2017-01-25 VITALS — BP 135/80 | HR 92 | Temp 98.3°F | Ht 71.0 in | Wt >= 6400 oz

## 2017-01-25 DIAGNOSIS — Z6841 Body Mass Index (BMI) 40.0 and over, adult: Secondary | ICD-10-CM

## 2017-01-25 DIAGNOSIS — I10 Essential (primary) hypertension: Secondary | ICD-10-CM | POA: Diagnosis not present

## 2017-01-25 DIAGNOSIS — Z9189 Other specified personal risk factors, not elsewhere classified: Secondary | ICD-10-CM

## 2017-01-25 DIAGNOSIS — E559 Vitamin D deficiency, unspecified: Secondary | ICD-10-CM

## 2017-01-25 MED ORDER — VITAMIN D (ERGOCALCIFEROL) 1.25 MG (50000 UNIT) PO CAPS: 50000.0000 [IU] | ORAL_CAPSULE | ORAL | 0 refills | Status: DC

## 2017-01-25 MED ORDER — LOSARTAN POTASSIUM-HCTZ 100-25 MG PO TABS: 1.0000 | ORAL_TABLET | Freq: Every day | ORAL | 0 refills | Status: DC

## 2017-01-25 NOTE — Progress Notes (Signed)
Office: [phone]  /  Fax: [phone]   HPI:   Chief Complaint: OBESITY Sean [name] is here to discuss his progress with his obesity treatment plan. He is on the lower carbohydrate, vegetable and lean protein rich diet plan and is following his eating plan approximately 25 to 30 % of the time. He states he is exercising cardio and treadmill for 30 minutes 3 times per week. Sean [name] is doing well mostly maintaining weight, even with increased holiday celebrations. He has started back to exercise. 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 His weight is (!) 412 lb (186.9 kg) today and has had a weight gain of 1 pounds over a period of 3 weeks since his last visit. He has lost 4 lbs since starting treatment with [name].  Hypertension Sean [name] is a 41 y.o. male with hypertension. Sean [name] denies chest pain, headache or shortness of breath on exertion. He is working weight loss to help control his blood pressure with the goal of decreasing his risk of heart attack and stroke. [name] blood pressure is well controlled on medications, diet and weight loss..  At risk for cardiovascular disease Sean [name] is at a higher than average risk for cardiovascular disease due to obesity and hypertension. He currently denies any chest pain.  Vitamin D deficiency Sean [name] has a diagnosis of vitamin D deficiency. He is on vit D prescription and last labs were not yet at goal. Sean [name] denies nausea, vomiting or muscle weakness.  ALLERGIES: No Known Allergies  MEDICATIONS: Current Outpatient Medications on File Prior to Visit  Medication Sig Dispense Refill  . amLODipine (NORVASC) 10 MG tablet Take 1 tablet (10 mg total) by mouth daily. 90 tablet 3  . atorvastatin (LIPITOR) 20 MG tablet TAKE 1 TABLET BY MOUTH ONCE DAILY 90 tablet 0  . calcium-vitamin D 250-100 MG-UNIT tablet Take 1 tablet by mouth 2 (two) times daily.    . celecoxib (CELEBREX) 200 MG capsule Take 1 capsule (200 mg total) by mouth 2 (two) times daily.  30 capsule 5  . Glucosamine-Chondroitin-MSM (TRIPLE FLEX PO) Take by mouth.    Marland Kitchen ibuprofen (ADVIL,MOTRIN) 800 MG tablet Take 1 tablet (800 mg total) by mouth 3 (three) times daily. 21 tablet 0  . metFORMIN (GLUCOPHAGE) 500 MG tablet Take 1 tablet (500 mg total) daily by mouth. 30 tablet 0  . Turmeric 500 MG TABS Take by mouth.     No current facility-administered medications on file prior to visit.     PAST MEDICAL HISTORY: Past Medical History:  Diagnosis Date  . Heartburn   . Hyperlipidemia   . Hypertension   . Sleep apnea   . SOB (shortness of breath) on exertion   . Swelling    feet and legs    PAST SURGICAL HISTORY: No past surgical history on file.  SOCIAL HISTORY: Social History   Tobacco Use  . Smoking status: Never Smoker  . Smokeless tobacco: Never Used  Substance Use Topics  . Alcohol use: Yes  . Drug use: No    FAMILY HISTORY: Family History  Problem Relation Age of Onset  . Heart disease Father 35       MI  . Hypertension Father   . Sudden death Father        Heart disease  . Hyperlipidemia Father   . Obesity Father   . Diabetes Mother        Borderline  . Hyperlipidemia Mother   . Hypertension Mother   .  Depression Mother   . Obesity Mother   . Breast cancer Maternal Grandmother     ROS: Review of Systems  Constitutional: Negative for weight loss.  Respiratory: Negative for shortness of breath (on exertion).   Cardiovascular: Negative for chest pain.  Gastrointestinal: Negative for nausea and vomiting.  Musculoskeletal:       Negative muscle weakness  Neurological: Negative for headaches.    PHYSICAL EXAM: Blood pressure 135/80, pulse 92, temperature 98.3 F (36.8 C), temperature source Oral, height 5\' 11"  (1.803 m), weight (!) 412 lb (186.9 kg), SpO2 97 %. Body mass index is 57.46 kg/m. Physical Exam  Constitutional: He is oriented to person, place, and time. He appears well-developed and well-nourished.  Cardiovascular: Normal  rate.  Pulmonary/Chest: Effort normal.  Musculoskeletal: Normal range of motion.  Neurological: He is oriented to person, place, and time.  Skin: Skin is warm and dry.  Psychiatric: He has a normal mood and affect. His behavior is normal.  Vitals reviewed.   RECENT LABS AND TESTS: BMET    Component Value Date/Time   NA 140 09/05/2016 1010   K 3.8 09/05/2016 1010   CL 99 09/05/2016 1010   CO2 27 09/05/2016 1010   GLUCOSE 85 09/05/2016 1010   GLUCOSE 92 03/20/2016 1708   BUN 13 09/05/2016 1010   CREATININE 0.73 (L) 09/05/2016 1010   CALCIUM 9.2 09/05/2016 1010   GFRNONAA 115 09/05/2016 1010   GFRAA 133 09/05/2016 1010   Lab Results  Component Value Date   HGBA1C 5.7 (H) 09/05/2016   Lab Results  Component Value Date   INSULIN 29.3 (H) 09/05/2016   CBC    Component Value Date/Time   WBC 9.4 09/05/2016 1010   WBC 11.3 (H) 04/22/2015 1615   RBC 5.35 09/05/2016 1010   RBC 5.49 04/22/2015 1615   HGB 15.2 09/05/2016 1010   HCT 44.5 09/05/2016 1010   PLT 249 09/05/2016 1010   MCV 83 09/05/2016 1010   MCH 28.4 09/05/2016 1010   MCH 28.1 10/20/2014 0700   MCHC 34.2 09/05/2016 1010   MCHC 34.1 04/22/2015 1615   RDW 14.6 09/05/2016 1010   LYMPHSABS 2.8 09/05/2016 1010   MONOABS 1.0 04/22/2015 1615   EOSABS 0.5 (H) 09/05/2016 1010   BASOSABS 0.1 09/05/2016 1010   Iron/TIBC/Ferritin/ %Sat No results found for: IRON, TIBC, FERRITIN, IRONPCTSAT Lipid Panel     Component Value Date/Time   CHOL 167 09/05/2016 1010   TRIG 92 09/05/2016 1010   HDL 39 (L) 09/05/2016 1010   CHOLHDL 4 03/20/2016 1708   VLDL 39.0 03/20/2016 1708   LDLCALC 110 (H) 09/05/2016 1010   LDLDIRECT 189.0 04/22/2015 1615   Hepatic Function Panel     Component Value Date/Time   PROT 6.7 09/05/2016 1010   ALBUMIN 4.3 09/05/2016 1010   AST 25 09/05/2016 1010   ALT 27 09/05/2016 1010   ALKPHOS 66 09/05/2016 1010   BILITOT 0.6 09/05/2016 1010   BILIDIR 0.1 02/19/2014 1048      Component Value  Date/Time   TSH 2.210 09/05/2016 1010   TSH 0.44 10/27/2013 1103   TSH 1.17 11/22/2010 1447    ASSESSMENT AND PLAN: Essential hypertension - Plan: losartan-hydrochlorothiazide (HYZAAR) 100-25 MG tablet  Vitamin D deficiency - Plan: Vitamin D, Ergocalciferol, (DRISDOL) 50000 units CAPS capsule  At risk for heart disease  Class 3 severe obesity with serious comorbidity and body mass index (BMI) of 50.0 to 59.9 in adult, unspecified obesity type (HCC)  PLAN:  Hypertension We  discussed sodium restriction, working on healthy weight loss, and a regular exercise program as the means to achieve improved blood pressure control. Sean PertEvan agreed with this plan and agreed to follow up as directed. We will check labs next month and will continue to monitor his blood pressure as well as his progress with the above lifestyle modifications. Sean [name] agrees to continue losartan-HCTZ 100-25 mg qd #30 with no refills and will watch for signs of hypotension as he continues his lifestyle modifications.  Cardiovascular risk counseling Sean [name] was given extended (15 minutes) coronary artery disease prevention counseling today. He is 41 y.o. male and has risk factors for heart disease including obesity and hypertension. We discussed intensive lifestyle modifications today with an emphasis on specific weight loss instructions and strategies. Pt was also informed of the importance of increasing exercise and decreasing saturated fats to help prevent heart disease.  Vitamin D Deficiency Sean [name] was informed that low vitamin D levels contributes to fatigue and are associated with obesity, breast, and colon cancer. He agrees to continue to take prescription Vit D @50 ,000 IU every week #4 with no refills and will follow up for routine testing of vitamin D, at least 2-3 times per year. He was informed of the risk of over-replacement of vitamin D and agrees to not increase his dose unless he discusses this with us first. Sean [name] agrees to  follow up with our clinic in 3 weeks.  Obesity Sean [name] is currently in the action stage of change. As such, his goal is to continue with weight loss efforts He has agreed to portion control better and make smarter food choices, such as increase vegetables and decrease simple carbohydrates  Sean [name] has been instructed to work up to a goal of 150 minutes of combined cardio and strengthening exercise per week for weight loss and overall health benefits. We discussed the following Behavioral Modification Strategies today: work on meal planning and easy cooking plans, travel eating strategies and celebration eating strategies Goal is to maintain over Christmas then get back to a structured meal plan.  Sean [name] has agreed to follow up with our clinic in 3 weeks. He was informed of the importance of frequent follow up visits to maximize his success with intensive lifestyle modifications for his multiple health conditions.  I, [place] CraneJoanne Murray, am acting as transcriptionist for Quillian Quincearen , MD  I have reviewed the above documentation for accuracy and completeness, and I agree with the above. -Quillian Quincearen , MD    OBESITY BEHAVIORAL INTERVENTION VISIT  Today's visit was # 7 out of 22.  Starting weight: 416 lbs Starting date: 09/05/16 Today's weight : 412 lbs Today's date: 01/25/2017 Total lbs lost to date: 4 (Patients must lose 7 lbs in the first 6 months to continue with counseling)   ASK: We discussed the diagnosis of obesity with Sean GraffEvan D Lafoy today and [place] agreed to give us permission to discuss obesity behavioral modification therapy today.  ASSESS: Sean [name] has the diagnosis of obesity and his BMI today is 57.49 Sean [name] is in the action stage of change   ADVISE: Sean [name] was educated on the multiple health risks of obesity as well as the benefit of weight loss to improve his health. He was advised of the need for long term treatment and the importance of lifestyle modifications.  AGREE: Multiple  dietary modification options and treatment options were discussed and  Briar agreed to portion control better and make smarter food choices, such as increase vegetables and decrease simple carbohydrates  We discussed  the following Behavioral Modification Strategies today: work on meal planning and easy cooking plans, travel eating strategies and celebration eating strategies

## 2017-02-15 ENCOUNTER — Ambulatory Visit (INDEPENDENT_AMBULATORY_CARE_PROVIDER_SITE_OTHER): Payer: PRIVATE HEALTH INSURANCE | Admitting: Family Medicine

## 2017-02-15 VITALS — BP 137/89 | HR 74 | Temp 97.9°F | Ht 71.0 in | Wt >= 6400 oz

## 2017-02-15 DIAGNOSIS — Z6841 Body Mass Index (BMI) 40.0 and over, adult: Secondary | ICD-10-CM

## 2017-02-15 DIAGNOSIS — F32A Depression, unspecified: Secondary | ICD-10-CM | POA: Insufficient documentation

## 2017-02-15 DIAGNOSIS — E7849 Other hyperlipidemia: Secondary | ICD-10-CM | POA: Diagnosis not present

## 2017-02-15 DIAGNOSIS — E559 Vitamin D deficiency, unspecified: Secondary | ICD-10-CM

## 2017-02-15 DIAGNOSIS — R7303 Prediabetes: Secondary | ICD-10-CM | POA: Diagnosis not present

## 2017-02-15 DIAGNOSIS — F3289 Other specified depressive episodes: Secondary | ICD-10-CM | POA: Diagnosis not present

## 2017-02-15 DIAGNOSIS — F329 Major depressive disorder, single episode, unspecified: Secondary | ICD-10-CM | POA: Insufficient documentation

## 2017-02-15 MED ORDER — BUPROPION HCL ER (SR) 150 MG PO TB12: 150.0000 mg | ORAL_TABLET | Freq: Every day | ORAL | 0 refills | Status: DC

## 2017-02-15 MED ORDER — VITAMIN D (ERGOCALCIFEROL) 1.25 MG (50000 UNIT) PO CAPS: 50000.0000 [IU] | ORAL_CAPSULE | ORAL | 0 refills | Status: DC

## 2017-02-16 LAB — COMPREHENSIVE METABOLIC PANEL
A/G RATIO: 1.7 (ref 1.2–2.2)
ALT: 33 IU/L (ref 0–44)
AST: 23 IU/L (ref 0–40)
Albumin: 4.7 g/dL (ref 3.5–5.5)
Alkaline Phosphatase: 64 IU/L (ref 39–117)
BUN/Creatinine Ratio: 16 (ref 9–20)
BUN: 14 mg/dL (ref 6–24)
Bilirubin Total: 0.7 mg/dL (ref 0.0–1.2)
CALCIUM: 9.6 mg/dL (ref 8.7–10.2)
CO2: 27 mmol/L (ref 20–29)
Chloride: 98 mmol/L (ref 96–106)
Creatinine, Ser: 0.89 mg/dL (ref 0.76–1.27)
GFR, EST AFRICAN AMERICAN: 122 mL/min/{1.73_m2} (ref >59)
GFR, EST NON AFRICAN AMERICAN: 105 mL/min/{1.73_m2} (ref >59)
GLOBULIN, TOTAL: 2.7 g/dL (ref 1.5–4.5)
Glucose: 92 mg/dL (ref 65–99)
POTASSIUM: 4 mmol/L (ref 3.5–5.2)
Sodium: 139 mmol/L (ref 134–144)
TOTAL PROTEIN: 7.4 g/dL (ref 6.0–8.5)

## 2017-02-16 LAB — HEMOGLOBIN A1C
Est. average glucose Bld gHb Est-mCnc: 111 mg/dL
Hgb A1c MFr Bld: 5.5 % (ref 4.8–5.6)

## 2017-02-16 LAB — LIPID PANEL WITH LDL/HDL RATIO
Cholesterol, Total: 187 mg/dL (ref 100–199)
HDL: 37 mg/dL — AB (ref >39)
LDL Calculated: 130 mg/dL — ABNORMAL HIGH (ref 0–99)
LDl/HDL Ratio: 3.5 ratio (ref 0.0–3.6)
TRIGLYCERIDES: 100 mg/dL (ref 0–149)
VLDL Cholesterol Cal: 20 mg/dL (ref 5–40)

## 2017-02-16 LAB — VITAMIN D 25 HYDROXY (VIT D DEFICIENCY, FRACTURES): VIT D 25 HYDROXY: 41.6 ng/mL (ref 30.0–100.0)

## 2017-02-16 LAB — INSULIN, RANDOM: INSULIN: 36.9 u[IU]/mL — AB (ref 2.6–24.9)

## 2017-02-19 NOTE — Progress Notes (Signed)
Office: [phone]  /  Fax: [phone]   HPI:   Chief Complaint: OBESITY Sean [name] is here to discuss his progress with his obesity treatment plan. His goal was to maintain weight and then get back to a structured plan after the holidays. He is following his eating plan approximately 50 % of the time. He his doing cardio and weights for 60 minutes 3 times per week. Sean [name] did better with weight loss, even over the holidays and he has started back to the gym. Sean [name] isn't following a formal plan, he is mostly controlling his portions and making smarter food choices. His weight is (!) 409 lb (185.5 kg) today and has had a weight loss of 3 pounds over a period of 3 weeks since his last visit. He has lost 7 lbs since starting treatment with [name].  Vitamin D deficiency Sean [name] has a diagnosis of vitamin D deficiency. He is stable on vit D and is due for labs. Sean [name] denies nausea, vomiting or muscle weakness.   Ref. Range 02/15/2017 08:34  Vitamin D, 25-Hydroxy Latest Ref Range: 30.0 - 100.0 ng/mL 41.6   Hyperlipidemia Sean [name] has hyperlipidemia and is on Lipitor. He is working to improve his cholesterol levels with intensive lifestyle modification including a low saturated fat diet, exercise and weight loss. He denies any chest pain, claudication or myalgias. Sean [name] is due for labs.  Pre-Diabetes Sean [name] has a diagnosis of pre-diabetes based on his elevated Hgb A1c and was informed this puts him at greater risk of developing diabetes. He is taking metformin currently and is attempting to control with diet, exercise and weight loss. He continues to work on diet and exercise to decrease the risk of diabetes. He denies nausea, vomiting or hypoglycemia.  Depression with emotional eating behaviors Sean [name] notes struggling with emotional eating and requests medications to help with this. Sean [name] struggles with emotional eating and using food for comfort to the extent that it is negatively impacting his health. He often snacks  when he is not hungry. Sean [name] sometimes feels he is out of control and then feels guilty that he made poor food choices. He has been working on behavior modification techniques to help reduce his emotional eating and has been somewhat successful. He shows no sign of suicidal or homicidal ideations.  Depression screen Sean [name] 2/9 09/05/2016 12/02/2015  Decreased Interest 3 0  Down, Depressed, Hopeless 2 0  PHQ - 2 Score 5 0  Altered sleeping 3 -  Tired, decreased energy 3 -  Change in appetite 3 -  Feeling bad or failure about yourself  2 -  Moving slowly or fidgety/restless 0 -  Suicidal thoughts 0 -  PHQ-9 Score 16 -     ALLERGIES: No Known Allergies  MEDICATIONS: Current Outpatient Medications on File Prior to Visit  Medication Sig Dispense Refill  . amLODipine (NORVASC) 10 MG tablet Take 1 tablet (10 mg total) by mouth daily. 90 tablet 3  . atorvastatin (LIPITOR) 20 MG tablet TAKE 1 TABLET BY MOUTH ONCE DAILY 90 tablet 0  . calcium-vitamin D 250-100 MG-UNIT tablet Take 1 tablet by mouth 2 (two) times daily.    . celecoxib (CELEBREX) 200 MG capsule Take 1 capsule (200 mg total) by mouth 2 (two) times daily. 30 capsule 5  . Glucosamine-Chondroitin-MSM (TRIPLE FLEX PO) Take by mouth.    Marland Kitchen ibuprofen (ADVIL,MOTRIN) 800 MG tablet Take 1 tablet (800 mg total) by mouth 3 (three) times daily. 21 tablet 0  . losartan-hydrochlorothiazide (HYZAAR) 100-25 MG tablet  Take 1 tablet by mouth daily. 30 tablet 0  . metFORMIN (GLUCOPHAGE) 500 MG tablet Take 1 tablet (500 mg total) daily by mouth. 30 tablet 0  . Turmeric 500 MG TABS Take by mouth.     No current facility-administered medications on file prior to visit.     PAST MEDICAL HISTORY: Past Medical History:  Diagnosis Date  . Heartburn   . Hyperlipidemia   . Hypertension   . Sleep apnea   . SOB (shortness of breath) on exertion   . Swelling    feet and legs    PAST SURGICAL HISTORY: No past surgical history on file.  SOCIAL  HISTORY: Social History   Tobacco Use  . Smoking status: Never Smoker  . Smokeless tobacco: Never Used  Substance Use Topics  . Alcohol use: Yes  . Drug use: No    FAMILY HISTORY: Family History  Problem Relation Age of Onset  . Heart disease Father 356       MI  . Hypertension Father   . Sudden death Father        Heart disease  . Hyperlipidemia Father   . Obesity Father   . Diabetes Mother        Borderline  . Hyperlipidemia Mother   . Hypertension Mother   . Depression Mother   . Obesity Mother   . Breast cancer Maternal Grandmother     ROS: Review of Systems  Constitutional: Positive for weight loss.  Cardiovascular: Negative for chest pain and claudication.  Gastrointestinal: Negative for nausea and vomiting.  Musculoskeletal: Negative for myalgias.       Negative muscle weakness  Endo/Heme/Allergies:       Negative hypoglycemia  Psychiatric/Behavioral: Positive for depression. Negative for suicidal ideas.    PHYSICAL EXAM: Blood pressure 137/89, pulse 74, temperature 97.9 F (36.6 C), temperature source Oral, height 5\' 11"  (1.803 m), weight (!) 409 lb (185.5 kg), SpO2 97 %. Body mass index is 57.04 kg/m. Physical Exam  Constitutional: He is oriented to person, place, and time. He appears well-developed and well-nourished.  Cardiovascular: Normal rate.  Pulmonary/Chest: Effort normal.  Musculoskeletal: Normal range of motion.  Neurological: He is oriented to person, place, and time.  Skin: Skin is warm and dry.  Psychiatric: He has a normal mood and affect. His behavior is normal.  Vitals reviewed.   RECENT LABS AND TESTS: BMET    Component Value Date/Time   NA 139 02/15/2017 0834   K 4.0 02/15/2017 0834   CL 98 02/15/2017 0834   CO2 27 02/15/2017 0834   GLUCOSE 92 02/15/2017 0834   GLUCOSE 92 03/20/2016 1708   BUN 14 02/15/2017 0834   CREATININE 0.89 02/15/2017 0834   CALCIUM 9.6 02/15/2017 0834   GFRNONAA 105 02/15/2017 0834   GFRAA 122  02/15/2017 0834   Lab Results  Component Value Date   HGBA1C 5.5 02/15/2017   HGBA1C 5.7 (H) 09/05/2016   Lab Results  Component Value Date   INSULIN 36.9 (H) 02/15/2017   INSULIN 29.3 (H) 09/05/2016   CBC    Component Value Date/Time   WBC 9.4 09/05/2016 1010   WBC 11.3 (H) 04/22/2015 1615   RBC 5.35 09/05/2016 1010   RBC 5.49 04/22/2015 1615   HGB 15.2 09/05/2016 1010   HCT 44.5 09/05/2016 1010   PLT 249 09/05/2016 1010   MCV 83 09/05/2016 1010   MCH 28.4 09/05/2016 1010   MCH 28.1 10/20/2014 0700   MCHC 34.2 09/05/2016 1010  MCHC 34.1 04/22/2015 1615   RDW 14.6 09/05/2016 1010   LYMPHSABS 2.8 09/05/2016 1010   MONOABS 1.0 04/22/2015 1615   EOSABS 0.5 (H) 09/05/2016 1010   BASOSABS 0.1 09/05/2016 1010   Iron/TIBC/Ferritin/ %Sat No results found for: IRON, TIBC, FERRITIN, IRONPCTSAT Lipid Panel     Component Value Date/Time   CHOL 187 02/15/2017 0834   TRIG 100 02/15/2017 0834   HDL 37 (L) 02/15/2017 0834   CHOLHDL 4 03/20/2016 1708   VLDL 39.0 03/20/2016 1708   LDLCALC 130 (H) 02/15/2017 0834   LDLDIRECT 189.0 04/22/2015 1615   Hepatic Function Panel     Component Value Date/Time   PROT 7.4 02/15/2017 0834   ALBUMIN 4.7 02/15/2017 0834   AST 23 02/15/2017 0834   ALT 33 02/15/2017 0834   ALKPHOS 64 02/15/2017 0834   BILITOT 0.7 02/15/2017 0834   BILIDIR 0.1 02/19/2014 1048      Component Value Date/Time   TSH 2.210 09/05/2016 1010   TSH 0.44 10/27/2013 1103   TSH 1.17 11/22/2010 1447     Ref. Range 02/15/2017 08:34  Vitamin D, 25-Hydroxy Latest Ref Range: 30.0 - 100.0 ng/mL 41.6    ASSESSMENT AND PLAN: Vitamin D deficiency - Plan: VITAMIN D 25 Hydroxy (Vit-D Deficiency, Fractures), Vitamin D, Ergocalciferol, (DRISDOL) 50000 units CAPS capsule  Other hyperlipidemia - Plan: Lipid Panel With LDL/HDL Ratio  Prediabetes - Plan: Comprehensive metabolic panel, Hemoglobin A1c, Insulin, random  Other depression - with emotional eating - Plan:  buPROPion (WELLBUTRIN SR) 150 MG 12 hr tablet  Class 3 severe obesity with serious comorbidity and body mass index (BMI) of 50.0 to 59.9 in adult, unspecified obesity type (HCC)  PLAN:  Vitamin D Deficiency Sean [name] was informed that low vitamin D levels contributes to fatigue and are associated with obesity, breast, and colon cancer. He agrees to continue to take prescription Vit D @50 ,000 IU every week #4 with no refills. We will check labs and will follow up for routine testing of vitamin D, at least 2-3 times per year. He was informed of the risk of over-replacement of vitamin D and agrees to not increase his dose unless he discusses this with [name] first. Sean [name] agrees to follow up with our clinic in 2 weeks.  Hyperlipidemia Sean [name] was informed of the American Heart Association Guidelines emphasizing intensive lifestyle modifications as the first line treatment for hyperlipidemia. We discussed many lifestyle modifications today in depth, and Song will continue to work on decreasing saturated fats such as fatty red meat, butter and many fried foods. He will also increase vegetables and lean protein in his diet and continue to work on exercise and weight loss efforts. We will check labs and Sean [name] agrees to continue Lipitor as prescribed.   Pre-Diabetes Sean [name] will continue to work on weight loss, exercise, and decreasing simple carbohydrates in his diet to help decrease the risk of diabetes. We dicussed metformin including benefits and risks. He was informed that eating too many simple carbohydrates or too many calories at one sitting increases the likelihood of GI side effects. Ke[place]on to continue metformin for now and a prescription was not written today. We will check labs and Sean [name] agreed to follow up with [name] as directed to monitor his progress.  Depression with Emotional Eating Behaviors We discussed behavior modification techniques today to help Marshel deal with his emotional eating and depression. He has  agreed to start Wellbutrin SR 150 mg qam #30 with no refills and follow up as directed.  Obesity Sean [name] is currently in the action stage of change. As such, his goal is to continue with weight loss efforts He has agreed to portion control better and make smarter food choices, such as increase vegetables and decrease simple carbohydrates  Sean [name] has been instructed to work up to a goal of 150 minutes of combined cardio and strengthening exercise per week for weight loss and overall health benefits. We discussed the following Behavioral Modification Strategies today: increasing lean protein intake, decreasing simple carbohydrates , increasing vegetables and work on meal planning and easy cooking plans  Sean [name] has agreed to follow up with our clinic in 2 weeks. He was informed of the importance of frequent follow up visits to maximize his success with intensive lifestyle modifications for his multiple health conditions.   OBESITY BEHAVIORAL INTERVENTION VISIT  Today's visit was # 8 out of 22.  Starting weight: 416 lbs Starting date: 09/05/16 Today's weight : 409 lbs  Today's date: 02/15/2017 Total lbs lost to date: 7 (Patients must lose 7 lbs in the first 6 months to continue with counseling)   ASK: We discussed the diagnosis of obesity with Sean [name] today and Sean [name] agreed to give [name] permission to discuss obesity behavioral modification therapy today.  ASSESS: Sean [name] has the diagnosis of obesity and his BMI today is 57.07 Sean [name] is in the action stage of change   ADVISE: Sean [name] was educated on the multiple health risks of obesity as well as the benefit of weight loss to improve his health. He was advised of the need for long term treatment and the importance of lifestyle modifications.  AGREE: Multiple dietary modification options and treatment options were discussed and  Sean [name] agreed to the above obesity treatment plan.  I, [place] Crane, am acting as transcriptionist for Quillian Quince,  MD  I have reviewed the above documentation for accuracy and completeness, and I agree with the above. -Quillian Quince, MD

## 2017-03-01 ENCOUNTER — Encounter (INDEPENDENT_AMBULATORY_CARE_PROVIDER_SITE_OTHER): Payer: Self-pay | Admitting: Family Medicine

## 2017-03-01 ENCOUNTER — Ambulatory Visit (INDEPENDENT_AMBULATORY_CARE_PROVIDER_SITE_OTHER): Payer: PRIVATE HEALTH INSURANCE | Admitting: Family Medicine

## 2017-03-01 VITALS — BP 135/86 | HR 74 | Temp 98.1°F | Ht 71.0 in | Wt >= 6400 oz

## 2017-03-01 DIAGNOSIS — G4733 Obstructive sleep apnea (adult) (pediatric): Secondary | ICD-10-CM | POA: Diagnosis not present

## 2017-03-01 DIAGNOSIS — Z6841 Body Mass Index (BMI) 40.0 and over, adult: Secondary | ICD-10-CM | POA: Diagnosis not present

## 2017-03-01 DIAGNOSIS — E559 Vitamin D deficiency, unspecified: Secondary | ICD-10-CM | POA: Diagnosis not present

## 2017-03-01 MED ORDER — VITAMIN D (ERGOCALCIFEROL) 1.25 MG (50000 UNIT) PO CAPS: 50000.0000 [IU] | ORAL_CAPSULE | ORAL | 0 refills | Status: DC

## 2017-03-01 NOTE — Progress Notes (Signed)
Office: [phone]  /  Fax: [phone]   HPI:   Chief Complaint: OBESITY Sean [name] is here to discuss his progress with his obesity treatment plan. Sean [name] is on the portion control better and make smarter food choices, such as increase vegetables and decrease simple carbohydrates and is following his eating plan approximately 75 % of the time. Sean [name] states Sean [name] is doing cardio and light weights for 60 minutes 3 times per week. Sean [name] states Sean [name] is doing better with decrease portions but not journaling.  His weight is (!) 408 lb (185.1 kg) today and has had a weight loss of 1 pound over a period of 2 weeks since his last visit. Sean [name] has lost 8 lbs since starting treatment with Sean [name].  Vitamin D deficiency Sean [name] has a diagnosis of vitamin D deficiency. Sean [name] is on prescription Vit D, improving but level not yet at goal. Sean [name] denies nausea, vomiting or muscle weakness.  ALLERGIES: No Known Allergies  MEDICATIONS: Current Outpatient Medications on File Prior to Visit  Medication Sig Dispense Refill  . amLODipine (NORVASC) 10 MG tablet Take 1 tablet (10 mg total) by mouth daily. 90 tablet 3  . atorvastatin (LIPITOR) 20 MG tablet TAKE 1 TABLET BY MOUTH ONCE DAILY 90 tablet 0  . buPROPion (WELLBUTRIN SR) 150 MG 12 hr tablet Take 1 tablet (150 mg total) by mouth daily. 30 tablet 0  . calcium-vitamin D 250-100 MG-UNIT tablet Take 1 tablet by mouth 2 (two) times daily.    . celecoxib (CELEBREX) 200 MG capsule Take 1 capsule (200 mg total) by mouth 2 (two) times daily. 30 capsule 5  . Glucosamine-Chondroitin-MSM (TRIPLE FLEX PO) Take by mouth.    Marland Kitchen. ibuprofen (ADVIL,MOTRIN) 800 MG tablet Take 1 tablet (800 mg total) by mouth 3 (three) times daily. 21 tablet 0  . losartan-hydrochlorothiazide (HYZAAR) 100-25 MG tablet Take 1 tablet by mouth daily. 30 tablet 0  . metFORMIN (GLUCOPHAGE) 500 MG tablet Take 1 tablet (500 mg total) daily by mouth. 30 tablet 0  . Turmeric 500 MG TABS Take by mouth.     No current  facility-administered medications on file prior to visit.     PAST MEDICAL HISTORY: Past Medical History:  Diagnosis Date  . Heartburn   . Hyperlipidemia   . Hypertension   . Sleep apnea   . SOB (shortness of breath) on exertion   . Swelling    feet and legs    PAST SURGICAL HISTORY: No past surgical history on file.  SOCIAL HISTORY: Social History   Tobacco Use  . Smoking status: Never Smoker  . Smokeless tobacco: Never Used  Substance Use Topics  . Alcohol use: Yes  . Drug use: No    FAMILY HISTORY: Family History  Problem Relation Age of Onset  . Heart disease Father [id number]       MI  . Hypertension Father   . Sudden death Father        Heart disease  . Hyperlipidemia Father   . Obesity Father   . Diabetes Mother        Borderline  . Hyperlipidemia Mother   . Hypertension Mother   . Depression Mother   . Obesity Mother   . Breast cancer Maternal Grandmother     ROS: Review of Systems  Constitutional: Positive for weight loss.  Gastrointestinal: Negative for nausea and vomiting.  Musculoskeletal:       Negative muscle weakness    PHYSICAL EXAM: Blood pressure 135/86, pulse 74, temperature 98.1  F (36.7 C), temperature source Oral, height 5\' 11"  (1.803 m), weight (!) 408 lb (185.1 kg), SpO2 98 %. Body mass index is 56.9 kg/m. Physical Exam  Constitutional: Sean [name] is oriented to person, place, and time. Sean [name] appears well-developed and well-nourished.  Cardiovascular: Normal rate.  Pulmonary/Chest: Effort normal.  Musculoskeletal: Normal range of motion.  Neurological: Sean [name] is oriented to person, place, and time.  Skin: Skin is warm and dry.  Psychiatric: Sean [name] has a normal mood and affect. His behavior is normal.  Vitals reviewed.   RECENT LABS AND TESTS: BMET    Component Value Date/Time   NA 139 02/15/2017 0834   K 4.0 02/15/2017 0834   CL 98 02/15/2017 0834   CO2 27 02/15/2017 0834   GLUCOSE 92 02/15/2017 0834   GLUCOSE 92 03/20/2016 1708   BUN 14  02/15/2017 0834   CREATININE 0.89 02/15/2017 0834   CALCIUM 9.6 02/15/2017 0834   GFRNONAA 105 02/15/2017 0834   GFRAA 122 02/15/2017 0834   Lab Results  Component Value Date   HGBA1C 5.5 02/15/2017   HGBA1C 5.7 (H) 09/05/2016   Lab Results  Component Value Date   INSULIN 36.9 (H) 02/15/2017   INSULIN 29.3 (H) 09/05/2016   CBC    Component Value Date/Time   WBC 9.4 09/05/2016 1010   WBC 11.3 (H) 04/22/2015 1615   RBC 5.35 09/05/2016 1010   RBC 5.49 04/22/2015 1615   HGB 15.2 09/05/2016 1010   HCT 44.5 09/05/2016 1010   PLT 249 09/05/2016 1010   MCV 83 09/05/2016 1010   MCH 28.4 09/05/2016 1010   MCH 28.1 10/20/2014 0700   MCHC 34.2 09/05/2016 1010   MCHC 34.1 04/22/2015 1615   RDW 14.6 09/05/2016 1010   LYMPHSABS 2.8 09/05/2016 1010   MONOABS 1.0 04/22/2015 1615   EOSABS 0.5 (H) 09/05/2016 1010   BASOSABS 0.1 09/05/2016 1010   Iron/TIBC/Ferritin/ %Sat No results found for: IRON, TIBC, FERRITIN, IRONPCTSAT Lipid Panel     Component Value Date/Time   CHOL 187 02/15/2017 0834   TRIG 100 02/15/2017 0834   HDL 37 (L) 02/15/2017 0834   CHOLHDL 4 03/20/2016 1708   VLDL 39.0 03/20/2016 1708   LDLCALC 130 (H) 02/15/2017 0834   LDLDIRECT 189.0 04/22/2015 1615   Hepatic Function Panel     Component Value Date/Time   PROT 7.4 02/15/2017 0834   ALBUMIN 4.7 02/15/2017 0834   AST 23 02/15/2017 0834   ALT 33 02/15/2017 0834   ALKPHOS 64 02/15/2017 0834   BILITOT 0.7 02/15/2017 0834   BILIDIR 0.1 02/19/2014 1048      Component Value Date/Time   TSH 2.210 09/05/2016 1010   TSH 0.44 10/27/2013 1103   TSH 1.17 11/22/2010 1447  Results for AADIN, GAUT (MRN [id number]) as of 03/01/2017 17:08  Ref. Range 02/15/2017 08:34  Vitamin D, 25-Hydroxy Latest Ref Range: 30.0 - 100.0 ng/mL 41.6    ASSESSMENT AND PLAN: Vitamin D deficiency - Plan: Vitamin D, Ergocalciferol, (DRISDOL) 50000 units CAPS capsule  Class 3 severe obesity with serious comorbidity and body mass index  (BMI) of 50.0 to 59.9 in adult, unspecified obesity type (HCC)  PLAN:  Vitamin D Deficiency Master was informed that low vitamin D levels contributes to fatigue and are associated with obesity, breast, and colon cancer. Sean [name] agrees to continue taking prescription Vit D @50 ,000 IU every week #4 and we will refill for 1 month. Sean [name] will follow up for routine testing of vitamin D, at least 2-3 times per  year. Sean [name] was informed of the risk of over-replacement of vitamin D and agrees to not increase his dose unless Sean [name] discusses this with [name] first. Sean [name] agrees to follow up with our clinic in 2 weeks.  Obesity Sean [name] is currently in the action stage of change. As such, his goal is to continue with weight loss efforts Sean [name] has agreed to change to keep a food journal with 1600-2000 calories and 100+ grams of protein daily Sean [name] has been instructed to work up to a goal of 150 minutes of combined cardio and strengthening exercise per week for weight loss and overall health benefits. We discussed the following Behavioral Modification Strategies today: increasing lean protein intake, decreasing simple carbohydrates  and work on meal planning and easy cooking plans    Jamai has agreed to follow up with our clinic in 2 weeks. Sean [name] was informed of the importance of frequent follow up visits to maximize his success with intensive lifestyle modifications for his multiple health conditions.   OBESITY BEHAVIORAL INTERVENTION VISIT  Today's visit was # 9 out of 22.  Starting weight: 416 lbs Starting date: 09/05/16 Today's weight : 408 lbs  Today's date: 03/01/2017 Total lbs lost to date: 8 (Patients must lose 7 lbs in the first 6 months to continue with counseling)   ASK: We discussed the diagnosis of obesity with Sean [name] today and Sean [name] agreed to give [name] permission to discuss obesity behavioral modification therapy today.  ASSESS: Sean [name] has the diagnosis of obesity and his BMI today is 56.93 Aashir is in the  action stage of change   ADVISE: Sean [name] was educated on the multiple health risks of obesity as well as the benefit of weight loss to improve his health. Sean [name] was advised of the need for long term treatment and the importance of lifestyle modifications.  AGREE: Multiple dietary modification options and treatment options were discussed and  Sean [name] agreed to the above obesity treatment plan.  I, Sean [name], am acting as transcriptionist for Sean Quince, MD  I have reviewed the above documentation for accuracy and completeness, and I agree with the above. -Sean Quince, MD

## 2017-03-02 DIAGNOSIS — G4733 Obstructive sleep apnea (adult) (pediatric): Secondary | ICD-10-CM | POA: Diagnosis not present

## 2017-03-06 ENCOUNTER — Telehealth: Payer: Self-pay | Admitting: Pulmonary Disease

## 2017-03-06 DIAGNOSIS — G4733 Obstructive sleep apnea (adult) (pediatric): Secondary | ICD-10-CM

## 2017-03-06 NOTE — Telephone Encounter (Signed)
Spoke with patient. He is aware of results. He is hesitant to start CPAP therapy due to it being uncomfortable. He had a hard time sleeping with the HST equipment on and feels it will be the same way with CPAP machine.   He wants to know if he has any other options. Oral appliance? He is currently working on losing weight.   RA, please advise. Thanks!

## 2017-03-06 NOTE — Telephone Encounter (Signed)
OSA is severe and oral appliance will not fully corrected. We can get him nasal pillows which will be least invasive type of mask

## 2017-03-06 NOTE — Telephone Encounter (Signed)
Per RA, HST shows severe OSA with 39 events per hour, even though he did not sleep well by report. Recommends RX for auto cpap 5-15cm. DL and OV in 6 weeks with TP.

## 2017-03-06 NOTE — Telephone Encounter (Signed)
Pt is returning call. Cb is [phone].

## 2017-03-06 NOTE — Telephone Encounter (Signed)
Left message for patient to call back  

## 2017-03-07 NOTE — Telephone Encounter (Signed)
Spoke with patient. He is aware that the oral appliance will not work. Wants to proceed with CPAP machine and nasal pillows.   Order will be placed.

## 2017-03-09 ENCOUNTER — Other Ambulatory Visit: Payer: Self-pay | Admitting: *Deleted

## 2017-03-09 DIAGNOSIS — G4733 Obstructive sleep apnea (adult) (pediatric): Secondary | ICD-10-CM

## 2017-03-15 ENCOUNTER — Encounter (INDEPENDENT_AMBULATORY_CARE_PROVIDER_SITE_OTHER): Payer: Self-pay | Admitting: Family Medicine

## 2017-03-15 ENCOUNTER — Ambulatory Visit (INDEPENDENT_AMBULATORY_CARE_PROVIDER_SITE_OTHER): Payer: PRIVATE HEALTH INSURANCE | Admitting: Family Medicine

## 2017-03-15 VITALS — BP 154/90 | HR 84 | Temp 98.6°F | Ht 71.0 in | Wt >= 6400 oz

## 2017-03-15 DIAGNOSIS — Z9189 Other specified personal risk factors, not elsewhere classified: Secondary | ICD-10-CM | POA: Diagnosis not present

## 2017-03-15 DIAGNOSIS — I10 Essential (primary) hypertension: Secondary | ICD-10-CM | POA: Diagnosis not present

## 2017-03-15 DIAGNOSIS — F3289 Other specified depressive episodes: Secondary | ICD-10-CM | POA: Diagnosis not present

## 2017-03-15 DIAGNOSIS — Z6841 Body Mass Index (BMI) 40.0 and over, adult: Secondary | ICD-10-CM | POA: Diagnosis not present

## 2017-03-15 MED ORDER — BUPROPION HCL ER (SR) 200 MG PO TB12: 200.0000 mg | ORAL_TABLET | Freq: Every day | ORAL | 0 refills | Status: DC

## 2017-03-15 NOTE — Progress Notes (Signed)
Office: [phone]  /  Fax: [phone]   HPI:   Chief Complaint: OBESITY Sean [name] is here to discuss his progress with his obesity treatment plan. He is on the keep a food journal with 1600 to 2000 calories and 100+ grams of protein daily and is following his eating plan approximately 50 % of the time. He states he is doing cardio 60 minutes 3 times per week. Sean [name] struggles to journal most of the time and it is hard for him to follow a structured diet prescription. He often skips lunch, then is very hungry later and tends to make less ideal choices. His weight is (!) 410 lb (186 kg) today and has had a weight gain of 2 pounds over a period of 2 weeks since his last visit. He has lost 6 lbs since starting treatment with [name].  Hypertension Sean [name] is a 42 y.o. male with hypertension. His blood pressure is elevated today at 154/90 and is normally controlled on medications. He has been diagnosed with severe sleep apnea, but is not being treated yet. Sean [name] denies chest pain or shortness of breath on exertion. He is working weight loss to help control his blood pressure with the goal of decreasing his risk of heart attack and stroke. Sean [name] blood pressure is not currently controlled.  At risk for cardiovascular disease Sean [name] is at a higher than average risk for cardiovascular disease due to obesity and hypertension. He currently denies any chest pain.  Depression with emotional eating behaviors Sean [name] is on Wellbutrin, but he is still struggling with emotional eating. Sean [name] struggles with emotional eating and using food for comfort to the extent that it is negatively impacting his health. He often snacks when he is not hungry. Sean [name] sometimes feels he is out of control and then feels guilty that he made poor food choices. He has been working on behavior modification techniques to help reduce his emotional eating and has been somewhat successful. He shows no sign of suicidal or homicidal  ideations.  Depression screen Sean [name] 2/9 09/05/2016 12/02/2015  Decreased Interest 3 0  Down, Depressed, Hopeless 2 0  PHQ - 2 Score 5 0  Altered sleeping 3 -  Tired, decreased energy 3 -  Change in appetite 3 -  Feeling bad or failure about yourself  2 -  Moving slowly or fidgety/restless 0 -  Suicidal thoughts 0 -  PHQ-9 Score 16 -      ALLERGIES: No Known Allergies  MEDICATIONS: Current Outpatient Medications on File Prior to Visit  Medication Sig Dispense Refill  . amLODipine (NORVASC) 10 MG tablet Take 1 tablet (10 mg total) by mouth daily. 90 tablet 3  . atorvastatin (LIPITOR) 20 MG tablet TAKE 1 TABLET BY MOUTH ONCE DAILY 90 tablet 0  . calcium-vitamin D 250-100 MG-UNIT tablet Take 1 tablet by mouth 2 (two) times daily.    . celecoxib (CELEBREX) 200 MG capsule Take 1 capsule (200 mg total) by mouth 2 (two) times daily. 30 capsule 5  . Glucosamine-Chondroitin-MSM (TRIPLE FLEX PO) Take by mouth.    Marland Kitchen ibuprofen (ADVIL,MOTRIN) 800 MG tablet Take 1 tablet (800 mg total) by mouth 3 (three) times daily. 21 tablet 0  . losartan-hydrochlorothiazide (HYZAAR) 100-25 MG tablet Take 1 tablet by mouth daily. 30 tablet 0  . metFORMIN (GLUCOPHAGE) 500 MG tablet Take 1 tablet (500 mg total) daily by mouth. 30 tablet 0  . Turmeric 500 MG TABS Take by mouth.    . Vitamin D,  Ergocalciferol, (DRISDOL) 50000 units CAPS capsule Take 1 capsule (50,000 Units total) by mouth every 7 (seven) days. 4 capsule 0   No current facility-administered medications on file prior to visit.     PAST MEDICAL HISTORY: Past Medical History:  Diagnosis Date  . Heartburn   . Hyperlipidemia   . Hypertension   . Sleep apnea   . SOB (shortness of breath) on exertion   . Swelling    feet and legs    PAST SURGICAL HISTORY: No past surgical history on file.  SOCIAL HISTORY: Social History   Tobacco Use  . Smoking status: Never Smoker  . Smokeless tobacco: Never Used  Substance Use Topics  . Alcohol use:  Yes  . Drug use: No    FAMILY HISTORY: Family History  Problem Relation Age of Onset  . Heart disease Father 20       MI  . Hypertension Father   . Sudden death Father        Heart disease  . Hyperlipidemia Father   . Obesity Father   . Diabetes Mother        Borderline  . Hyperlipidemia Mother   . Hypertension Mother   . Depression Mother   . Obesity Mother   . Breast cancer Maternal Grandmother     ROS: Review of Systems  Constitutional: Negative for weight loss.  Respiratory: Negative for shortness of breath (on exertion).   Cardiovascular: Negative for chest pain.  Psychiatric/Behavioral: Positive for depression. Negative for suicidal ideas.    PHYSICAL EXAM: Blood pressure (!) 154/90, pulse 84, temperature 98.6 F (37 C), temperature source Oral, height 5\' 11"  (1.803 m), weight (!) 410 lb (186 kg), SpO2 95 %. Body mass index is 57.18 kg/m. Physical Exam  Constitutional: He is oriented to person, place, and time. He appears well-developed and well-nourished.  Cardiovascular: Normal rate.  Pulmonary/Chest: Effort normal.  Musculoskeletal: Normal range of motion.  Neurological: He is oriented to person, place, and time.  Skin: Skin is warm and dry.  Psychiatric: He has a normal mood and affect. His behavior is normal.  Vitals reviewed.   RECENT LABS AND TESTS: BMET    Component Value Date/Time   NA 139 02/15/2017 0834   K 4.0 02/15/2017 0834   CL 98 02/15/2017 0834   CO2 27 02/15/2017 0834   GLUCOSE 92 02/15/2017 0834   GLUCOSE 92 03/20/2016 1708   BUN 14 02/15/2017 0834   CREATININE 0.89 02/15/2017 0834   CALCIUM 9.6 02/15/2017 0834   GFRNONAA 105 02/15/2017 0834   GFRAA 122 02/15/2017 0834   Lab Results  Component Value Date   HGBA1C 5.5 02/15/2017   HGBA1C 5.7 (H) 09/05/2016   Lab Results  Component Value Date   INSULIN 36.9 (H) 02/15/2017   INSULIN 29.3 (H) 09/05/2016   CBC    Component Value Date/Time   WBC 9.4 09/05/2016 1010   WBC  11.3 (H) 04/22/2015 1615   RBC 5.35 09/05/2016 1010   RBC 5.49 04/22/2015 1615   HGB 15.2 09/05/2016 1010   HCT 44.5 09/05/2016 1010   PLT 249 09/05/2016 1010   MCV 83 09/05/2016 1010   MCH 28.4 09/05/2016 1010   MCH 28.1 10/20/2014 0700   MCHC 34.2 09/05/2016 1010   MCHC 34.1 04/22/2015 1615   RDW 14.6 09/05/2016 1010   LYMPHSABS 2.8 09/05/2016 1010   MONOABS 1.0 04/22/2015 1615   EOSABS 0.5 (H) 09/05/2016 1010   BASOSABS 0.1 09/05/2016 1010   Iron/TIBC/Ferritin/ %Sat No  results found for: IRON, TIBC, FERRITIN, IRONPCTSAT Lipid Panel     Component Value Date/Time   CHOL 187 02/15/2017 0834   TRIG 100 02/15/2017 0834   HDL 37 (L) 02/15/2017 0834   CHOLHDL 4 03/20/2016 1708   VLDL 39.0 03/20/2016 1708   LDLCALC 130 (H) 02/15/2017 0834   LDLDIRECT 189.0 04/22/2015 1615   Hepatic Function Panel     Component Value Date/Time   PROT 7.4 02/15/2017 0834   ALBUMIN 4.7 02/15/2017 0834   AST 23 02/15/2017 0834   ALT 33 02/15/2017 0834   ALKPHOS 64 02/15/2017 0834   BILITOT 0.7 02/15/2017 0834   BILIDIR 0.1 02/19/2014 1048      Component Value Date/Time   TSH 2.210 09/05/2016 1010   TSH 0.44 10/27/2013 1103   TSH 1.17 11/22/2010 1447    ASSESSMENT AND PLAN: Essential hypertension  Other depression - with emotional eating - Plan: buPROPion (WELLBUTRIN SR) 200 MG 12 hr tablet  At risk for heart disease  Class 3 severe obesity with serious comorbidity and body mass index (BMI) of 50.0 to 59.9 in adult, unspecified obesity type (HCC)  PLAN:  Hypertension We discussed sodium restriction, working on healthy weight loss, and a regular exercise program as the means to achieve improved blood pressure control. Sean [name] agreed with this plan and agreed to follow up as directed. We will recheck blood pressure in 2 weeks and will continue to monitor his blood pressure as well as his progress with the above lifestyle modifications. He will continue his medications as prescribed and  will watch for signs of hypotension as he continues his lifestyle modifications.  Cardiovascular risk counseling Sean [name] was given extended (15 minutes) coronary artery disease prevention counseling today. He is 42 y.o. male and has risk factors for heart disease including obesity and hypertension. We discussed intensive lifestyle modifications today with an emphasis on specific weight loss instructions and strategies. Pt was also informed of the importance of increasing exercise and decreasing saturated fats to help prevent heart disease.  Depression with Emotional Eating Behaviors We discussed behavior modification techniques today to help Sean [name] deal with his emotional eating and depression. He has agreed to increase Wellbutrin SR 200 mg qam #30 with no refills and follow up as directed.  Obesity Sean [name] is currently in the action stage of change. As such, his goal is to continue with weight loss efforts He has agreed to keep a food journal with 2,000 calories and 100+ grams of protein daily Sean [name] has been instructed to work up to a goal of 150 minutes of combined cardio and strengthening exercise per week for weight loss and overall health benefits. We discussed the following Behavioral Modification Strategies today: increasing lean protein intake, decreasing simple carbohydrates  and dealing with family or coworker sabotage  Sean [name] has agreed to follow up with our clinic in 2 weeks. He was informed of the importance of frequent follow up visits to maximize his success with intensive lifestyle modifications for his multiple health conditions.   OBESITY BEHAVIORAL INTERVENTION VISIT  Today's visit was # 10 out of 22.  Starting weight: 416 lbs Starting date: 09/05/16 Today's weight : 410 lbs Today's date: 03/15/2017 Total lbs lost to date: 6 (Patients must lose 7 lbs in the first 6 months to continue with counseling)   ASK: We discussed the diagnosis of obesity with Sean [name] today and Sean [name]  agreed to give us permission to discuss obesity behavioral modification therapy today.  ASSESS: Sean [name] has the  diagnosis of obesity and his BMI today is 57.21 Rhonin is in the action stage of change   ADVISE: Dejean was educated on the multiple health risks of obesity as well as the benefit of weight loss to improve his health. He was advised of the need for long term treatment and the importance of lifestyle modifications.  AGREE: Multiple dietary modification options and treatment options were discussed and  Calvyn agreed to the above obesity treatment plan.  I, [place] Crane, am acting as transcriptionist for Quillian Quince, MD  I have reviewed the above documentation for accuracy and completeness, and I agree with the above. -Quillian Quince, MD

## 2017-03-29 ENCOUNTER — Ambulatory Visit (INDEPENDENT_AMBULATORY_CARE_PROVIDER_SITE_OTHER): Payer: PRIVATE HEALTH INSURANCE | Admitting: Physician Assistant

## 2017-03-29 ENCOUNTER — Encounter (INDEPENDENT_AMBULATORY_CARE_PROVIDER_SITE_OTHER): Payer: Self-pay | Admitting: Physician Assistant

## 2017-03-29 VITALS — BP 151/94 | HR 90 | Temp 98.1°F | Ht 71.0 in | Wt >= 6400 oz

## 2017-03-29 DIAGNOSIS — Z9189 Other specified personal risk factors, not elsewhere classified: Secondary | ICD-10-CM

## 2017-03-29 DIAGNOSIS — I10 Essential (primary) hypertension: Secondary | ICD-10-CM | POA: Diagnosis not present

## 2017-03-29 DIAGNOSIS — Z6841 Body Mass Index (BMI) 40.0 and over, adult: Secondary | ICD-10-CM | POA: Diagnosis not present

## 2017-03-29 DIAGNOSIS — G4733 Obstructive sleep apnea (adult) (pediatric): Secondary | ICD-10-CM

## 2017-03-29 MED ORDER — LOSARTAN POTASSIUM-HCTZ 100-25 MG PO TABS: 1.0000 | ORAL_TABLET | Freq: Every day | ORAL | 0 refills | Status: DC

## 2017-03-29 NOTE — Progress Notes (Signed)
Office: [phone]  /  Fax: [phone]   HPI:   Chief Complaint: OBESITY Sean [name] is here to discuss his progress with his obesity treatment plan. He is on the  keep a food journal with 2,000 calories and 100+ grams of protein daily and is following his eating plan approximately 50 % of the time. He states he is exercising 0 minutes 0 times per week. Sean [name] continues to do well with weight loss. He has mainly been making smarter food choices and controlling his portions. He declines a structured meal plan and has not been journaling his meals. His weight is (!) 407 lb (184.6 kg) today and has had a weight loss of 3 pounds over a period of 2 weeks since his last visit. He has lost 9 lbs since starting treatment with [name].  Hypertension Sean [name] is a 42 y.o. male with hypertension. His blood pressure is elevated at 151/94 Sean [name] denies chest pain or shortness of breath on exertion. He has been out of his Losartan-HCTZ and has not gotten it refilled. He is working weight loss to help control his blood pressure with the goal of decreasing his risk of heart attack and stroke. Sean [name] blood pressure is not currently controlled.  At risk for cardiovascular disease Sean [name] is at a higher than average risk for cardiovascular disease due to obesity. He currently denies any chest pain.  Obstructive Sleep Apnea Sean [name] has recently been diagnosed with obstructive sleep apnea, but states due to costs, he has not been able to get a CPAP machine. He states he will go back to his pulmonologist as advised.  ALLERGIES: No Known Allergies  MEDICATIONS: Current Outpatient Medications on File Prior to Visit  Medication Sig Dispense Refill  . amLODipine (NORVASC) 10 MG tablet Take 1 tablet (10 mg total) by mouth daily. 90 tablet 3  . atorvastatin (LIPITOR) 20 MG tablet TAKE 1 TABLET BY MOUTH ONCE DAILY 90 tablet 0  . buPROPion (WELLBUTRIN SR) 200 MG 12 hr tablet Take 1 tablet (200 mg total) by mouth daily  at 12 noon. 30 tablet 0  . calcium-vitamin D 250-100 MG-UNIT tablet Take 1 tablet by mouth 2 (two) times daily.    . celecoxib (CELEBREX) 200 MG capsule Take 1 capsule (200 mg total) by mouth 2 (two) times daily. 30 capsule 5  . Glucosamine-Chondroitin-MSM (TRIPLE FLEX PO) Take by mouth.    Marland Kitchen ibuprofen (ADVIL,MOTRIN) 800 MG tablet Take 1 tablet (800 mg total) by mouth 3 (three) times daily. 21 tablet 0  . metFORMIN (GLUCOPHAGE) 500 MG tablet Take 1 tablet (500 mg total) daily by mouth. 30 tablet 0  . Turmeric 500 MG TABS Take by mouth.    . Vitamin D, Ergocalciferol, (DRISDOL) 50000 units CAPS capsule Take 1 capsule (50,000 Units total) by mouth every 7 (seven) days. 4 capsule 0   No current facility-administered medications on file prior to visit.     PAST MEDICAL HISTORY: Past Medical History:  Diagnosis Date  . Heartburn   . Hyperlipidemia   . Hypertension   . Sleep apnea   . SOB (shortness of breath) on exertion   . Swelling    feet and legs    PAST SURGICAL HISTORY: No past surgical history on file.  SOCIAL HISTORY: Social History   Tobacco Use  . Smoking status: Never Smoker  . Smokeless tobacco: Never Used  Substance Use Topics  . Alcohol use: Yes  . Drug use: No    FAMILY HISTORY:  Family History  Problem Relation Age of Onset  . Heart disease Father 4756       MI  . Hypertension Father   . Sudden death Father        Heart disease  . Hyperlipidemia Father   . Obesity Father   . Diabetes Mother        Borderline  . Hyperlipidemia Mother   . Hypertension Mother   . Depression Mother   . Obesity Mother   . Breast cancer Maternal Grandmother     ROS: Review of Systems  Constitutional: Positive for weight loss.  Respiratory: Negative for shortness of breath (on exertion).   Cardiovascular: Negative for chest pain.    PHYSICAL EXAM: Blood pressure (!) 151/94, pulse 90, temperature 98.1 F (36.7 C), temperature source Oral, height 5\' 11"  (1.803 m),  weight (!) 407 lb (184.6 kg), SpO2 98 %. Body mass index is 56.76 kg/m. Physical Exam  Constitutional: He is oriented to person, place, and time. He appears well-developed and well-nourished.  Cardiovascular: Normal rate.  Pulmonary/Chest: Effort normal.  Musculoskeletal: Normal range of motion.  Neurological: He is oriented to person, place, and time.  Skin: Skin is warm and dry.  Psychiatric: He has a normal mood and affect. His behavior is normal.  Vitals reviewed.   RECENT LABS AND TESTS: BMET    Component Value Date/Time   NA 139 02/15/2017 0834   K 4.0 02/15/2017 0834   CL 98 02/15/2017 0834   CO2 27 02/15/2017 0834   GLUCOSE 92 02/15/2017 0834   GLUCOSE 92 03/20/2016 1708   BUN 14 02/15/2017 0834   CREATININE 0.89 02/15/2017 0834   CALCIUM 9.6 02/15/2017 0834   GFRNONAA 105 02/15/2017 0834   GFRAA 122 02/15/2017 0834   Lab Results  Component Value Date   HGBA1C 5.5 02/15/2017   HGBA1C 5.7 (H) 09/05/2016   Lab Results  Component Value Date   INSULIN 36.9 (H) 02/15/2017   INSULIN 29.3 (H) 09/05/2016   CBC    Component Value Date/Time   WBC 9.4 09/05/2016 1010   WBC 11.3 (H) 04/22/2015 1615   RBC 5.35 09/05/2016 1010   RBC 5.49 04/22/2015 1615   HGB 15.2 09/05/2016 1010   HCT 44.5 09/05/2016 1010   PLT 249 09/05/2016 1010   MCV 83 09/05/2016 1010   MCH 28.4 09/05/2016 1010   MCH 28.1 10/20/2014 0700   MCHC 34.2 09/05/2016 1010   MCHC 34.1 04/22/2015 1615   RDW 14.6 09/05/2016 1010   LYMPHSABS 2.8 09/05/2016 1010   MONOABS 1.0 04/22/2015 1615   EOSABS 0.5 (H) 09/05/2016 1010   BASOSABS 0.1 09/05/2016 1010   Iron/TIBC/Ferritin/ %Sat No results found for: IRON, TIBC, FERRITIN, IRONPCTSAT Lipid Panel     Component Value Date/Time   CHOL 187 02/15/2017 0834   TRIG 100 02/15/2017 0834   HDL 37 (L) 02/15/2017 0834   CHOLHDL 4 03/20/2016 1708   VLDL 39.0 03/20/2016 1708   LDLCALC 130 (H) 02/15/2017 0834   LDLDIRECT 189.0 04/22/2015 1615   Hepatic  Function Panel     Component Value Date/Time   PROT 7.4 02/15/2017 0834   ALBUMIN 4.7 02/15/2017 0834   AST 23 02/15/2017 0834   ALT 33 02/15/2017 0834   ALKPHOS 64 02/15/2017 0834   BILITOT 0.7 02/15/2017 0834   BILIDIR 0.1 02/19/2014 1048      Component Value Date/Time   TSH 2.210 09/05/2016 1010   TSH 0.44 10/27/2013 1103   TSH 1.17 11/22/2010 1447  ASSESSMENT AND PLAN: Essential hypertension - Plan: losartan-hydrochlorothiazide (HYZAAR) 100-25 MG tablet  OSA (obstructive sleep apnea)  At risk for heart disease  Class 3 severe obesity with serious comorbidity and body mass index (BMI) of 50.0 to 59.9 in adult, unspecified obesity type (HCC)  PLAN:  Hypertension We discussed sodium restriction, working on healthy weight loss, and a regular exercise program as the means to achieve improved blood pressure control. Sean [name] agreed with this plan and agreed to follow up as directed. We will continue to monitor his blood pressure as well as his progress with the above lifestyle modifications. He agrees to continue Losartan-HCTZ 100-25 mg qd #30 with no refills and will watch for signs of hypotension as he continues his lifestyle modifications.  Cardiovascular risk counseling Sean [name] was given extended (15 minutes) coronary artery disease prevention counseling today. He is 42 y.o. male and has risk factors for heart disease including obesity and hypertension. We discussed intensive lifestyle modifications today with an emphasis on specific weight loss instructions and strategies. Pt was also informed of the importance of increasing exercise and decreasing saturated fats to help prevent heart disease.  Obstructive Sleep Apnea Sean [name] will follow up with his pulmonologist and will follow up with our clinic as directed.  Obesity Sean [name] is currently in the action stage of change. As such, his goal is to continue with weight loss efforts He has agreed to portion control better and make smarter  food choices, such as increase vegetables and decrease simple carbohydrates and continue weight loss. Sean [name] has been instructed to work up to a goal of 150 minutes of combined cardio and strengthening exercise per week for weight loss and overall health benefits. We discussed the following Behavioral Modification Strategies today: increasing lean protein intake and keeping healthy foods in the home  Sean [name] has agreed to follow up with our clinic in 2 weeks. He was informed of the importance of frequent follow up visits to maximize his success with intensive lifestyle modifications for his multiple health conditions.   OBESITY BEHAVIORAL INTERVENTION VISIT  Today's visit was # 11 out of 22.  Starting weight: 416 lbs Starting date: 09/05/16 Today's weight : 407 lbs Today's date: 03/29/2017 Total lbs lost to date: 9 (Patients must lose 7 lbs in the first 6 months to continue with counseling)   ASK: We discussed the diagnosis of obesity with Sean [name] today and Sean [name] agreed to give [name] permission to discuss obesity behavioral modification therapy today.  ASSESS: Sean [name] has the diagnosis of obesity and his BMI today is 56.79 Sean [name] is in the action stage of change   ADVISE: Sean [name] was educated on the multiple health risks of obesity as well as the benefit of weight loss to improve his health. He was advised of the need for long term treatment and the importance of lifestyle modifications.  AGREE: Multiple dietary modification options and treatment options were discussed and  Sean [name] agreed to the above obesity treatment plan.   Cristi Loron, am acting as transcriptionist for [employer], PA-C I, Illa Level [facility], have reviewed this note and agree with its content.

## 2017-04-02 ENCOUNTER — Telehealth: Payer: Self-pay | Admitting: Pulmonary Disease

## 2017-04-02 ENCOUNTER — Other Ambulatory Visit (INDEPENDENT_AMBULATORY_CARE_PROVIDER_SITE_OTHER): Payer: Self-pay

## 2017-04-02 DIAGNOSIS — I10 Essential (primary) hypertension: Secondary | ICD-10-CM

## 2017-04-02 MED ORDER — LOSARTAN POTASSIUM 100 MG PO TABS: 100.0000 mg | ORAL_TABLET | Freq: Every day | ORAL | 3 refills | Status: DC

## 2017-04-02 MED ORDER — HYDROCHLOROTHIAZIDE 25 MG PO TABS
25.0000 mg | ORAL_TABLET | Freq: Every day | ORAL | 3 refills | Status: DC
Start: 2017-04-02 — End: 2017-05-08

## 2017-04-02 NOTE — Telephone Encounter (Signed)
Spoke with patient. [facility]. He is not able to afford the cpap due to his insurance. He has a $2000 deductible.   He has a friend who has a CPAP machine that has been used only once. He wants to know if he could possible use his friend's machine and get his own supplies. He is aware that the machine will need to be re calibrated.   RA, please advise if you are ok with this. Thanks!

## 2017-04-03 NOTE — Telephone Encounter (Signed)
Although not ideal solution, we will work with him on this.  Recommended RX for auto cpap 5-15cm -please have him call back to let us know whether his machine will allow auto settings otherwise will ask DME to adjust

## 2017-04-03 NOTE — Telephone Encounter (Signed)
Spoke with patient, he is aware of RA's recs. Patient will contact [facility] tomorrow to see if they can service the machine that he has. Patient will check on [email or web address].com to see if he can get a mask from them cheaper. Advised him to call us if he needed anything. Patient verbalized understanding.    Nothing else needed at time of call.

## 2017-04-12 ENCOUNTER — Ambulatory Visit (INDEPENDENT_AMBULATORY_CARE_PROVIDER_SITE_OTHER): Payer: PRIVATE HEALTH INSURANCE | Admitting: Physician Assistant

## 2017-04-12 VITALS — BP 145/87 | HR 84 | Temp 98.4°F | Ht 71.0 in | Wt >= 6400 oz

## 2017-04-12 DIAGNOSIS — Z6841 Body Mass Index (BMI) 40.0 and over, adult: Secondary | ICD-10-CM

## 2017-04-12 DIAGNOSIS — E7849 Other hyperlipidemia: Secondary | ICD-10-CM | POA: Diagnosis not present

## 2017-04-12 DIAGNOSIS — I10 Essential (primary) hypertension: Secondary | ICD-10-CM

## 2017-04-12 DIAGNOSIS — F3289 Other specified depressive episodes: Secondary | ICD-10-CM

## 2017-04-12 DIAGNOSIS — Z9189 Other specified personal risk factors, not elsewhere classified: Secondary | ICD-10-CM

## 2017-04-12 DIAGNOSIS — E559 Vitamin D deficiency, unspecified: Secondary | ICD-10-CM

## 2017-04-12 MED ORDER — BUPROPION HCL ER (SR) 200 MG PO TB12: 200.0000 mg | ORAL_TABLET | Freq: Every day | ORAL | 0 refills | Status: DC

## 2017-04-12 MED ORDER — VITAMIN D (ERGOCALCIFEROL) 1.25 MG (50000 UNIT) PO CAPS: 50000.0000 [IU] | ORAL_CAPSULE | ORAL | 0 refills | Status: DC

## 2017-04-12 MED ORDER — ATORVASTATIN CALCIUM 20 MG PO TABS: 20.0000 mg | ORAL_TABLET | Freq: Every day | ORAL | 0 refills | Status: DC

## 2017-04-12 NOTE — Progress Notes (Signed)
Office: [phone]  /  Fax: [phone]   HPI:   Chief Complaint: OBESITY Sean [name] is here to discuss his progress with his obesity treatment plan. He is on the portion control better and make smarter food choices plan and is following his eating plan approximately 50 % of the time. He states he is doing cardio and light weights for 60 to 75 minutes 3 times per week. Sean [name] continues to do well with weight loss. He increases his lean protein intake, however, he does not journal all his meals. Sean [name] would like more snack ideas. His weight is (!) 403 lb (182.8 kg) today and has had a weight loss of 4 pounds over a period of 2 weeks since his last visit. He has lost 13 lbs since starting treatment with [name].  Vitamin D deficiency Sean [name] has a diagnosis of vitamin D deficiency. He is currently taking vit D and denies nausea, vomiting or muscle weakness.   Ref. Range 02/15/2017 08:34  Vitamin D, 25-Hydroxy Latest Ref Range: 30.0 - 100.0 ng/mL 41.6   Hyperlipidemia Sean [name] has hyperlipidemia and has been trying to improve his cholesterol levels with intensive lifestyle modification including a low saturated fat diet, exercise and weight loss. He denies any chest pain or claudication.  Hypertension Sean [name] is a 42 y.o. male with hypertension. His blood pressure is elevated at 145/87 He has a past history of obstructive sleep apnea and is not currently using CPAP, which is likely attributing to his elevated blood pressure. Sean [name] has follow up for fitting of his CPAP. He states he takes his prescribed blood pressure medications. Sean [name] denies chest pain or shortness of breath on exertion. He is working weight loss to help control his blood pressure with the goal of decreasing his risk of heart attack and stroke. Sean [name] blood pressure is not currently controlled.  At risk for cardiovascular disease Sean [name] is at a higher than average risk for cardiovascular disease due to obesity, hyperlipidemia and  hypertension. He currently denies any chest pain.  Depression with emotional eating behaviors Sean [name] is struggling with emotional eating and using food for comfort to the extent that it is negatively impacting his health. He often snacks when he is not hungry. Sean [name] sometimes feels he is out of control and then feels guilty that he made poor food choices. He has been working on behavior modification techniques to help reduce his emotional eating and has been somewhat successful. His mood is stable and he shows no sign of suicidal or homicidal ideations.  Depression screen [facility] 2/9 09/05/2016 12/02/2015  Decreased Interest 3 0  Down, Depressed, Hopeless 2 0  PHQ - 2 Score 5 0  Altered sleeping 3 -  Tired, decreased energy 3 -  Change in appetite 3 -  Feeling bad or failure about yourself  2 -  Moving slowly or fidgety/restless 0 -  Suicidal thoughts 0 -  PHQ-9 Score 16 -     ALLERGIES: No Known Allergies  MEDICATIONS: Current Outpatient Medications on File Prior to Visit  Medication Sig Dispense Refill  . amLODipine (NORVASC) 10 MG tablet Take 1 tablet (10 mg total) by mouth daily. 90 tablet 3  . atorvastatin (LIPITOR) 20 MG tablet TAKE 1 TABLET BY MOUTH ONCE DAILY 90 tablet 0  . buPROPion (WELLBUTRIN SR) 200 MG 12 hr tablet Take 1 tablet (200 mg total) by mouth daily at 12 noon. 30 tablet 0  . calcium-vitamin D 250-100 MG-UNIT tablet Take 1 tablet by mouth  2 (two) times daily.    . celecoxib (CELEBREX) 200 MG capsule Take 1 capsule (200 mg total) by mouth 2 (two) times daily. 30 capsule 5  . Glucosamine-Chondroitin-MSM (TRIPLE FLEX PO) Take by mouth.    . hydrochlorothiazide (HYDRODIURIL) 25 MG tablet Take 1 tablet (25 mg total) by mouth daily. 90 tablet 3  . ibuprofen (ADVIL,MOTRIN) 800 MG tablet Take 1 tablet (800 mg total) by mouth 3 (three) times daily. 21 tablet 0  . losartan (COZAAR) 100 MG tablet Take 1 tablet (100 mg total) by mouth daily. 90 tablet 3  . metFORMIN (GLUCOPHAGE)  500 MG tablet Take 1 tablet (500 mg total) daily by mouth. 30 tablet 0  . Turmeric 500 MG TABS Take by mouth.    . Vitamin D, Ergocalciferol, (DRISDOL) 50000 units CAPS capsule Take 1 capsule (50,000 Units total) by mouth every 7 (seven) days. 4 capsule 0   No current facility-administered medications on file prior to visit.     PAST MEDICAL HISTORY: Past Medical History:  Diagnosis Date  . Heartburn   . Hyperlipidemia   . Hypertension   . Sleep apnea   . SOB (shortness of breath) on exertion   . Swelling    feet and legs    PAST SURGICAL HISTORY: No past surgical history on file.  SOCIAL HISTORY: Social History   Tobacco Use  . Smoking status: Never Smoker  . Smokeless tobacco: Never Used  Substance Use Topics  . Alcohol use: Yes  . Drug use: No    FAMILY HISTORY: Family History  Problem Relation Age of Onset  . Heart disease Father 54       MI  . Hypertension Father   . Sudden death Father        Heart disease  . Hyperlipidemia Father   . Obesity Father   . Diabetes Mother        Borderline  . Hyperlipidemia Mother   . Hypertension Mother   . Depression Mother   . Obesity Mother   . Breast cancer Maternal Grandmother     ROS: Review of Systems  Constitutional: Positive for weight loss.  Respiratory: Negative for shortness of breath (on exertion).   Cardiovascular: Negative for chest pain and claudication.  Gastrointestinal: Negative for nausea and vomiting.  Musculoskeletal:       Negative for muscle weakness  Psychiatric/Behavioral: Positive for depression. Negative for suicidal ideas.    PHYSICAL EXAM: Blood pressure (!) 145/87, pulse 84, temperature 98.4 F (36.9 C), temperature source Oral, height 5\' 11"  (1.803 m), weight (!) 403 lb (182.8 kg), SpO2 97 %. Body mass index is 56.21 kg/m. Physical Exam  Constitutional: He is oriented to person, place, and time. He appears well-developed and well-nourished.  Cardiovascular: Normal rate.    Pulmonary/Chest: Effort normal.  Musculoskeletal: Normal range of motion.  Neurological: He is oriented to person, place, and time.  Skin: Skin is warm and dry.  Psychiatric: He has a normal mood and affect. His behavior is normal.  Vitals reviewed.   RECENT LABS AND TESTS: BMET    Component Value Date/Time   NA 139 02/15/2017 0834   K 4.0 02/15/2017 0834   CL 98 02/15/2017 0834   CO2 27 02/15/2017 0834   GLUCOSE 92 02/15/2017 0834   GLUCOSE 92 03/20/2016 1708   BUN 14 02/15/2017 0834   CREATININE 0.89 02/15/2017 0834   CALCIUM 9.6 02/15/2017 0834   GFRNONAA 105 02/15/2017 0834   GFRAA 122 02/15/2017 1610  Lab Results  Component Value Date   HGBA1C 5.5 02/15/2017   HGBA1C 5.7 (H) 09/05/2016   Lab Results  Component Value Date   INSULIN 36.9 (H) 02/15/2017   INSULIN 29.3 (H) 09/05/2016   CBC    Component Value Date/Time   WBC 9.4 09/05/2016 1010   WBC 11.3 (H) 04/22/2015 1615   RBC 5.35 09/05/2016 1010   RBC 5.49 04/22/2015 1615   HGB 15.2 09/05/2016 1010   HCT 44.5 09/05/2016 1010   PLT 249 09/05/2016 1010   MCV 83 09/05/2016 1010   MCH 28.4 09/05/2016 1010   MCH 28.1 10/20/2014 0700   MCHC 34.2 09/05/2016 1010   MCHC 34.1 04/22/2015 1615   RDW 14.6 09/05/2016 1010   LYMPHSABS 2.8 09/05/2016 1010   MONOABS 1.0 04/22/2015 1615   EOSABS 0.5 (H) 09/05/2016 1010   BASOSABS 0.1 09/05/2016 1010   Iron/TIBC/Ferritin/ %Sat No results found for: IRON, TIBC, FERRITIN, IRONPCTSAT Lipid Panel     Component Value Date/Time   CHOL 187 02/15/2017 0834   TRIG 100 02/15/2017 0834   HDL 37 (L) 02/15/2017 0834   CHOLHDL 4 03/20/2016 1708   VLDL 39.0 03/20/2016 1708   LDLCALC 130 (H) 02/15/2017 0834   LDLDIRECT 189.0 04/22/2015 1615   Hepatic Function Panel     Component Value Date/Time   PROT 7.4 02/15/2017 0834   ALBUMIN 4.7 02/15/2017 0834   AST 23 02/15/2017 0834   ALT 33 02/15/2017 0834   ALKPHOS 64 02/15/2017 0834   BILITOT 0.7 02/15/2017 0834    BILIDIR 0.1 02/19/2014 1048      Component Value Date/Time   TSH 2.210 09/05/2016 1010   TSH 0.44 10/27/2013 1103   TSH 1.17 11/22/2010 1447    ASSESSMENT AND PLAN: Other hyperlipidemia - Plan: atorvastatin (LIPITOR) 20 MG tablet  Vitamin D deficiency - Plan: Vitamin D, Ergocalciferol, (DRISDOL) 50000 units CAPS capsule  Essential hypertension  Other depression - with emotional eating - Plan: buPROPion (WELLBUTRIN SR) 200 MG 12 hr tablet  At risk for heart disease  Class 3 severe obesity with serious comorbidity and body mass index (BMI) of 50.0 to 59.9 in adult, unspecified obesity type (HCC)  PLAN:  Vitamin D Deficiency Sean [name] was informed that low vitamin D levels contributes to fatigue and are associated with obesity, breast, and colon cancer. He agrees to continue to take prescription Vit D @50 ,000 IU every week #4 with no refills and will follow up for routine testing of vitamin D, at least 2-3 times per year. He was informed of the risk of over-replacement of vitamin D and agrees to not increase his dose unless he discusses this with [name] first. Noble agrees to follow up with our clinic in 2 weeks.  Hyperlipidemia Sean [name] was informed of the American Heart Association Guidelines emphasizing intensive lifestyle modifications as the first line treatment for hyperlipidemia. We discussed many lifestyle modifications today in depth, and Sean [name] will continue to work on decreasing saturated fats such as fatty red meat, butter and many fried foods. He will also increase vegetables and lean protein in his diet and continue to work on exercise and weight loss efforts. He agrees to continue Lipitor 20 mg qd #30 with no refills and follow up as directed.  Hypertension We discussed sodium restriction, working on healthy weight loss, and a regular exercise program as the means to achieve improved blood pressure control. Sean [name] agreed with this plan and agreed to follow up as directed. We will continue to  monitor  his blood pressure as well as his progress with the above lifestyle modifications. Sean [name] is advised on following up for his CPAP. He will continue his blood pressure medications as prescribed and will watch for signs of hypotension as he continues his lifestyle modifications.  Cardiovascular risk counseling Sean [name] was given extended (15 minutes) coronary artery disease prevention counseling today. He is 42 y.o. male and has risk factors for heart disease including obesity, hyperlipidemia and hypertension We discussed intensive lifestyle modifications today with an emphasis on specific weight loss instructions and strategies. Pt was also informed of the importance of increasing exercise and decreasing saturated fats to help prevent heart disease.  Depression with Emotional Eating Behaviors We discussed behavior modification techniques today to help Sean [name] deal with his emotional eating and depression. He has agreed to continue Wellbutrin SR 200 mg qd #30 with no refills and follow up as directed.   Obesity Sean [name] is currently in the action stage of change. As such, his goal is to continue with weight loss efforts He has agreed to portion control better and make smarter food choices, such as increase vegetables and decrease simple carbohydrates daily Sean [name] has been instructed to work up to a goal of 150 minutes of combined cardio and strengthening exercise per week for weight loss and overall health benefits. We discussed the following Behavioral Modification Strategies today: better snacking choices and keep a strict food journal  Sean [name] has agreed to follow up with our clinic in 2 weeks. He was informed of the importance of frequent follow up visits to maximize his success with intensive lifestyle modifications for his multiple health conditions.   OBESITY BEHAVIORAL INTERVENTION VISIT  Today's visit was # 12 out of 22.  Starting weight: 416 lbs Starting date: 09/05/16 Today's weight : 403  lbs Today's date: 04/12/2017 Total lbs lost to date: 13 (Patients must lose 7 lbs in the first 6 months to continue with counseling)   ASK: We discussed the diagnosis of obesity with Sean GraffEvan D Hobdy today and Johnchristopher agreed to give us permission to discuss obesity behavioral modification therapy today.  ASSESS: Sean [name] has the diagnosis of obesity and his BMI today is 56.23 Sean [name] is in the action stage of change   ADVISE: Sean [name] was educated on the multiple health risks of obesity as well as the benefit of weight loss to improve his health. He was advised of the need for long term treatment and the importance of lifestyle modifications.  AGREE: Multiple dietary modification options and treatment options were discussed and  Yavuz agreed to the above obesity treatment plan.   Cristi LoronI, Joanne Murray, am acting as transcriptionist for [employer] Osman, PA-C I, Illa LevelSahar Osman [facility], have reviewed this note and agree with its content.

## 2017-04-25 ENCOUNTER — Ambulatory Visit (INDEPENDENT_AMBULATORY_CARE_PROVIDER_SITE_OTHER): Payer: PRIVATE HEALTH INSURANCE | Admitting: Physician Assistant

## 2017-04-25 ENCOUNTER — Encounter (INDEPENDENT_AMBULATORY_CARE_PROVIDER_SITE_OTHER): Payer: Self-pay | Admitting: Physician Assistant

## 2017-04-25 VITALS — BP 123/80 | HR 88 | Temp 98.3°F | Ht 71.0 in | Wt >= 6400 oz

## 2017-04-25 DIAGNOSIS — R7303 Prediabetes: Secondary | ICD-10-CM | POA: Diagnosis not present

## 2017-04-25 DIAGNOSIS — Z6841 Body Mass Index (BMI) 40.0 and over, adult: Secondary | ICD-10-CM | POA: Diagnosis not present

## 2017-04-25 DIAGNOSIS — E559 Vitamin D deficiency, unspecified: Secondary | ICD-10-CM | POA: Diagnosis not present

## 2017-04-25 DIAGNOSIS — Z9189 Other specified personal risk factors, not elsewhere classified: Secondary | ICD-10-CM | POA: Diagnosis not present

## 2017-04-25 MED ORDER — METFORMIN HCL 500 MG PO TABS: 500.0000 mg | ORAL_TABLET | Freq: Two times a day (BID) | ORAL | 0 refills | Status: DC

## 2017-04-25 NOTE — Progress Notes (Addendum)
Office: [phone]  /  Fax: [phone]   HPI:   Chief Complaint: OBESITY Sean [name] is here to discuss his progress with his obesity treatment plan. He is on the portion control better and make smarter food choices plan and is following his eating plan approximately 50 % of the time. He states he is exercising 0 minutes 0 times per week. Sean [name] has been sick with a gastrointestinal virus and has not been as mindful of his eating. He also states he is drinking more liquid calories. His weight is (!) 403 lb (182.8 kg) today and has maintained weight over a period of 2 weeks since his last visit. He has lost 13 lbs since starting treatment with us.  Pre-Diabetes Sean [name] has a diagnosis of pre-diabetes based on his elevated Hgb A1c and was informed this puts him at greater risk of developing diabetes. He is taking metformin currently and continues to work on diet and exercise to decrease risk of diabetes. He admits polyphagia and denies nausea or hypoglycemia.  At risk for diabetes Sean [name] is at higher than average risk for developing diabetes due to his obesity and pre-diabetes. He currently denies polyuria or polydipsia.  Vitamin D deficiency Sean [name] has a diagnosis of vitamin D deficiency. He is currently taking vit D and denies nausea, vomiting or muscle weakness.  ALLERGIES: No Known Allergies  MEDICATIONS: Current Outpatient Medications on File Prior to Visit  Medication Sig Dispense Refill  . amLODipine (NORVASC) 10 MG tablet Take 1 tablet (10 mg total) by mouth daily. 90 tablet 3  . atorvastatin (LIPITOR) 20 MG tablet Take 1 tablet (20 mg total) by mouth daily. 30 tablet 0  . buPROPion (WELLBUTRIN SR) 200 MG 12 hr tablet Take 1 tablet (200 mg total) by mouth daily at 12 noon. 30 tablet 0  . calcium-vitamin D 250-100 MG-UNIT tablet Take 1 tablet by mouth 2 (two) times daily.    . celecoxib (CELEBREX) 200 MG capsule Take 1 capsule (200 mg total) by mouth 2 (two) times daily. 30 capsule 5  .  Glucosamine-Chondroitin-MSM (TRIPLE FLEX PO) Take by mouth.    . hydrochlorothiazide (HYDRODIURIL) 25 MG tablet Take 1 tablet (25 mg total) by mouth daily. 90 tablet 3  . ibuprofen (ADVIL,MOTRIN) 800 MG tablet Take 1 tablet (800 mg total) by mouth 3 (three) times daily. 21 tablet 0  . losartan (COZAAR) 100 MG tablet Take 1 tablet (100 mg total) by mouth daily. 90 tablet 3  . Turmeric 500 MG TABS Take by mouth.    . Vitamin D, Ergocalciferol, (DRISDOL) 50000 units CAPS capsule Take 1 capsule (50,000 Units total) by mouth every 7 (seven) days. 4 capsule 0   No current facility-administered medications on file prior to visit.     PAST MEDICAL HISTORY: Past Medical History:  Diagnosis Date  . Heartburn   . Hyperlipidemia   . Hypertension   . Sleep apnea   . SOB (shortness of breath) on exertion   . Swelling    feet and legs    PAST SURGICAL HISTORY: No past surgical history on file.  SOCIAL HISTORY: Social History   Tobacco Use  . Smoking status: Never Smoker  . Smokeless tobacco: Never Used  Substance Use Topics  . Alcohol use: Yes  . Drug use: No    FAMILY HISTORY: Family History  Problem Relation Age of Onset  . Heart disease Father 7756       MI  . Hypertension Father   . Sudden death Father  Heart disease  . Hyperlipidemia Father   . Obesity Father   . Diabetes Mother        Borderline  . Hyperlipidemia Mother   . Hypertension Mother   . Depression Mother   . Obesity Mother   . Breast cancer Maternal Grandmother     ROS: Review of Systems  Constitutional: Negative for weight loss.  Gastrointestinal: Negative for nausea and vomiting.  Genitourinary: Negative for frequency.  Musculoskeletal:       Negative for muscle weakness  Endo/Heme/Allergies: Negative for polydipsia.       Positive for polyphagia Negative for hypoglycemia    PHYSICAL EXAM: Blood pressure 123/80, pulse 88, temperature 98.3 F (36.8 C), temperature source Oral, height 5\' 11"   (1.803 m), weight (!) 403 lb (182.8 kg), SpO2 98 %. Body mass index is 56.21 kg/m. Physical Exam  Constitutional: He is oriented to person, place, and time. He appears well-developed and well-nourished.  Cardiovascular: Normal rate.  Pulmonary/Chest: Effort normal.  Musculoskeletal: Normal range of motion.  Neurological: He is oriented to person, place, and time.  Skin: Skin is warm and dry.  Psychiatric: He has a normal mood and affect. His behavior is normal.  Vitals reviewed.   RECENT LABS AND TESTS: BMET    Component Value Date/Time   NA 139 02/15/2017 0834   K 4.0 02/15/2017 0834   CL 98 02/15/2017 0834   CO2 27 02/15/2017 0834   GLUCOSE 92 02/15/2017 0834   GLUCOSE 92 03/20/2016 1708   BUN 14 02/15/2017 0834   CREATININE 0.89 02/15/2017 0834   CALCIUM 9.6 02/15/2017 0834   GFRNONAA 105 02/15/2017 0834   GFRAA 122 02/15/2017 0834   Lab Results  Component Value Date   HGBA1C 5.5 02/15/2017   HGBA1C 5.7 (H) 09/05/2016   Lab Results  Component Value Date   INSULIN 36.9 (H) 02/15/2017   INSULIN 29.3 (H) 09/05/2016   CBC    Component Value Date/Time   WBC 9.4 09/05/2016 1010   WBC 11.3 (H) 04/22/2015 1615   RBC 5.35 09/05/2016 1010   RBC 5.49 04/22/2015 1615   HGB 15.2 09/05/2016 1010   HCT 44.5 09/05/2016 1010   PLT 249 09/05/2016 1010   MCV 83 09/05/2016 1010   MCH 28.4 09/05/2016 1010   MCH 28.1 10/20/2014 0700   MCHC 34.2 09/05/2016 1010   MCHC 34.1 04/22/2015 1615   RDW 14.6 09/05/2016 1010   LYMPHSABS 2.8 09/05/2016 1010   MONOABS 1.0 04/22/2015 1615   EOSABS 0.5 (H) 09/05/2016 1010   BASOSABS 0.1 09/05/2016 1010   Iron/TIBC/Ferritin/ %Sat No results found for: IRON, TIBC, FERRITIN, IRONPCTSAT Lipid Panel     Component Value Date/Time   CHOL 187 02/15/2017 0834   TRIG 100 02/15/2017 0834   HDL 37 (L) 02/15/2017 0834   CHOLHDL 4 03/20/2016 1708   VLDL 39.0 03/20/2016 1708   LDLCALC 130 (H) 02/15/2017 0834   LDLDIRECT 189.0 04/22/2015 1615     Hepatic Function Panel     Component Value Date/Time   PROT 7.4 02/15/2017 0834   ALBUMIN 4.7 02/15/2017 0834   AST 23 02/15/2017 0834   ALT 33 02/15/2017 0834   ALKPHOS 64 02/15/2017 0834   BILITOT 0.7 02/15/2017 0834   BILIDIR 0.1 02/19/2014 1048      Component Value Date/Time   TSH 2.210 09/05/2016 1010   TSH 0.44 10/27/2013 1103   TSH 1.17 11/22/2010 1447     Ref. Range 02/15/2017 08:34  Vitamin D, 25-Hydroxy Latest Ref  Range: 30.0 - 100.0 ng/mL 41.6    ASSESSMENT AND PLAN: Prediabetes - Plan: metFORMIN (GLUCOPHAGE) 500 MG tablet  Vitamin D deficiency  At risk for diabetes mellitus  Class 3 severe obesity with serious comorbidity and body mass index (BMI) of 50.0 to 59.9 in adult, unspecified obesity type (HCC)  PLAN:  Pre-Diabetes Darryle will continue to work on weight loss, exercise, and decreasing simple carbohydrates in his diet to help decrease the risk of diabetes. We dicussed metformin including benefits and risks. He was informed that eating too many simple carbohydrates or too many calories at one sitting increases the likelihood of GI side effects. Makoto agrees to increase metformin to 500 mg twice daily #60 with no refills and follow up with [name] as directed to monitor his progress.  Diabetes risk counseling Philo was given extended (15 minutes) diabetes prevention counseling today. He is 42 y.o. male and has risk factors for diabetes including obesity and pre-diabetes. We discussed intensive lifestyle modifications today with an emphasis on weight loss as well as increasing exercise and decreasing simple carbohydrates in his diet.  Vitamin D Deficiency Cason was informed that low vitamin D levels contributes to fatigue and are associated with obesity, breast, and colon cancer. He agrees to continue to take prescription Vit D @50 ,000 IU every week and will follow up for routine testing of vitamin D, at least 2-3 times per year. He was informed of the risk of  over-replacement of vitamin D and agrees to not increase his dose unless he discusses this with [name] first.  Obesity Brien is currently in the action stage of change. As such, his goal is to continue with weight loss efforts He has agreed to portion control better and make smarter food choices, such as increase vegetables and decrease simple carbohydrates  Jaxxon has been instructed to work up to a goal of 150 minutes of combined cardio and strengthening exercise per week for weight loss and overall health benefits. We discussed the following Behavioral Modification Strategies today: increasing lean protein intake and decrease liquid calories  Vinny has agreed to follow up with our clinic in 2 weeks. He was informed of the importance of frequent follow up visits to maximize his success with intensive lifestyle modifications for his multiple health conditions.    OBESITY BEHAVIORAL INTERVENTION VISIT  Today's visit was # 13 out of 22.  Starting weight: 416 lbs Starting date: 09/05/16 Today's weight : 403 lbs Today's date: 04/26/2017 Total lbs lost to date: 13 (Patients must lose 7 lbs in the first 6 months to continue with counseling)   ASK: We discussed the diagnosis of obesity with Aurea Graff today and Mannie agreed to give [name] permission to discuss obesity behavioral modification therapy today.  ASSESS: Lucio has the diagnosis of obesity and his BMI today is 56.23 Bayler is in the action stage of change   ADVISE: Candace was educated on the multiple health risks of obesity as well as the benefit of weight loss to improve his health. He was advised of the need for long term treatment and the importance of lifestyle modifications.  AGREE: Multiple dietary modification options and treatment options were discussed and  Ahkeem agreed to the above obesity treatment plan.   Cristi Loron, am acting as transcriptionist for [employer], PA-C I, [employer] [facility], have reviewed this note and agree  with its content

## 2017-05-08 ENCOUNTER — Encounter (INDEPENDENT_AMBULATORY_CARE_PROVIDER_SITE_OTHER): Payer: Self-pay | Admitting: Physician Assistant

## 2017-05-08 ENCOUNTER — Telehealth: Payer: Self-pay | Admitting: Pulmonary Disease

## 2017-05-08 ENCOUNTER — Ambulatory Visit (INDEPENDENT_AMBULATORY_CARE_PROVIDER_SITE_OTHER): Payer: PRIVATE HEALTH INSURANCE | Admitting: Physician Assistant

## 2017-05-08 VITALS — BP 149/91 | HR 85 | Temp 98.4°F | Ht 71.0 in | Wt >= 6400 oz

## 2017-05-08 DIAGNOSIS — I10 Essential (primary) hypertension: Secondary | ICD-10-CM

## 2017-05-08 DIAGNOSIS — G4733 Obstructive sleep apnea (adult) (pediatric): Secondary | ICD-10-CM

## 2017-05-08 DIAGNOSIS — Z6841 Body Mass Index (BMI) 40.0 and over, adult: Secondary | ICD-10-CM

## 2017-05-08 DIAGNOSIS — Z9189 Other specified personal risk factors, not elsewhere classified: Secondary | ICD-10-CM | POA: Diagnosis not present

## 2017-05-08 MED ORDER — AMLODIPINE BESYLATE 10 MG PO TABS: 10.0000 mg | ORAL_TABLET | Freq: Every day | ORAL | 0 refills | Status: DC

## 2017-05-08 MED ORDER — LOSARTAN POTASSIUM 100 MG PO TABS: 100.0000 mg | ORAL_TABLET | Freq: Every day | ORAL | 0 refills | Status: DC

## 2017-05-08 MED ORDER — HYDROCHLOROTHIAZIDE 25 MG PO TABS: 25.0000 mg | ORAL_TABLET | Freq: Every day | ORAL | 0 refills | Status: DC

## 2017-05-08 NOTE — Telephone Encounter (Signed)
Set at 12 cm.  Please ask him to make sure that he has a card in the machine. Adam for office visit in 2 weeks with NP and check download to adjust pressure upwards if needed

## 2017-05-08 NOTE — Telephone Encounter (Signed)
cpap order was placed on 03/07/17 for auto pressure 5-15cm. Pt states he is using cpap machine from family friend.  Pt stated that borrowed machine can not be set to auto pressure.  Pt would like to know what pressure cpap machine can be set at.  RA please advise. Thanks.

## 2017-05-08 NOTE — Telephone Encounter (Signed)
Called pt and advised message from the provider. Pt understood and verbalized understanding. Nothing further is needed.   Order sent to [facility] with CPAP settings. Advised to call back to schedule an appt after set up.

## 2017-05-09 NOTE — Progress Notes (Signed)
Office: [phone]  /  Fax: [phone]   HPI:   Chief Complaint: OBESITY Sean [name] is here to discuss his progress with his obesity treatment plan. He is on the portion control better and make smarter food choices plan and is following his eating plan approximately 50 % of the time. He states he is doing cardio for 30 minutes 2 times per week. Sean [name] continues to do well with weight loss. Sean [name] continues to have challenges staying within his caloric range. His weight is (!) 402 lb (182.3 kg) today and has had a weight loss of 1 pound over a period of 2 weeks since his last visit. He has lost 14 lbs since starting treatment with [name].  Hypertension Sean [name] is a 42 y.o. male with hypertension. Sean [name] denies chest pain or shortness of breath on exertion. He is working weight loss to help control his blood pressure with the goal of decreasing his risk of heart attack and stroke. Sean [name] blood pressure is not currently controlled.  Obstructive Sleep Apnea not on CPAP Sean [name] is working on getting his CPAP re-calibrated and will be purchasing additional equipment. Admits to insomnia and fatigue.  At risk for cardiovascular disease Sean [name] is at a higher than average risk for cardiovascular disease due to obesity, hypertension and sleep apnea. He currently denies any chest pain.  ALLERGIES: No Known Allergies  MEDICATIONS: Current Outpatient Medications on File Prior to Visit  Medication Sig Dispense Refill  . atorvastatin (LIPITOR) 20 MG tablet Take 1 tablet (20 mg total) by mouth daily. 30 tablet 0  . buPROPion (WELLBUTRIN SR) 200 MG 12 hr tablet Take 1 tablet (200 mg total) by mouth daily at 12 noon. 30 tablet 0  . calcium-vitamin D 250-100 MG-UNIT tablet Take 1 tablet by mouth 2 (two) times daily.    . celecoxib (CELEBREX) 200 MG capsule Take 1 capsule (200 mg total) by mouth 2 (two) times daily. 30 capsule 5  . Glucosamine-Chondroitin-MSM (TRIPLE FLEX PO) Take by mouth.    Marland Kitchen ibuprofen  (ADVIL,MOTRIN) 800 MG tablet Take 1 tablet (800 mg total) by mouth 3 (three) times daily. 21 tablet 0  . metFORMIN (GLUCOPHAGE) 500 MG tablet Take 1 tablet (500 mg total) by mouth 2 (two) times daily with a meal. 60 tablet 0  . Turmeric 500 MG TABS Take by mouth.    . Vitamin D, Ergocalciferol, (DRISDOL) 50000 units CAPS capsule Take 1 capsule (50,000 Units total) by mouth every 7 (seven) days. 4 capsule 0   No current facility-administered medications on file prior to visit.     PAST MEDICAL HISTORY: Past Medical History:  Diagnosis Date  . Heartburn   . Hyperlipidemia   . Hypertension   . Sleep apnea   . SOB (shortness of breath) on exertion   . Swelling    feet and legs    PAST SURGICAL HISTORY: No past surgical history on file.  SOCIAL HISTORY: Social History   Tobacco Use  . Smoking status: Never Smoker  . Smokeless tobacco: Never Used  Substance Use Topics  . Alcohol use: Yes  . Drug use: No    FAMILY HISTORY: Family History  Problem Relation Age of Onset  . Heart disease Father 70       MI  . Hypertension Father   . Sudden death Father        Heart disease  . Hyperlipidemia Father   . Obesity Father   . Diabetes Mother  Borderline  . Hyperlipidemia Mother   . Hypertension Mother   . Depression Mother   . Obesity Mother   . Breast cancer Maternal Grandmother     ROS: Review of Systems  Constitutional: Positive for malaise/fatigue and weight loss.  Respiratory: Negative for shortness of breath (on exertion).   Cardiovascular: Negative for chest pain.  Psychiatric/Behavioral: The patient has insomnia.     PHYSICAL EXAM: Blood pressure (!) 149/91, pulse 85, temperature 98.4 F (36.9 C), temperature source Oral, height 5\' 11"  (1.803 m), weight (!) 402 lb (182.3 kg), SpO2 98 %. Body mass index is 56.07 kg/m. Physical Exam  Constitutional: He is oriented to person, place, and time. He appears well-developed and well-nourished.  Cardiovascular:  Normal rate.  Pulmonary/Chest: Effort normal.  Musculoskeletal: Normal range of motion.  Neurological: He is oriented to person, place, and time.  Skin: Skin is warm and dry.  Psychiatric: He has a normal mood and affect. His behavior is normal.  Vitals reviewed.   RECENT LABS AND TESTS: BMET    Component Value Date/Time   NA 139 02/15/2017 0834   K 4.0 02/15/2017 0834   CL 98 02/15/2017 0834   CO2 27 02/15/2017 0834   GLUCOSE 92 02/15/2017 0834   GLUCOSE 92 03/20/2016 1708   BUN 14 02/15/2017 0834   CREATININE 0.89 02/15/2017 0834   CALCIUM 9.6 02/15/2017 0834   GFRNONAA 105 02/15/2017 0834   GFRAA 122 02/15/2017 0834   Lab Results  Component Value Date   HGBA1C 5.5 02/15/2017   HGBA1C 5.7 (H) 09/05/2016   Lab Results  Component Value Date   INSULIN 36.9 (H) 02/15/2017   INSULIN 29.3 (H) 09/05/2016   CBC    Component Value Date/Time   WBC 9.4 09/05/2016 1010   WBC 11.3 (H) 04/22/2015 1615   RBC 5.35 09/05/2016 1010   RBC 5.49 04/22/2015 1615   HGB 15.2 09/05/2016 1010   HCT 44.5 09/05/2016 1010   PLT 249 09/05/2016 1010   MCV 83 09/05/2016 1010   MCH 28.4 09/05/2016 1010   MCH 28.1 10/20/2014 0700   MCHC 34.2 09/05/2016 1010   MCHC 34.1 04/22/2015 1615   RDW 14.6 09/05/2016 1010   LYMPHSABS 2.8 09/05/2016 1010   MONOABS 1.0 04/22/2015 1615   EOSABS 0.5 (H) 09/05/2016 1010   BASOSABS 0.1 09/05/2016 1010   Iron/TIBC/Ferritin/ %Sat No results found for: IRON, TIBC, FERRITIN, IRONPCTSAT Lipid Panel     Component Value Date/Time   CHOL 187 02/15/2017 0834   TRIG 100 02/15/2017 0834   HDL 37 (L) 02/15/2017 0834   CHOLHDL 4 03/20/2016 1708   VLDL 39.0 03/20/2016 1708   LDLCALC 130 (H) 02/15/2017 0834   LDLDIRECT 189.0 04/22/2015 1615   Hepatic Function Panel     Component Value Date/Time   PROT 7.4 02/15/2017 0834   ALBUMIN 4.7 02/15/2017 0834   AST 23 02/15/2017 0834   ALT 33 02/15/2017 0834   ALKPHOS 64 02/15/2017 0834   BILITOT 0.7 02/15/2017  0834   BILIDIR 0.1 02/19/2014 1048      Component Value Date/Time   TSH 2.210 09/05/2016 1010   TSH 0.44 10/27/2013 1103   TSH 1.17 11/22/2010 1447   Results for Sean GraffBEAMAN, Nirav D (MRN [id number]) as of 05/09/2017 08:02  Ref. Range 02/15/2017 08:34  Vitamin D, 25-Hydroxy Latest Ref Range: 30.0 - 100.0 ng/mL 41.6   ASSESSMENT AND PLAN: Essential hypertension - Plan: amLODipine (NORVASC) 10 MG tablet, losartan (COZAAR) 100 MG tablet, hydrochlorothiazide (HYDRODIURIL) 25 MG  tablet  OSA (obstructive sleep apnea)  At risk for heart disease  Class 3 severe obesity with serious comorbidity and body mass index (BMI) of 50.0 to 59.9 in adult, unspecified obesity type (HCC)  PLAN:  Hypertension We discussed sodium restriction, working on healthy weight loss, and a regular exercise program as the means to achieve improved blood pressure control. Sean [name] agreed with this plan and agreed to follow up as directed. We will continue to monitor his blood pressure as well as his progress with the above lifestyle modifications. He agrees to continue amlodipine 10 mg qd #30 with no refills, losartan 100 mg qd #30 with no refills and HCTZ 25 mg qd #30 with no refills and will watch for signs of hypotension as he continues his lifestyle modifications.  Obstructive Sleep Apnea not on CPAP Sean [name] is to follow up to get CPAP. Sean [name] agrees to follow up with our clinic as directed.  Cardiovascular risk counseling Merel was given extended (50 minutes) coronary artery disease prevention counseling today. He is 42 y.o. male and has risk factors for heart disease including obesity, hypertension and sleep apnea. We discussed intensive lifestyle modifications today with an emphasis on specific weight loss instructions and strategies. Pt was also informed of the importance of increasing exercise and decreasing saturated fats to help prevent heart disease.  Obesity Lejuan is currently in the action stage of change. As such, his  goal is to continue with weight loss efforts He has agreed to portion control better and make smarter food choices, such as increase vegetables and decrease simple carbohydrates  Erdem has been instructed to work up to a goal of 150 minutes of combined cardio and strengthening exercise per week for weight loss and overall health benefits. We discussed the following Behavioral Modification Strategies today: increasing lean protein intake and work on meal planning and easy cooking plans  Truong has agreed to follow up with our clinic in 2 weeks. He was informed of the importance of frequent follow up visits to maximize his success with intensive lifestyle modifications for his multiple health conditions.   OBESITY BEHAVIORAL INTERVENTION VISIT  Today's visit was # 14 out of 22.  Starting weight: 416 lbs Starting date: 09/05/16 Today's weight : 402 lbs Today's date: 05/08/2017 Total lbs lost to date: 14 (Patients must lose 7 lbs in the first 6 months to continue with counseling)   ASK: We discussed the diagnosis of obesity with Sean [name] today and Kaisei agreed to give [name] permission to discuss obesity behavioral modification therapy today.  ASSESS: Tellis has the diagnosis of obesity and his BMI today is 56.09 Gates is in the action stage of change   ADVISE: Heyward was educated on the multiple health risks of obesity as well as the benefit of weight loss to improve his health. He was advised of the need for long term treatment and the importance of lifestyle modifications.  AGREE: Multiple dietary modification options and treatment options were discussed and  Chet agreed to the above obesity treatment plan.   Cristi Loron, am acting as transcriptionist for [employer], PA-C I, [employer] [facility], have reviewed this note and agree with its content

## 2017-05-24 ENCOUNTER — Ambulatory Visit (INDEPENDENT_AMBULATORY_CARE_PROVIDER_SITE_OTHER): Payer: PRIVATE HEALTH INSURANCE | Admitting: Physician Assistant

## 2017-05-28 ENCOUNTER — Other Ambulatory Visit (INDEPENDENT_AMBULATORY_CARE_PROVIDER_SITE_OTHER): Payer: Self-pay | Admitting: Physician Assistant

## 2017-05-28 DIAGNOSIS — F3289 Other specified depressive episodes: Secondary | ICD-10-CM

## 2017-05-28 DIAGNOSIS — R7303 Prediabetes: Secondary | ICD-10-CM

## 2017-05-28 DIAGNOSIS — E559 Vitamin D deficiency, unspecified: Secondary | ICD-10-CM

## 2017-05-28 DIAGNOSIS — E7849 Other hyperlipidemia: Secondary | ICD-10-CM

## 2017-05-31 ENCOUNTER — Telehealth (INDEPENDENT_AMBULATORY_CARE_PROVIDER_SITE_OTHER): Payer: Self-pay | Admitting: Physician Assistant

## 2017-05-31 NOTE — Telephone Encounter (Signed)
Requested metformin vitamin B super dose, apatite suppressant , [facility] [address] [place]

## 2017-06-04 NOTE — Telephone Encounter (Signed)
Sean [name] was out of office on 05/31/17. Pt has appt on 06/05/17 we will refill at office visit.    R

## 2017-06-05 ENCOUNTER — Ambulatory Visit (INDEPENDENT_AMBULATORY_CARE_PROVIDER_SITE_OTHER): Payer: PRIVATE HEALTH INSURANCE | Admitting: Physician Assistant

## 2017-06-05 VITALS — BP 163/101 | HR 89 | Temp 98.9°F | Ht 71.0 in | Wt >= 6400 oz

## 2017-06-05 DIAGNOSIS — E559 Vitamin D deficiency, unspecified: Secondary | ICD-10-CM | POA: Diagnosis not present

## 2017-06-05 DIAGNOSIS — Z9189 Other specified personal risk factors, not elsewhere classified: Secondary | ICD-10-CM | POA: Diagnosis not present

## 2017-06-05 DIAGNOSIS — E7849 Other hyperlipidemia: Secondary | ICD-10-CM

## 2017-06-05 DIAGNOSIS — R7303 Prediabetes: Secondary | ICD-10-CM

## 2017-06-05 DIAGNOSIS — I10 Essential (primary) hypertension: Secondary | ICD-10-CM

## 2017-06-05 DIAGNOSIS — F3289 Other specified depressive episodes: Secondary | ICD-10-CM

## 2017-06-05 DIAGNOSIS — Z6841 Body Mass Index (BMI) 40.0 and over, adult: Secondary | ICD-10-CM

## 2017-06-05 MED ORDER — METFORMIN HCL 500 MG PO TABS: 500.0000 mg | ORAL_TABLET | Freq: Two times a day (BID) | ORAL | 0 refills | Status: DC

## 2017-06-05 MED ORDER — VITAMIN D (ERGOCALCIFEROL) 1.25 MG (50000 UNIT) PO CAPS: 50000.0000 [IU] | ORAL_CAPSULE | ORAL | 0 refills | Status: DC

## 2017-06-05 MED ORDER — LOSARTAN POTASSIUM 100 MG PO TABS: 100.0000 mg | ORAL_TABLET | Freq: Every day | ORAL | 0 refills | Status: DC

## 2017-06-05 MED ORDER — ATORVASTATIN CALCIUM 20 MG PO TABS: 20.0000 mg | ORAL_TABLET | Freq: Every day | ORAL | 0 refills | Status: DC

## 2017-06-05 MED ORDER — BUPROPION HCL ER (SR) 200 MG PO TB12: 200.0000 mg | ORAL_TABLET | Freq: Every day | ORAL | 0 refills | Status: DC

## 2017-06-05 MED ORDER — HYDROCHLOROTHIAZIDE 25 MG PO TABS: 25.0000 mg | ORAL_TABLET | Freq: Every day | ORAL | 0 refills | Status: DC

## 2017-06-05 MED ORDER — AMLODIPINE BESYLATE 10 MG PO TABS: 10.0000 mg | ORAL_TABLET | Freq: Every day | ORAL | 0 refills | Status: DC

## 2017-06-06 NOTE — Progress Notes (Signed)
Office: [phone]  /  Fax: [phone]   HPI:   Chief Complaint: OBESITY Sean [name] is here to discuss his progress with his obesity treatment plan. He is on the portion control better and make smarter food choices plan and is following his eating plan approximately 10 to 20 % of the time. He states he is exercising 0 minutes 0 times per week. Sean [name] maintained his weight. He continues to struggle with preplanning his meals. He is making smarter food choices and is controlling his portions. He states he will be joining a fitness center to help him exercise more. His weight is (!) 402 lb (182.3 kg) today and has maintained weight over a period of 4 weeks since his last visit. He has lost 14 lbs since starting treatment with [name].  Vitamin D deficiency Sean [name] has a diagnosis of vitamin D deficiency. He is currently taking vit D and denies nausea, vomiting or muscle weakness.  Pre-Diabetes Sean [name] has a diagnosis of pre-diabetes based on his elevated Hgb A1c and was informed this puts him at greater risk of developing diabetes. He is taking metformin currently and continues to work on diet and exercise to decrease risk of diabetes. He denies nausea, polyphagia or hypoglycemia.  Hypertension Sean [name] is a 42 y.o. male with hypertension. His blood pressure is elevated and repeat blood pressure was at 169/103. He states he has been out of his medication. Sean [name] denies chest pain or shortness of breath on exertion. Sean [name] denies any headache, blurred vision or weakness. He is working weight loss to help control his blood pressure with the goal of decreasing his risk of heart attack and stroke. Sean [name] blood pressure is not currently controlled.  Hyperlipidemia Sean [name] has hyperlipidemia and has been trying to improve his cholesterol levels with intensive lifestyle modification including a low saturated fat diet, exercise and weight loss. He denies any chest pain or claudication.   Depression with  emotional eating behaviors Sean [name] is struggling with emotional eating and using food for comfort to the extent that it is negatively impacting his health. He often snacks when he is not hungry. Sean [name] sometimes feels he is out of control and then feels guilty that he made poor food choices. He has been working on behavior modification techniques to help reduce his emotional eating and has been somewhat successful. His mood is stable and he shows no sign of suicidal or homicidal ideations.  Depression screen [facility] 2/9 09/05/2016 12/02/2015  Decreased Interest 3 0  Down, Depressed, Hopeless 2 0  PHQ - 2 Score 5 0  Altered sleeping 3 -  Tired, decreased energy 3 -  Change in appetite 3 -  Feeling bad or failure about yourself  2 -  Moving slowly or fidgety/restless 0 -  Suicidal thoughts 0 -  PHQ-9 Score 16 -     ALLERGIES: No Known Allergies  MEDICATIONS: Current Outpatient Medications on File Prior to Visit  Medication Sig Dispense Refill  . calcium-vitamin D 250-100 MG-UNIT tablet Take 1 tablet by mouth 2 (two) times daily.    . celecoxib (CELEBREX) 200 MG capsule Take 1 capsule (200 mg total) by mouth 2 (two) times daily. 30 capsule 5  . Glucosamine-Chondroitin-MSM (TRIPLE FLEX PO) Take by mouth.    Marland Kitchen ibuprofen (ADVIL,MOTRIN) 800 MG tablet Take 1 tablet (800 mg total) by mouth 3 (three) times daily. 21 tablet 0  . Turmeric 500 MG TABS Take by mouth.     No current facility-administered medications on  file prior to visit.     PAST MEDICAL HISTORY: Past Medical History:  Diagnosis Date  . Heartburn   . Hyperlipidemia   . Hypertension   . Sleep apnea   . SOB (shortness of breath) on exertion   . Swelling    feet and legs    PAST SURGICAL HISTORY: No past surgical history on file.  SOCIAL HISTORY: Social History   Tobacco Use  . Smoking status: Never Smoker  . Smokeless tobacco: Never Used  Substance Use Topics  . Alcohol use: Yes  . Drug use: No    FAMILY  HISTORY: Family History  Problem Relation Age of Onset  . Heart disease Father 52       MI  . Hypertension Father   . Sudden death Father        Heart disease  . Hyperlipidemia Father   . Obesity Father   . Diabetes Mother        Borderline  . Hyperlipidemia Mother   . Hypertension Mother   . Depression Mother   . Obesity Mother   . Breast cancer Maternal Grandmother     ROS: Review of Systems  Constitutional: Negative for weight loss.  Respiratory: Negative for shortness of breath (on exertion).   Cardiovascular: Negative for chest pain and claudication.  Gastrointestinal: Negative for nausea and vomiting.  Musculoskeletal:       Negative for muscle weakness  Endo/Heme/Allergies:       Negative for polyphagia Negative for hypoglycemia  Psychiatric/Behavioral: Positive for depression. Negative for suicidal ideas.    PHYSICAL EXAM: Blood pressure (!) 163/101, pulse 89, temperature 98.9 F (37.2 C), temperature source Oral, height  (1.803 m), weight (!) 402 lb (182.3 kg), SpO2 98 %. Body mass index is 56.07 kg/m. Physical Exam  Constitutional: He is oriented to person, place, and time. He appears well-developed and well-nourished.  Cardiovascular: Normal rate.  Pulmonary/Chest: Effort normal.  Musculoskeletal: Normal range of motion.  Neurological: He is oriented to person, place, and time.  Skin: Skin is warm and dry.  Psychiatric: He has a normal mood and affect. His behavior is normal.  Vitals reviewed.   RECENT LABS AND TESTS: BMET    Component Value Date/Time   NA 139 02/15/2017 0834   K 4.0 02/15/2017 0834   CL 98 02/15/2017 0834   CO2 27 02/15/2017 0834   GLUCOSE 92 02/15/2017 0834   GLUCOSE 92 03/20/2016 1708   BUN 14 02/15/2017 0834   CREATININE 0.89 02/15/2017 0834   CALCIUM 9.6 02/15/2017 0834   GFRNONAA 105 02/15/2017 0834   GFRAA 122 02/15/2017 0834   Lab Results  Component Value Date   HGBA1C 5.5 02/15/2017   HGBA1C 5.7 (H)  09/05/2016   Lab Results  Component Value Date   INSULIN 36.9 (H) 02/15/2017   INSULIN 29.3 (H) 09/05/2016   CBC    Component Value Date/Time   WBC 9.4 09/05/2016 1010   WBC 11.3 (H) 04/22/2015 1615   RBC 5.35 09/05/2016 1010   RBC 5.49 04/22/2015 1615   HGB 15.2 09/05/2016 1010   HCT 44.5 09/05/2016 1010   PLT 249 09/05/2016 1010   MCV 83 09/05/2016 1010   MCH 28.4 09/05/2016 1010   MCH 28.1 10/20/2014 0700   MCHC 34.2 09/05/2016 1010   MCHC 34.1 04/22/2015 1615   RDW 14.6 09/05/2016 1010   LYMPHSABS 2.8 09/05/2016 1010   MONOABS 1.0 04/22/2015 1615   EOSABS 0.5 (H) 09/05/2016 1010   BASOSABS 0.1  09/05/2016 1010   Iron/TIBC/Ferritin/ %Sat No results found for: IRON, TIBC, FERRITIN, IRONPCTSAT Lipid Panel     Component Value Date/Time   CHOL 187 02/15/2017 0834   TRIG 100 02/15/2017 0834   HDL 37 (L) 02/15/2017 0834   CHOLHDL 4 03/20/2016 1708   VLDL 39.0 03/20/2016 1708   LDLCALC 130 (H) 02/15/2017 0834   LDLDIRECT 189.0 04/22/2015 1615   Hepatic Function Panel     Component Value Date/Time   PROT 7.4 02/15/2017 0834   ALBUMIN 4.7 02/15/2017 0834   AST 23 02/15/2017 0834   ALT 33 02/15/2017 0834   ALKPHOS 64 02/15/2017 0834   BILITOT 0.7 02/15/2017 0834   BILIDIR 0.1 02/19/2014 1048      Component Value Date/Time   TSH 2.210 09/05/2016 1010   TSH 0.44 10/27/2013 1103   TSH 1.17 11/22/2010 1447   Results for SHELIA, MAGALLON (MRN [id number]) as of 06/06/2017 08:07  Ref. Range 02/15/2017 08:34  Vitamin D, 25-Hydroxy Latest Ref Range: 30.0 - 100.0 ng/mL 41.6   ASSESSMENT AND PLAN: Essential hypertension - Plan: amLODipine (NORVASC) 10 MG tablet, losartan (COZAAR) 100 MG tablet, hydrochlorothiazide (HYDRODIURIL) 25 MG tablet  Vitamin D deficiency - Plan: Vitamin D, Ergocalciferol, (DRISDOL) 50000 units CAPS capsule  Prediabetes - Plan: metFORMIN (GLUCOPHAGE) 500 MG tablet  Other hyperlipidemia - Plan: atorvastatin (LIPITOR) 20 MG tablet  Other depression  - with emotional eating - Plan: buPROPion (WELLBUTRIN SR) 200 MG 12 hr tablet  Class 3 severe obesity with serious comorbidity and body mass index (BMI) of 50.0 to 59.9 in adult, unspecified obesity type (HCC)  PLAN:  Vitamin D Deficiency Kentavius was informed that low vitamin D levels contributes to fatigue and are associated with obesity, breast, and colon cancer. He agrees to continue to take prescription Vit D ,000 IU every week #4 with no refills and will follow up for routine testing of vitamin D, at least 2-3 times per year. He was informed of the risk of over-replacement of vitamin D and agrees to not increase his dose unless he discusses this with [name] first. Macguire agrees to follow up with our clinic in 3 weeks.  Pre-Diabetes Keaten will continue to work on weight loss, exercise, and decreasing simple carbohydrates in his diet to help decrease the risk of diabetes. We dicussed metformin including benefits and risks. He was informed that eating too many simple carbohydrates or too many calories at one sitting increases the likelihood of GI side effects. Josey agreed to continue metformin 500 mg BID #60 with no refills and follow up with [name] as directed to monitor his progress.  Hypertension We discussed sodium restriction, working on healthy weight loss, and a regular exercise program as the means to achieve improved blood pressure control. [name] agreed with this plan and agreed to follow up as directed. We will continue to monitor his blood pressure as well as his progress with the above lifestyle modifications. He agrees to continue Amlodipine 10 mg qd #30 with no refills, Losartan 100 mg qd #30 with no refills and HCTZ 25 mg qd #30 with no refills and will watch for signs of hypotension as he continues his lifestyle modifications.  Hyperlipidemia Phuoc was informed of the American Heart Association Guidelines emphasizing intensive lifestyle modifications as the first line treatment for  hyperlipidemia. We discussed many lifestyle modifications today in depth, and Enrique will continue to work on decreasing saturated fats such as fatty red meat, butter and many fried foods. He will also increase  vegetables and lean protein in his diet and continue to work on exercise and weight loss efforts. Anthany agrees to continue Lipitor 20 mg qd #30 with no refills and follow up as directed.  Depression with Emotional Eating Behaviors We discussed behavior modification techniques today to help Doug deal with his emotional eating and depression. He has agreed to continue Wellbutrin SR 200 mg qd #30 with no refills and follow up as directed.  Obesity Daesean is currently in the action stage of change. As such, his goal is to continue with weight loss efforts He has agreed to portion control better and make smarter food choices, such as increase vegetables and decrease simple carbohydrates  Camille has been instructed to work up to a goal of 150 minutes of combined cardio and strengthening exercise per week for weight loss and overall health benefits. We discussed the following Behavioral Modification Strategies today: celebration eating strategies and planning for success  Jaspreet has agreed to follow up with our clinic in 3 weeks. He was informed of the importance of frequent follow up visits to maximize his success with intensive lifestyle modifications for his multiple health conditions.   OBESITY BEHAVIORAL INTERVENTION VISIT  Today's visit was # 15 out of 22.  Starting weight: 416 lbs Starting date: 09/05/16 Today's weight : 402 lbs Today's date: 06/05/2017 Total lbs lost to date: 14 (Patients must lose 7 lbs in the first 6 months to continue with counseling)   ASK: We discussed the diagnosis of obesity with Sean [name] today and Zackary agreed to give [name] permission to discuss obesity behavioral modification therapy today.  ASSESS: Burman has the diagnosis of obesity and his BMI today is  56.09 Alexsander is in the action stage of change   ADVISE: Gable was educated on the multiple health risks of obesity as well as the benefit of weight loss to improve his health. He was advised of the need for long term treatment and the importance of lifestyle modifications.  AGREE: Multiple dietary modification options and treatment options were discussed and  Jashad agreed to the above obesity treatment plan.   Cristi Loron, am acting as transcriptionist for [employer], PA-C I, [facility], have reviewed this note and agree with its content

## 2017-06-26 ENCOUNTER — Ambulatory Visit (INDEPENDENT_AMBULATORY_CARE_PROVIDER_SITE_OTHER): Payer: PRIVATE HEALTH INSURANCE | Admitting: Physician Assistant

## 2017-06-26 VITALS — BP 158/94 | HR 82 | Temp 97.7°F | Ht 71.0 in | Wt >= 6400 oz

## 2017-06-26 DIAGNOSIS — F3289 Other specified depressive episodes: Secondary | ICD-10-CM | POA: Diagnosis not present

## 2017-06-26 DIAGNOSIS — I10 Essential (primary) hypertension: Secondary | ICD-10-CM | POA: Diagnosis not present

## 2017-06-26 DIAGNOSIS — Z6841 Body Mass Index (BMI) 40.0 and over, adult: Secondary | ICD-10-CM

## 2017-06-26 MED ORDER — CHLORTHALIDONE 25 MG PO TABS: 25.0000 mg | ORAL_TABLET | Freq: Every day | ORAL | 0 refills | Status: DC

## 2017-06-26 MED ORDER — NALTREXONE-BUPROPION HCL ER 8-90 MG PO TB12: 2.0000 | ORAL_TABLET | Freq: Two times a day (BID) | ORAL | 0 refills | Status: DC

## 2017-06-27 ENCOUNTER — Encounter (INDEPENDENT_AMBULATORY_CARE_PROVIDER_SITE_OTHER): Payer: Self-pay

## 2017-06-27 MED ORDER — LIRAGLUTIDE -WEIGHT MANAGEMENT 18 MG/3ML ~~LOC~~ SOPN: 3.0000 mg | PEN_INJECTOR | Freq: Every day | SUBCUTANEOUS | 0 refills | Status: DC

## 2017-06-27 NOTE — Progress Notes (Signed)
Office: [phone]  /  Fax: [phone]   HPI:   Chief Complaint: OBESITY Sean [name] is here to discuss his progress with his obesity treatment plan. He is on the portion control better and make smarter food choices plan and is following his eating plan approximately 25 % of the time. He states he is exercising 0 minutes 0 times per week. [name] maintained his weight. He was on vacation and got off track with his eating on some occasions. His weight is (!) 402 lb (182.3 kg) today and he has maintained weight over a period of 3 weeks since his last visit. He has lost 14 lbs since starting treatment with [name].  Hypertension Sean [name] is a 42 y.o. male with hypertension. His blood pressure is elevated today at 158/94. He is currently taking HCTZ, amlodipine and losartan. Sean [name] denies chest pain or shortness of breath on exertion. He is working weight loss to help control his blood pressure with the goal of decreasing his risk of heart attack and stroke. [name] blood pressure is not currently controlled.  Depression with emotional eating behaviors Sean [name] is struggling with emotional eating and using food for comfort to the extent that it is negatively impacting his health. He often snacks when he is not hungry. Sean [name] sometimes feels he is out of control and then feels guilty that he made poor food choices. He has been working on behavior modification techniques to help reduce his emotional eating and has been somewhat successful. His mood is stable and he shows no sign of suicidal or homicidal ideations.  Depression screen Sean [name] 2/9 09/05/2016 12/02/2015  Decreased Interest 3 0  Down, Depressed, Hopeless 2 0  PHQ - 2 Score 5 0  Altered sleeping 3 -  Tired, decreased energy 3 -  Change in appetite 3 -  Feeling bad or failure about yourself  2 -  Moving slowly or fidgety/restless 0 -  Suicidal thoughts 0 -  PHQ-9 Score 16 -     ALLERGIES: No Known Allergies  MEDICATIONS: Current  Outpatient Medications on File Prior to Visit  Medication Sig Dispense Refill  . amLODipine (NORVASC) 10 MG tablet Take 1 tablet (10 mg total) by mouth daily. 30 tablet 0  . atorvastatin (LIPITOR) 20 MG tablet Take 1 tablet (20 mg total) by mouth daily. 30 tablet 0  . calcium-vitamin D 250-100 MG-UNIT tablet Take 1 tablet by mouth 2 (two) times daily.    . celecoxib (CELEBREX) 200 MG capsule Take 1 capsule (200 mg total) by mouth 2 (two) times daily. 30 capsule 5  . Glucosamine-Chondroitin-MSM (TRIPLE FLEX PO) Take by mouth.    Marland Kitchen ibuprofen (ADVIL,MOTRIN) 800 MG tablet Take 1 tablet (800 mg total) by mouth 3 (three) times daily. 21 tablet 0  . losartan (COZAAR) 100 MG tablet Take 1 tablet (100 mg total) by mouth daily. 30 tablet 0  . metFORMIN (GLUCOPHAGE) 500 MG tablet Take 1 tablet (500 mg total) by mouth 2 (two) times daily with a meal. 60 tablet 0  . Turmeric 500 MG TABS Take by mouth.    . Vitamin D, Ergocalciferol, (DRISDOL) 50000 units CAPS capsule Take 1 capsule (50,000 Units total) by mouth every 7 (seven) days. 4 capsule 0   No current facility-administered medications on file prior to visit.     PAST MEDICAL HISTORY: Past Medical History:  Diagnosis Date  . Heartburn   . Hyperlipidemia   . Hypertension   . Sleep apnea   . SOB (  shortness of breath) on exertion   . Swelling    feet and legs    PAST SURGICAL HISTORY: No past surgical history on file.  SOCIAL HISTORY: Social History   Tobacco Use  . Smoking status: Never Smoker  . Smokeless tobacco: Never Used  Substance Use Topics  . Alcohol use: Yes  . Drug use: No    FAMILY HISTORY: Family History  Problem Relation Age of Onset  . Heart disease Father 24       MI  . Hypertension Father   . Sudden death Father        Heart disease  . Hyperlipidemia Father   . Obesity Father   . Diabetes Mother        Borderline  . Hyperlipidemia Mother   . Hypertension Mother   . Depression Mother   . Obesity Mother     . Breast cancer Maternal Grandmother     ROS: Review of Systems  Constitutional: Negative for weight loss.  Respiratory: Negative for shortness of breath (on exertion).   Cardiovascular: Negative for chest pain.  Psychiatric/Behavioral: Positive for depression. Negative for suicidal ideas.    PHYSICAL EXAM: Blood pressure (!) 158/94, pulse 82, temperature 97.7 F (36.5 C), height  (1.803 m), weight (!) 402 lb (182.3 kg), SpO2 97 %. Body mass index is 56.07 kg/m. Physical Exam  Constitutional: He is oriented to person, place, and time. He appears well-developed and well-nourished.  Cardiovascular: Normal rate.  Pulmonary/Chest: Effort normal.  Musculoskeletal: Normal range of motion.  Neurological: He is oriented to person, place, and time.  Skin: Skin is warm and dry.  Psychiatric: He has a normal mood and affect. His behavior is normal.  Vitals reviewed.   RECENT LABS AND TESTS: BMET    Component Value Date/Time   NA 139 02/15/2017 0834   K 4.0 02/15/2017 0834   CL 98 02/15/2017 0834   CO2 27 02/15/2017 0834   GLUCOSE 92 02/15/2017 0834   GLUCOSE 92 03/20/2016 1708   BUN 14 02/15/2017 0834   CREATININE 0.89 02/15/2017 0834   CALCIUM 9.6 02/15/2017 0834   GFRNONAA 105 02/15/2017 0834   GFRAA 122 02/15/2017 0834   Lab Results  Component Value Date   HGBA1C 5.5 02/15/2017   HGBA1C 5.7 (H) 09/05/2016   Lab Results  Component Value Date   INSULIN 36.9 (H) 02/15/2017   INSULIN 29.3 (H) 09/05/2016   CBC    Component Value Date/Time   WBC 9.4 09/05/2016 1010   WBC 11.3 (H) 04/22/2015 1615   RBC 5.35 09/05/2016 1010   RBC 5.49 04/22/2015 1615   HGB 15.2 09/05/2016 1010   HCT 44.5 09/05/2016 1010   PLT 249 09/05/2016 1010   MCV 83 09/05/2016 1010   MCH 28.4 09/05/2016 1010   MCH 28.1 10/20/2014 0700   MCHC 34.2 09/05/2016 1010   MCHC 34.1 04/22/2015 1615   RDW 14.6 09/05/2016 1010   LYMPHSABS 2.8 09/05/2016 1010   MONOABS 1.0 04/22/2015 1615    EOSABS 0.5 (H) 09/05/2016 1010   BASOSABS 0.1 09/05/2016 1010   Iron/TIBC/Ferritin/ %Sat No results found for: IRON, TIBC, FERRITIN, IRONPCTSAT Lipid Panel     Component Value Date/Time   CHOL 187 02/15/2017 0834   TRIG 100 02/15/2017 0834   HDL 37 (L) 02/15/2017 0834   CHOLHDL 4 03/20/2016 1708   VLDL 39.0 03/20/2016 1708   LDLCALC 130 (H) 02/15/2017 0834   LDLDIRECT 189.0 04/22/2015 1615   Hepatic Function Panel  Component Value Date/Time   PROT 7.4 02/15/2017 0834   ALBUMIN 4.7 02/15/2017 0834   AST 23 02/15/2017 0834   ALT 33 02/15/2017 0834   ALKPHOS 64 02/15/2017 0834   BILITOT 0.7 02/15/2017 0834   BILIDIR 0.1 02/19/2014 1048      Component Value Date/Time   TSH 2.210 09/05/2016 1010   TSH 0.44 10/27/2013 1103   TSH 1.17 11/22/2010 1447   Results for Sean [name], Sean [name] (MRN [id number]) as of 06/27/2017 09:10  Ref. Range 02/15/2017 08:34  Vitamin D, 25-Hydroxy Latest Ref Range: 30.0 - 100.0 ng/mL 41.6   ASSESSMENT AND PLAN: Essential hypertension - Plan: chlorthalidone (HYGROTON) 25 MG tablet  Other depression - with emotional eating  Class 3 severe obesity with serious comorbidity and body mass index (BMI) of 50.0 to 59.9 in adult, unspecified obesity type (HCC) - Plan: Naltrexone-buPROPion HCl ER (CONTRAVE) 8-90 MG TB12  PLAN:  Hypertension We discussed sodium restriction, working on healthy weight loss, and a regular exercise program as the means to achieve improved blood pressure control. [name] agreed with this plan and agreed to follow up as directed. We will continue to monitor his blood pressure as well as his progress with the above lifestyle modifications. Aking agrees to stop HCTZ and start chlorthalidone 25 mg qd #30 with no refills and continue amlodipine and losartan. Daylyn will watch for signs of hypotension as he continues his lifestyle modifications.  Depression with Emotional Eating Behaviors We discussed behavior modification techniques today to help  Sean [name] deal with his emotional eating and depression. He has agreed to discontinue Wellbutrin SR 200 mg qd and follow up as directed.  Obesity Sean [name] is currently in the action stage of change. As such, his goal is to continue with weight loss efforts He has agreed to portion control better and make smarter food choices, such as increase vegetables and decrease simple carbohydrates  Sean [name] has been instructed to work up to a goal of 150 minutes of combined cardio and strengthening exercise per week for weight loss and overall health benefits. We discussed the following Behavioral Modification Strategies today: increasing lean protein intake and work on meal planning and easy cooking plans  We discussed various medication options to help Sean [name] with his weight loss efforts and we both agreed to start Saxenda 3 mg once daily #5 pens with no refills. Contrave is not covered by insurance. He has not been on phentermine in the past. Sean [name] has been on wellbutrin.   Sean [name] has agreed to follow up with our clinic in 3 weeks. He was informed of the importance of frequent follow up visits to maximize his success with intensive lifestyle modifications for his multiple health conditions.   OBESITY BEHAVIORAL INTERVENTION VISIT  Today's visit was # 16 out of 22.  Starting weight: 416 lbs Starting date: 09/05/16 Today's weight : 402 lbs  Today's date: 06/26/2017 Total lbs lost to date: 14 (Patients must lose 7 lbs in the first 6 months to continue with counseling)   ASK: We discussed the diagnosis of obesity with Sean [name] today and Sean [name] agreed to give [name] permission to discuss obesity behavioral modification therapy today.  ASSESS: Sean [name] has the diagnosis of obesity and his BMI today is 56.09 Sean [name] is in the action stage of change   ADVISE: Sean [name] was educated on the multiple health risks of obesity as well as the benefit of weight loss to improve his health. He was advised of the need for long term treatment  and the importance of lifestyle modifications.  AGREE: Multiple dietary modification options and treatment options were discussed and  Sean [name] agreed to the above obesity treatment plan.   Cristi Loron, am acting as transcriptionist for [employer], PA-C I, [place], have reviewed this note and agree with its content

## 2017-07-17 ENCOUNTER — Ambulatory Visit (INDEPENDENT_AMBULATORY_CARE_PROVIDER_SITE_OTHER): Payer: PRIVATE HEALTH INSURANCE | Admitting: Family Medicine

## 2017-07-17 VITALS — BP 120/73 | HR 98 | Temp 98.8°F | Ht 71.0 in | Wt 395.0 lb

## 2017-07-17 DIAGNOSIS — Z9189 Other specified personal risk factors, not elsewhere classified: Secondary | ICD-10-CM | POA: Diagnosis not present

## 2017-07-17 DIAGNOSIS — F3289 Other specified depressive episodes: Secondary | ICD-10-CM

## 2017-07-17 DIAGNOSIS — Z6841 Body Mass Index (BMI) 40.0 and over, adult: Secondary | ICD-10-CM

## 2017-07-17 MED ORDER — BUPROPION HCL ER (SR) 200 MG PO TB12
200.0000 mg | ORAL_TABLET | Freq: Every day | ORAL | 0 refills | Status: DC
Start: 2017-07-17 — End: 2017-08-22

## 2017-07-17 NOTE — Progress Notes (Signed)
Office: [phone]  /  Fax: [phone]   HPI:   Chief Complaint: OBESITY Sean [name] is here to discuss his progress with his obesity treatment plan. He is on the portion control better and make smarter food choices plan and is following his eating plan approximately 25 % of the time. He states he is gardening for 25 minutes 6 to 7 times per week. Sean [name] has done well with weight loss in the last 3 weeks, but mostly is due to stress. He is not following a formal diet plan, but he has been more active. His weight is (!) 395 lb (179.2 kg) today and has had a weight loss of 7 pounds over a period of 3 weeks since his last visit. He has lost 21 lbs since starting treatment with [name].  Depression with emotional eating behaviors Dayna was on Wellbutrin in the past and felt it helped some, but not enough. His Contrave was denied by insurance. Sean [name] struggles with emotional eating and using food for comfort to the extent that it is negatively impacting his health. He often snacks when he is not hungry. Sean [name] sometimes feels he is out of control and then feels guilty that he made poor food choices. He has been working on behavior modification techniques to help reduce his emotional eating and has been somewhat successful. He shows no sign of suicidal or homicidal ideations.  At risk for cardiovascular disease Sean [name] is at a higher than average risk for cardiovascular disease due to obesity. He currently denies any chest pain.  Depression screen [facility] 2/9 09/05/2016 12/02/2015  Decreased Interest 3 0  Down, Depressed, Hopeless 2 0  PHQ - 2 Score 5 0  Altered sleeping 3 -  Tired, decreased energy 3 -  Change in appetite 3 -  Feeling bad or failure about yourself  2 -  Moving slowly or fidgety/restless 0 -  Suicidal thoughts 0 -  PHQ-9 Score 16 -      ALLERGIES: No Known Allergies  MEDICATIONS: Current Outpatient Medications on File Prior to Visit  Medication Sig Dispense Refill  . amLODipine (NORVASC)  10 MG tablet Take 1 tablet (10 mg total) by mouth daily. 30 tablet 0  . atorvastatin (LIPITOR) 20 MG tablet Take 1 tablet (20 mg total) by mouth daily. 30 tablet 0  . calcium-vitamin [name] 250-100 MG-UNIT tablet Take 1 tablet by mouth 2 (two) times daily.    . celecoxib (CELEBREX) 200 MG capsule Take 1 capsule (200 mg total) by mouth 2 (two) times daily. 30 capsule 5  . chlorthalidone (HYGROTON) 25 MG tablet Take 1 tablet (25 mg total) by mouth daily. 30 tablet 0  . Glucosamine-Chondroitin-MSM (TRIPLE FLEX PO) Take by mouth.    Marland Kitchen ibuprofen (ADVIL,MOTRIN) 800 MG tablet Take 1 tablet (800 mg total) by mouth 3 (three) times daily. 21 tablet 0  . losartan (COZAAR) 100 MG tablet Take 1 tablet (100 mg total) by mouth daily. 30 tablet 0  . metFORMIN (GLUCOPHAGE) 500 MG tablet Take 1 tablet (500 mg total) by mouth 2 (two) times daily with a meal. 60 tablet 0  . Turmeric 500 MG TABS Take by mouth.    . Vitamin [name], Ergocalciferol, (DRISDOL) 50000 units CAPS capsule Take 1 capsule (50,000 Units total) by mouth every 7 (seven) days. 4 capsule 0   No current facility-administered medications on file prior to visit.     PAST MEDICAL HISTORY: Past Medical History:  Diagnosis Date  . Heartburn   . Hyperlipidemia   .  Hypertension   . Sleep apnea   . SOB (shortness of breath) on exertion   . Swelling    feet and legs    PAST SURGICAL HISTORY: No past surgical history on file.  SOCIAL HISTORY: Social History   Tobacco Use  . Smoking status: Never Smoker  . Smokeless tobacco: Never Used  Substance Use Topics  . Alcohol use: Yes  . Drug use: No    FAMILY HISTORY: Family History  Problem Relation Age of Onset  . Heart disease Father 58       MI  . Hypertension Father   . Sudden death Father        Heart disease  . Hyperlipidemia Father   . Obesity Father   . Diabetes Mother        Borderline  . Hyperlipidemia Mother   . Hypertension Mother   . Depression Mother   . Obesity Mother   .  Breast cancer Maternal Grandmother     ROS: Review of Systems  Constitutional: Positive for weight loss.  Cardiovascular: Negative for chest pain.  Psychiatric/Behavioral: Positive for depression. Negative for suicidal ideas.    PHYSICAL EXAM: Blood pressure 120/73, pulse 98, temperature 98.8 F (37.1 C), temperature source Oral, height 5\' 11"  (1.803 m), weight (!) 395 lb (179.2 kg), SpO2 98 %. Body mass index is 55.09 kg/m. Physical Exam  Constitutional: He is oriented to person, place, and time. He appears well-developed and well-nourished.  Cardiovascular: Normal rate.  Pulmonary/Chest: Effort normal.  Musculoskeletal: Normal range of motion.  Neurological: He is oriented to person, place, and time.  Skin: Skin is warm and dry.  Psychiatric: He has a normal mood and affect. His behavior is normal.  Vitals reviewed.   RECENT LABS AND TESTS: BMET    Component Value Date/Time   NA 139 02/15/2017 0834   K 4.0 02/15/2017 0834   CL 98 02/15/2017 0834   CO2 27 02/15/2017 0834   GLUCOSE 92 02/15/2017 0834   GLUCOSE 92 03/20/2016 1708   BUN 14 02/15/2017 0834   CREATININE 0.89 02/15/2017 0834   CALCIUM 9.6 02/15/2017 0834   GFRNONAA 105 02/15/2017 0834   GFRAA 122 02/15/2017 0834   Lab Results  Component Value Date   HGBA1C 5.5 02/15/2017   HGBA1C 5.7 (H) 09/05/2016   Lab Results  Component Value Date   INSULIN 36.9 (H) 02/15/2017   INSULIN 29.3 (H) 09/05/2016   CBC    Component Value Date/Time   WBC 9.4 09/05/2016 1010   WBC 11.3 (H) 04/22/2015 1615   RBC 5.35 09/05/2016 1010   RBC 5.49 04/22/2015 1615   HGB 15.2 09/05/2016 1010   HCT 44.5 09/05/2016 1010   PLT 249 09/05/2016 1010   MCV 83 09/05/2016 1010   MCH 28.4 09/05/2016 1010   MCH 28.1 10/20/2014 0700   MCHC 34.2 09/05/2016 1010   MCHC 34.1 04/22/2015 1615   RDW 14.6 09/05/2016 1010   LYMPHSABS 2.8 09/05/2016 1010   MONOABS 1.0 04/22/2015 1615   EOSABS 0.5 (H) 09/05/2016 1010   BASOSABS 0.1  09/05/2016 1010   Iron/TIBC/Ferritin/ %Sat No results found for: IRON, TIBC, FERRITIN, IRONPCTSAT Lipid Panel     Component Value Date/Time   CHOL 187 02/15/2017 0834   TRIG 100 02/15/2017 0834   HDL 37 (L) 02/15/2017 0834   CHOLHDL 4 03/20/2016 1708   VLDL 39.0 03/20/2016 1708   LDLCALC 130 (H) 02/15/2017 0834   LDLDIRECT 189.0 04/22/2015 1615   Hepatic Function Panel  Component Value Date/Time   PROT 7.4 02/15/2017 0834   ALBUMIN 4.7 02/15/2017 0834   AST 23 02/15/2017 0834   ALT 33 02/15/2017 0834   ALKPHOS 64 02/15/2017 0834   BILITOT 0.7 02/15/2017 0834   BILIDIR 0.1 02/19/2014 1048      Component Value Date/Time   TSH 2.210 09/05/2016 1010   TSH 0.44 10/27/2013 1103   TSH 1.17 11/22/2010 1447   Results for Sean [name], Sean [name] (MRN [id number]) as of 07/17/2017 17:08  Ref. Range 02/15/2017 08:34  Vitamin [name], 25-Hydroxy Latest Ref Range: 30.0 - 100.0 ng/mL 41.6   ASSESSMENT AND PLAN: Other depression - with emotional eating - Plan: buPROPion (WELLBUTRIN SR) 200 MG 12 hr tablet  At risk for heart disease  Class 3 severe obesity with serious comorbidity and body mass index (BMI) of 50.0 to 59.9 in adult, unspecified obesity type (HCC)  PLAN:  Depression with Emotional Eating Behaviors We discussed behavior modification techniques today to help Sean [name] deal with his emotional eating and depression. He has agreed to start Wellbutrin SR 200 mg qAM #30 with no refills and follow up as directed.  Cardiovascular risk counseling Sean [name] was given extended (15 minutes) coronary artery disease prevention counseling today. He is 42 y.o. male and has risk factors for heart disease including obesity. We discussed intensive lifestyle modifications today with an emphasis on specific weight loss instructions and strategies. Pt was also informed of the importance of increasing exercise and decreasing saturated fats to help prevent heart disease.  Obesity Sean [name] is currently in the action stage  of change. As such, his goal is to continue with weight loss efforts He has agreed to portion control better and make smarter food choices, such as increase vegetables and decrease simple carbohydrates  Sean [name] has been instructed to work up to a goal of 150 minutes of combined cardio and strengthening exercise per week for weight loss and overall health benefits. We discussed the following Behavioral Modification Strategies today: no skipping meals, increasing lean protein intake, decreasing simple carbohydrates , work on meal planning and easy cooking plans and emotional eating strategies  Sean [name] has agreed to follow up with our clinic in 3 weeks. He was informed of the importance of frequent follow up visits to maximize his success with intensive lifestyle modifications for his multiple health conditions.   OBESITY BEHAVIORAL INTERVENTION VISIT  Today's visit was # 17 out of 22.  Starting weight: 416 lbs Starting date: 09/05/16 Today's weight : 395 lbs  Today's date: 07/17/2017 Total lbs lost to date: 21 (Patients must lose 7 lbs in the first 6 months to continue with counseling)   ASK: We discussed the diagnosis of obesity with Sean [name] today and Sean [name] agreed to give us permission to discuss obesity behavioral modification therapy today.  ASSESS: Sean [name] has the diagnosis of obesity and his BMI today is 55.12 Sean [name] is in the action stage of change   ADVISE: Sean [name] was educated on the multiple health risks of obesity as well as the benefit of weight loss to improve his health. He was advised of the need for long term treatment and the importance of lifestyle modifications.  AGREE: Multiple dietary modification options and treatment options were discussed and  Sean [name] agreed to the above obesity treatment plan.  I, [place] CraneJoanne Murray, am acting as transcriptionist for Quillian Quincearen , MD  I have reviewed the above documentation for accuracy and completeness, and I agree with the above. -Quillian Quincearen  , MD

## 2017-07-18 ENCOUNTER — Telehealth (INDEPENDENT_AMBULATORY_CARE_PROVIDER_SITE_OTHER): Payer: Self-pay | Admitting: Family Medicine

## 2017-07-18 ENCOUNTER — Other Ambulatory Visit (INDEPENDENT_AMBULATORY_CARE_PROVIDER_SITE_OTHER): Payer: Self-pay | Admitting: Physician Assistant

## 2017-07-18 DIAGNOSIS — F3289 Other specified depressive episodes: Secondary | ICD-10-CM

## 2017-07-18 DIAGNOSIS — E7849 Other hyperlipidemia: Secondary | ICD-10-CM

## 2017-07-18 NOTE — Telephone Encounter (Signed)
Patient called, was seen yesterday, he stated he is having trouble at the pharmacy and really needs his medications.  He seems confused/concernd and would like to speak to someone.  Medications are Bupropion ER/SR 200mg  tab and Atorvastatin 20mg  tab.   [facility] [address], [place]  [phone]

## 2017-07-23 ENCOUNTER — Telehealth (INDEPENDENT_AMBULATORY_CARE_PROVIDER_SITE_OTHER): Payer: Self-pay | Admitting: Family Medicine

## 2017-07-23 ENCOUNTER — Other Ambulatory Visit (INDEPENDENT_AMBULATORY_CARE_PROVIDER_SITE_OTHER): Payer: Self-pay

## 2017-07-23 DIAGNOSIS — E7849 Other hyperlipidemia: Secondary | ICD-10-CM

## 2017-07-23 MED ORDER — ATORVASTATIN CALCIUM 20 MG PO TABS
20.0000 mg | ORAL_TABLET | Freq: Every day | ORAL | 0 refills | Status: DC
Start: 2017-07-23 — End: 2017-08-22

## 2017-07-23 NOTE — Telephone Encounter (Signed)
Spoke with the patient and he was confused about his medications being prescribed one month at a time. He states he needed to check his supply at home and call us back in the event he needs a refill. , CMA

## 2017-07-23 NOTE — Telephone Encounter (Signed)
Requesting refill of atorcastatin 20mg  tablet , send to [facility] on [address] in [place]

## 2017-07-23 NOTE — Telephone Encounter (Signed)
Prescription sent in to the pharmacy. April, CMA

## 2017-08-07 ENCOUNTER — Encounter (INDEPENDENT_AMBULATORY_CARE_PROVIDER_SITE_OTHER): Payer: Self-pay

## 2017-08-07 ENCOUNTER — Encounter (HOSPITAL_BASED_OUTPATIENT_CLINIC_OR_DEPARTMENT_OTHER): Payer: Self-pay | Admitting: Emergency Medicine

## 2017-08-07 ENCOUNTER — Other Ambulatory Visit: Payer: Self-pay

## 2017-08-07 ENCOUNTER — Emergency Department (HOSPITAL_BASED_OUTPATIENT_CLINIC_OR_DEPARTMENT_OTHER)
Admission: EM | Admit: 2017-08-07 | Discharge: 2017-08-07 | Disposition: A | Discharge status: Home / Self Care | Payer: PRIVATE HEALTH INSURANCE | Attending: Emergency Medicine | Admitting: Emergency Medicine

## 2017-08-07 ENCOUNTER — Ambulatory Visit (INDEPENDENT_AMBULATORY_CARE_PROVIDER_SITE_OTHER): Payer: PRIVATE HEALTH INSURANCE | Admitting: Family Medicine

## 2017-08-07 ENCOUNTER — Emergency Department (HOSPITAL_BASED_OUTPATIENT_CLINIC_OR_DEPARTMENT_OTHER): Payer: PRIVATE HEALTH INSURANCE

## 2017-08-07 DIAGNOSIS — L03116 Cellulitis of left lower limb: Secondary | ICD-10-CM | POA: Diagnosis not present

## 2017-08-07 DIAGNOSIS — R531 Weakness: Secondary | ICD-10-CM | POA: Diagnosis not present

## 2017-08-07 DIAGNOSIS — E876 Hypokalemia: Secondary | ICD-10-CM | POA: Diagnosis not present

## 2017-08-07 DIAGNOSIS — I10 Essential (primary) hypertension: Secondary | ICD-10-CM | POA: Diagnosis not present

## 2017-08-07 DIAGNOSIS — R509 Fever, unspecified: Secondary | ICD-10-CM | POA: Diagnosis present

## 2017-08-07 DIAGNOSIS — R0602 Shortness of breath: Secondary | ICD-10-CM | POA: Diagnosis not present

## 2017-08-07 DIAGNOSIS — Z7984 Long term (current) use of oral hypoglycemic drugs: Secondary | ICD-10-CM | POA: Insufficient documentation

## 2017-08-07 DIAGNOSIS — M7918 Myalgia, other site: Secondary | ICD-10-CM | POA: Insufficient documentation

## 2017-08-07 DIAGNOSIS — R7303 Prediabetes: Secondary | ICD-10-CM | POA: Diagnosis not present

## 2017-08-07 DIAGNOSIS — Z79899 Other long term (current) drug therapy: Secondary | ICD-10-CM | POA: Insufficient documentation

## 2017-08-07 LAB — COMPREHENSIVE METABOLIC PANEL
ALT: 27 U/L (ref 0–44)
AST: 28 U/L (ref 15–41)
Albumin: 4 g/dL (ref 3.5–5.0)
Alkaline Phosphatase: 58 U/L (ref 38–126)
Anion gap: 11 (ref 5–15)
BUN: 14 mg/dL (ref 6–20)
CHLORIDE: 102 mmol/L (ref 98–111)
CO2: 25 mmol/L (ref 22–32)
CREATININE: 0.89 mg/dL (ref 0.61–1.24)
Calcium: 9 mg/dL (ref 8.9–10.3)
GFR calc Af Amer: >60 mL/min (ref >60)
Glucose, Bld: 120 mg/dL — ABNORMAL HIGH (ref 70–99)
Potassium: 2.8 mmol/L — ABNORMAL LOW (ref 3.5–5.1)
Sodium: 138 mmol/L (ref 135–145)
Total Bilirubin: 0.8 mg/dL (ref 0.3–1.2)
Total Protein: 7.3 g/dL (ref 6.5–8.1)

## 2017-08-07 LAB — I-STAT CG4 LACTIC ACID, ED
Lactic Acid, Venous: 2.54 mmol/L (ref 0.5–1.9)
Lactic Acid, Venous: 1.03 mmol/L (ref 0.5–1.9)

## 2017-08-07 LAB — URINALYSIS, MICROSCOPIC (REFLEX)

## 2017-08-07 LAB — CBC WITH DIFFERENTIAL/PLATELET
Basophils Absolute: 0.1 10*3/uL (ref 0.0–0.1)
Basophils Relative: 0 %
EOS ABS: 0.1 10*3/uL (ref 0.0–0.7)
EOS PCT: 0 %
HCT: 41.3 % (ref 39.0–52.0)
Hemoglobin: 14.7 g/dL (ref 13.0–17.0)
LYMPHS ABS: 1.2 10*3/uL (ref 0.7–4.0)
Lymphocytes Relative: 7 %
MCH: 29.1 pg (ref 26.0–34.0)
MCHC: 35.6 g/dL (ref 30.0–36.0)
MCV: 81.8 fL (ref 78.0–100.0)
MONO ABS: 1.3 10*3/uL — AB (ref 0.1–1.0)
MONOS PCT: 7 %
Neutro Abs: 15.5 10*3/uL — ABNORMAL HIGH (ref 1.7–7.7)
Neutrophils Relative %: 86 %
PLATELETS: 237 10*3/uL (ref 150–400)
RBC: 5.05 MIL/uL (ref 4.22–5.81)
RDW: 13.3 % (ref 11.5–15.5)
WBC: 18.1 10*3/uL — AB (ref 4.0–10.5)

## 2017-08-07 LAB — URINALYSIS, ROUTINE W REFLEX MICROSCOPIC
BILIRUBIN URINE: NEGATIVE
Glucose, UA: NEGATIVE mg/dL
Ketones, ur: NEGATIVE mg/dL
Leukocytes, UA: NEGATIVE
Nitrite: NEGATIVE
PROTEIN: NEGATIVE mg/dL
Specific Gravity, Urine: 1.02 (ref 1.005–1.030)
pH: 5.5 (ref 5.0–8.0)

## 2017-08-07 MED ORDER — POTASSIUM CHLORIDE CRYS ER 20 MEQ PO TBCR
40.0000 meq | EXTENDED_RELEASE_TABLET | Freq: Once | ORAL | Status: AC
  Administered 2017-08-07: 40 meq via ORAL
  Filled 2017-08-07: qty 2

## 2017-08-07 MED ORDER — DOXYCYCLINE HYCLATE 100 MG PO TABS
100.0000 mg | ORAL_TABLET | Freq: Once | ORAL | Status: AC
  Administered 2017-08-07: 100 mg via ORAL
  Filled 2017-08-07: qty 1

## 2017-08-07 MED ORDER — POTASSIUM CHLORIDE CRYS ER 20 MEQ PO TBCR: 20.0000 meq | EXTENDED_RELEASE_TABLET | Freq: Every day | ORAL | 0 refills | Status: DC

## 2017-08-07 MED ORDER — SODIUM CHLORIDE 0.9 % IV BOLUS
1000.0000 mL | Freq: Once | INTRAVENOUS | Status: AC
  Administered 2017-08-07: 1000 mL via INTRAVENOUS

## 2017-08-07 MED ORDER — ACETAMINOPHEN 500 MG PO TABS
1000.0000 mg | ORAL_TABLET | Freq: Once | ORAL | Status: AC
  Administered 2017-08-07: 1000 mg via ORAL
  Filled 2017-08-07: qty 2

## 2017-08-07 MED ORDER — DOXYCYCLINE HYCLATE 100 MG PO CAPS: 100.0000 mg | ORAL_CAPSULE | Freq: Two times a day (BID) | ORAL | 0 refills | Status: DC

## 2017-08-07 MED FILL — POTASSIUM CL ER 20 MEQ TAB: 20 | 5 days supply | Qty: 5 | Fill #0

## 2017-08-07 MED FILL — DOXYCYCLINE HYCLATE 100 MG: 100 | 10 days supply | Qty: 20 | Fill #0

## 2017-08-07 NOTE — ED Notes (Signed)
ED Provider at bedside. 

## 2017-08-07 NOTE — ED Provider Notes (Signed)
[facility] EMERGENCY DEPARTMENT Provider Note   CSN: [id number] Arrival date & time: 08/07/17  0807     History   Chief Complaint Chief Complaint  Patient presents with  . Fever    HPI Sean [name] is a 42 y.o. male.  The history is provided by the patient. No language interpreter was used.  Fever    Sean [name] is a 42 y.o. male who presents to the Emergency Department complaining of fever. He was in his routine state of health yesterday and worked in the garden for several hours. He went to bed without difficulties. At 2 AM he awoke with shaking chills and body aches. This morning he took a shower and symptoms were worse. He reports he realized body aches as well as weakness. He did take 800 mg of ibuprofen at 7 AM with no significant change in his symptoms. He denies any headache, sore throat, chest pain, shortness of breath, nausea, vomiting, diarrhea, skin rash. His mother believes that he is more short of breath than usual and he disagrees. [place] but there are no reports of tick bites. No foreign travel. No known sick contacts. He denies any drug use. He takes metformin for prediabetes.  Past Medical History:  Diagnosis Date  . Heartburn   . Hyperlipidemia   . Hypertension   . Sleep apnea   . SOB (shortness of breath) on exertion   . Swelling    feet and legs    Patient Active Problem List   Diagnosis Date Noted  . Depression 02/15/2017  . Prediabetes 09/19/2016  . Vitamin D deficiency 09/19/2016  . Other fatigue 09/05/2016  . Dyspnea on exertion 09/05/2016  . Snoring 09/05/2016  . Class 3 obesity with serious comorbidity and body mass index (BMI) of 50.0 to 59.9 in adult 09/05/2016  . Patellofemoral arthritis of right knee 08/11/2016  . Severe obesity (BMI >= 40) (HCC) 05/09/2015  . Hyperlipidemia 02/19/2014  . Sinusitis, acute maxillary 02/09/2014  . Infected laceration 02/09/2014  . HTN (hypertension)  03/13/2011    History reviewed. No pertinent surgical history.      Home Medications    Prior to Admission medications   Medication Sig Start Date End Date Taking? Authorizing Provider  amLODipine (NORVASC) 10 MG tablet Take 1 tablet (10 mg total) by mouth daily. 06/05/17  Yes Camila Li, Sahar M, PA-C  atorvastatin (LIPITOR) 20 MG tablet Take 1 tablet (20 mg total) by mouth daily. 07/23/17  Yes Dalbert Garnet, Caren D, MD  buPROPion (WELLBUTRIN SR) 200 MG 12 hr tablet Take 1 tablet (200 mg total) by mouth daily at 12 noon. 07/17/17   Quillian Quince D, MD  calcium-vitamin D 250-100 MG-UNIT tablet Take 1 tablet by mouth 2 (two) times daily.    [provider]  celecoxib (CELEBREX) 200 MG capsule Take 1 capsule (200 mg total) by mouth 2 (two) times daily. 07/07/16   Donato Schultz, DO  chlorthalidone (HYGROTON) 25 MG tablet Take 1 tablet (25 mg total) by mouth daily. 06/26/17   Demetrio Lapping, PA-C  doxycycline (VIBRAMYCIN) 100 MG capsule Take 1 capsule (100 mg total) by mouth 2 (two) times daily. 08/07/17   Tilden Fossa, MD  Glucosamine-Chondroitin-MSM (TRIPLE FLEX PO) Take by mouth.    [provider]  ibuprofen (ADVIL,MOTRIN) 800 MG tablet Take 1 tablet (800 mg total) by mouth 3 (three) times daily. 09/04/14   Arby Barrette, MD  losartan (COZAAR) 100 MG tablet Take 1 tablet (100 mg total) by mouth daily. 06/05/17   Demetrio Lappingsman, Sahar M, PA-C  metFORMIN (GLUCOPHAGE) 500 MG tablet Take 1 tablet (500 mg total) by mouth 2 (two) times daily with a meal. 06/05/17   Illa Levelsman, Sahar M, PA-C  potassium chloride SA (K-DUR,KLOR-CON) 20 MEQ tablet Take 1 tablet (20 mEq total) by mouth daily. 08/07/17   Tilden Fossaees, , MD  Turmeric 500 MG TABS Take by mouth.    [provider]  Vitamin D, Ergocalciferol, (DRISDOL) 50000 units CAPS capsule Take 1 capsule (50,000 Units total) by mouth every 7 (seven) days. 06/05/17   Demetrio Lappingsman, Sahar M, PA-C    Family History Family History  Problem Relation Age of  Onset  . Heart disease Father 2256       MI  . Hypertension Father   . Sudden death Father        Heart disease  . Hyperlipidemia Father   . Obesity Father   . Diabetes Mother        Borderline  . Hyperlipidemia Mother   . Hypertension Mother   . Depression Mother   . Obesity Mother   . Breast cancer Maternal Grandmother     Social History Social History   Tobacco Use  . Smoking status: Never Smoker  . Smokeless tobacco: Never Used  Substance Use Topics  . Alcohol use: Yes  . Drug use: No     Allergies   Patient has no known allergies.   Review of Systems Review of Systems  Constitutional: Positive for fever.  All other systems reviewed and are negative.    Physical Exam Updated Vital Signs BP 118/65   Pulse 88   Temp 98.9 F (37.2 C)   Resp (!) 25   Ht 6' (1.829 m)   Wt (!) 176.9 kg (390 lb)   SpO2 93%   BMI 52.89 kg/m   Physical Exam  Constitutional: He is oriented to person, place, and time. He appears well-developed and well-nourished.  HENT:  Head: Normocephalic and atraumatic.  Right ear canal with mild erythema. TMs clear bilaterally. Oropharynx without any erythema or edema.  Cardiovascular: Regular rhythm.  No murmur heard. Tachycardic  Pulmonary/Chest: Effort normal and breath sounds normal. No respiratory distress.  Abdominal: Soft. There is no tenderness. There is no rebound and no guarding.  Musculoskeletal: He exhibits no tenderness.  Non pitting edema to bilateral lower extremities. Faint erythema to distal bilateral lower extremities.  Neurological: He is alert and oriented to person, place, and time.  Skin: Skin is warm. He is diaphoretic.  Psychiatric: He has a normal mood and affect. His behavior is normal.  Nursing note and vitals reviewed.    ED Treatments / Results  Labs (all labs ordered are listed, but only abnormal results are displayed) Labs Reviewed  COMPREHENSIVE METABOLIC PANEL - Abnormal; Notable for the following  components:      Result Value   Potassium 2.8 (*)    Glucose, Bld 120 (*)    All other components within normal limits  CBC WITH DIFFERENTIAL/PLATELET - Abnormal; Notable for the following components:   WBC 18.1 (*)    Neutro Abs 15.5 (*)    Monocytes Absolute 1.3 (*)    All other components within normal limits  URINALYSIS, ROUTINE W REFLEX MICROSCOPIC - Abnormal; Notable for the following components:   Hgb urine dipstick TRACE (*)    All other components within normal limits  URINALYSIS, MICROSCOPIC (REFLEX) - Abnormal;  Notable for the following components:   Bacteria, UA FEW (*)    All other components within normal limits  I-STAT CG4 LACTIC ACID, ED - Abnormal; Notable for the following components:   Lactic Acid, Venous 2.54 (*)    All other components within normal limits  CULTURE, BLOOD (ROUTINE X 2)  CULTURE, BLOOD (ROUTINE X 2)  I-STAT CG4 LACTIC ACID, ED    EKG EKG Interpretation  Date/Time:  Tuesday August 07 2017 08:34:09 EDT Ventricular Rate:  110 PR Interval:    QRS Duration: 118 QT Interval:  389 QTC Calculation: 527 R Axis:   62 Text Interpretation:  Sinus tachycardia Nonspecific intraventricular conduction delay Low voltage, precordial leads Baseline wander in lead(s) II III aVF V3 V4 V5 V6 no prior available for comparison Confirmed by Tilden Fossa [phone]) on 08/07/2017 8:47:32 AM   Radiology Dg Chest 2 View  Result Date: 08/07/2017 CLINICAL DATA:  Cough, fever EXAM: CHEST - 2 VIEW COMPARISON:  None. FINDINGS: Heart and mediastinal contours are within normal limits. No focal opacities or effusions. No acute bony abnormality. IMPRESSION: No active cardiopulmonary disease. Electronically Signed   By: Charlett Nose M.D.   On: 08/07/2017 09:03    Procedures Procedures (including critical care time)  Medications Ordered in ED Medications  sodium chloride 0.9 % bolus 1,000 mL (0 mLs Intravenous Stopped 08/07/17 0958)  acetaminophen (TYLENOL) tablet 1,000 mg  (1,000 mg Oral Given 08/07/17 0839)  sodium chloride 0.9 % bolus 1,000 mL (0 mLs Intravenous Stopped 08/07/17 1034)  doxycycline (VIBRA-TABS) tablet 100 mg (100 mg Oral Given 08/07/17 0915)  potassium chloride SA (K-DUR,KLOR-CON) CR tablet 40 mEq (40 mEq Oral Given 08/07/17 0915)     Initial Impression / Assessment and Plan / ED Course  I have reviewed the triage vital signs and the nursing notes.  Pertinent labs & imaging results that were available during my care of the patient were reviewed by me and considered in my medical decision making (see chart for details).     Patient here for evaluation of fever, chills that began earlier today. He was diaphoretic and tachycardic on ED arrival, this did improve after acetaminophen and IV fluid administration. He does have mild erythema to his lower extremities, left is slightly greater than right. Concern for possible cellulitis. RMSF is unlikely given patient is unaware of any recent tick bites [place]. Will treat with doxycycline to treat both skin infection and possible tickborne illness. No evidence of pneumonia. Presentation is not consistent with necrotizing soft tissue infection, sepsis, bacteremia, meningitis. Plan to discharge home with close outpatient follow-up as well as return precautions.  Final Clinical Impressions(s) / ED Diagnoses   Final diagnoses:  Cellulitis of left lower extremity  Hypokalemia    ED Discharge Orders        Ordered    doxycycline (VIBRAMYCIN) 100 MG capsule  2 times daily     08/07/17 1118    potassium chloride SA (K-DUR,KLOR-CON) 20 MEQ tablet  Daily     08/07/17 1118       Tilden Fossa, MD 08/07/17 1559

## 2017-08-07 NOTE — ED Triage Notes (Signed)
Fever since yesterday, 101 this morning. Took 800 mg ibuprofen at 7 this morning. Denies cough, sore throat, N/V/D.

## 2017-08-12 LAB — CULTURE, BLOOD (ROUTINE X 2)
CULTURE: NO GROWTH
Culture: NO GROWTH
SPECIAL REQUESTS: ADEQUATE

## 2017-08-17 ENCOUNTER — Ambulatory Visit (INDEPENDENT_AMBULATORY_CARE_PROVIDER_SITE_OTHER): Payer: PRIVATE HEALTH INSURANCE | Admitting: Family Medicine

## 2017-08-17 ENCOUNTER — Telehealth: Payer: Self-pay | Admitting: Family Medicine

## 2017-08-17 VITALS — BP 148/79 | HR 84 | Temp 98.7°F | Resp 16 | Ht 71.0 in | Wt >= 6400 oz

## 2017-08-17 DIAGNOSIS — Z872 Personal history of diseases of the skin and subcutaneous tissue: Secondary | ICD-10-CM

## 2017-08-17 DIAGNOSIS — R600 Localized edema: Secondary | ICD-10-CM

## 2017-08-17 DIAGNOSIS — I10 Essential (primary) hypertension: Secondary | ICD-10-CM

## 2017-08-17 DIAGNOSIS — E876 Hypokalemia: Secondary | ICD-10-CM | POA: Diagnosis not present

## 2017-08-17 LAB — COMPREHENSIVE METABOLIC PANEL
AG RATIO: 1.6 (calc) (ref 1.0–2.5)
ALT: 25 U/L (ref 9–46)
AST: 16 U/L (ref 10–40)
Albumin: 4.4 g/dL (ref 3.6–5.1)
Alkaline phosphatase (APISO): 67 U/L (ref 40–115)
BILIRUBIN TOTAL: 0.5 mg/dL (ref 0.2–1.2)
BUN: 11 mg/dL (ref 7–25)
CHLORIDE: 100 mmol/L (ref 98–110)
CO2: 28 mmol/L (ref 20–32)
Calcium: 9.5 mg/dL (ref 8.6–10.3)
Creat: 0.89 mg/dL (ref 0.60–1.35)
GLOBULIN: 2.7 g/dL (ref 1.9–3.7)
GLUCOSE: 85 mg/dL (ref 65–99)
POTASSIUM: 3.8 mmol/L (ref 3.5–5.3)
SODIUM: 139 mmol/L (ref 135–146)
TOTAL PROTEIN: 7.1 g/dL (ref 6.1–8.1)

## 2017-08-17 LAB — CBC WITH DIFFERENTIAL/PLATELET
Basophils Absolute: 73 cells/uL (ref 0–200)
Basophils Relative: 0.6 %
EOS ABS: 339 {cells}/uL (ref 15–500)
Eosinophils Relative: 2.8 %
HCT: 43.8 % (ref 38.5–50.0)
HEMOGLOBIN: 14.9 g/dL (ref 13.2–17.1)
Lymphs Abs: 3703 cells/uL (ref 850–3900)
MCH: 28.5 pg (ref 27.0–33.0)
MCHC: 34 g/dL (ref 32.0–36.0)
MCV: 83.9 fL (ref 80.0–100.0)
MONOS PCT: 7.5 %
MPV: 9.1 fL (ref 7.5–12.5)
NEUTROS ABS: 7079 {cells}/uL (ref 1500–7800)
Neutrophils Relative %: 58.5 %
Platelets: 332 10*3/uL (ref 140–400)
RBC: 5.22 10*6/uL (ref 4.20–5.80)
RDW: 13.1 % (ref 11.0–15.0)
TOTAL LYMPHOCYTE: 30.6 %
WBC mixed population: 908 cells/uL (ref 200–950)
WBC: 12.1 10*3/uL — ABNORMAL HIGH (ref 3.8–10.8)

## 2017-08-17 MED ORDER — CHLORTHALIDONE 25 MG PO TABS: 25.0000 mg | ORAL_TABLET | Freq: Every day | ORAL | 0 refills | Status: DC

## 2017-08-17 MED ORDER — POTASSIUM CHLORIDE CRYS ER 20 MEQ PO TBCR: 20.0000 meq | EXTENDED_RELEASE_TABLET | Freq: Every day | ORAL | 3 refills | Status: DC

## 2017-08-17 MED ORDER — LOSARTAN POTASSIUM 100 MG PO TABS: 100.0000 mg | ORAL_TABLET | Freq: Every day | ORAL | 1 refills | Status: DC

## 2017-08-17 NOTE — Progress Notes (Signed)
Patient ID: Sean [name], male   DOB: 03-24-1975, 42 y.o.   MRN: [id number]     Subjective:  I acted as a Neurosurgeonscribe for Dr. Zola ButtonLowne-Chase.  Sean [name], CMA   Patient ID: Sean [name], male    DOB: 03-24-1975, 42 y.o.   MRN: [id number]  Chief Complaint  Patient presents with  . er follow up    cellulitis    HPI  Patient is in today for follow up er visit for cellulitis of left lower leg.--  Much improved per pt.  No calf pain  Patient Care Team: Zola ButtonLowne Chase, Grayling CongressYvonne R, DO as PCP - General (Family Medicine)   Past Medical History:  Diagnosis Date  . Heartburn   . Hyperlipidemia   . Hypertension   . Sleep apnea   . SOB (shortness of breath) on exertion   . Swelling    feet and legs    No past surgical history on file.  Family History  Problem Relation Age of Onset  . Heart disease Father 7056       MI  . Hypertension Father   . Sudden death Father        Heart disease  . Hyperlipidemia Father   . Obesity Father   . Diabetes Mother        Borderline  . Hyperlipidemia Mother   . Hypertension Mother   . Depression Mother   . Obesity Mother   . Breast cancer Maternal Grandmother     Social History   Socioeconomic History  . Marital status: Single    Spouse name: Not on file  . Number of children: Not on file  . Years of education: Not on file  . Highest education level: Not on file  Occupational History  . Occupation: DietitianDining Services Manager  Social Needs  . Financial resource strain: Not on file  . Food insecurity:    Worry: Not on file    Inability: Not on file  . Transportation needs:    Medical: Not on file    Non-medical: Not on file  Tobacco Use  . Smoking status: Never Smoker  . Smokeless tobacco: Never Used  Substance and Sexual Activity  . Alcohol use: Yes  . Drug use: No  . Sexual activity: Yes  Lifestyle  . Physical activity:    Days per week: Not on file    Minutes per session: Not on file  . Stress: Not on file  Relationships  . Social  connections:    Talks on phone: Not on file    Gets together: Not on file    Attends religious service: Not on file    Active member of club or organization: Not on file    Attends meetings of clubs or organizations: Not on file    Relationship status: Not on file  . Intimate partner violence:    Fear of current or ex partner: Not on file    Emotionally abused: Not on file    Physically abused: Not on file    Forced sexual activity: Not on file  Other Topics Concern  . Not on file  Social History Narrative  . Not on file    Outpatient Medications Prior to Visit  Medication Sig Dispense Refill  . amLODipine (NORVASC) 10 MG tablet Take 1 tablet (10 mg total) by mouth daily. 30 tablet 0  . atorvastatin (LIPITOR) 20 MG tablet Take 1 tablet (20 mg total) by mouth daily. 30 tablet 0  . buPROPion (  WELLBUTRIN SR) 200 MG 12 hr tablet Take 1 tablet (200 mg total) by mouth daily at 12 noon. 30 tablet 0  . calcium-vitamin D 250-100 MG-UNIT tablet Take 1 tablet by mouth 2 (two) times daily.    . celecoxib (CELEBREX) 200 MG capsule Take 1 capsule (200 mg total) by mouth 2 (two) times daily. 30 capsule 5  . doxycycline (VIBRAMYCIN) 100 MG capsule Take 1 capsule (100 mg total) by mouth 2 (two) times daily. 20 capsule 0  . Glucosamine-Chondroitin-MSM (TRIPLE FLEX PO) Take by mouth.    Marland Kitchen ibuprofen (ADVIL,MOTRIN) 800 MG tablet Take 1 tablet (800 mg total) by mouth 3 (three) times daily. 21 tablet 0  . metFORMIN (GLUCOPHAGE) 500 MG tablet Take 1 tablet (500 mg total) by mouth 2 (two) times daily with a meal. 60 tablet 0  . potassium chloride SA (K-DUR,KLOR-CON) 20 MEQ tablet Take 1 tablet (20 mEq total) by mouth daily. 5 tablet 0  . Turmeric 500 MG TABS Take by mouth.    . Vitamin D, Ergocalciferol, (DRISDOL) 50000 units CAPS capsule Take 1 capsule (50,000 Units total) by mouth every 7 (seven) days. 4 capsule 0  . chlorthalidone (HYGROTON) 25 MG tablet Take 1 tablet (25 mg total) by mouth daily. 30  tablet 0  . losartan (COZAAR) 100 MG tablet Take 1 tablet (100 mg total) by mouth daily. 30 tablet 0   No facility-administered medications prior to visit.     No Known Allergies  Review of Systems  Constitutional: Negative for fever and malaise/fatigue.  HENT: Negative for congestion.   Eyes: Negative for blurred vision.  Respiratory: Negative for cough and shortness of breath.   Cardiovascular: Negative for chest pain, palpitations and leg swelling.  Gastrointestinal: Negative for vomiting.  Musculoskeletal: Negative for back pain.  Skin: Negative for rash.  Neurological: Negative for loss of consciousness and headaches.       Objective:    Physical Exam  BP (!) 148/79   Pulse 84   Temp 98.7 F (37.1 C) (Oral)   Resp 16   Ht 5\' 11"  (1.803 m)   Wt (!) 405 lb 6.4 oz (183.9 kg)   SpO2 100%   BMI 56.54 kg/m  Wt Readings from Last 3 Encounters:  08/17/17 (!) 405 lb 6.4 oz (183.9 kg)  08/07/17 (!) 390 lb (176.9 kg)  07/17/17 (!) 395 lb (179.2 kg)   BP Readings from Last 3 Encounters:  08/17/17 (!) 148/79  08/07/17 118/65  07/17/17 120/73     Immunization History  Administered Date(s) Administered  . Influenza,inj,Quad PF,6+ Mos 10/27/2013  . Influenza-Unspecified 11/07/2010, 11/07/2014, 11/18/2015  . Tdap 03/13/2011, 02/01/2014    Health Maintenance  Topic Date Due  . HIV Screening  12/28/1989  . INFLUENZA VACCINE  09/06/2017  . TETANUS/TDAP  02/02/2024    Lab Results  Component Value Date   WBC 12.1 (H) 08/17/2017   HGB 14.9 08/17/2017   HCT 43.8 08/17/2017   PLT 332 08/17/2017   GLUCOSE 85 08/17/2017   CHOL 187 02/15/2017   TRIG 100 02/15/2017   HDL 37 (L) 02/15/2017   LDLDIRECT 189.0 04/22/2015   LDLCALC 130 (H) 02/15/2017   ALT 25 08/17/2017   AST 16 08/17/2017   NA 139 08/17/2017   K 3.8 08/17/2017   CL 100 08/17/2017   CREATININE 0.89 08/17/2017   BUN 11 08/17/2017   CO2 28 08/17/2017   TSH 2.210 09/05/2016   HGBA1C 5.5 02/15/2017     Lab Results  Component  Value Date   TSH 2.210 09/05/2016   Lab Results  Component Value Date   WBC 12.1 (H) 08/17/2017   HGB 14.9 08/17/2017   HCT 43.8 08/17/2017   MCV 83.9 08/17/2017   PLT 332 08/17/2017   Lab Results  Component Value Date   NA 139 08/17/2017   K 3.8 08/17/2017   CO2 28 08/17/2017   GLUCOSE 85 08/17/2017   BUN 11 08/17/2017   CREATININE 0.89 08/17/2017   BILITOT 0.5 08/17/2017   ALKPHOS 58 08/07/2017   AST 16 08/17/2017   ALT 25 08/17/2017   PROT 7.1 08/17/2017   ALBUMIN 4.0 08/07/2017   CALCIUM 9.5 08/17/2017   ANIONGAP 11 08/07/2017   GFR 89.49 03/20/2016   Lab Results  Component Value Date   CHOL 187 02/15/2017   Lab Results  Component Value Date   HDL 37 (L) 02/15/2017   Lab Results  Component Value Date   LDLCALC 130 (H) 02/15/2017   Lab Results  Component Value Date   TRIG 100 02/15/2017   Lab Results  Component Value Date   CHOLHDL 4 03/20/2016   Lab Results  Component Value Date   HGBA1C 5.5 02/15/2017         Assessment & Plan:   Problem List Items Addressed This Visit      Unprioritized   History of cellulitis - Primary    Finish doxy-- improving       Relevant Orders   Comprehensive metabolic panel (Completed)   CBC with Differential/Platelet (Completed)   HTN (hypertension)   Relevant Medications   chlorthalidone (HYGROTON) 25 MG tablet   losartan (COZAAR) 100 MG tablet   Hypokalemia    Recheck today con't potassium supp      Relevant Medications   potassium chloride SA (K-DUR,KLOR-CON) 20 MEQ tablet   Lower extremity edema    Elevate legs con't chlorthiadone Check labs        ER records reviewed  >25 min spent with pt with >50% face to face discussing er visit, cellulitis and potassium   I have changed Sean [name]'s chlorthalidone. I am also having him start on potassium chloride SA. Additionally, I am having him maintain his ibuprofen, celecoxib, Turmeric, calcium-vitamin D,  Glucosamine-Chondroitin-MSM (TRIPLE FLEX PO), amLODipine, Vitamin D (Ergocalciferol), metFORMIN, buPROPion, atorvastatin, doxycycline, potassium chloride SA, and losartan.  Meds ordered this encounter  Medications  . potassium chloride SA (K-DUR,KLOR-CON) 20 MEQ tablet    Sig: Take 1 tablet (20 mEq total) by mouth daily.    Dispense:  30 tablet    Refill:  3  . chlorthalidone (HYGROTON) 25 MG tablet    Sig: Take 1 tablet (25 mg total) by mouth daily. 1-2 po qd    Dispense:  60 tablet    Refill:  0  . losartan (COZAAR) 100 MG tablet    Sig: Take 1 tablet (100 mg total) by mouth daily.    Dispense:  90 tablet    Refill:  1    CMA served as Neurosurgeon during this visit. History, Physical and Plan performed by medical provider. Documentation and orders reviewed and attested to.  Donato Schultz, DO

## 2017-08-17 NOTE — Patient Instructions (Signed)
Edema Edema is an abnormal buildup of fluids in your bodytissues. Edema is somewhatdependent on gravity to pull the fluid to the lowest place in your body. That makes the condition more common in the legs and thighs (lower extremities). Painless swelling of the feet and ankles is common and becomes more likely as you get older. It is also common in looser tissues, like around your eyes. When the affected area is squeezed, the fluid may move out of that spot and leave a dent for a few moments. This dent is called pitting. What are the causes? There are many possible causes of edema. Eating too much salt and being on your feet or sitting for a long time can cause edema in your legs and ankles. Hot weather may make edema worse. Common medical causes of edema include:  Heart failure.  Liver disease.  Kidney disease.  Weak blood vessels in your legs.  Cancer.  An injury.  Pregnancy.  Some medications.  Obesity.  What are the signs or symptoms? Edema is usually painless.Your skin may look swollen or shiny. How is this diagnosed? Your health care provider may be able to diagnose edema by asking about your medical history and doing a physical exam. You may need to have tests such as X-rays, an electrocardiogram, or blood tests to check for medical conditions that may cause edema. How is this treated? Edema treatment depends on the cause. If you have heart, liver, or kidney disease, you need the treatment appropriate for these conditions. General treatment may include:  Elevation of the affected body part above the level of your heart.  Compression of the affected body part. Pressure from elastic bandages or support stockings squeezes the tissues and forces fluid back into the blood vessels. This keeps fluid from entering the tissues.  Restriction of fluid and salt intake.  Use of a water pill (diuretic). These medications are appropriate only for some types of edema. They pull fluid  out of your body and make you urinate more often. This gets rid of fluid and reduces swelling, but diuretics can have side effects. Only use diuretics as directed by your health care provider.  Follow these instructions at home:  Keep the affected body part above the level of your heart when you are lying down.  Do not sit still or stand for prolonged periods.  Do not put anything directly under your knees when lying down.  Do not wear constricting clothing or garters on your upper legs.  Exercise your legs to work the fluid back into your blood vessels. This may help the swelling go down.  Wear elastic bandages or support stockings to reduce ankle swelling as directed by your health care provider.  Eat a low-salt diet to reduce fluid if your health care provider recommends it.  Only take medicines as directed by your health care provider. Contact a health care provider if:  Your edema is not responding to treatment.  You have heart, liver, or kidney disease and notice symptoms of edema.  You have edema in your legs that does not improve after elevating them.  You have sudden and unexplained weight gain. Get help right away if:  You develop shortness of breath or chest pain.  You cannot breathe when you lie down.  You develop pain, redness, or warmth in the swollen areas.  You have heart, liver, or kidney disease and suddenly get edema.  You have a fever and your symptoms suddenly get worse. This information is   not intended to replace advice given to you by your health care provider. Make sure you discuss any questions you have with your health care provider. Document Released: 01/23/2005 Document Revised: 07/01/2015 Document Reviewed: 11/15/2012 Elsevier Interactive Patient Education  2017 Elsevier Inc.  

## 2017-08-17 NOTE — Telephone Encounter (Signed)
Copied from CRM [id number]. Topic: Quick Communication - Rx Refill/Question >> Aug 17, 2017  5:24 PM Raquel SarnaHayes, Teresa G wrote: chlorthalidone (HYGROTON) 25 MG tablet  Needing clarification on directions for medication  [id number] - [place], [place] - [address] [place] [id number] Phone: [phone] Fax: [phone]

## 2017-08-17 NOTE — Telephone Encounter (Signed)
Ordered in another encounter.

## 2017-08-18 ENCOUNTER — Other Ambulatory Visit (INDEPENDENT_AMBULATORY_CARE_PROVIDER_SITE_OTHER): Payer: Self-pay | Admitting: Family Medicine

## 2017-08-18 DIAGNOSIS — F3289 Other specified depressive episodes: Secondary | ICD-10-CM

## 2017-08-18 DIAGNOSIS — E7849 Other hyperlipidemia: Secondary | ICD-10-CM

## 2017-08-19 ENCOUNTER — Encounter: Payer: Self-pay | Admitting: Family Medicine

## 2017-08-19 DIAGNOSIS — Z872 Personal history of diseases of the skin and subcutaneous tissue: Secondary | ICD-10-CM | POA: Insufficient documentation

## 2017-08-19 DIAGNOSIS — R600 Localized edema: Secondary | ICD-10-CM | POA: Insufficient documentation

## 2017-08-19 DIAGNOSIS — E876 Hypokalemia: Secondary | ICD-10-CM | POA: Insufficient documentation

## 2017-08-19 NOTE — Assessment & Plan Note (Signed)
Elevate legs con't chlorthiadone Check labs

## 2017-08-19 NOTE — Assessment & Plan Note (Signed)
Recheck today con't potassium supp

## 2017-08-19 NOTE — Assessment & Plan Note (Signed)
Finish doxy-- improving

## 2017-08-21 ENCOUNTER — Other Ambulatory Visit: Payer: Self-pay

## 2017-08-21 ENCOUNTER — Ambulatory Visit (INDEPENDENT_AMBULATORY_CARE_PROVIDER_SITE_OTHER): Payer: PRIVATE HEALTH INSURANCE | Admitting: Family Medicine

## 2017-08-21 DIAGNOSIS — Z872 Personal history of diseases of the skin and subcutaneous tissue: Secondary | ICD-10-CM

## 2017-08-22 ENCOUNTER — Ambulatory Visit (INDEPENDENT_AMBULATORY_CARE_PROVIDER_SITE_OTHER): Payer: PRIVATE HEALTH INSURANCE | Admitting: Family Medicine

## 2017-08-22 ENCOUNTER — Other Ambulatory Visit (INDEPENDENT_AMBULATORY_CARE_PROVIDER_SITE_OTHER): Payer: PRIVATE HEALTH INSURANCE

## 2017-08-22 VITALS — BP 115/73 | HR 87 | Temp 98.3°F | Ht 71.0 in | Wt 398.0 lb

## 2017-08-22 DIAGNOSIS — I10 Essential (primary) hypertension: Secondary | ICD-10-CM

## 2017-08-22 DIAGNOSIS — Z872 Personal history of diseases of the skin and subcutaneous tissue: Secondary | ICD-10-CM | POA: Diagnosis not present

## 2017-08-22 DIAGNOSIS — Z6841 Body Mass Index (BMI) 40.0 and over, adult: Secondary | ICD-10-CM | POA: Diagnosis not present

## 2017-08-22 DIAGNOSIS — E7849 Other hyperlipidemia: Secondary | ICD-10-CM

## 2017-08-22 DIAGNOSIS — F3289 Other specified depressive episodes: Secondary | ICD-10-CM | POA: Diagnosis not present

## 2017-08-22 MED ORDER — CHLORTHALIDONE 25 MG PO TABS: 25.0000 mg | ORAL_TABLET | Freq: Every day | ORAL | 0 refills | Status: DC

## 2017-08-22 MED ORDER — BUPROPION HCL ER (SR) 200 MG PO TB12: 200.0000 mg | ORAL_TABLET | Freq: Every day | ORAL | 0 refills | Status: DC

## 2017-08-22 MED ORDER — ATORVASTATIN CALCIUM 20 MG PO TABS: 20.0000 mg | ORAL_TABLET | Freq: Every day | ORAL | 0 refills | Status: DC

## 2017-08-23 LAB — CBC WITH DIFFERENTIAL/PLATELET
Basophils Absolute: 0.1 10*3/uL (ref 0.0–0.1)
Basophils Relative: 0.9 % (ref 0.0–3.0)
EOS PCT: 3.1 % (ref 0.0–5.0)
Eosinophils Absolute: 0.3 10*3/uL (ref 0.0–0.7)
HCT: 42.2 % (ref 39.0–52.0)
Hemoglobin: 14.5 g/dL (ref 13.0–17.0)
LYMPHS ABS: 2.9 10*3/uL (ref 0.7–4.0)
Lymphocytes Relative: 26.7 % (ref 12.0–46.0)
MCHC: 34.4 g/dL (ref 30.0–36.0)
MCV: 84.5 fl (ref 78.0–100.0)
MONOS PCT: 8.2 % (ref 3.0–12.0)
Monocytes Absolute: 0.9 10*3/uL (ref 0.1–1.0)
NEUTROS PCT: 61.1 % (ref 43.0–77.0)
Neutro Abs: 6.7 10*3/uL (ref 1.4–7.7)
Platelets: 300 10*3/uL (ref 150.0–400.0)
RBC: 5 Mil/uL (ref 4.22–5.81)
RDW: 13.7 % (ref 11.5–15.5)
WBC: 10.9 10*3/uL — ABNORMAL HIGH (ref 4.0–10.5)

## 2017-08-23 NOTE — Progress Notes (Signed)
ch  Office: [phone]  /  Fax: [phone]   HPI:   Chief Complaint: OBESITY Sean [name] is here to discuss his progress with his obesity treatment plan. He is on the portion control, smart choices plan and is following his eating plan approximately 20 to 25% of the time. He states he is exercising 0 minutes 0 times per week. Sean [name] was recently diagnosed with cellulitis and is retaining significant amounts of fluid causing his weight to increase. He has not been following a structured plan, but is ready to get back to that. His weight is (!) 398 lb (180.5 kg) today and has gained weight since his last visit. He has lost 18 lbs since starting treatment with [name].  Hypertension Sean [name] is a 42 y.o. male with hypertension. He is confused about which medicines to take; the HCTZ or Chlorthalidone and is still taking HCTZ. Per his PCP's note, he should have changed to Chlorthalidone. Sean [name] denies chest pain or shortness of breath on exertion. He is working weight loss to help control his blood pressure with the goal of decreasing his risk of heart attack and stroke.   Hyperlipidemia Sean [name] has hyperlipidemia and has been trying to improve his cholesterol levels with intensive lifestyle modification including a low saturated fat diet, exercise and weight loss. He denies any chest pain, claudication or myalgias. Sean [name] is still on Lipitor working on his diet.  Depression with emotional eating behaviors Sean [name] mood is stable. His blood pressure is stable. He is doing better with decreasing emotional eating overall. He has been working on behavior modification techniques to help reduce his emotional eating. He shows no sign of suicidal or homicidal ideations.  Depression screen [facility] 2/9 09/05/2016 12/02/2015  Decreased Interest 3 0  Down, Depressed, Hopeless 2 0  PHQ - 2 Score 5 0  Altered sleeping 3 -  Tired, decreased energy 3 -  Change in appetite 3 -  Feeling bad or failure about yourself  2 -    Moving slowly or fidgety/restless 0 -  Suicidal thoughts 0 -  PHQ-9 Score 16 -     ALLERGIES: No Known Allergies  MEDICATIONS: Current Outpatient Medications on File Prior to Visit  Medication Sig Dispense Refill  . amLODipine (NORVASC) 10 MG tablet Take 1 tablet (10 mg total) by mouth daily. 30 tablet 0  . calcium-vitamin D 250-100 MG-UNIT tablet Take 1 tablet by mouth 2 (two) times daily.    . celecoxib (CELEBREX) 200 MG capsule Take 1 capsule (200 mg total) by mouth 2 (two) times daily. 30 capsule 5  . Glucosamine-Chondroitin-MSM (TRIPLE FLEX PO) Take by mouth.    Marland Kitchen ibuprofen (ADVIL,MOTRIN) 800 MG tablet Take 1 tablet (800 mg total) by mouth 3 (three) times daily. 21 tablet 0  . losartan (COZAAR) 100 MG tablet Take 1 tablet (100 mg total) by mouth daily. 90 tablet 1  . metFORMIN (GLUCOPHAGE) 500 MG tablet Take 1 tablet (500 mg total) by mouth 2 (two) times daily with a meal. 60 tablet 0  . potassium chloride SA (K-DUR,KLOR-CON) 20 MEQ tablet Take 1 tablet (20 mEq total) by mouth daily. 5 tablet 0  . potassium chloride SA (K-DUR,KLOR-CON) 20 MEQ tablet Take 1 tablet (20 mEq total) by mouth daily. 30 tablet 3  . Turmeric 500 MG TABS Take by mouth.    . Vitamin D, Ergocalciferol, (DRISDOL) 50000 units CAPS capsule Take 1 capsule (50,000 Units total) by mouth every 7 (seven) days. 4 capsule 0   No current  facility-administered medications on file prior to visit.     PAST MEDICAL HISTORY: Past Medical History:  Diagnosis Date  . Heartburn   . Hyperlipidemia   . Hypertension   . Sleep apnea   . SOB (shortness of breath) on exertion   . Swelling    feet and legs    PAST SURGICAL HISTORY: No past surgical history on file.  SOCIAL HISTORY: Social History   Tobacco Use  . Smoking status: Never Smoker  . Smokeless tobacco: Never Used  Substance Use Topics  . Alcohol use: Yes  . Drug use: No    FAMILY HISTORY: Family History  Problem Relation Age of Onset  . Heart  disease Father 45       MI  . Hypertension Father   . Sudden death Father        Heart disease  . Hyperlipidemia Father   . Obesity Father   . Diabetes Mother        Borderline  . Hyperlipidemia Mother   . Hypertension Mother   . Depression Mother   . Obesity Mother   . Breast cancer Maternal Grandmother     ROS: Review of Systems  Constitutional: Negative for weight loss.  Respiratory:       Negative for shortness of breath with exertion  Cardiovascular: Negative for chest pain and claudication.  Musculoskeletal: Negative for myalgias.  Psychiatric/Behavioral: Positive for depression. Negative for suicidal ideas.       Negative for homicidal ideations    PHYSICAL EXAM: Blood pressure 115/73, pulse 87, temperature 98.3 F (36.8 C), temperature source Oral, height 5\' 11"  (1.803 m), weight (!) 398 lb (180.5 kg), SpO2 98 %. Body mass index is 55.51 kg/m. Physical Exam  Constitutional: He is oriented to person, place, and time. He appears well-developed and well-nourished.  Cardiovascular: Normal rate.  Pulmonary/Chest: Effort normal.  Musculoskeletal: Normal range of motion.  Neurological: He is oriented to person, place, and time.  Skin: Skin is warm and dry.  Psychiatric: He has a normal mood and affect. His behavior is normal.  Vitals reviewed.   RECENT LABS AND TESTS: BMET    Component Value Date/Time   NA 139 08/17/2017 1706   NA 139 02/15/2017 0834   K 3.8 08/17/2017 1706   CL 100 08/17/2017 1706   CO2 28 08/17/2017 1706   GLUCOSE 85 08/17/2017 1706   BUN 11 08/17/2017 1706   BUN 14 02/15/2017 0834   CREATININE 0.89 08/17/2017 1706   CALCIUM 9.5 08/17/2017 1706   GFRNONAA >60 08/07/2017 0838   GFRAA >60 08/07/2017 0838   Lab Results  Component Value Date   HGBA1C 5.5 02/15/2017   HGBA1C 5.7 (H) 09/05/2016   Lab Results  Component Value Date   INSULIN 36.9 (H) 02/15/2017   INSULIN 29.3 (H) 09/05/2016   CBC    Component Value Date/Time   WBC  12.1 (H) 08/17/2017 1706   RBC 5.22 08/17/2017 1706   HGB 14.9 08/17/2017 1706   HGB 15.2 09/05/2016 1010   HCT 43.8 08/17/2017 1706   HCT 44.5 09/05/2016 1010   PLT 332 08/17/2017 1706   PLT 249 09/05/2016 1010   MCV 83.9 08/17/2017 1706   MCV 83 09/05/2016 1010   MCH 28.5 08/17/2017 1706   MCHC 34.0 08/17/2017 1706   RDW 13.1 08/17/2017 1706   RDW 14.6 09/05/2016 1010   LYMPHSABS 3,703 08/17/2017 1706   LYMPHSABS 2.8 09/05/2016 1010   MONOABS 1.3 (H) 08/07/2017 0838   EOSABS  339 08/17/2017 1706   EOSABS 0.5 (H) 09/05/2016 1010   BASOSABS 73 08/17/2017 1706   BASOSABS 0.1 09/05/2016 1010   Iron/TIBC/Ferritin/ %Sat No results found for: IRON, TIBC, FERRITIN, IRONPCTSAT Lipid Panel     Component Value Date/Time   CHOL 187 02/15/2017 0834   TRIG 100 02/15/2017 0834   HDL 37 (L) 02/15/2017 0834   CHOLHDL 4 03/20/2016 1708   VLDL 39.0 03/20/2016 1708   LDLCALC 130 (H) 02/15/2017 0834   LDLDIRECT 189.0 04/22/2015 1615   Hepatic Function Panel     Component Value Date/Time   PROT 7.1 08/17/2017 1706   PROT 7.4 02/15/2017 0834   ALBUMIN 4.0 08/07/2017 0838   ALBUMIN 4.7 02/15/2017 0834   AST 16 08/17/2017 1706   ALT 25 08/17/2017 1706   ALKPHOS 58 08/07/2017 0838   BILITOT 0.5 08/17/2017 1706   BILITOT 0.7 02/15/2017 0834   BILIDIR 0.1 02/19/2014 1048      Component Value Date/Time   TSH 2.210 09/05/2016 1010   TSH 0.44 10/27/2013 1103   TSH 1.17 11/22/2010 1447    ASSESSMENT AND PLAN: Other hyperlipidemia - Plan: atorvastatin (LIPITOR) 20 MG tablet  Essential hypertension - Plan: chlorthalidone (HYGROTON) 25 MG tablet  Other depression - with emotional eating - Plan: buPROPion (WELLBUTRIN SR) 200 MG 12 hr tablet  Class 3 severe obesity with serious comorbidity and body mass index (BMI) of 50.0 to 59.9 in adult, unspecified obesity type (HCC)  PLAN: Hypertension We discussed sodium restriction, working on healthy weight loss, and a regular exercise program  as the means to achieve improved blood pressure control. Sean [name] agreed with this plan and agreed to follow up as directed. We will continue to monitor his blood pressure as well as his progress with the above lifestyle modifications. He will discontinue HCTZ and restart Chlorthalidone 25mg  qd as prescribed and will watch for signs of hypotension as he continues his lifestyle modifications.  Hyperlipidemia Sean [name] was informed of the American Heart Association Guidelines emphasizing intensive lifestyle modifications as the first line treatment for hyperlipidemia. We discussed many lifestyle modifications today in depth, and Sean [name] will continue to work on decreasing saturated fats such as fatty red meat, butter and many fried foods. He will also increase vegetables and lean protein in his diet and continue to work on exercise and weight loss efforts. Sean [name] is still on Lipitor 20mg  qd #30 with no refills. He will follow up as directed.   Depression with Emotional Eating Behaviors We discussed behavior modification techniques today to help Sean [name] deal with his emotional eating and depression. He has agreed to take Wellbutrin SR 200 mg qd #30 with no refills and agreed to follow up as directed.  Obesity Sean [name] is currently in the action stage of change. As such, his goal is to continue with weight loss efforts. He has agreed to change to the Category 3 plan. Sean [name] has been instructed to hold off on exercise until his leg has healed and then work up to a goal of 150 minutes of combined cardio and strengthening exercise per week for weight loss and overall health benefits. We discussed the following Behavioral Modification Strategies today: work on meal planning and easy cooking plans, emotional eating strategies, and better snacking choices.  Sean [name] has agreed to follow up with our clinic in 2 to 3 weeks. He was informed of the importance of frequent follow up visits to maximize his success with intensive lifestyle  modifications for his multiple health conditions.   OBESITY  BEHAVIORAL INTERVENTION VISIT  Today's visit was # 18 out of 22.  Starting weight: 416 Starting date: 09/05/16 Today's weight : Weight: (!) 398 lb (180.5 kg)  Today's date: 08/23/2017 Total lbs lost to date: 48    ASK: We discussed the diagnosis of obesity with Aurea Graff today and [place] agreed to give [name] permission to discuss obesity behavioral modification therapy today.  ASSESS: Shamere has the diagnosis of obesity and his BMI today is 55.51 Saurav is in the action stage of change.   ADVISE: Anshul was educated on the multiple health risks of obesity as well as the benefit of weight loss to improve his health. He was advised of the need for long term treatment and the importance of lifestyle modifications.  AGREE: Multiple dietary modification options and treatment options were discussed and  Deaunte agreed to the above obesity treatment plan.  I, Kirke Corin, am acting as Energy manager for Quillian Quince, MD  I have reviewed the above documentation for accuracy and completeness, and I agree with the above. -Quillian Quince, MD

## 2017-09-07 ENCOUNTER — Encounter: Payer: Self-pay | Admitting: Family Medicine

## 2017-09-07 ENCOUNTER — Ambulatory Visit (INDEPENDENT_AMBULATORY_CARE_PROVIDER_SITE_OTHER): Payer: PRIVATE HEALTH INSURANCE | Admitting: Family Medicine

## 2017-09-07 DIAGNOSIS — L03116 Cellulitis of left lower limb: Secondary | ICD-10-CM | POA: Diagnosis not present

## 2017-09-07 MED ORDER — LIRAGLUTIDE -WEIGHT MANAGEMENT 18 MG/3ML ~~LOC~~ SOPN: 3.0000 mg | PEN_INJECTOR | Freq: Every day | SUBCUTANEOUS | Status: DC

## 2017-09-07 NOTE — Progress Notes (Signed)
Patient ID: Sean [name], male    DOB: 13-Nov-1975  Age: 42 y.o. MRN: [id number]    Subjective:  Subjective  HPI Sean [name] presents for f/u cellulitis--- it has resolved    Pt is struggling with weight loss  -- the saxenda worked but his ins did not cover it.  Review of Systems  Constitutional: Negative.   HENT: Negative for congestion, ear pain, hearing loss, nosebleeds, postnasal drip, rhinorrhea, sinus pressure, sneezing and tinnitus.   Eyes: Negative for photophobia, discharge, itching and visual disturbance.  Respiratory: Negative.   Cardiovascular: Negative.   Gastrointestinal: Negative for abdominal distention, abdominal pain, anal bleeding, blood in stool and constipation.  Endocrine: Negative.   Genitourinary: Negative.   Musculoskeletal: Negative.   Skin: Negative.   Allergic/Immunologic: Negative.   Neurological: Negative for dizziness, weakness, light-headedness, numbness and headaches.  Psychiatric/Behavioral: Negative for agitation, confusion, decreased concentration, dysphoric mood, sleep disturbance and suicidal ideas. The patient is not nervous/anxious.     History Past Medical History:  Diagnosis Date  . Heartburn   . Hyperlipidemia   . Hypertension   . Sleep apnea   . SOB (shortness of breath) on exertion   . Swelling    feet and legs    He has no past surgical history on file.   His family history includes Breast cancer in his maternal grandmother; Depression in his mother; Diabetes in his mother; Heart disease (age of onset: 43) in his father; Hyperlipidemia in his father and mother; Hypertension in his father and mother; Obesity in his father and mother; Sudden death in his father.He reports that he has never smoked. He has never used smokeless tobacco. He reports that he drinks alcohol. He reports that he does not use drugs.  Current Outpatient Medications on File Prior to Visit  Medication Sig Dispense Refill  . amLODipine (NORVASC) 10 MG tablet  Take 1 tablet (10 mg total) by mouth daily. 30 tablet 0  . atorvastatin (LIPITOR) 20 MG tablet Take 1 tablet (20 mg total) by mouth daily. 30 tablet 0  . buPROPion (WELLBUTRIN SR) 200 MG 12 hr tablet Take 1 tablet (200 mg total) by mouth daily at 12 noon. 30 tablet 0  . calcium-vitamin D 250-100 MG-UNIT tablet Take 1 tablet by mouth 2 (two) times daily.    . celecoxib (CELEBREX) 200 MG capsule Take 1 capsule (200 mg total) by mouth 2 (two) times daily. 30 capsule 5  . chlorthalidone (HYGROTON) 25 MG tablet Take 1 tablet (25 mg total) by mouth daily. 30 tablet 0  . Glucosamine-Chondroitin-MSM (TRIPLE FLEX PO) Take by mouth.    Marland Kitchen ibuprofen (ADVIL,MOTRIN) 800 MG tablet Take 1 tablet (800 mg total) by mouth 3 (three) times daily. 21 tablet 0  . losartan (COZAAR) 100 MG tablet Take 1 tablet (100 mg total) by mouth daily. 90 tablet 1  . metFORMIN (GLUCOPHAGE) 500 MG tablet Take 1 tablet (500 mg total) by mouth 2 (two) times daily with a meal. 60 tablet 0  . potassium chloride SA (K-DUR,KLOR-CON) 20 MEQ tablet Take 1 tablet (20 mEq total) by mouth daily. 30 tablet 3  . Turmeric 500 MG TABS Take by mouth.    . Vitamin D, Ergocalciferol, (DRISDOL) 50000 units CAPS capsule Take 1 capsule (50,000 Units total) by mouth every 7 (seven) days. 4 capsule 0   No current facility-administered medications on file prior to visit.      Objective:  Objective  Physical Exam  Constitutional: He is oriented to  person, place, and time. Vital signs are normal. He appears well-developed and well-nourished. He is sleeping.  HENT:  Head: Normocephalic and atraumatic.  Mouth/Throat: Oropharynx is clear and moist.  Eyes: Pupils are equal, round, and reactive to light. EOM are normal.  Neck: Normal range of motion. Neck supple. No thyromegaly present.  Cardiovascular: Normal rate and regular rhythm.  No murmur heard. Pulmonary/Chest: Effort normal and breath sounds normal. No respiratory distress. He has no wheezes. He  has no rales. He exhibits no tenderness.  Musculoskeletal: He exhibits no edema or tenderness.  Neurological: He is alert and oriented to person, place, and time.  Skin: Skin is warm and dry.  Psychiatric: He has a normal mood and affect. His behavior is normal. Judgment and thought content normal.  Nursing note and vitals reviewed.  BP 118/75 (BP Location: Left Arm, Patient Position: Sitting, Cuff Size: Large)   Pulse 98   Temp 99.3 F (37.4 C) (Oral)   Ht 5\' 11"  (1.803 m)   Wt (!) 403 lb 3.2 oz (182.9 kg)   SpO2 97%   BMI 56.23 kg/m  Wt Readings from Last 3 Encounters:  09/07/17 (!) 403 lb 3.2 oz (182.9 kg)  08/22/17 (!) 398 lb (180.5 kg)  08/17/17 (!) 405 lb 6.4 oz (183.9 kg)     Lab Results  Component Value Date   WBC 10.9 (H) 08/22/2017   HGB 14.5 08/22/2017   HCT 42.2 08/22/2017   PLT 300.0 08/22/2017   GLUCOSE 85 08/17/2017   CHOL 187 02/15/2017   TRIG 100 02/15/2017   HDL 37 (L) 02/15/2017   LDLDIRECT 189.0 04/22/2015   LDLCALC 130 (H) 02/15/2017   ALT 25 08/17/2017   AST 16 08/17/2017   NA 139 08/17/2017   K 3.8 08/17/2017   CL 100 08/17/2017   CREATININE 0.89 08/17/2017   BUN 11 08/17/2017   CO2 28 08/17/2017   TSH 2.210 09/05/2016   HGBA1C 5.5 02/15/2017    Dg Chest 2 View  Result Date: 08/07/2017 CLINICAL DATA:  Cough, fever EXAM: CHEST - 2 VIEW COMPARISON:  None. FINDINGS: Heart and mediastinal contours are within normal limits. No focal opacities or effusions. No acute bony abnormality. IMPRESSION: No active cardiopulmonary disease. Electronically Signed   By: Charlett NoseKevin  Dover M.D.   On: 08/07/2017 09:03     Assessment & Plan:  Plan  I am having Sean [name] start on Liraglutide -Weight Management. I am also having him maintain his ibuprofen, celecoxib, Turmeric, calcium-vitamin D, Glucosamine-Chondroitin-MSM (TRIPLE FLEX PO), amLODipine, Vitamin D (Ergocalciferol), metFORMIN, potassium chloride SA, losartan, chlorthalidone, atorvastatin, and  buPROPion.  Meds ordered this encounter  Medications  . Liraglutide -Weight Management (SAXENDA) 18 MG/3ML SOPN    Sig: Inject 3 mg into the skin daily.    Problem List Items Addressed This Visit      Unprioritized   Cellulitis of left lower extremity    Resolved       Morbid obesity (HCC) - Primary    con't healthy weight and wellness Gave pt samples of saxenda to use for a few months       Relevant Medications   Liraglutide -Weight Management (SAXENDA) 18 MG/3ML SOPN      Follow-up: Return in about 6 months (around 03/10/2018), or if symptoms worsen or fail to improve.  Donato SchultzYvonne R Lowne Chase, DO

## 2017-09-07 NOTE — Assessment & Plan Note (Signed)
con't healthy weight and wellness Gave pt samples of saxenda to use for a few months

## 2017-09-07 NOTE — Assessment & Plan Note (Signed)
Resolved

## 2017-09-07 NOTE — Patient Instructions (Signed)

## 2017-09-13 ENCOUNTER — Ambulatory Visit (INDEPENDENT_AMBULATORY_CARE_PROVIDER_SITE_OTHER): Payer: PRIVATE HEALTH INSURANCE | Admitting: Family Medicine

## 2017-09-13 VITALS — BP 121/78 | HR 83 | Temp 98.3°F | Ht 71.0 in | Wt 392.0 lb

## 2017-09-13 DIAGNOSIS — I10 Essential (primary) hypertension: Secondary | ICD-10-CM | POA: Diagnosis not present

## 2017-09-13 DIAGNOSIS — E559 Vitamin D deficiency, unspecified: Secondary | ICD-10-CM | POA: Diagnosis not present

## 2017-09-13 DIAGNOSIS — E7849 Other hyperlipidemia: Secondary | ICD-10-CM

## 2017-09-13 DIAGNOSIS — R7303 Prediabetes: Secondary | ICD-10-CM | POA: Diagnosis not present

## 2017-09-13 DIAGNOSIS — Z6841 Body Mass Index (BMI) 40.0 and over, adult: Secondary | ICD-10-CM

## 2017-09-13 DIAGNOSIS — F3289 Other specified depressive episodes: Secondary | ICD-10-CM

## 2017-09-13 MED ORDER — ATORVASTATIN CALCIUM 20 MG PO TABS: 20.0000 mg | ORAL_TABLET | Freq: Every day | ORAL | 0 refills | Status: DC

## 2017-09-13 MED ORDER — VITAMIN D (ERGOCALCIFEROL) 1.25 MG (50000 UNIT) PO CAPS: 50000.0000 [IU] | ORAL_CAPSULE | ORAL | 0 refills | Status: DC

## 2017-09-13 MED ORDER — BUPROPION HCL ER (SR) 200 MG PO TB12: 200.0000 mg | ORAL_TABLET | Freq: Every day | ORAL | 0 refills | Status: DC

## 2017-09-13 MED ORDER — METFORMIN HCL 500 MG PO TABS: 500.0000 mg | ORAL_TABLET | Freq: Two times a day (BID) | ORAL | 0 refills | Status: DC

## 2017-09-13 MED ORDER — CHLORTHALIDONE 25 MG PO TABS: 25.0000 mg | ORAL_TABLET | Freq: Every day | ORAL | 0 refills | Status: DC

## 2017-09-14 LAB — VITAMIN D 25 HYDROXY (VIT D DEFICIENCY, FRACTURES): VIT D 25 HYDROXY: 42.6 ng/mL (ref 30.0–100.0)

## 2017-09-17 NOTE — Progress Notes (Signed)
Office: [phone]  /  Fax: [phone]   HPI:   Chief Complaint: OBESITY Sean [name] is here to discuss his progress with his obesity treatment plan. He is on the Category 3 plan and is following his eating plan approximately 20 % of the time. He states he is doing cardio and weights 60 to 90 minutes 3 times per week. Sean [name] saw his PCP and asked her for samples of Saxenda, which she has given him. He is on 0.6 mg for now and feels it is helping decrease his hunger. He is not following his plan closely and is just trying to eat less of the food he has eaten previously. His weight is (!) 392 lb (177.8 kg) today and has had a weight loss of 6 pounds over a period of 3 weeks since his last visit. He has lost 24 lbs since starting treatment with [name].  Vitamin [name] deficiency Sean [name] has a diagnosis of vitamin [name] deficiency. He is stable on vit [name], but he hasn't been taking it lately. Sean [name] denies nausea, vomiting or muscle weakness.  Hyperlipidemia Sean [name] has hyperlipidemia and is on Lipitor currently. Sean [name] has been trying to lose weight. He has been trying to improve his cholesterol levels with intensive lifestyle modification including a low saturated fat diet, exercise and weight loss. He denies any chest pain or myalgias.   Hypertension Sean [name] is a 42 y.o. male with hypertension. Sean [name] denies chest pain. He is working weight loss to help control his blood pressure with the goal of decreasing his risk of heart attack and stroke. Sean [name] blood pressure improved on medications and his electrolytes are within normal limits.  Pre-Diabetes Sean [name] has a diagnosis of prediabetes based on his elevated Hgb A1c and was informed this puts him at greater risk of developing diabetes. Sean [name] is on metformin and he started [name] by his PCP. He continues to work on diet and exercise to decrease risk of diabetes. He denies nausea or hypoglycemia.  Depression with emotional eating behaviors Sean [name] mood is stable on  Wellbutrin, but he is still eating emotionally and is not working on meal planning or prepping. Sean [name] struggles with emotional eating and using food for comfort to the extent that it is negatively impacting his health. He often snacks when he is not hungry. Sean [name] sometimes feels he is out of control and then feels guilty that he made poor food choices. He has been working on behavior modification techniques to help reduce his emotional eating and has been somewhat successful. He shows no sign of suicidal or homicidal ideations.  Depression screen Sean [name] 2/9 09/05/2016 12/02/2015  Decreased Interest 3 0  Down, Depressed, Hopeless 2 0  PHQ - 2 Score 5 0  Altered sleeping 3 -  Tired, decreased energy 3 -  Change in appetite 3 -  Feeling bad or failure about yourself  2 -  Moving slowly or fidgety/restless 0 -  Suicidal thoughts 0 -  PHQ-9 Score 16 -     ALLERGIES: No Known Allergies  MEDICATIONS: Current Outpatient Medications on File Prior to Visit  Medication Sig Dispense Refill  . amLODipine (NORVASC) 10 MG tablet Take 1 tablet (10 mg total) by mouth daily. 30 tablet 0  . calcium-vitamin [name] 250-100 MG-UNIT tablet Take 1 tablet by mouth 2 (two) times daily.    . celecoxib (CELEBREX) 200 MG capsule Take 1 capsule (200 mg total) by mouth 2 (two) times daily. 30 capsule 5  . Glucosamine-Chondroitin-MSM (TRIPLE FLEX  PO) Take by mouth.    Marland Kitchen. ibuprofen (ADVIL,MOTRIN) 800 MG tablet Take 1 tablet (800 mg total) by mouth 3 (three) times daily. 21 tablet 0  . Liraglutide -Weight Management (SAXENDA) 18 MG/3ML SOPN Inject 3 mg into the skin daily.    Marland Kitchen. losartan (COZAAR) 100 MG tablet Take 1 tablet (100 mg total) by mouth daily. 90 tablet 1  . potassium chloride SA (K-DUR,KLOR-CON) 20 MEQ tablet Take 1 tablet (20 mEq total) by mouth daily. 30 tablet 3  . Turmeric 500 MG TABS Take by mouth.     No current facility-administered medications on file prior to visit.     PAST MEDICAL HISTORY: Past Medical  History:  Diagnosis Date  . Heartburn   . Hyperlipidemia   . Hypertension   . Sleep apnea   . SOB (shortness of breath) on exertion   . Swelling    feet and legs    PAST SURGICAL HISTORY: No past surgical history on file.  SOCIAL HISTORY: Social History   Tobacco Use  . Smoking status: Never Smoker  . Smokeless tobacco: Never Used  Substance Use Topics  . Alcohol use: Yes  . Drug use: No    FAMILY HISTORY: Family History  Problem Relation Age of Onset  . Heart disease Father 7256       MI  . Hypertension Father   . Sudden death Father        Heart disease  . Hyperlipidemia Father   . Obesity Father   . Diabetes Mother        Borderline  . Hyperlipidemia Mother   . Hypertension Mother   . Depression Mother   . Obesity Mother   . Breast cancer Maternal Grandmother     ROS: Review of Systems  Constitutional: Positive for weight loss.  Cardiovascular: Negative for chest pain.  Gastrointestinal: Negative for nausea and vomiting.  Musculoskeletal: Negative for myalgias.       Negative for muscle weakness  Endo/Heme/Allergies:       Positive for polyphagia Negative for hypoglycemia  Psychiatric/Behavioral: Positive for depression. Negative for suicidal ideas.    PHYSICAL EXAM: Blood pressure 121/78, pulse 83, temperature 98.3 F (36.8 C), temperature source Oral, height 5\' 11"  (1.803 m), weight (!) 392 lb (177.8 kg), SpO2 98 %. Body mass index is 54.67 kg/m. Physical Exam  Constitutional: He is oriented to person, place, and time. He appears well-developed and well-nourished.  Cardiovascular: Normal rate.  Pulmonary/Chest: Effort normal.  Musculoskeletal: Normal range of motion.  Neurological: He is oriented to person, place, and time.  Skin: Skin is warm and dry.  Psychiatric: He has a normal mood and affect. His behavior is normal.  Vitals reviewed.   RECENT LABS AND TESTS: BMET    Component Value Date/Time   NA 139 08/17/2017 1706   NA 139  02/15/2017 0834   K 3.8 08/17/2017 1706   CL 100 08/17/2017 1706   CO2 28 08/17/2017 1706   GLUCOSE 85 08/17/2017 1706   BUN 11 08/17/2017 1706   BUN 14 02/15/2017 0834   CREATININE 0.89 08/17/2017 1706   CALCIUM 9.5 08/17/2017 1706   GFRNONAA >60 08/07/2017 0838   GFRAA >60 08/07/2017 0838   Lab Results  Component Value Date   HGBA1C 5.5 02/15/2017   HGBA1C 5.7 (H) 09/05/2016   Lab Results  Component Value Date   INSULIN 36.9 (H) 02/15/2017   INSULIN 29.3 (H) 09/05/2016   CBC    Component Value Date/Time  WBC 10.9 (H) 08/22/2017 1445   RBC 5.00 08/22/2017 1445   HGB 14.5 08/22/2017 1445   HGB 15.2 09/05/2016 1010   HCT 42.2 08/22/2017 1445   HCT 44.5 09/05/2016 1010   PLT 300.0 08/22/2017 1445   PLT 249 09/05/2016 1010   MCV 84.5 08/22/2017 1445   MCV 83 09/05/2016 1010   MCH 28.5 08/17/2017 1706   MCHC 34.4 08/22/2017 1445   RDW 13.7 08/22/2017 1445   RDW 14.6 09/05/2016 1010   LYMPHSABS 2.9 08/22/2017 1445   LYMPHSABS 2.8 09/05/2016 1010   MONOABS 0.9 08/22/2017 1445   EOSABS 0.3 08/22/2017 1445   EOSABS 0.5 (H) 09/05/2016 1010   BASOSABS 0.1 08/22/2017 1445   BASOSABS 0.1 09/05/2016 1010   Iron/TIBC/Ferritin/ %Sat No results found for: IRON, TIBC, FERRITIN, IRONPCTSAT Lipid Panel     Component Value Date/Time   CHOL 187 02/15/2017 0834   TRIG 100 02/15/2017 0834   HDL 37 (L) 02/15/2017 0834   CHOLHDL 4 03/20/2016 1708   VLDL 39.0 03/20/2016 1708   LDLCALC 130 (H) 02/15/2017 0834   LDLDIRECT 189.0 04/22/2015 1615   Hepatic Function Panel     Component Value Date/Time   PROT 7.1 08/17/2017 1706   PROT 7.4 02/15/2017 0834   ALBUMIN 4.0 08/07/2017 0838   ALBUMIN 4.7 02/15/2017 0834   AST 16 08/17/2017 1706   ALT 25 08/17/2017 1706   ALKPHOS 58 08/07/2017 0838   BILITOT 0.5 08/17/2017 1706   BILITOT 0.7 02/15/2017 0834   BILIDIR 0.1 02/19/2014 1048      Component Value Date/Time   TSH 2.210 09/05/2016 1010   TSH 0.44 10/27/2013 1103   TSH  1.17 11/22/2010 1447   Results for Sean GraffBEAMAN, Sean [name] (MRN [id number]) as of 09/17/2017 14:44  Ref. Range 09/13/2017 11:13  Vitamin [name], 25-Hydroxy Latest Ref Range: 30.0 - 100.0 ng/mL 42.6   ASSESSMENT AND PLAN: Other hyperlipidemia - Plan: atorvastatin (LIPITOR) 20 MG tablet  Vitamin [name] deficiency - Plan: Vitamin [name], Ergocalciferol, (DRISDOL) 50000 units CAPS capsule, VITAMIN [name] 25 Hydroxy (Vit-[name] Deficiency, Fractures)  Essential hypertension - Plan: chlorthalidone (HYGROTON) 25 MG tablet  Prediabetes - Plan: metFORMIN (GLUCOPHAGE) 500 MG tablet  Other depression - with emotional eating - Plan: buPROPion (WELLBUTRIN SR) 200 MG 12 hr tablet  Class 3 severe obesity with serious comorbidity and body mass index (BMI) of 50.0 to 59.9 in adult, unspecified obesity type (HCC)  PLAN:  Vitamin [name] Deficiency Sean [name] was informed that low vitamin [name] levels contributes to fatigue and are associated with obesity, breast, and colon cancer. He agrees to continue to take prescription Vit [name] @50 ,000 IU every week #4 with no refills. We will check labs and will follow up for routine testing of vitamin [name], at least 2-3 times per year. He was informed of the risk of over-replacement of vitamin [name] and agrees to not increase his dose unless he discusses this with us first. Sean [name] agrees to follow up as directed.  Hyperlipidemia Sean [name] was informed of the American Heart Association Guidelines emphasizing intensive lifestyle modifications as the first line treatment for hyperlipidemia. We discussed many lifestyle modifications today in depth, and Sean [name] will continue to work on decreasing saturated fats such as fatty red meat, butter and many fried foods. He will also increase vegetables and lean protein in his diet and continue to work on exercise and weight loss efforts. Sean [name] agrees to continue Lipitor 20 mg qd #30 with no refills and follow up as directed.  Hypertension We  discussed sodium restriction, working on healthy weight loss,  and a regular exercise program as the means to achieve improved blood pressure control. Sean [name] agreed with this plan and agreed to follow up as directed. We will continue to monitor his blood pressure as well as his progress with the above lifestyle modifications. He agrees to continue chlorthalidone 25 mg qd #30 with no refills and will watch for signs of hypotension as he continues his lifestyle modifications.  Pre-Diabetes Sean [name] will continue to work on weight loss, exercise, and decreasing simple carbohydrates in his diet to help decrease the risk of diabetes. We dicussed metformin including benefits and risks. He was informed that eating too many simple carbohydrates or too many calories at one sitting increases the likelihood of GI side effects. Sean [name] requested metformin for now and a prescription was written today of 1 month refill. Sean [name] agreed to continue [place]  For as long as the samples last. Sean [name] agreed to follow up with [name] as directed to monitor his progress.  Depression with Emotional Eating Behaviors We discussed behavior modification techniques today to help Sean [name] deal with his emotional eating and depression. He has agreed to continue Wellbutrin SR 200 mg daily #30 with no refills and follow up as directed.  Obesity Sean [name] is currently in the action stage of change. As such, his goal is to continue with weight loss efforts He has agreed to follow the Category 3 plan Sean [name] has been instructed to work up to a goal of 150 minutes of combined cardio and strengthening exercise per week for weight loss and overall health benefits. We discussed the following Behavioral Modification Strategies today: better snacking choices, planning for success, work on meal planning and easy cooking plans and ways to avoid night time snacking  Sean [name] is to work on decreasing volume, as following a structured plan is difficult for him. We discussed various medication options to help Sean [name] with his weight loss efforts  and we both agreed to continue Saxenda at 0.6 mg and will continue getting samples from Sean [name].  Sean [name] has agreed to follow up with our clinic in 3 to 4 weeks. He was informed of the importance of frequent follow up visits to maximize his success with intensive lifestyle modifications for his multiple health conditions.   OBESITY BEHAVIORAL INTERVENTION VISIT  Today's visit was # 19 out of 22.  Starting weight: 416 lbs Starting date: 09/05/16 Today's weight : 392 lbs  Today's date: 09/13/2017 Total lbs lost to date: 24    ASK: We discussed the diagnosis of obesity with Sean [name] today and Tushar agreed to give [name] permission to discuss obesity behavioral modification therapy today.  ASSESS: Deunta has the diagnosis of obesity and his BMI today is 54.7 Banks is in the action stage of change   ADVISE: Lonie was educated on the multiple health risks of obesity as well as the benefit of weight loss to improve his health. He was advised of the need for long term treatment and the importance of lifestyle modifications.  AGREE: Multiple dietary modification options and treatment options were discussed and  Hiroki agreed to the above obesity treatment plan.  I, [place] Crane, am acting as transcriptionist for Quillian Quince, MD  I have reviewed the above documentation for accuracy and completeness, and I agree with the above. -Quillian Quince, MD

## 2017-10-04 ENCOUNTER — Ambulatory Visit (INDEPENDENT_AMBULATORY_CARE_PROVIDER_SITE_OTHER): Payer: PRIVATE HEALTH INSURANCE | Admitting: Family Medicine

## 2017-10-04 ENCOUNTER — Encounter (INDEPENDENT_AMBULATORY_CARE_PROVIDER_SITE_OTHER): Payer: Self-pay | Admitting: Family Medicine

## 2017-10-04 VITALS — BP 118/76 | HR 93 | Temp 98.5°F | Ht 73.0 in | Wt 386.0 lb

## 2017-10-04 DIAGNOSIS — Z6841 Body Mass Index (BMI) 40.0 and over, adult: Secondary | ICD-10-CM

## 2017-10-04 DIAGNOSIS — E559 Vitamin D deficiency, unspecified: Secondary | ICD-10-CM

## 2017-10-04 DIAGNOSIS — I10 Essential (primary) hypertension: Secondary | ICD-10-CM | POA: Diagnosis not present

## 2017-10-04 DIAGNOSIS — Z9189 Other specified personal risk factors, not elsewhere classified: Secondary | ICD-10-CM

## 2017-10-04 DIAGNOSIS — E7849 Other hyperlipidemia: Secondary | ICD-10-CM

## 2017-10-04 DIAGNOSIS — F3289 Other specified depressive episodes: Secondary | ICD-10-CM

## 2017-10-04 DIAGNOSIS — R7303 Prediabetes: Secondary | ICD-10-CM | POA: Diagnosis not present

## 2017-10-04 MED ORDER — CHLORTHALIDONE 25 MG PO TABS: 25.0000 mg | ORAL_TABLET | Freq: Every day | ORAL | 0 refills | Status: DC

## 2017-10-04 MED ORDER — ATORVASTATIN CALCIUM 20 MG PO TABS: 20.0000 mg | ORAL_TABLET | Freq: Every day | ORAL | 0 refills | Status: DC

## 2017-10-04 MED ORDER — METFORMIN HCL 500 MG PO TABS: 500.0000 mg | ORAL_TABLET | Freq: Two times a day (BID) | ORAL | 0 refills | Status: DC

## 2017-10-04 MED ORDER — BUPROPION HCL ER (SR) 200 MG PO TB12: 200.0000 mg | ORAL_TABLET | Freq: Every day | ORAL | 0 refills | Status: DC

## 2017-10-04 MED ORDER — VITAMIN D (ERGOCALCIFEROL) 1.25 MG (50000 UNIT) PO CAPS: 50000.0000 [IU] | ORAL_CAPSULE | ORAL | 0 refills | Status: DC

## 2017-10-09 NOTE — Progress Notes (Signed)
Office: [phone]  /  Fax: [phone]   HPI:   Chief Complaint: OBESITY Sean [name] is here to discuss his progress with his obesity treatment plan. He is on the Category 3 plan and is following his eating plan approximately 50 % of the time. He states he is doing cardio 30 to 45 minutes 3 times per week. Sean [name] is drinking an adequate amount of water (>64 ounces), but only in soda. He has no challenges with his category 3 plan and he denies any significant hunger and he has no significant cravings. Sean [name] is taking Saxenda 0.6 mg daily and he is controlling his portions. Sean [name] denies any side effects with Saxenda. His weight is (!) 386 lb (175.1 kg) today and has had a weight loss of 6 pounds over a period of 3 weeks since his last visit. He has lost 30 lbs since starting treatment with [name].  Vitamin D deficiency Sean [name] has a diagnosis of vitamin D deficiency. He is currently taking vit D and denies nausea, vomiting or muscle weakness.  Pre-Diabetes Sean [name] has a diagnosis of prediabetes based on his elevated Hgb A1c and was informed this puts him at greater risk of developing diabetes. He noticed decreased hunger with Saxenda and he is also taking Metformin. Sean [name] continues to work on diet and exercise to decrease risk of diabetes. He denies nausea or hypoglycemia.  At risk for diabetes Sean [name] is at higher than average risk for developing diabetes due to his obesity and prediabetes. He currently denies polyuria or polydipsia.  Hyperlipidemia Sean [name] has hyperlipidemia and has been trying to improve his cholesterol levels with intensive lifestyle modification including a low saturated fat diet, exercise and weight loss. Sean [name] has no side effects with Lipitor. He denies any chest pain, claudication or myalgias.  Hypertension Sean [name] is a 42 y.o. male with hypertension.  Sean [name] has no hypotension or dehydration. He denies chest pain or shortness of breath on exertion. Sean [name] is taking Chlorthalidone  and he denies any side effects. He is working weight loss to help control his blood pressure with the goal of decreasing his risk of heart attack and stroke. Sean [name] blood pressure is currently controlled.  Depression with emotional eating behaviors Sean [name] struggles with emotional eating and using food for comfort to the extent that it is negatively impacting his health. He often snacks when he is not hungry. Sean [name] sometimes feels he is out of control and then feels guilty that he made poor food choices. He has been able to say no to dessert. He has been working on behavior modification techniques to help reduce his emotional eating and has been somewhat successful. He shows no sign of suicidal or homicidal ideations.  Depression screen Sean [name] - Sean [name] 2/9 09/05/2016 12/02/2015  Decreased Interest 3 0  Down, Depressed, Hopeless 2 0  PHQ - 2 Score 5 0  Altered sleeping 3 -  Tired, decreased energy 3 -  Change in appetite 3 -  Feeling bad or failure about yourself  2 -  Moving slowly or fidgety/restless 0 -  Suicidal thoughts 0 -  PHQ-9 Score 16 -      ALLERGIES: No Known Allergies  MEDICATIONS: Current Outpatient Medications on File Prior to Visit  Medication Sig Dispense Refill  . amLODipine (NORVASC) 10 MG tablet Take 1 tablet (10 mg total) by mouth daily. 30 tablet 0  . calcium-vitamin D 250-100 MG-UNIT tablet Take 1 tablet by mouth 2 (two) times daily.    . celecoxib (CELEBREX) 200  MG capsule Take 1 capsule (200 mg total) by mouth 2 (two) times daily. 30 capsule 5  . Glucosamine-Chondroitin-MSM (TRIPLE FLEX PO) Take by mouth.    Marland Kitchen ibuprofen (ADVIL,MOTRIN) 800 MG tablet Take 1 tablet (800 mg total) by mouth 3 (three) times daily. 21 tablet 0  . Liraglutide -Weight Management (SAXENDA) 18 MG/3ML SOPN Inject 3 mg into the skin daily.    Marland Kitchen losartan (COZAAR) 100 MG tablet Take 1 tablet (100 mg total) by mouth daily. 90 tablet 1  . potassium chloride SA (K-DUR,KLOR-CON) 20 MEQ tablet Take 1 tablet (20  mEq total) by mouth daily. 30 tablet 3  . Turmeric 500 MG TABS Take by mouth.     No current facility-administered medications on file prior to visit.     PAST MEDICAL HISTORY: Past Medical History:  Diagnosis Date  . Heartburn   . Hyperlipidemia   . Hypertension   . Sleep apnea   . SOB (shortness of breath) on exertion   . Swelling    feet and legs    PAST SURGICAL HISTORY: No past surgical history on file.  SOCIAL HISTORY: Social History   Tobacco Use  . Smoking status: Never Smoker  . Smokeless tobacco: Never Used  Substance Use Topics  . Alcohol use: Yes  . Drug use: No    FAMILY HISTORY: Family History  Problem Relation Age of Onset  . Heart disease Father 56       MI  . Hypertension Father   . Sudden death Father        Heart disease  . Hyperlipidemia Father   . Obesity Father   . Diabetes Mother        Borderline  . Hyperlipidemia Mother   . Hypertension Mother   . Depression Mother   . Obesity Mother   . Breast cancer Maternal Grandmother     ROS: Review of Systems  Constitutional: Positive for weight loss.  Respiratory: Negative for shortness of breath (on exertion).   Cardiovascular: Negative for chest pain and claudication.  Gastrointestinal: Negative for nausea and vomiting.  Genitourinary: Negative for frequency.  Musculoskeletal: Negative for myalgias.       Negative for muscle weakness  Endo/Heme/Allergies: Negative for polydipsia.       Positive for polyphagia Negative for hypoglycemia  Psychiatric/Behavioral: Positive for depression. Negative for suicidal ideas.    PHYSICAL EXAM: Blood pressure 118/76, pulse 93, temperature 98.5 F (36.9 C), temperature source Oral, height 6\' 1"  (1.854 m), weight (!) 386 lb (175.1 kg), SpO2 99 %. Body mass index is 50.93 kg/m. Physical Exam  Constitutional: He is oriented to person, place, and time. He appears well-developed and well-nourished.  Cardiovascular: Normal rate.  Pulmonary/Chest:  Effort normal.  Musculoskeletal: Normal range of motion.  Neurological: He is oriented to person, place, and time.  Skin: Skin is warm and dry.  Psychiatric: He has a normal mood and affect. His behavior is normal.  Vitals reviewed.   RECENT LABS AND TESTS: BMET    Component Value Date/Time   NA 139 08/17/2017 1706   NA 139 02/15/2017 0834   K 3.8 08/17/2017 1706   CL 100 08/17/2017 1706   CO2 28 08/17/2017 1706   GLUCOSE 85 08/17/2017 1706   BUN 11 08/17/2017 1706   BUN 14 02/15/2017 0834   CREATININE 0.89 08/17/2017 1706   CALCIUM 9.5 08/17/2017 1706   GFRNONAA >60 08/07/2017 0838   GFRAA >60 08/07/2017 7829   Lab Results  Component Value Date  HGBA1C 5.5 02/15/2017   HGBA1C 5.7 (H) 09/05/2016   Lab Results  Component Value Date   INSULIN 36.9 (H) 02/15/2017   INSULIN 29.3 (H) 09/05/2016   CBC    Component Value Date/Time   WBC 10.9 (H) 08/22/2017 1445   RBC 5.00 08/22/2017 1445   HGB 14.5 08/22/2017 1445   HGB 15.2 09/05/2016 1010   HCT 42.2 08/22/2017 1445   HCT 44.5 09/05/2016 1010   PLT 300.0 08/22/2017 1445   PLT 249 09/05/2016 1010   MCV 84.5 08/22/2017 1445   MCV 83 09/05/2016 1010   MCH 28.5 08/17/2017 1706   MCHC 34.4 08/22/2017 1445   RDW 13.7 08/22/2017 1445   RDW 14.6 09/05/2016 1010   LYMPHSABS 2.9 08/22/2017 1445   LYMPHSABS 2.8 09/05/2016 1010   MONOABS 0.9 08/22/2017 1445   EOSABS 0.3 08/22/2017 1445   EOSABS 0.5 (H) 09/05/2016 1010   BASOSABS 0.1 08/22/2017 1445   BASOSABS 0.1 09/05/2016 1010   Iron/TIBC/Ferritin/ %Sat No results found for: IRON, TIBC, FERRITIN, IRONPCTSAT Lipid Panel     Component Value Date/Time   CHOL 187 02/15/2017 0834   TRIG 100 02/15/2017 0834   HDL 37 (L) 02/15/2017 0834   CHOLHDL 4 03/20/2016 1708   VLDL 39.0 03/20/2016 1708   LDLCALC 130 (H) 02/15/2017 0834   LDLDIRECT 189.0 04/22/2015 1615   Hepatic Function Panel     Component Value Date/Time   PROT 7.1 08/17/2017 1706   PROT 7.4 02/15/2017  0834   ALBUMIN 4.0 08/07/2017 0838   ALBUMIN 4.7 02/15/2017 0834   AST 16 08/17/2017 1706   ALT 25 08/17/2017 1706   ALKPHOS 58 08/07/2017 0838   BILITOT 0.5 08/17/2017 1706   BILITOT 0.7 02/15/2017 0834   BILIDIR 0.1 02/19/2014 1048      Component Value Date/Time   TSH 2.210 09/05/2016 1010   TSH 0.44 10/27/2013 1103   TSH 1.17 11/22/2010 1447   Results for Sean [name], Sean [name] (MRN [id number]) as of 10/09/2017 11:31  Ref. Range 09/13/2017 11:13  Vitamin D, 25-Hydroxy Latest Ref Range: 30.0 - 100.0 ng/mL 42.6   ASSESSMENT AND PLAN: Vitamin D deficiency - Plan: Vitamin D, Ergocalciferol, (DRISDOL) 50000 units CAPS capsule  Prediabetes - Plan: metFORMIN (GLUCOPHAGE) 500 MG tablet  Other hyperlipidemia - Plan: atorvastatin (LIPITOR) 20 MG tablet  Essential hypertension - Plan: chlorthalidone (HYGROTON) 25 MG tablet  Other depression - with emotional eating - Plan: buPROPion (WELLBUTRIN SR) 200 MG 12 hr tablet  At risk for diabetes mellitus  Class 3 severe obesity with serious comorbidity and body mass index (BMI) of 50.0 to 59.9 in adult, unspecified obesity type (HCC)  PLAN:  Vitamin D Deficiency Sean [name] was informed that low vitamin D levels contributes to fatigue and are associated with obesity, breast, and colon cancer. He agrees to continue to take prescription Vit D @50 ,000 IU every week #4 with no refills and will follow up for routine testing of vitamin D, at least 2-3 times per year. He was informed of the risk of over-replacement of vitamin D and agrees to not increase his dose unless he discusses this with [name] first. Yuvraj agrees to follow up as directed.  Pre-Diabetes Sean [name] will continue to work on weight loss, exercise, and decreasing simple carbohydrates in his diet to help decrease the risk of diabetes. We dicussed metformin including benefits and risks. He was informed that eating too many simple carbohydrates or too many calories at one sitting increases the likelihood of GI  side effects.  Sean [name] agrees to take metformin 500 mg BID #60 with no refills and continue Saxenda per his PCP. Sean [name] agreed to follow up with [name] as directed to monitor his progress.  Diabetes risk counseling Sean [name] was given extended (15 minutes) diabetes prevention counseling today. He is 42 y.o. male and has risk factors for diabetes including obesity and prediabetes. We discussed intensive lifestyle modifications today with an emphasis on weight loss as well as increasing exercise and decreasing simple carbohydrates in his diet.  Hyperlipidemia Sean [name] was informed of the American Heart Association Guidelines emphasizing intensive lifestyle modifications as the first line treatment for hyperlipidemia. We discussed many lifestyle modifications today in depth, and Sean [name] will continue to work on decreasing saturated fats such as fatty red meat, butter and many fried foods. He will also increase vegetables and lean protein in his diet and continue to work on exercise and weight loss efforts. Azazel will continue Atorvastatin 20 mg daily #30 with no refills and follow up as directed.  Hypertension We discussed sodium restriction, working on healthy weight loss, and a regular exercise program as the means to achieve improved blood pressure control. [name] agreed with this plan and agreed to follow up as directed. We will continue to monitor his blood pressure as well as his progress with the above lifestyle modifications. He agrees to continue Chlorthalidone 25 mg daily #30 with no refills and will watch for signs of hypotension as he continues his lifestyle modifications.  Depression with Emotional Eating Behaviors We discussed behavior modification techniques today to help Ahman deal with his emotional eating and depression. He has agreed to continue Wellbutrin SR 200 mg daily at 12:00 Noon #30 with no refills and follow up as directed.  Obesity Mcguire is currently in the action stage of change. As such, his goal is  to continue with weight loss efforts He has agreed to follow the Category 3 plan and stressed with Argelio to have protein with each meal and no meal skipping. Colson has been instructed to work up to a goal of 150 minutes of combined cardio and strengthening exercise per week or work outs 3 days per week 75 minutes to 1 hour of cardio and resistance for weight loss and overall health benefits. We discussed the following Behavioral Modification Strategies today: increase H2O intake, no skipping meals, increasing lean protein intake, decreasing simple carbohydrates  and decrease eating out  Ozias has agreed to follow up with our clinic in 3 weeks. He was informed of the importance of frequent follow up visits to maximize his success with intensive lifestyle modifications for his multiple health conditions.   OBESITY BEHAVIORAL INTERVENTION VISIT  Today's visit was # 20   Starting weight: 416 lbs Starting date: 09/05/16 Today's weight : 386 lbs Today's date: 10/04/17 Total lbs lost to date: 30   ASK: We discussed the diagnosis of obesity with Sean [name] today and Bostyn agreed to give [name] permission to discuss obesity behavioral modification therapy today.  ASSESS: Rees has the diagnosis of obesity and his BMI today is 50.94 Ayaansh is in the action stage of change   ADVISE: Reedy was educated on the multiple health risks of obesity as well as the benefit of weight loss to improve his health. He was advised of the need for long term treatment and the importance of lifestyle modifications to improve his current health and to decrease his risk of future health problems.  AGREE: Multiple dietary modification options and treatment options were discussed and  Silver agreed to follow the recommendations documented in the above note.  ARRANGE: Mearl was educated on the importance of frequent visits to treat obesity as outlined per CMS and USPSTF guidelines and agreed to schedule his next follow up  appointment today.  Cristi Loron, am acting as Energy manager for [employer]. Manson Passey, DO  I have reviewed the above documentation for accuracy and completeness, and I agree with the above. -Quillian Quince, MD

## 2017-10-30 ENCOUNTER — Ambulatory Visit (INDEPENDENT_AMBULATORY_CARE_PROVIDER_SITE_OTHER): Payer: PRIVATE HEALTH INSURANCE | Admitting: Family Medicine

## 2017-10-30 VITALS — BP 123/68 | HR 82 | Temp 98.2°F | Ht 73.0 in | Wt 387.0 lb

## 2017-10-30 DIAGNOSIS — E559 Vitamin D deficiency, unspecified: Secondary | ICD-10-CM | POA: Diagnosis not present

## 2017-10-30 DIAGNOSIS — E876 Hypokalemia: Secondary | ICD-10-CM

## 2017-10-30 DIAGNOSIS — I10 Essential (primary) hypertension: Secondary | ICD-10-CM

## 2017-10-30 DIAGNOSIS — F3289 Other specified depressive episodes: Secondary | ICD-10-CM | POA: Diagnosis not present

## 2017-10-30 DIAGNOSIS — Z9189 Other specified personal risk factors, not elsewhere classified: Secondary | ICD-10-CM | POA: Diagnosis not present

## 2017-10-30 DIAGNOSIS — Z6841 Body Mass Index (BMI) 40.0 and over, adult: Secondary | ICD-10-CM

## 2017-10-30 MED ORDER — AMLODIPINE BESYLATE 10 MG PO TABS: 10.0000 mg | ORAL_TABLET | Freq: Every day | ORAL | 0 refills | Status: DC

## 2017-10-30 MED ORDER — BUPROPION HCL ER (SR) 200 MG PO TB12: 200.0000 mg | ORAL_TABLET | Freq: Every day | ORAL | 0 refills | Status: DC

## 2017-10-30 MED ORDER — POTASSIUM CHLORIDE CRYS ER 20 MEQ PO TBCR: 20.0000 meq | EXTENDED_RELEASE_TABLET | Freq: Every day | ORAL | 3 refills | Status: DC

## 2017-10-30 MED ORDER — VITAMIN D (ERGOCALCIFEROL) 1.25 MG (50000 UNIT) PO CAPS: 50000.0000 [IU] | ORAL_CAPSULE | ORAL | 0 refills | Status: DC

## 2017-10-31 ENCOUNTER — Other Ambulatory Visit (INDEPENDENT_AMBULATORY_CARE_PROVIDER_SITE_OTHER): Payer: Self-pay

## 2017-10-31 DIAGNOSIS — I10 Essential (primary) hypertension: Secondary | ICD-10-CM

## 2017-10-31 MED ORDER — LOSARTAN POTASSIUM 100 MG PO TABS: 100.0000 mg | ORAL_TABLET | Freq: Every day | ORAL | 0 refills | Status: DC

## 2017-10-31 MED ORDER — AMLODIPINE BESYLATE 10 MG PO TABS: 10.0000 mg | ORAL_TABLET | Freq: Every day | ORAL | 0 refills | Status: DC

## 2017-10-31 NOTE — Progress Notes (Signed)
Office: [phone]  /  Fax: [phone]   HPI:   Chief Complaint: OBESITY Sean [name] is here to discuss his progress with his obesity treatment plan. He is on the Category 3 plan and is following his eating plan approximately 50 % of the time. He states he is exercising 0 minutes 0 times per week. Sean [name] has done well maintaining his weight in the last few weeks. He hasn't been feeling well, and he has found it difficult to eat all of his protein on the Category 3 plan. His weight is (!) 387 lb (175.5 kg) today and has not lost weight since his last visit. He has lost 29 lbs since starting treatment with [name].  Hypertension Sean [name] is a 42 y.o. male with hypertension. Sean [name] denies chest pain or headache. He is working on diet and weight loss to help control his blood pressure with the goal of decreasing his risk of heart attack and stroke. Sean [name] blood pressure is controlled today.  At risk for cardiovascular disease Sean [name] is at a higher than average risk for cardiovascular disease due to obesity and hypertension. He currently denies any chest pain.  Vitamin D deficiency Sean [name] has a diagnosis of vitamin D deficiency. Sean [name] is stable on vit D, but he is not yet at goal. He denies nausea, vomiting or muscle weakness.  Hypokalemia Sean [name] has a diagnosis of hypokalemia and is currently on chlorthalidone. His last potassium level was within normal limits.  Depression with emotional eating behaviors Sean [name] is struggling with emotional eating and using food for comfort to the extent that it is negatively impacting his health. He often snacks when he is not hungry. Sean [name] sometimes feels he is out of control and then feels guilty that he made poor food choices. He has been working on behavior modification techniques to help reduce his emotional eating and has been somewhat successful. His mood is stable on Wellbutrin and his blood pressure is stable. He denies insomnia and he shows no sign of  suicidal or homicidal ideations.  Depression screen Sean [name] 2/9 09/05/2016 12/02/2015  Decreased Interest 3 0  Down, Depressed, Hopeless 2 0  PHQ - 2 Score 5 0  Altered sleeping 3 -  Tired, decreased energy 3 -  Change in appetite 3 -  Feeling bad or failure about yourself  2 -  Moving slowly or fidgety/restless 0 -  Suicidal thoughts 0 -  PHQ-9 Score 16 -     ALLERGIES: No Known Allergies  MEDICATIONS: Current Outpatient Medications on File Prior to Visit  Medication Sig Dispense Refill  . atorvastatin (LIPITOR) 20 MG tablet Take 1 tablet (20 mg total) by mouth daily. 30 tablet 0  . calcium-vitamin D 250-100 MG-UNIT tablet Take 1 tablet by mouth 2 (two) times daily.    . celecoxib (CELEBREX) 200 MG capsule Take 1 capsule (200 mg total) by mouth 2 (two) times daily. 30 capsule 5  . chlorthalidone (HYGROTON) 25 MG tablet Take 1 tablet (25 mg total) by mouth daily. 30 tablet 0  . Glucosamine-Chondroitin-MSM (TRIPLE FLEX PO) Take by mouth.    Marland Kitchen ibuprofen (ADVIL,MOTRIN) 800 MG tablet Take 1 tablet (800 mg total) by mouth 3 (three) times daily. 21 tablet 0  . Liraglutide -Weight Management (SAXENDA) 18 MG/3ML SOPN Inject 3 mg into the skin daily.    Marland Kitchen losartan (COZAAR) 100 MG tablet Take 1 tablet (100 mg total) by mouth daily. 90 tablet 1  . metFORMIN (GLUCOPHAGE) 500 MG tablet Take 1  tablet (500 mg total) by mouth 2 (two) times daily with a meal. 60 tablet 0  . Turmeric 500 MG TABS Take by mouth.     No current facility-administered medications on file prior to visit.     PAST MEDICAL HISTORY: Past Medical History:  Diagnosis Date  . Heartburn   . Hyperlipidemia   . Hypertension   . Sleep apnea   . SOB (shortness of breath) on exertion   . Swelling    feet and legs    PAST SURGICAL HISTORY: No past surgical history on file.  SOCIAL HISTORY: Social History   Tobacco Use  . Smoking status: Never Smoker  . Smokeless tobacco: Never Used  Substance Use Topics  . Alcohol  use: Yes  . Drug use: No    FAMILY HISTORY: Family History  Problem Relation Age of Onset  . Heart disease Father 41       MI  . Hypertension Father   . Sudden death Father        Heart disease  . Hyperlipidemia Father   . Obesity Father   . Diabetes Mother        Borderline  . Hyperlipidemia Mother   . Hypertension Mother   . Depression Mother   . Obesity Mother   . Breast cancer Maternal Grandmother     ROS: Review of Systems  Constitutional: Negative for weight loss.  Cardiovascular: Negative for chest pain.  Gastrointestinal: Negative for nausea and vomiting.  Musculoskeletal:       Negative for muscle weakness  Neurological: Negative for headaches.  Psychiatric/Behavioral: Positive for depression. Negative for suicidal ideas. The patient does not have insomnia.     PHYSICAL EXAM: Blood pressure 123/68, pulse 82, temperature 98.2 F (36.8 C), temperature source Oral, height 6\' 1"  (1.854 m), weight (!) 387 lb (175.5 kg), SpO2 98 %. Body mass index is 51.06 kg/m. Physical Exam  Constitutional: He is oriented to person, place, and time. He appears well-developed and well-nourished.  Cardiovascular: Normal rate.  Pulmonary/Chest: Effort normal.  Musculoskeletal: Normal range of motion.  Neurological: He is oriented to person, place, and time.  Skin: Skin is warm and dry.  Psychiatric: He has a normal mood and affect. His behavior is normal.  Vitals reviewed.   RECENT LABS AND TESTS: BMET    Component Value Date/Time   NA 139 08/17/2017 1706   NA 139 02/15/2017 0834   K 3.8 08/17/2017 1706   CL 100 08/17/2017 1706   CO2 28 08/17/2017 1706   GLUCOSE 85 08/17/2017 1706   BUN 11 08/17/2017 1706   BUN 14 02/15/2017 0834   CREATININE 0.89 08/17/2017 1706   CALCIUM 9.5 08/17/2017 1706   GFRNONAA >60 08/07/2017 0838   GFRAA >60 08/07/2017 0838   Lab Results  Component Value Date   HGBA1C 5.5 02/15/2017   HGBA1C 5.7 (H) 09/05/2016   Lab Results    Component Value Date   INSULIN 36.9 (H) 02/15/2017   INSULIN 29.3 (H) 09/05/2016   CBC    Component Value Date/Time   WBC 10.9 (H) 08/22/2017 1445   RBC 5.00 08/22/2017 1445   HGB 14.5 08/22/2017 1445   HGB 15.2 09/05/2016 1010   HCT 42.2 08/22/2017 1445   HCT 44.5 09/05/2016 1010   PLT 300.0 08/22/2017 1445   PLT 249 09/05/2016 1010   MCV 84.5 08/22/2017 1445   MCV 83 09/05/2016 1010   MCH 28.5 08/17/2017 1706   MCHC 34.4 08/22/2017 1445   RDW  13.7 08/22/2017 1445   RDW 14.6 09/05/2016 1010   LYMPHSABS 2.9 08/22/2017 1445   LYMPHSABS 2.8 09/05/2016 1010   MONOABS 0.9 08/22/2017 1445   EOSABS 0.3 08/22/2017 1445   EOSABS 0.5 (H) 09/05/2016 1010   BASOSABS 0.1 08/22/2017 1445   BASOSABS 0.1 09/05/2016 1010   Iron/TIBC/Ferritin/ %Sat No results found for: IRON, TIBC, FERRITIN, IRONPCTSAT Lipid Panel     Component Value Date/Time   CHOL 187 02/15/2017 0834   TRIG 100 02/15/2017 0834   HDL 37 (L) 02/15/2017 0834   CHOLHDL 4 03/20/2016 1708   VLDL 39.0 03/20/2016 1708   LDLCALC 130 (H) 02/15/2017 0834   LDLDIRECT 189.0 04/22/2015 1615   Hepatic Function Panel     Component Value Date/Time   PROT 7.1 08/17/2017 1706   PROT 7.4 02/15/2017 0834   ALBUMIN 4.0 08/07/2017 0838   ALBUMIN 4.7 02/15/2017 0834   AST 16 08/17/2017 1706   ALT 25 08/17/2017 1706   ALKPHOS 58 08/07/2017 0838   BILITOT 0.5 08/17/2017 1706   BILITOT 0.7 02/15/2017 0834   BILIDIR 0.1 02/19/2014 1048      Component Value Date/Time   TSH 2.210 09/05/2016 1010   TSH 0.44 10/27/2013 1103   TSH 1.17 11/22/2010 1447   Results for JOSUEL, KOEPPEN (MRN [id number]) as of 10/31/2017 10:37  Ref. Range 09/13/2017 11:13  Vitamin D, 25-Hydroxy Latest Ref Range: 30.0 - 100.0 ng/mL 42.6   ASSESSMENT AND PLAN: Essential hypertension - Plan: amLODipine (NORVASC) 10 MG tablet  Vitamin D deficiency - Plan: Vitamin D, Ergocalciferol, (DRISDOL) 50000 units CAPS capsule  Hypokalemia - Plan: potassium chloride  SA (K-DUR,KLOR-CON) 20 MEQ tablet  Other depression - with emotional eating - Plan: buPROPion (WELLBUTRIN SR) 200 MG 12 hr tablet  At risk for heart disease  Class 3 severe obesity with serious comorbidity and body mass index (BMI) of 50.0 to 59.9 in adult, unspecified obesity type (HCC)  PLAN:  Hypertension We discussed sodium restriction, working on healthy weight loss, and a regular exercise program as the means to achieve improved blood pressure control. [name] agreed with this plan and agreed to follow up as directed. We will continue to monitor his blood pressure as well as his progress with the above lifestyle modifications. He agrees to continue Norvasc 10 mg daily #30 with no refills and Cozaar 100 mg daily #30 with no refills and will watch for signs of hypotension as he continues his lifestyle modifications.  Cardiovascular risk counseling Ezechiel was given extended (15 minutes) coronary artery disease prevention counseling today. He is 42 y.o. male and has risk factors for heart disease including obesity and hypertension. We discussed intensive lifestyle modifications today with an emphasis on specific weight loss instructions and strategies. Pt was also informed of the importance of increasing exercise and decreasing saturated fats to help prevent heart disease.  Vitamin D Deficiency Sean [name] was informed that low vitamin D levels contributes to fatigue and are associated with obesity, breast, and colon cancer. He agrees to continue to take prescription Vit D @50 ,000 IU every week #4 with no refills and will follow up for routine testing of vitamin D, at least 2-3 times per year. He was informed of the risk of over-replacement of vitamin D and agrees to not increase his dose unless he discusses this with [name] first. Sean [name] agrees to follow up as directed.  Hypokalemia Sean [name] agrees to continue potassium 20 mEq daily #30 with no refills and follow up as directed.  Depression with  Emotional Eating  Behaviors We discussed behavior modification techniques today to help Sean [name] deal with his emotional eating and depression. He has agreed to continue Wellbutrin SR 200 mg qd #30 with no refills and follow up as directed.  Obesity Sean [name] is currently in the action stage of change. As such, his goal is to continue with weight loss efforts He has agreed to follow the Category 3 plan Sean [name] has been instructed to work up to a goal of 150 minutes of combined cardio and strengthening exercise per week for weight loss and overall health benefits. We discussed the following Behavioral Modification Strategies today: increasing lean protein intake, decreasing simple carbohydrates  and work on meal planning and easy cooking plans  Sean [name] has agreed to follow up with our clinic in 3 to 4 weeks. He was informed of the importance of frequent follow up visits to maximize his success with intensive lifestyle modifications for his multiple health conditions.   OBESITY BEHAVIORAL INTERVENTION VISIT  Today's visit was # 21   Starting weight: 416 lbs Starting date: 09/05/16 Today's weight : 387 lbs  Today's date: 10/30/2017 Total lbs lost to date: 46   ASK: We discussed the diagnosis of obesity with Sean [name] today and Jabbar agreed to give [name] permission to discuss obesity behavioral modification therapy today.  ASSESS: Zakye has the diagnosis of obesity and his BMI today is 51.07 Luay is in the action stage of change   ADVISE: Sean [name] was educated on the multiple health risks of obesity as well as the benefit of weight loss to improve his health. He was advised of the need for long term treatment and the importance of lifestyle modifications to improve his current health and to decrease his risk of future health problems.  AGREE: Multiple dietary modification options and treatment options were discussed and  Hamish agreed to follow the recommendations documented in the above note.  ARRANGE: Tayvian was educated on  the importance of frequent visits to treat obesity as outlined per CMS and USPSTF guidelines and agreed to schedule his next follow up appointment today.  I, [place] Crane, am acting as transcriptionist for Quillian Quince, MD  I have reviewed the above documentation for accuracy and completeness, and I agree with the above. -Quillian Quince, MD

## 2017-11-27 ENCOUNTER — Ambulatory Visit (INDEPENDENT_AMBULATORY_CARE_PROVIDER_SITE_OTHER): Payer: PRIVATE HEALTH INSURANCE | Admitting: Family Medicine

## 2017-11-27 VITALS — BP 121/77 | HR 76 | Temp 98.4°F | Ht 73.0 in | Wt 388.0 lb

## 2017-11-27 DIAGNOSIS — I10 Essential (primary) hypertension: Secondary | ICD-10-CM | POA: Diagnosis not present

## 2017-11-27 DIAGNOSIS — E559 Vitamin D deficiency, unspecified: Secondary | ICD-10-CM | POA: Diagnosis not present

## 2017-11-27 DIAGNOSIS — Z6841 Body Mass Index (BMI) 40.0 and over, adult: Secondary | ICD-10-CM

## 2017-11-27 DIAGNOSIS — E876 Hypokalemia: Secondary | ICD-10-CM

## 2017-11-27 MED ORDER — LOSARTAN POTASSIUM 100 MG PO TABS: 100.0000 mg | ORAL_TABLET | Freq: Every day | ORAL | 0 refills | Status: DC

## 2017-11-27 MED ORDER — VITAMIN D (ERGOCALCIFEROL) 1.25 MG (50000 UNIT) PO CAPS: 50000.0000 [IU] | ORAL_CAPSULE | ORAL | 0 refills | Status: DC

## 2017-11-27 MED ORDER — CHLORTHALIDONE 25 MG PO TABS: 25.0000 mg | ORAL_TABLET | Freq: Every day | ORAL | 0 refills | Status: DC

## 2017-11-27 MED ORDER — POTASSIUM CHLORIDE CRYS ER 20 MEQ PO TBCR: 20.0000 meq | EXTENDED_RELEASE_TABLET | Freq: Every day | ORAL | 3 refills | Status: DC

## 2017-11-27 MED ORDER — AMLODIPINE BESYLATE 10 MG PO TABS: 10.0000 mg | ORAL_TABLET | Freq: Every day | ORAL | 0 refills | Status: DC

## 2017-11-29 NOTE — Progress Notes (Signed)
Office: [phone]  /  Fax: [phone]   HPI:   Chief Complaint: OBESITY Sean [name] is here to discuss his progress with his obesity treatment plan. He is following the Category 3 plan and is following his eating plan approximately 25-50 % of the time. He states he is exercising 0 minutes 0 times per week. Sean [name] is struggling to stay on track with his plan. He has been sick recently and has increased simple carbs because this helped with his nausea.   His weight is (!) 388 lb (176 kg) today and has gained 1 lb since his last visit. He has lost 29 lbs since starting treatment with [name].  Hypertension Sean [name] is a 42 y.o. male with hypertension.  Sean [name] denies chest pain or shortness of breath on exertion. He is working weight loss to help control his blood pressure with the goal of decreasing his risk of heart attack and stroke. Sean [name] blood pressure is stable on medications.   Hypokalemia Sean [name] has a diagnosis of hypokalemia. He is on KCl and denies muscle cramping.   Vitamin D deficiency Sean [name] has a diagnosis of vitamin D deficiency. He is currently stable on prescription Vit D but he is not yet at goal. Sean [name] denies nausea, vomiting or muscle weakness.   ALLERGIES: No Known Allergies  MEDICATIONS: Current Outpatient Medications on File Prior to Visit  Medication Sig Dispense Refill  . atorvastatin (LIPITOR) 20 MG tablet Take 1 tablet (20 mg total) by mouth daily. 30 tablet 0  . buPROPion (WELLBUTRIN SR) 200 MG 12 hr tablet Take 1 tablet (200 mg total) by mouth daily at 12 noon. 30 tablet 0  . calcium-vitamin D 250-100 MG-UNIT tablet Take 1 tablet by mouth 2 (two) times daily.    . celecoxib (CELEBREX) 200 MG capsule Take 1 capsule (200 mg total) by mouth 2 (two) times daily. 30 capsule 5  . Glucosamine-Chondroitin-MSM (TRIPLE FLEX PO) Take by mouth.    Marland Kitchen ibuprofen (ADVIL,MOTRIN) 800 MG tablet Take 1 tablet (800 mg total) by mouth 3 (three) times daily. 21 tablet 0  .  Liraglutide -Weight Management (SAXENDA) 18 MG/3ML SOPN Inject 3 mg into the skin daily.    . metFORMIN (GLUCOPHAGE) 500 MG tablet Take 1 tablet (500 mg total) by mouth 2 (two) times daily with a meal. 60 tablet 0  . Turmeric 500 MG TABS Take by mouth.     No current facility-administered medications on file prior to visit.     PAST MEDICAL HISTORY: Past Medical History:  Diagnosis Date  . Heartburn   . Hyperlipidemia   . Hypertension   . Sleep apnea   . SOB (shortness of breath) on exertion   . Swelling    feet and legs    PAST SURGICAL HISTORY: No past surgical history on file.  SOCIAL HISTORY: Social History   Tobacco Use  . Smoking status: Never Smoker  . Smokeless tobacco: Never Used  Substance Use Topics  . Alcohol use: Yes  . Drug use: No    FAMILY HISTORY: Family History  Problem Relation Age of Onset  . Heart disease Father 64       MI  . Hypertension Father   . Sudden death Father        Heart disease  . Hyperlipidemia Father   . Obesity Father   . Diabetes Mother        Borderline  . Hyperlipidemia Mother   . Hypertension Mother   . Depression  Mother   . Obesity Mother   . Breast cancer Maternal Grandmother     ROS: Review of Systems  Constitutional: Negative for weight loss.  Respiratory: Negative for shortness of breath.   Cardiovascular: Negative for chest pain.  Gastrointestinal: Negative for nausea and vomiting.  Musculoskeletal:       Negative for muscle cramping Negative for muscle weakness    PHYSICAL EXAM: Blood pressure 121/77, pulse 76, temperature 98.4 F (36.9 C), temperature source Oral, height 6\' 1"  (1.854 m), weight (!) 388 lb (176 kg), SpO2 97 %. Body mass index is 51.19 kg/m. Physical Exam  Constitutional: He is oriented to person, place, and time. He appears well-developed and well-nourished.  Cardiovascular: Normal rate.  Pulmonary/Chest: Effort normal.  Musculoskeletal: Normal range of motion.  Neurological: He  is alert and oriented to person, place, and time.  Skin: Skin is warm and dry.  Psychiatric: He has a normal mood and affect. His behavior is normal.  Vitals reviewed.   RECENT LABS AND TESTS: BMET    Component Value Date/Time   NA 139 08/17/2017 1706   NA 139 02/15/2017 0834   K 3.8 08/17/2017 1706   CL 100 08/17/2017 1706   CO2 28 08/17/2017 1706   GLUCOSE 85 08/17/2017 1706   BUN 11 08/17/2017 1706   BUN 14 02/15/2017 0834   CREATININE 0.89 08/17/2017 1706   CALCIUM 9.5 08/17/2017 1706   GFRNONAA >60 08/07/2017 0838   GFRAA >60 08/07/2017 0838   Lab Results  Component Value Date   HGBA1C 5.5 02/15/2017   HGBA1C 5.7 (H) 09/05/2016   Lab Results  Component Value Date   INSULIN 36.9 (H) 02/15/2017   INSULIN 29.3 (H) 09/05/2016   CBC    Component Value Date/Time   WBC 10.9 (H) 08/22/2017 1445   RBC 5.00 08/22/2017 1445   HGB 14.5 08/22/2017 1445   HGB 15.2 09/05/2016 1010   HCT 42.2 08/22/2017 1445   HCT 44.5 09/05/2016 1010   PLT 300.0 08/22/2017 1445   PLT 249 09/05/2016 1010   MCV 84.5 08/22/2017 1445   MCV 83 09/05/2016 1010   MCH 28.5 08/17/2017 1706   MCHC 34.4 08/22/2017 1445   RDW 13.7 08/22/2017 1445   RDW 14.6 09/05/2016 1010   LYMPHSABS 2.9 08/22/2017 1445   LYMPHSABS 2.8 09/05/2016 1010   MONOABS 0.9 08/22/2017 1445   EOSABS 0.3 08/22/2017 1445   EOSABS 0.5 (H) 09/05/2016 1010   BASOSABS 0.1 08/22/2017 1445   BASOSABS 0.1 09/05/2016 1010   Iron/TIBC/Ferritin/ %Sat No results found for: IRON, TIBC, FERRITIN, IRONPCTSAT Lipid Panel     Component Value Date/Time   CHOL 187 02/15/2017 0834   TRIG 100 02/15/2017 0834   HDL 37 (L) 02/15/2017 0834   CHOLHDL 4 03/20/2016 1708   VLDL 39.0 03/20/2016 1708   LDLCALC 130 (H) 02/15/2017 0834   LDLDIRECT 189.0 04/22/2015 1615   Hepatic Function Panel     Component Value Date/Time   PROT 7.1 08/17/2017 1706   PROT 7.4 02/15/2017 0834   ALBUMIN 4.0 08/07/2017 0838   ALBUMIN 4.7 02/15/2017 0834     AST 16 08/17/2017 1706   ALT 25 08/17/2017 1706   ALKPHOS 58 08/07/2017 0838   BILITOT 0.5 08/17/2017 1706   BILITOT 0.7 02/15/2017 0834   BILIDIR 0.1 02/19/2014 1048      Component Value Date/Time   TSH 2.210 09/05/2016 1010   TSH 0.44 10/27/2013 1103   TSH 1.17 11/22/2010 1447  Results for Hearld, Voshon  D (MRN [id number]) as of 11/29/2017 16:10  Ref. Range 09/13/2017 11:13  Vitamin D, 25-Hydroxy Latest Ref Range: 30.0 - 100.0 ng/mL 42.6    ASSESSMENT AND PLAN: Essential hypertension - Plan: amLODipine (NORVASC) 10 MG tablet, chlorthalidone (HYGROTON) 25 MG tablet, losartan (COZAAR) 100 MG tablet  Hypokalemia - Plan: potassium chloride SA (K-DUR,KLOR-CON) 20 MEQ tablet  Vitamin D deficiency - Plan: Vitamin D, Ergocalciferol, (DRISDOL) 50000 units CAPS capsule  Class 3 severe obesity with serious comorbidity and body mass index (BMI) of 50.0 to 59.9 in adult, unspecified obesity type (HCC)  PLAN: Hypertension We discussed sodium restriction, working on healthy weight loss, and a regular exercise program as the means to achieve improved blood pressure control. [name] agreed with this plan and agreed to follow up as directed. We will continue to monitor his blood pressure as well as his progress with the above lifestyle modifications. He will continue his medications Norvasc 10 mg qd #30 with no refills, Chlorthalidone 25 mg qd #30 with no refills and Losartan 100 mg qd #30 with no refills. Sean [name] will watch for signs of hypotension as he continues his lifestyle modifications. Sean [name] has agreed to follow up with our office in 3-4 weeks.   Hypokalemia We discussed the health concerns associated with hypokalemia. Sean [name] agreed to continue KCl qd #30 with no refills. Pt has agreed to follow up with lab work as directed to monitor and to notify us if he experiences muscle cramping. Sean [name] agreed to follow up with our office in 3-4 weeks.   Vitamin D Deficiency Sean [name] was informed that low  vitamin D levels contributes to fatigue and are associated with obesity, breast, and colon cancer. He agrees to continue taking prescription Vit D @50 ,000 IU every week #4 with no refills. Sean [name] will follow up for routine testing of vitamin D, at least 2-3 times per year. He was informed of the risk of over-replacement of vitamin D and agrees to not increase his dose unless he discusses this with [name] first. Sean [name] agrees to follow up with our office in 3-4 weeks.   Obesity Sean [name] is not currently in the action stage of change. As such, his goal is to continue with weight loss efforts He has agreed to follow a lower carbohydrate, vegetable and lean protein rich diet plan Sean [name] has been instructed to work up to a goal of 150 minutes of combined cardio and strengthening exercise per week for weight loss and overall health benefits. We discussed the following Behavioral Modification Stratagies today: increasing lean protein intake and decreasing simple carbohydrates   Sean [name] has agreed to follow up with our clinic in 3-4 weeks. He was informed of the importance of frequent follow up visits to maximize his success with intensive lifestyle modifications for his multiple health conditions.   OBESITY BEHAVIORAL INTERVENTION VISIT  Today's visit was # 22   Starting weight: 416 lbs Starting date: 09/05/2016 Today's weight : Weight: (!) 388 lb (176 kg)  Today's date: 11/29/2017 Total lbs lost to date: 29 lbs    ASK: We discussed the diagnosis of obesity with Sean [name] today and Sean [name] agreed to give [name] permission to discuss obesity behavioral modification therapy today.  ASSESS: Sean [name] has the diagnosis of obesity and his BMI today is 51.2 Sean [name] is not in the action stage of change   ADVISE: Sean [name] was educated on the multiple health risks of obesity as well as the benefit of weight loss to improve his health. He was advised of  the need for long term treatment and the importance of lifestyle modifications  to improve his current health and to decrease his risk of future health problems.  AGREE: Multiple dietary modification options and treatment options were discussed and  Sean [name] agreed to follow the recommendations documented in the above note.  ARRANGE: Bharat was educated on the importance of frequent visits to treat obesity as outlined per CMS and USPSTF guidelines and agreed to schedule his next follow up appointment today.  I, Ellery Plunk, CMA, am acting as transcriptionist for Quillian Quince, MD

## 2017-12-04 ENCOUNTER — Encounter (INDEPENDENT_AMBULATORY_CARE_PROVIDER_SITE_OTHER): Payer: Self-pay | Admitting: Family Medicine

## 2017-12-17 ENCOUNTER — Ambulatory Visit (INDEPENDENT_AMBULATORY_CARE_PROVIDER_SITE_OTHER): Payer: PRIVATE HEALTH INSURANCE | Admitting: Family Medicine

## 2017-12-17 VITALS — BP 129/85 | HR 101 | Ht 73.0 in | Wt 385.0 lb

## 2017-12-17 DIAGNOSIS — F3289 Other specified depressive episodes: Secondary | ICD-10-CM | POA: Diagnosis not present

## 2017-12-17 DIAGNOSIS — R7303 Prediabetes: Secondary | ICD-10-CM | POA: Diagnosis not present

## 2017-12-17 DIAGNOSIS — E559 Vitamin D deficiency, unspecified: Secondary | ICD-10-CM | POA: Diagnosis not present

## 2017-12-17 DIAGNOSIS — I10 Essential (primary) hypertension: Secondary | ICD-10-CM | POA: Diagnosis not present

## 2017-12-17 DIAGNOSIS — Z6841 Body Mass Index (BMI) 40.0 and over, adult: Secondary | ICD-10-CM

## 2017-12-17 MED ORDER — METFORMIN HCL 500 MG PO TABS: 500.0000 mg | ORAL_TABLET | Freq: Two times a day (BID) | ORAL | 0 refills | Status: DC

## 2017-12-17 MED ORDER — VITAMIN D (ERGOCALCIFEROL) 1.25 MG (50000 UNIT) PO CAPS: 50000.0000 [IU] | ORAL_CAPSULE | ORAL | 0 refills | Status: DC

## 2017-12-17 MED ORDER — BUPROPION HCL ER (SR) 200 MG PO TB12: 200.0000 mg | ORAL_TABLET | Freq: Every day | ORAL | 0 refills | Status: DC

## 2017-12-17 MED ORDER — LOSARTAN POTASSIUM 100 MG PO TABS: 100.0000 mg | ORAL_TABLET | Freq: Every day | ORAL | 0 refills | Status: DC

## 2017-12-18 NOTE — Progress Notes (Signed)
Office: [phone]  /  Fax: [phone]   HPI:   Chief Complaint: OBESITY Sean [name] is here to discuss his progress with his obesity treatment plan. He is on the lower carbohydrate, vegetable and lean protein rich diet plan and is following his eating plan approximately 25-50 % of the time. He states he is doing cardio for 45 minutes 3 times per week. Sean [name] is on Saxenda 0.6 mg and he notes decreased hunger. He gets samples from Dr. Laury Axon but this isn't always available. He wonders if he would get more benefit with higher dose. His weight is (!) 385 lb (174.6 kg) today and has had a weight loss of 3 pounds over a period of 3 weeks since his last visit. He has lost 31 lbs since starting treatment with Sean [name].  Vitamin D Deficiency Thomson has a diagnosis of vitamin D deficiency. He is stable on prescription Vit D and denies nausea, vomiting or muscle weakness.  Pre-Diabetes Sean [name] has a diagnosis of pre-diabetes based on his elevated Hgb A1c and was informed this puts him at greater risk of developing diabetes. He is stable on metformin and Saxenda, and he continues to work on diet and exercise to decrease risk of diabetes. He denies nausea, vomiting, or hypoglycemia.  Hypertension Sean [name] is a 42 y.o. male with hypertension. Sean [name]'s blood pressure is stable on medications. He is working weight loss to help control his blood pressure with the goal of decreasing his risk of heart attack and stroke. He denies chest pain or headaches. Ante's blood pressure is currently controlled.  Depression with emotional eating behaviors Sean [name]'s mood is stable but he is still struggling with emotional eating and he is worried about increased holiday temptations. Sean [name] struggles with emotional eating and using food for comfort to the extent that it is negatively impacting his health. He often snacks when he is not hungry. Sean [name] sometimes feels he is out of control and then feels guilty that he made poor food choices. He  has been working on behavior modification techniques to help reduce his emotional eating and has been somewhat successful. He shows no sign of suicidal or homicidal ideations.  Depression screen [facility] 2/9 09/05/2016 12/02/2015  Decreased Interest 3 0  Down, Depressed, Hopeless 2 0  PHQ - 2 Score 5 0  Altered sleeping 3 -  Tired, decreased energy 3 -  Change in appetite 3 -  Feeling bad or failure about yourself  2 -  Moving slowly or fidgety/restless 0 -  Suicidal thoughts 0 -  PHQ-9 Score 16 -    ALLERGIES: No Known Allergies  MEDICATIONS: Current Outpatient Medications on File Prior to Visit  Medication Sig Dispense Refill  . amLODipine (NORVASC) 10 MG tablet Take 1 tablet (10 mg total) by mouth daily. 30 tablet 0  . atorvastatin (LIPITOR) 20 MG tablet Take 1 tablet (20 mg total) by mouth daily. 30 tablet 0  . calcium-vitamin D 250-100 MG-UNIT tablet Take 1 tablet by mouth 2 (two) times daily.    . celecoxib (CELEBREX) 200 MG capsule Take 1 capsule (200 mg total) by mouth 2 (two) times daily. 30 capsule 5  . chlorthalidone (HYGROTON) 25 MG tablet Take 1 tablet (25 mg total) by mouth daily. 30 tablet 0  . Glucosamine-Chondroitin-MSM (TRIPLE FLEX PO) Take by mouth.    Marland Kitchen ibuprofen (ADVIL,MOTRIN) 800 MG tablet Take 1 tablet (800 mg total) by mouth 3 (three) times daily. 21 tablet 0  . Liraglutide -Weight Management (SAXENDA) 18 MG/3ML  SOPN Inject 3 mg into the skin daily.    . potassium chloride SA (K-DUR,KLOR-CON) 20 MEQ tablet Take 1 tablet (20 mEq total) by mouth daily. 30 tablet 3  . Turmeric 500 MG TABS Take by mouth.     No current facility-administered medications on file prior to visit.     PAST MEDICAL HISTORY: Past Medical History:  Diagnosis Date  . Heartburn   . Hyperlipidemia   . Hypertension   . Sleep apnea   . SOB (shortness of breath) on exertion   . Swelling    feet and legs    PAST SURGICAL HISTORY: No past surgical history on file.  SOCIAL  HISTORY: Social History   Tobacco Use  . Smoking status: Never Smoker  . Smokeless tobacco: Never Used  Substance Use Topics  . Alcohol use: Yes  . Drug use: No    FAMILY HISTORY: Family History  Problem Relation Age of Onset  . Heart disease Father 62       MI  . Hypertension Father   . Sudden death Father        Heart disease  . Hyperlipidemia Father   . Obesity Father   . Diabetes Mother        Borderline  . Hyperlipidemia Mother   . Hypertension Mother   . Depression Mother   . Obesity Mother   . Breast cancer Maternal Grandmother     ROS: Review of Systems  Constitutional: Positive for weight loss.  Cardiovascular: Negative for chest pain.  Gastrointestinal: Negative for nausea and vomiting.  Musculoskeletal:       Negative muscle weakness  Neurological: Negative for headaches.  Endo/Heme/Allergies:       Negative hypoglycemia  Psychiatric/Behavioral: Positive for depression. Negative for suicidal ideas.    PHYSICAL EXAM: Blood pressure 129/85, pulse (!) 101, height 6\' 1"  (1.854 m), weight (!) 385 lb (174.6 kg), SpO2 97 %. Body mass index is 50.79 kg/m. Physical Exam  Constitutional: He is oriented to person, place, and time. He appears well-developed and well-nourished.  Cardiovascular: Normal rate.  Pulmonary/Chest: Effort normal.  Musculoskeletal: Normal range of motion.  Neurological: He is oriented to person, place, and time.  Skin: Skin is warm and dry.  Psychiatric: He has a normal mood and affect. His behavior is normal.  Vitals reviewed.   RECENT LABS AND TESTS: BMET    Component Value Date/Time   NA 139 08/17/2017 1706   NA 139 02/15/2017 0834   K 3.8 08/17/2017 1706   CL 100 08/17/2017 1706   CO2 28 08/17/2017 1706   GLUCOSE 85 08/17/2017 1706   BUN 11 08/17/2017 1706   BUN 14 02/15/2017 0834   CREATININE 0.89 08/17/2017 1706   CALCIUM 9.5 08/17/2017 1706   GFRNONAA >60 08/07/2017 0838   GFRAA >60 08/07/2017 0838   Lab Results   Component Value Date   HGBA1C 5.5 02/15/2017   HGBA1C 5.7 (H) 09/05/2016   Lab Results  Component Value Date   INSULIN 36.9 (H) 02/15/2017   INSULIN 29.3 (H) 09/05/2016   CBC    Component Value Date/Time   WBC 10.9 (H) 08/22/2017 1445   RBC 5.00 08/22/2017 1445   HGB 14.5 08/22/2017 1445   HGB 15.2 09/05/2016 1010   HCT 42.2 08/22/2017 1445   HCT 44.5 09/05/2016 1010   PLT 300.0 08/22/2017 1445   PLT 249 09/05/2016 1010   MCV 84.5 08/22/2017 1445   MCV 83 09/05/2016 1010   MCH 28.5 08/17/2017 1706  MCHC 34.4 08/22/2017 1445   RDW 13.7 08/22/2017 1445   RDW 14.6 09/05/2016 1010   LYMPHSABS 2.9 08/22/2017 1445   LYMPHSABS 2.8 09/05/2016 1010   MONOABS 0.9 08/22/2017 1445   EOSABS 0.3 08/22/2017 1445   EOSABS 0.5 (H) 09/05/2016 1010   BASOSABS 0.1 08/22/2017 1445   BASOSABS 0.1 09/05/2016 1010   Iron/TIBC/Ferritin/ %Sat No results found for: IRON, TIBC, FERRITIN, IRONPCTSAT Lipid Panel     Component Value Date/Time   CHOL 187 02/15/2017 0834   TRIG 100 02/15/2017 0834   HDL 37 (L) 02/15/2017 0834   CHOLHDL 4 03/20/2016 1708   VLDL 39.0 03/20/2016 1708   LDLCALC 130 (H) 02/15/2017 0834   LDLDIRECT 189.0 04/22/2015 1615   Hepatic Function Panel     Component Value Date/Time   PROT 7.1 08/17/2017 1706   PROT 7.4 02/15/2017 0834   ALBUMIN 4.0 08/07/2017 0838   ALBUMIN 4.7 02/15/2017 0834   AST 16 08/17/2017 1706   ALT 25 08/17/2017 1706   ALKPHOS 58 08/07/2017 0838   BILITOT 0.5 08/17/2017 1706   BILITOT 0.7 02/15/2017 0834   BILIDIR 0.1 02/19/2014 1048      Component Value Date/Time   TSH 2.210 09/05/2016 1010   TSH 0.44 10/27/2013 1103   TSH 1.17 11/22/2010 1447   Results for Aurea GraffBEAMAN, Juanmanuel D (MRN [id number]) as of 12/18/2017 12:53  Ref. Range 09/13/2017 11:13  Vitamin D, 25-Hydroxy Latest Ref Range: 30.0 - 100.0 ng/mL 42.6   ASSESSMENT AND PLAN: Vitamin D deficiency - Plan: Vitamin D, Ergocalciferol, (DRISDOL) 1.25 MG (50000 UT) CAPS  capsule  Prediabetes - Plan: metFORMIN (GLUCOPHAGE) 500 MG tablet  Essential hypertension - Plan: losartan (COZAAR) 100 MG tablet  Other depression - with emotional eating - Plan: buPROPion (WELLBUTRIN SR) 200 MG 12 hr tablet  Class 3 severe obesity with serious comorbidity and body mass index (BMI) of 50.0 to 59.9 in adult, unspecified obesity type (HCC)  PLAN:  Vitamin D Deficiency Italo was informed that low vitamin D levels contributes to fatigue and are associated with obesity, breast, and colon cancer. Excell agrees to continue taking prescription Vit D @50 ,000 IU every week #4 and we will refill for 1 month. He will follow up for routine testing of vitamin D, at least 2-3 times per year. He was informed of the risk of over-replacement of vitamin D and agrees to not increase his dose unless he discusses this with us first. Clayburn Pertvan agrees to follow up with our clinic in 3 to 4 weeks.  Pre-Diabetes Clayburn Pertvan will continue to work on weight loss, exercise, and decreasing simple carbohydrates in his diet to help decrease the risk of diabetes. We dicussed metformin including benefits and risks. He was informed that eating too many simple carbohydrates or too many calories at one sitting increases the likelihood of GI side effects. Clayburn Pertvan agrees to continue taking metformin 500 mg BID #60 and we will refill for 1 month. Clayburn Pertvan agrees to follow up with our clinic in 3 to 4 weeks as directed to monitor his progress.  Hypertension We discussed sodium restriction, working on healthy weight loss, and a regular exercise program as the means to achieve improved blood pressure control. Clayburn PertEvan agreed with this plan and agreed to follow up as directed. We will continue to monitor his blood pressure as well as his progress with the above lifestyle modifications. Clayburn Pertvan agrees to continue taking losartan 100 mg qd #30 and we will refill for 1 month. He agrees to continue taking  chlorthalidone 25 mg qd #30 and we will refill for  1 month, and he agrees to continue taking amlodipine 10 mg qd #30 and we will refill for 1 month. He will watch for signs of hypotension as he continues his lifestyle modifications. Keita agrees to follow up with our clinic in 3 to 4 weeks.  Depression with Emotional Eating Behaviors We discussed behavior modification techniques today to help Viyan deal with his emotional eating and depression. Lonnie agrees to continue taking Wellbutrin SR 200 mg qd #30 and we will refill for 1 month. Baldo agrees to follow up with our clinic in 3 to 4 weeks.  Obesity Donaven is currently in the action stage of change. As such, his goal is to continue with weight loss efforts He has agreed to change to follow the Category 2 plan Osvaldo has been instructed to work up to a goal of 150 minutes of combined cardio and strengthening exercise per week for weight loss and overall health benefits. We discussed the following Behavioral Modification Strategies today: increasing lean protein intake, decreasing simple carbohydrates, holiday eating strategies, and celebration eating strategies We discussed various medication options to help Wardell with his weight loss efforts and we both agreed to increase Saxenda to 1.2 mg (no refill needed).   Tilak has agreed to follow up with our clinic in 3 to 4 weeks. He was informed of the importance of frequent follow up visits to maximize his success with intensive lifestyle modifications for his multiple health conditions.   OBESITY BEHAVIORAL INTERVENTION VISIT  Today's visit was # 23   Starting weight: 416 lbs Starting date: 09/05/16 Today's weight : 385 lbs  Today's date: 12/17/2017 Total lbs lost to date: 31 At least 15 minutes were spent on discussing the following behavioral intervention visit.   ASK: We discussed the diagnosis of obesity with Aurea Graff today and Tanay agreed to give Sean [name] permission to discuss obesity behavioral modification therapy today.  ASSESS: Irving has  the diagnosis of obesity and his BMI today is 50.81 Joden is in the action stage of change   ADVISE: Dollie was educated on the multiple health risks of obesity as well as the benefit of weight loss to improve his health. He was advised of the need for long term treatment and the importance of lifestyle modifications to improve his current health and to decrease his risk of future health problems.  AGREE: Multiple dietary modification options and treatment options were discussed and  Sinjin agreed to follow the recommendations documented in the above note.  ARRANGE: Hiroki was educated on the importance of frequent visits to treat obesity as outlined per CMS and USPSTF guidelines and agreed to schedule his next follow up appointment today.  I, Burt Knack, am acting as transcriptionist for Quillian Quince, MD  I have reviewed the above documentation for accuracy and completeness, and I agree with the above. -Quillian Quince, MD

## 2017-12-19 ENCOUNTER — Encounter (INDEPENDENT_AMBULATORY_CARE_PROVIDER_SITE_OTHER): Payer: Self-pay | Admitting: Family Medicine

## 2017-12-19 MED ORDER — CHLORTHALIDONE 25 MG PO TABS: 25.0000 mg | ORAL_TABLET | Freq: Every day | ORAL | 0 refills | Status: DC

## 2017-12-19 MED ORDER — AMLODIPINE BESYLATE 10 MG PO TABS: 10.0000 mg | ORAL_TABLET | Freq: Every day | ORAL | 0 refills | Status: DC

## 2017-12-24 ENCOUNTER — Encounter: Payer: Self-pay | Admitting: Family Medicine

## 2018-01-15 ENCOUNTER — Ambulatory Visit (INDEPENDENT_AMBULATORY_CARE_PROVIDER_SITE_OTHER): Payer: PRIVATE HEALTH INSURANCE | Admitting: Family Medicine

## 2018-01-17 ENCOUNTER — Ambulatory Visit (INDEPENDENT_AMBULATORY_CARE_PROVIDER_SITE_OTHER): Payer: PRIVATE HEALTH INSURANCE | Admitting: Family Medicine

## 2018-01-17 ENCOUNTER — Encounter (INDEPENDENT_AMBULATORY_CARE_PROVIDER_SITE_OTHER): Payer: Self-pay | Admitting: Family Medicine

## 2018-01-17 VITALS — BP 128/79 | HR 84 | Temp 98.1°F | Ht 73.0 in | Wt 395.0 lb

## 2018-01-17 DIAGNOSIS — E876 Hypokalemia: Secondary | ICD-10-CM | POA: Diagnosis not present

## 2018-01-17 DIAGNOSIS — E7849 Other hyperlipidemia: Secondary | ICD-10-CM | POA: Diagnosis not present

## 2018-01-17 DIAGNOSIS — E559 Vitamin D deficiency, unspecified: Secondary | ICD-10-CM

## 2018-01-17 DIAGNOSIS — F3289 Other specified depressive episodes: Secondary | ICD-10-CM

## 2018-01-17 DIAGNOSIS — Z9189 Other specified personal risk factors, not elsewhere classified: Secondary | ICD-10-CM

## 2018-01-17 DIAGNOSIS — I10 Essential (primary) hypertension: Secondary | ICD-10-CM | POA: Diagnosis not present

## 2018-01-17 DIAGNOSIS — R7303 Prediabetes: Secondary | ICD-10-CM

## 2018-01-17 DIAGNOSIS — Z6841 Body Mass Index (BMI) 40.0 and over, adult: Secondary | ICD-10-CM

## 2018-01-17 MED ORDER — ATORVASTATIN CALCIUM 20 MG PO TABS: 20.0000 mg | ORAL_TABLET | Freq: Every day | ORAL | 0 refills | Status: DC

## 2018-01-17 MED ORDER — POTASSIUM CHLORIDE CRYS ER 20 MEQ PO TBCR: 20.0000 meq | EXTENDED_RELEASE_TABLET | Freq: Every day | ORAL | 0 refills | Status: DC

## 2018-01-17 MED ORDER — METFORMIN HCL 500 MG PO TABS: 500.0000 mg | ORAL_TABLET | Freq: Two times a day (BID) | ORAL | 0 refills | Status: DC

## 2018-01-17 MED ORDER — BUPROPION HCL ER (SR) 200 MG PO TB12: 200.0000 mg | ORAL_TABLET | Freq: Every day | ORAL | 0 refills | Status: DC

## 2018-01-17 MED ORDER — VITAMIN D (ERGOCALCIFEROL) 1.25 MG (50000 UNIT) PO CAPS: 50000.0000 [IU] | ORAL_CAPSULE | ORAL | 0 refills | Status: DC

## 2018-01-17 MED ORDER — CHLORTHALIDONE 25 MG PO TABS: 25.0000 mg | ORAL_TABLET | Freq: Every day | ORAL | 0 refills | Status: DC

## 2018-01-17 MED ORDER — AMLODIPINE BESYLATE 10 MG PO TABS: 10.0000 mg | ORAL_TABLET | Freq: Every day | ORAL | 0 refills | Status: DC

## 2018-01-17 MED ORDER — LOSARTAN POTASSIUM 100 MG PO TABS: 100.0000 mg | ORAL_TABLET | Freq: Every day | ORAL | 0 refills | Status: DC

## 2018-01-21 NOTE — Progress Notes (Signed)
Office: [phone]  /  Fax: [phone]   HPI:   Chief Complaint: OBESITY Sean [name] is here to discuss his progress with his obesity treatment plan. He is on the Category 3 and lower carbohydrate, vegetable and lean protein rich diet plan and is following his eating plan approximately 30 to 40 % of the time. He states he is doing cardio and light weights 45 to 60 minutes 2 to 3 times per week. Sean [name] has not been meal planning or prepping and has gotten off track. He has been trying to portion control, but not doing well. He is frustrated at how difficult it is to lose weight.  His weight is (!) 395 lb (179.2 kg) today and has had a weight gain of 10 pounds over a period of 4 weeks since his last visit. He has lost 21 lbs since starting treatment with [name].  Hypertension Sean [name] is a 42 y.o. male with hypertension. Sean [name]'s blood pressure is stable on medications. He is working on weight loss to help control his blood pressure with the goal of decreasing his risk of heart attack and stroke. Sean [name] denies chest pain, shortness of breath, or lightheadedness.  Hyperlipidemia Sean [name] has hyperlipidemia and has been trying to improve his cholesterol levels with intensive lifestyle modification including a low saturated fat diet, exercise and weight loss. He is stable on Lipitor and denies any chest pain or myalgias.  Vitamin D deficiency Sean [name] has a diagnosis of vitamin D deficiency. He is currently taking vit D and is stable. He denies nausea, vomiting, or muscle weakness.  Hypokalemia Sean [name] is stable on potassium chloride. He is getting labs done at his PCP next month.  Pre-Diabetes Sean [name] has a diagnosis of pre-diabetes based on his elevated Hgb A1c and was informed this puts him at greater risk of developing diabetes. He is stable on metformin and is not doing well with his diet prescription. He continues to work on diet and exercise to decrease risk of diabetes. He denies nausea, vomiting, or  hypoglycemia.  At risk for diabetes Sean [name] is at higher than average risk for developing diabetes due to his pre-diabetes and obesity. He currently denies polyuria or polydipsia.  Depression with emotional eating behaviors Sean [name] is struggling with emotional eating and using food for comfort to the extent that it is negatively impacting his health. He often snacks when he is not hungry. Sean [name] sometimes feels he is out of control and then feels guilty that he made poor food choices. He has been working on behavior modification techniques to help reduce his emotional eating and has been somewhat successful. His mood is stable on Wellbutrin. His blood pressure is stable. He denies insomnia.  ALLERGIES: No Known Allergies  MEDICATIONS: Current Outpatient Medications on File Prior to Visit  Medication Sig Dispense Refill  . calcium-vitamin D 250-100 MG-UNIT tablet Take 1 tablet by mouth 2 (two) times daily.    . celecoxib (CELEBREX) 200 MG capsule Take 1 capsule (200 mg total) by mouth 2 (two) times daily. 30 capsule 5  . Glucosamine-Chondroitin-MSM (TRIPLE FLEX PO) Take by mouth.    Marland Kitchen ibuprofen (ADVIL,MOTRIN) 800 MG tablet Take 1 tablet (800 mg total) by mouth 3 (three) times daily. 21 tablet 0  . Liraglutide -Weight Management (SAXENDA) 18 MG/3ML SOPN Inject 3 mg into the skin daily.    . Turmeric 500 MG TABS Take by mouth.     No current facility-administered medications on file prior to visit.     PAST  MEDICAL HISTORY: Past Medical History:  Diagnosis Date  . Heartburn   . Hyperlipidemia   . Hypertension   . Sleep apnea   . SOB (shortness of breath) on exertion   . Swelling    feet and legs    PAST SURGICAL HISTORY: History reviewed. No pertinent surgical history.  SOCIAL HISTORY: Social History   Tobacco Use  . Smoking status: Never Smoker  . Smokeless tobacco: Never Used  Substance Use Topics  . Alcohol use: Yes  . Drug use: No    FAMILY HISTORY: Family History    Problem Relation Age of Onset  . Heart disease Father [id number]       MI  . Hypertension Father   . Sudden death Father        Heart disease  . Hyperlipidemia Father   . Obesity Father   . Diabetes Mother        Borderline  . Hyperlipidemia Mother   . Hypertension Mother   . Depression Mother   . Obesity Mother   . Breast cancer Maternal Grandmother     ROS: Review of Systems  Constitutional: Negative for weight loss.  Respiratory: Negative for shortness of breath.   Cardiovascular: Negative for chest pain.  Gastrointestinal: Negative for nausea and vomiting.  Genitourinary:       Negative for polyuria.  Musculoskeletal: Negative for myalgias.       Negative for muscle weakness.  Neurological:       Negative for lightheadedness.  Endo/Heme/Allergies: Negative for polydipsia.       Negative for hypoglycemia.  Psychiatric/Behavioral: Positive for depression. The patient does not have insomnia.     PHYSICAL EXAM: Blood pressure 128/79, pulse 84, temperature 98.1 F (36.7 C), temperature source Oral, height 6\' 1"  (1.854 m), weight (!) 395 lb (179.2 kg), SpO2 97 %. Body mass index is 52.11 kg/m. Physical Exam Vitals signs reviewed.  Constitutional:      Appearance: Normal appearance. He is obese.  Cardiovascular:     Rate and Rhythm: Normal rate.  Pulmonary:     Effort: Pulmonary effort is normal.  Musculoskeletal: Normal range of motion.  Skin:    General: Skin is warm and dry.  Neurological:     Mental Status: He is alert and oriented to person, place, and time.  Psychiatric:        Mood and Affect: Mood normal.        Behavior: Behavior normal.     RECENT LABS AND TESTS: BMET    Component Value Date/Time   NA 139 08/17/2017 1706   NA 139 02/15/2017 0834   K 3.8 08/17/2017 1706   CL 100 08/17/2017 1706   CO2 28 08/17/2017 1706   GLUCOSE 85 08/17/2017 1706   BUN 11 08/17/2017 1706   BUN 14 02/15/2017 0834   CREATININE 0.89 08/17/2017 1706   CALCIUM 9.5  08/17/2017 1706   GFRNONAA >60 08/07/2017 0838   GFRAA >60 08/07/2017 0838   Lab Results  Component Value Date   HGBA1C 5.5 02/15/2017   HGBA1C 5.7 (H) 09/05/2016   Lab Results  Component Value Date   INSULIN 36.9 (H) 02/15/2017   INSULIN 29.3 (H) 09/05/2016   CBC    Component Value Date/Time   WBC 10.9 (H) 08/22/2017 1445   RBC 5.00 08/22/2017 1445   HGB 14.5 08/22/2017 1445   HGB 15.2 09/05/2016 1010   HCT 42.2 08/22/2017 1445   HCT 44.5 09/05/2016 1010   PLT 300.0 08/22/2017 1445  PLT 249 09/05/2016 1010   MCV 84.5 08/22/2017 1445   MCV 83 09/05/2016 1010   MCH 28.5 08/17/2017 1706   MCHC 34.4 08/22/2017 1445   RDW 13.7 08/22/2017 1445   RDW 14.6 09/05/2016 1010   LYMPHSABS 2.9 08/22/2017 1445   LYMPHSABS 2.8 09/05/2016 1010   MONOABS 0.9 08/22/2017 1445   EOSABS 0.3 08/22/2017 1445   EOSABS 0.5 (H) 09/05/2016 1010   BASOSABS 0.1 08/22/2017 1445   BASOSABS 0.1 09/05/2016 1010   Iron/TIBC/Ferritin/ %Sat No results found for: IRON, TIBC, FERRITIN, IRONPCTSAT Lipid Panel     Component Value Date/Time   CHOL 187 02/15/2017 0834   TRIG 100 02/15/2017 0834   HDL 37 (L) 02/15/2017 0834   CHOLHDL 4 03/20/2016 1708   VLDL 39.0 03/20/2016 1708   LDLCALC 130 (H) 02/15/2017 0834   LDLDIRECT 189.0 04/22/2015 1615   Hepatic Function Panel     Component Value Date/Time   PROT 7.1 08/17/2017 1706   PROT 7.4 02/15/2017 0834   ALBUMIN 4.0 08/07/2017 0838   ALBUMIN 4.7 02/15/2017 0834   AST 16 08/17/2017 1706   ALT 25 08/17/2017 1706   ALKPHOS 58 08/07/2017 0838   BILITOT 0.5 08/17/2017 1706   BILITOT 0.7 02/15/2017 0834   BILIDIR 0.1 02/19/2014 1048      Component Value Date/Time   TSH 2.210 09/05/2016 1010   TSH 0.44 10/27/2013 1103   TSH 1.17 11/22/2010 1447   Results for CYRIL, WOODMANSEE (MRN [id number]) as of 01/21/2018 11:04  Ref. Range 09/13/2017 11:13  Vitamin D, 25-Hydroxy Latest Ref Range: 30.0 - 100.0 ng/mL 42.6   ASSESSMENT AND PLAN: Essential  hypertension - Plan: chlorthalidone (HYGROTON) 25 MG tablet, amLODipine (NORVASC) 10 MG tablet, losartan (COZAAR) 100 MG tablet  Other hyperlipidemia - Plan: atorvastatin (LIPITOR) 20 MG tablet  Vitamin D deficiency - Plan: Vitamin D, Ergocalciferol, (DRISDOL) 1.25 MG (50000 UT) CAPS capsule  Hypokalemia - Plan: potassium chloride SA (K-DUR,KLOR-CON) 20 MEQ tablet  Prediabetes - Plan: metFORMIN (GLUCOPHAGE) 500 MG tablet  Other depression - with emotional eating - Plan: buPROPion (WELLBUTRIN SR) 200 MG 12 hr tablet  At risk for diabetes mellitus  Class 3 severe obesity with serious comorbidity and body mass index (BMI) of 50.0 to 59.9 in adult, unspecified obesity type (HCC)  PLAN:  Hypertension We discussed sodium restriction, working on healthy weight loss, and a regular exercise program as the means to achieve improved blood pressure control. We will continue to monitor his blood pressure as well as his progress with the above lifestyle modifications. He will continue amlodipine 10mg  qd #30 with no refills, losartan 100mg  qd #30 with no refills, and chlorthalidone 25mg  qd # 30 with no refills. He will watch for signs of hypotension as he continues his lifestyle modifications. Kayl agreed with this plan and agreed to follow up as directed in 4 weeks.  Hyperlipidemia Kahlil was informed of the American Heart Association Guidelines emphasizing intensive lifestyle modifications as the first line treatment for hyperlipidemia. We discussed many lifestyle modifications today in depth, and Wallis will continue to work on decreasing saturated fats such as fatty red meat, butter and many fried foods. He will also increase vegetables and lean protein in his diet and continue to work on exercise and weight loss efforts. Everardo agrees to continue taking Lipitor 20mg  qd #30 with no refills and will follow up as directed.  Vitamin D Deficiency Sean [name] was informed that low vitamin D levels contributes to fatigue  and are associated  with obesity, breast, and colon cancer. He agrees to continue to take prescription Vit D @50 ,000 IU every week #4 with no refills and will follow up for routine testing of vitamin D, at least 2-3 times per year. He was informed of the risk of over-replacement of vitamin D and agrees to not increase his dose unless he discusses this with [name] first. Eutimio agrees with this plan.  Hypokalemia Sean [name] agrees to continue taking potassium chloride qd #30 with no refills and will follow up as directed.  Pre-Diabetes Sean [name] will continue to work on weight loss, exercise, and decreasing simple carbohydrates in his diet to help decrease the risk of diabetes. He was informed that eating too many simple carbohydrates or too many calories at one sitting increases the likelihood of GI side effects. Sean [name] agreed to continue taking metformin 500mg  BID with no refills and a prescription was written today. Sean [name] agreed to follow up with [name] as directed to monitor his progress.  Diabetes risk counseling Sean [name] was given extended (15 minutes) diabetes prevention counseling today. He is 42 y.o. male and has risk factors for diabetes including pre-diabetes and obesity. We discussed intensive lifestyle modifications today with an emphasis on weight loss as well as increasing exercise and decreasing simple carbohydrates in his diet.  Depression with Emotional Eating Behaviors We discussed behavior modification techniques today to help Sean [name] deal with his emotional eating and depression. He has agreed to take Wellbutrin SR 200mg  qd and agreed to follow up as directed in 4 weeks.  Obesity Sean [name] is currently in the action stage of change. As such, his goal is to maintain weight over Christmas. He has agreed to portion control better and make smarter food choices.  Kedron has been instructed to work up to a goal of 150 minutes of combined cardio and strengthening exercise per week for weight loss and overall health  benefits. We discussed the following Behavioral Modification Strategies today: increasing lean protein intake, decreasing simple carbohydrates, and work on meal planning and easy cooking plans. We discussed various medication options to help Sean [name] with his weight loss efforts and we both agreed to continue Saxenda per Dr. Laury Axon and work on maintaining weight over Christmas.  Sean [name] has agreed to follow up with our clinic in 4 weeks. He was informed of the importance of frequent follow up visits to maximize his success with intensive lifestyle modifications for his multiple health conditions.   OBESITY BEHAVIORAL INTERVENTION VISIT  Today's visit was # 24   Starting weight: 416 lbs Starting date: 09/05/16 Today's weight : Weight: (!) 395 lb (179.2 kg)  Today's date: 01/17/2018 Total lbs lost to date: 21  ASK: We discussed the diagnosis of obesity with Sean [name] today and Sean [name] agreed to give [name] permission to discuss obesity behavioral modification therapy today.  ASSESS: Sean [name] has the diagnosis of obesity and his BMI today is 52.13. Sean [name] is in the action stage of change.   ADVISE: Sean [name] was educated on the multiple health risks of obesity as well as the benefit of weight loss to improve his health. He was advised of the need for long term treatment and the importance of lifestyle modifications to improve his current health and to decrease his risk of future health problems.  AGREE: Multiple dietary modification options and treatment options were discussed and Jagdeep agreed to follow the recommendations documented in the above note.  ARRANGE: Estel was educated on the importance of frequent visits to treat obesity as outlined per  CMS and USPSTF guidelines and agreed to schedule his next follow up appointment today.  I, Kirke Corin, am acting as transcriptionist for Wilder Glade, MD  I have reviewed the above documentation for accuracy and completeness, and I agree with the above.  -Quillian Quince, MD

## 2018-02-14 ENCOUNTER — Ambulatory Visit (INDEPENDENT_AMBULATORY_CARE_PROVIDER_SITE_OTHER): Payer: Self-pay | Admitting: Family Medicine

## 2018-02-14 ENCOUNTER — Encounter (INDEPENDENT_AMBULATORY_CARE_PROVIDER_SITE_OTHER): Payer: Self-pay | Admitting: Family Medicine

## 2018-02-14 VITALS — BP 127/82 | HR 90 | Temp 97.6°F | Ht 73.0 in | Wt 393.0 lb

## 2018-02-14 DIAGNOSIS — I10 Essential (primary) hypertension: Secondary | ICD-10-CM

## 2018-02-14 DIAGNOSIS — F3289 Other specified depressive episodes: Secondary | ICD-10-CM

## 2018-02-14 DIAGNOSIS — E559 Vitamin D deficiency, unspecified: Secondary | ICD-10-CM

## 2018-02-14 DIAGNOSIS — Z6841 Body Mass Index (BMI) 40.0 and over, adult: Secondary | ICD-10-CM

## 2018-02-14 DIAGNOSIS — R7303 Prediabetes: Secondary | ICD-10-CM

## 2018-02-14 DIAGNOSIS — E876 Hypokalemia: Secondary | ICD-10-CM

## 2018-02-14 DIAGNOSIS — E7849 Other hyperlipidemia: Secondary | ICD-10-CM

## 2018-02-14 MED ORDER — CHLORTHALIDONE 25 MG PO TABS: 25.0000 mg | ORAL_TABLET | Freq: Every day | ORAL | 0 refills | Status: DC

## 2018-02-14 MED ORDER — VITAMIN D (ERGOCALCIFEROL) 1.25 MG (50000 UNIT) PO CAPS: 50000.0000 [IU] | ORAL_CAPSULE | ORAL | 0 refills | Status: DC

## 2018-02-14 MED ORDER — BUPROPION HCL ER (SR) 200 MG PO TB12: 200.0000 mg | ORAL_TABLET | Freq: Every day | ORAL | 0 refills | Status: DC

## 2018-02-14 MED ORDER — POTASSIUM CHLORIDE CRYS ER 20 MEQ PO TBCR: 20.0000 meq | EXTENDED_RELEASE_TABLET | Freq: Every day | ORAL | 0 refills | Status: DC

## 2018-02-14 MED ORDER — LOSARTAN POTASSIUM 100 MG PO TABS: 100.0000 mg | ORAL_TABLET | Freq: Every day | ORAL | 0 refills | Status: DC

## 2018-02-14 MED ORDER — METFORMIN HCL 500 MG PO TABS: 500.0000 mg | ORAL_TABLET | Freq: Two times a day (BID) | ORAL | 0 refills | Status: DC

## 2018-02-14 MED ORDER — ATORVASTATIN CALCIUM 20 MG PO TABS: 20.0000 mg | ORAL_TABLET | Freq: Every day | ORAL | 0 refills | Status: DC

## 2018-02-14 MED ORDER — AMLODIPINE BESYLATE 10 MG PO TABS: 10.0000 mg | ORAL_TABLET | Freq: Every day | ORAL | 0 refills | Status: DC

## 2018-02-18 NOTE — Progress Notes (Signed)
Office: [phone]  /  Fax: [phone]   HPI:   Chief Complaint: OBESITY Sean [name] is here to discuss his progress with his obesity treatment plan. He is on the portion control better and make smarter food choices, such as increase vegetables and decrease simple carbohydrates  and is following his eating plan approximately 45-50 % of the time. He states he is doing cardio and light weights 45 to 60 minutes 2 to 3 times per week. Sean [name] has gotten back to exercising since the 1st of the year. He is ready to start low carb portion control again. His weight is (!) 393 lb (178.3 kg) today and has had a weight loss of 2 pounds over a period of 5 weeks since his last visit. He has lost 23 lbs since starting treatment with [name].  Hypertension Sean [name] is a 43 y.o. male with hypertension.  Sean [name] denies chest pain or being lightheaded. He is working weight loss to help control his blood pressure with the goal of decreasing his risk of heart attack and stroke. Sean [name] blood pressure is currently controlled and stable on medications. He is working on diet and exercise.   Hyperlipidemia Sean [name] has hyperlipidemia and has been trying to improve his cholesterol levels with intensive lifestyle modification including a low saturated fat diet, exercise and weight loss. He denies any chest pain, claudication or myalgias. He is stable on Lipitor.  Depression with emotional eating behaviors Sean [name] is struggling with emotional eating and using food for comfort to the extent that it is negatively impacting his health. He often snacks when he is not hungry. Sean [name] sometimes feels he is out of control and then feels guilty that he made poor food choices. He has been working on behavior modification techniques to help reduce his emotional eating and has been somewhat successful. Sean [name] mood and blood pressure are stable on Wellbutrin. He denies insomnia. He shows no sign of suicidal ideations.  Depression screen [facility]  2/9 09/05/2016 12/02/2015  Decreased Interest 3 0  Down, Depressed, Hopeless 2 0  PHQ - 2 Score 5 0  Altered sleeping 3 -  Tired, decreased energy 3 -  Change in appetite 3 -  Feeling bad or failure about yourself  2 -  Moving slowly or fidgety/restless 0 -  Suicidal thoughts 0 -  PHQ-9 Score 16 -    Vitamin D deficiency Sean [name] has a diagnosis of vitamin D deficiency. He is currently taking prescription Vit D and is stable but not yet at goal. He denies nausea, vomiting or muscle weakness.  Hypokalemia Sean [name] is stable on potassium chloride. He denies cramping or nausea.  Pre-Diabetes Sean [name] has a diagnosis of prediabetes based on his elevated Hgb A1c and was informed this puts him at greater risk of developing diabetes. He is stable on metformin currently and continues to work on diet and exercise to decrease risk of diabetes. He denies nausea, vomiting or hypoglycemia. His last A1C was improved at 5.5.    ASSESSMENT AND PLAN:  Essential hypertension - Plan: amLODipine (NORVASC) 10 MG tablet, chlorthalidone (HYGROTON) 25 MG tablet, losartan (COZAAR) 100 MG tablet  Other hyperlipidemia - Plan: atorvastatin (LIPITOR) 20 MG tablet  Vitamin D deficiency - Plan: Vitamin D, Ergocalciferol, (DRISDOL) 1.25 MG (50000 UT) CAPS capsule  Hypokalemia - Plan: potassium chloride SA (K-DUR,KLOR-CON) 20 MEQ tablet  Prediabetes - Plan: metFORMIN (GLUCOPHAGE) 500 MG tablet  Other depression - with emotional eating - Plan: buPROPion (WELLBUTRIN SR) 200 MG 12 hr  tablet  Class 3 severe obesity with serious comorbidity and body mass index (BMI) of 50.0 to 59.9 in adult, unspecified obesity type (HCC)  PLAN:  Hypertension We discussed sodium restriction, working on healthy weight loss, and a regular exercise program as the means to achieve improved blood pressure control. Sean [name] agreed with this plan and agreed to follow up as directed. We will continue to monitor his blood pressure as well as his progress  with the above lifestyle modifications. He will continue amlodipine 10 mg qd #30 with no refills, losartan 100 mg qd #30 with no refills, and chlorthalidone 25 mg qd # 30 with no refills. He will watch for signs of hypotension as he continues his lifestyle modifications. Reichen agrees to follow up with our clinic in 3-4 weeks.  Hyperlipidemia Kena was informed of the American Heart Association Guidelines emphasizing intensive lifestyle modifications as the first line treatment for hyperlipidemia. We discussed many lifestyle modifications today in depth, and Lyndsey will continue to work on decreasing saturated fats such as fatty red meat, butter and many fried foods. He will also increase vegetables and lean protein in his diet and continue to work on exercise and weight loss efforts. Sean [name] agrees to continue taking Lipitor 20 mg qd #30 with no refills and will follow up with our clinic in 3-4 weeks.   Depression with Emotional Eating Behaviors We discussed behavior modification techniques today to help Ishak deal with his emotional eating and depression. Sean [name] has agreed to take Wellbutrin SR 200 mg qd with no refills and agrees follow up with our clinic in 3-4 weeks.   Hypokalemia Sean [name] is stable on potassium chloride SA 20 meq daily #30 with no refills. We will check fasting labs at next visit. Earnest agrees to follow up with our clinic in 3-4 weeks.  Pre-Diabetes Sean [name] will continue to work on weight loss, exercise, and decreasing simple carbohydrates in his diet to help decrease the risk of diabetes. We dicussed metformin including benefits and risks. He was informed that eating too many simple carbohydrates or too many calories at one sitting increases the likelihood of GI side effects. Sean [name] continue taking metformin 500 mg bid #60 with no refills for now and a prescription was written today. Sean [name] agrees to follow up with our clinic in 3-4 weeks.  Obesity Sean [name] is currently in the action stage of change. As  such, his goal is to continue with weight loss efforts He has agreed to follow a lower carbohydrate, vegetable and lean protein rich diet plan Sean [name] has been instructed to work up to a goal of 150 minutes of combined cardio and strengthening exercise per week for weight loss and overall health benefits. We discussed the following Behavioral Modification Strategies today: increasing lean protein intake, decreasing simple carbohydrates  and work on meal planning and easy cooking plans  Sean [name] has agreed to follow up with our clinic in 3-4 weeks. He was informed of the importance of frequent follow up visits to maximize his success with intensive lifestyle modifications for his multiple health conditions.  ALLERGIES: No Known Allergies  MEDICATIONS: Current Outpatient Medications on File Prior to Visit  Medication Sig Dispense Refill  . calcium-vitamin D 250-100 MG-UNIT tablet Take 1 tablet by mouth 2 (two) times daily.    . celecoxib (CELEBREX) 200 MG capsule Take 1 capsule (200 mg total) by mouth 2 (two) times daily. 30 capsule 5  . Glucosamine-Chondroitin-MSM (TRIPLE FLEX PO) Take by mouth.    Marland Kitchen ibuprofen (ADVIL,MOTRIN) 800  MG tablet Take 1 tablet (800 mg total) by mouth 3 (three) times daily. 21 tablet 0  . Liraglutide -Weight Management (SAXENDA) 18 MG/3ML SOPN Inject 3 mg into the skin daily.    . Turmeric 500 MG TABS Take by mouth.     No current facility-administered medications on file prior to visit.     PAST MEDICAL HISTORY: Past Medical History:  Diagnosis Date  . Heartburn   . Hyperlipidemia   . Hypertension   . Sleep apnea   . SOB (shortness of breath) on exertion   . Swelling    feet and legs    PAST SURGICAL HISTORY: History reviewed. No pertinent surgical history.  SOCIAL HISTORY: Social History   Tobacco Use  . Smoking status: Never Smoker  . Smokeless tobacco: Never Used  Substance Use Topics  . Alcohol use: Yes  . Drug use: No    FAMILY HISTORY: Family  History  Problem Relation Age of Onset  . Heart disease Father 70       MI  . Hypertension Father   . Sudden death Father        Heart disease  . Hyperlipidemia Father   . Obesity Father   . Diabetes Mother        Borderline  . Hyperlipidemia Mother   . Hypertension Mother   . Depression Mother   . Obesity Mother   . Breast cancer Maternal Grandmother     ROS: Review of Systems  Constitutional: Positive for weight loss.  Cardiovascular: Negative for chest pain.  Gastrointestinal: Negative for nausea and vomiting.  Musculoskeletal: Negative for myalgias.       Negative for muscle cramping Negative for muscle weakness  Neurological: Negative for dizziness.  Endo/Heme/Allergies:       Negative for hypoglycemia  Psychiatric/Behavioral: Positive for depression. Negative for suicidal ideas. The patient does not have insomnia.     PHYSICAL EXAM: Blood pressure 127/82, pulse 90, temperature 97.6 F (36.4 C), temperature source Oral, height 6\' 1"  (1.854 m), weight (!) 393 lb (178.3 kg), SpO2 98 %. Body mass index is 51.85 kg/m. Physical Exam Vitals signs reviewed.  Constitutional:      Appearance: Normal appearance. He is obese.  Cardiovascular:     Rate and Rhythm: Normal rate.  Pulmonary:     Effort: Pulmonary effort is normal.  Musculoskeletal: Normal range of motion.     Right lower leg: No edema.     Left lower leg: No edema.     Comments: Positive for 1+ edema bilateral lower extremities  Skin:    General: Skin is warm and dry.  Neurological:     Mental Status: He is alert and oriented to person, place, and time.  Psychiatric:        Mood and Affect: Mood normal.     RECENT LABS AND TESTS: BMET    Component Value Date/Time   NA 139 08/17/2017 1706   NA 139 02/15/2017 0834   K 3.8 08/17/2017 1706   CL 100 08/17/2017 1706   CO2 28 08/17/2017 1706   GLUCOSE 85 08/17/2017 1706   BUN 11 08/17/2017 1706   BUN 14 02/15/2017 0834   CREATININE 0.89 08/17/2017  1706   CALCIUM 9.5 08/17/2017 1706   GFRNONAA >60 08/07/2017 0838   GFRAA >60 08/07/2017 0838   Lab Results  Component Value Date   HGBA1C 5.5 02/15/2017   HGBA1C 5.7 (H) 09/05/2016   Lab Results  Component Value Date   INSULIN 36.9 (  H) 02/15/2017   INSULIN 29.3 (H) 09/05/2016   CBC    Component Value Date/Time   WBC 10.9 (H) 08/22/2017 1445   RBC 5.00 08/22/2017 1445   HGB 14.5 08/22/2017 1445   HGB 15.2 09/05/2016 1010   HCT 42.2 08/22/2017 1445   HCT 44.5 09/05/2016 1010   PLT 300.0 08/22/2017 1445   PLT 249 09/05/2016 1010   MCV 84.5 08/22/2017 1445   MCV 83 09/05/2016 1010   MCH 28.5 08/17/2017 1706   MCHC 34.4 08/22/2017 1445   RDW 13.7 08/22/2017 1445   RDW 14.6 09/05/2016 1010   LYMPHSABS 2.9 08/22/2017 1445   LYMPHSABS 2.8 09/05/2016 1010   MONOABS 0.9 08/22/2017 1445   EOSABS 0.3 08/22/2017 1445   EOSABS 0.5 (H) 09/05/2016 1010   BASOSABS 0.1 08/22/2017 1445   BASOSABS 0.1 09/05/2016 1010   Iron/TIBC/Ferritin/ %Sat No results found for: IRON, TIBC, FERRITIN, IRONPCTSAT Lipid Panel     Component Value Date/Time   CHOL 187 02/15/2017 0834   TRIG 100 02/15/2017 0834   HDL 37 (L) 02/15/2017 0834   CHOLHDL 4 03/20/2016 1708   VLDL 39.0 03/20/2016 1708   LDLCALC 130 (H) 02/15/2017 0834   LDLDIRECT 189.0 04/22/2015 1615   Hepatic Function Panel     Component Value Date/Time   PROT 7.1 08/17/2017 1706   PROT 7.4 02/15/2017 0834   ALBUMIN 4.0 08/07/2017 0838   ALBUMIN 4.7 02/15/2017 0834   AST 16 08/17/2017 1706   ALT 25 08/17/2017 1706   ALKPHOS 58 08/07/2017 0838   BILITOT 0.5 08/17/2017 1706   BILITOT 0.7 02/15/2017 0834   BILIDIR 0.1 02/19/2014 1048      Component Value Date/Time   TSH 2.210 09/05/2016 1010   TSH 0.44 10/27/2013 1103   TSH 1.17 11/22/2010 1447     Ref. Range 09/13/2017 11:13  Vitamin D, 25-Hydroxy Latest Ref Range: 30.0 - 100.0 ng/mL 42.6     OBESITY BEHAVIORAL INTERVENTION VISIT  Today's visit was # 25   Starting  weight: 416 lbs Starting date: 09/05/2016 Today's weight : 393 lbs  Today's date: 02/14/2018 Total lbs lost to date: 7523    ASK: We discussed the diagnosis of obesity with Sean GraffEvan D Deemer today and Derril agreed to give Sean [name] permission to discuss obesity behavioral modification therapy today.  ASSESS: Sean Pertvan has the diagnosis of obesity and his BMI today is 51.86 Sean Pertvan is in the action stage of change   ADVISE: Sean Pertvan was educated on the multiple health risks of obesity as well as the benefit of weight loss to improve his health. He was advised of the need for long term treatment and the importance of lifestyle modifications to improve his current health and to decrease his risk of future health problems.  AGREE: Multiple dietary modification options and treatment options were discussed and  Sean [name] agreed to follow the recommendations documented in the above note.  ARRANGE: Sean Pertvan was educated on the importance of frequent visits to treat obesity as outlined per CMS and USPSTF guidelines and agreed to schedule his next follow up appointment today.  I,  , am acting as Energy managertranscriptionist for Quillian Quincearen Beasley, MD  I have reviewed the above documentation for accuracy and completeness, and I agree with the above. -Quillian Quincearen Beasley, MD

## 2018-03-11 ENCOUNTER — Encounter (INDEPENDENT_AMBULATORY_CARE_PROVIDER_SITE_OTHER): Payer: Self-pay | Admitting: Family Medicine

## 2018-03-12 ENCOUNTER — Ambulatory Visit (INDEPENDENT_AMBULATORY_CARE_PROVIDER_SITE_OTHER): Payer: Self-pay | Admitting: Family Medicine

## 2018-03-12 ENCOUNTER — Encounter (INDEPENDENT_AMBULATORY_CARE_PROVIDER_SITE_OTHER): Payer: Self-pay

## 2018-03-12 NOTE — Telephone Encounter (Signed)
Please reschedule

## 2018-03-14 ENCOUNTER — Ambulatory Visit (INDEPENDENT_AMBULATORY_CARE_PROVIDER_SITE_OTHER): Payer: 59 | Admitting: Family Medicine

## 2018-03-14 VITALS — BP 121/78 | HR 88 | Temp 98.0°F | Ht 73.0 in | Wt 392.0 lb

## 2018-03-14 DIAGNOSIS — Z9189 Other specified personal risk factors, not elsewhere classified: Secondary | ICD-10-CM | POA: Diagnosis not present

## 2018-03-14 DIAGNOSIS — I10 Essential (primary) hypertension: Secondary | ICD-10-CM

## 2018-03-14 DIAGNOSIS — F3289 Other specified depressive episodes: Secondary | ICD-10-CM

## 2018-03-14 DIAGNOSIS — R600 Localized edema: Secondary | ICD-10-CM | POA: Diagnosis not present

## 2018-03-14 DIAGNOSIS — R7303 Prediabetes: Secondary | ICD-10-CM

## 2018-03-14 DIAGNOSIS — Z6841 Body Mass Index (BMI) 40.0 and over, adult: Secondary | ICD-10-CM

## 2018-03-14 DIAGNOSIS — E559 Vitamin D deficiency, unspecified: Secondary | ICD-10-CM

## 2018-03-14 DIAGNOSIS — E7849 Other hyperlipidemia: Secondary | ICD-10-CM | POA: Diagnosis not present

## 2018-03-14 DIAGNOSIS — E66813 Obesity, class 3: Secondary | ICD-10-CM

## 2018-03-14 MED ORDER — LIRAGLUTIDE 18 MG/3ML ~~LOC~~ SOPN: 1.2000 mg | PEN_INJECTOR | Freq: Every morning | SUBCUTANEOUS | 0 refills | Status: DC

## 2018-03-14 MED ORDER — ATORVASTATIN CALCIUM 20 MG PO TABS: 20.0000 mg | ORAL_TABLET | Freq: Every day | ORAL | 0 refills | Status: DC

## 2018-03-14 MED ORDER — BUPROPION HCL ER (SR) 200 MG PO TB12: 200.0000 mg | ORAL_TABLET | Freq: Every day | ORAL | 0 refills | Status: DC

## 2018-03-14 MED ORDER — CHLORTHALIDONE 25 MG PO TABS: 25.0000 mg | ORAL_TABLET | Freq: Every day | ORAL | 0 refills | Status: DC

## 2018-03-14 MED ORDER — VITAMIN D (ERGOCALCIFEROL) 1.25 MG (50000 UNIT) PO CAPS: 50000.0000 [IU] | ORAL_CAPSULE | ORAL | 0 refills | Status: DC

## 2018-03-14 MED ORDER — LOSARTAN POTASSIUM 100 MG PO TABS: 100.0000 mg | ORAL_TABLET | Freq: Every day | ORAL | 0 refills | Status: DC

## 2018-03-14 NOTE — Progress Notes (Signed)
Office: [phone]  /  Fax: [phone]   HPI:   Chief Complaint: OBESITY Sean [name] is here to discuss his progress with his obesity treatment plan. He is on the lower carbohydrate, vegetable and lean protein rich diet plan and is following his eating plan approximately 40 to 50 % of the time. He states he is exercising 0 minutes 0 times per week. Sean [name] is on Saxenda 1.2mg  daily and is using samples from his PCP because his insurance does not cover it. He feels Sean [name] helps with his appetite, but efficacy is wearing off. He sticks with his diet for about a week after a visit and then falls off.  His weight is (!) 392 lb (177.8 kg) today and has had a weight loss of 1 pound over a period of 4 weeks since his last visit. He has lost 24 lbs since starting treatment with Sean [name].  Decreased Extremity Edema Sean [name] has had severe lower extremity edema for 4 to 5 months. Edema subsides overnight as legs are elevated  He is supposed to wear a CPAP, but cannot comply with it. Sean [name] does not have edema today. He denies chest pain, unusual shortness of breath, or orthopnea.  Pre-Diabetes Sean [name] has a diagnosis of pre-diabetes based on his elevated Hgb A1c and was informed this puts him at greater risk of developing diabetes. His last A1c was 5.5 on 02/15/17, which is improved. He is taking metformin currently and continues to work on diet and exercise to decrease risk of diabetes. He admits polyphagia and denies hypoglycemia.  Depression with emotional eating behaviors Sean [name] feels that he must clean his plate when he is full. He admits cravings and snacks when he is not hungry. He is struggling with emotional eating and using food for comfort to the extent that it is negatively impacting his health.  Sean [name] sometimes feels he is out of control and then feels guilty that he made poor food choices. He has been working on behavior modification techniques to help reduce his emotional eating and has been somewhat successful.     Hyperlipidemia Sean [name] has hyperlipidemia and has been trying to improve his cholesterol levels with intensive lifestyle modification including a low saturated fat diet, exercise and weight loss. His last LDL was 130, HDL was 37, and triglycerides were 100 on 02/15/17. His LDL is not at goal. He is on atorvastatin denies any chest pain or shortness of breath.  Hypertension Sean [name] is a 43 y.o. male with hypertension. Sean [name]'s blood pressure is currently well controlled on amlodipine, chlorthalidone, and losartan. He is on a potassium supplement. He is working on weight loss to help control his blood pressure with the goal of decreasing his risk of heart attack and stroke. Sean [name] denies chest pain or shortness of breath on exertion.  Vitamin D deficiency Sean [name] has a diagnosis of vitamin D deficiency. He is currently taking vit D and is nearly at goal. His last vitamin D was 42.6 on 09/13/17. He denies nausea, vomiting, or muscle weakness.  ASSESSMENT AND PLAN:  Lower extremity edema - Plan: CBC With Differential, T3, T4, free, TSH, ECHOCARDIOGRAM COMPLETE  Prediabetes - Plan: Comprehensive metabolic panel, Hemoglobin A1c, Insulin, random, liraglutide (VICTOZA) 18 MG/3ML SOPN  Other hyperlipidemia - Plan: CBC With Differential, Lipid Panel With LDL/HDL Ratio, atorvastatin (LIPITOR) 20 MG tablet  Essential hypertension - Plan: chlorthalidone (HYGROTON) 25 MG tablet, losartan (COZAAR) 100 MG tablet  Vitamin D deficiency - Plan: VITAMIN D 25 Hydroxy (Vit-D Deficiency, Fractures), Vitamin D,  Ergocalciferol, (DRISDOL) 1.25 MG (50000 UT) CAPS capsule  Other depression - with emotional eating - Plan: buPROPion (WELLBUTRIN SR) 200 MG 12 hr tablet  At risk for heart disease  Class 3 severe obesity with serious comorbidity and body mass index (BMI) of 50.0 to 59.9 in adult, unspecified obesity type (HCC)  PLAN:  Decreased Extremity Edema We will order an echocardiogram for Sean [name] and the imaging  office will contact him with an appointment. Mylinda Latina Auth [phone] from 03/14/18-04/28/18). [place] will follow up as directed.  Pre-Diabetes Sean [name] will continue to work on weight loss, exercise, and decreasing simple carbohydrates in his diet to help decrease the risk of diabetes. He was informed that eating too many simple carbohydrates or too many calories at one sitting increases the likelihood of GI side effects. Sean [name] agreed to start a new prescription of Victoza 1.2 mg daily #3 pens with no refills and a prescription was written today. He will discontinue the Saxenda when he starts the Victoza.  Sean [name] agreed to follow up with [name] as directed to monitor his progress. An A1c and a fasting Insulin was drawn today. Dainel agreed to follow up in 4 weeks.  Hyperlipidemia Sean [name] was informed of the American Heart Association Guidelines emphasizing intensive lifestyle modifications as the first line treatment for hyperlipidemia. We discussed many lifestyle modifications today in depth, and Sean [name] will continue to work on decreasing saturated fats such as fatty red meat, butter and many fried foods. He will also increase vegetables and lean protein in his diet and continue to work on exercise and weight loss efforts. A FLP was ordered today. Sean [name] agrees to continue atorvastatin 20mg  qhs #30 with no refills and he will follow up in 4 weeks.  Hypertension We discussed sodium restriction, working on healthy weight loss, and a regular exercise program as the means to achieve improved blood pressure control. We will continue to monitor his blood pressure as well as his progress with the above lifestyle modifications. He will continue his losartan 100mg  qd #30 with no refills, chlorthalidone 25mg  qd #30 with no refills, and we will draw a CMET today. Sean [name] will watch for signs of hypotension as he continues his lifestyle modifications. Sean Pert agreed with this plan and agreed to follow up as directed.  Vitamin D  Deficiency Sean [name] was informed that low vitamin D levels contributes to fatigue and are associated with obesity, breast, and colon cancer. He agrees to continue to take prescription Vit D @50 ,000 IU every week #4 with no refills and will follow up for routine testing of vitamin D, at least 2-3 times per year. He was informed of the risk of over-replacement of vitamin D and agrees to not increase his dose unless he discusses this with [name] first. Sean [name] will follow up at the agreed upon time.  Depression with Emotional Eating Behaviors We discussed behavior modification techniques today to help Sean [name] deal with his emotional eating and depression. He has agreed to continue Wellbutrin SR 200mg  qAM #30 with no refills and agreed to follow up as directed.  Obesity Jeremias is currently in the action stage of change. As such, his goal is to continue with weight loss efforts. He has agreed to switch to follow the Category 4 plan with handouts provided. We discussed the following Behavioral Modification Strategies today: work on meal planning and easy cooking plans, keeping healthy foods in the home, and planning for success. Sean [name] has not been prescribed exercise at this time.  Sean [name] has agreed to follow  up with our clinic in 4 weeks. He was informed of the importance of frequent follow up visits to maximize his success with intensive lifestyle modifications for his multiple health conditions.  ALLERGIES: No Known Allergies  MEDICATIONS: Current Outpatient Medications on File Prior to Visit  Medication Sig Dispense Refill  . amLODipine (NORVASC) 10 MG tablet Take 1 tablet (10 mg total) by mouth daily. 30 tablet 0  . calcium-vitamin D 250-100 MG-UNIT tablet Take 1 tablet by mouth 2 (two) times daily.    . celecoxib (CELEBREX) 200 MG capsule Take 1 capsule (200 mg total) by mouth 2 (two) times daily. 30 capsule 5  . Glucosamine-Chondroitin-MSM (TRIPLE FLEX PO) Take by mouth.    Marland Kitchen. ibuprofen (ADVIL,MOTRIN) 800 MG  tablet Take 1 tablet (800 mg total) by mouth 3 (three) times daily. 21 tablet 0  . Liraglutide -Weight Management (SAXENDA) 18 MG/3ML SOPN Inject 3 mg into the skin daily.    . metFORMIN (GLUCOPHAGE) 500 MG tablet Take 1 tablet (500 mg total) by mouth 2 (two) times daily with a meal. 60 tablet 0  . potassium chloride SA (K-DUR,KLOR-CON) 20 MEQ tablet Take 1 tablet (20 mEq total) by mouth daily. 30 tablet 0  . Turmeric 500 MG TABS Take by mouth.     No current facility-administered medications on file prior to visit.     PAST MEDICAL HISTORY: Past Medical History:  Diagnosis Date  . Heartburn   . Hyperlipidemia   . Hypertension   . Sleep apnea   . SOB (shortness of breath) on exertion   . Swelling    feet and legs    PAST SURGICAL HISTORY: No past surgical history on file.  SOCIAL HISTORY: Social History   Tobacco Use  . Smoking status: Never Smoker  . Smokeless tobacco: Never Used  Substance Use Topics  . Alcohol use: Yes  . Drug use: No    FAMILY HISTORY: Family History  Problem Relation Age of Onset  . Heart disease Father 2556       MI  . Hypertension Father   . Sudden death Father        Heart disease  . Hyperlipidemia Father   . Obesity Father   . Diabetes Mother        Borderline  . Hyperlipidemia Mother   . Hypertension Mother   . Depression Mother   . Obesity Mother   . Breast cancer Maternal Grandmother     ROS: Review of Systems  Constitutional: Positive for weight loss.  Respiratory: Negative for shortness of breath.   Cardiovascular: Negative for chest pain and orthopnea.  Gastrointestinal: Negative for nausea and vomiting.  Musculoskeletal:       Negative for muscle weakness.  Endo/Heme/Allergies:       Positive for polyphagia. Negative for hyperglycemia.    PHYSICAL EXAM: Blood pressure 121/78, pulse 88, temperature 98 F (36.7 C), temperature source Oral, height 6\' 1"  (1.854 m), weight (!) 392 lb (177.8 kg), SpO2 98 %. Body mass index  is 51.72 kg/m. Physical Exam Vitals signs reviewed.  Constitutional:      Appearance: Normal appearance. He is obese.  Cardiovascular:     Rate and Rhythm: Normal rate.  Pulmonary:     Effort: Pulmonary effort is normal.  Musculoskeletal: Normal range of motion.     Right lower leg: No edema.     Left lower leg: No edema.     Comments: Positive for extremity edema.  Skin:    General: Skin  is warm and dry.  Neurological:     Mental Status: He is alert and oriented to person, place, and time.  Psychiatric:        Mood and Affect: Mood normal.        Behavior: Behavior normal.     RECENT LABS AND TESTS: BMET    Component Value Date/Time   NA 139 08/17/2017 1706   NA 139 02/15/2017 0834   K 3.8 08/17/2017 1706   CL 100 08/17/2017 1706   CO2 28 08/17/2017 1706   GLUCOSE 85 08/17/2017 1706   BUN 11 08/17/2017 1706   BUN 14 02/15/2017 0834   CREATININE 0.89 08/17/2017 1706   CALCIUM 9.5 08/17/2017 1706   GFRNONAA >60 08/07/2017 0838   GFRAA >60 08/07/2017 0838   Lab Results  Component Value Date   HGBA1C 5.5 02/15/2017   HGBA1C 5.7 (H) 09/05/2016   Lab Results  Component Value Date   INSULIN 36.9 (H) 02/15/2017   INSULIN 29.3 (H) 09/05/2016   CBC    Component Value Date/Time   WBC 10.9 (H) 08/22/2017 1445   RBC 5.00 08/22/2017 1445   HGB 14.5 08/22/2017 1445   HGB 15.2 09/05/2016 1010   HCT 42.2 08/22/2017 1445   HCT 44.5 09/05/2016 1010   PLT 300.0 08/22/2017 1445   PLT 249 09/05/2016 1010   MCV 84.5 08/22/2017 1445   MCV 83 09/05/2016 1010   MCH 28.5 08/17/2017 1706   MCHC 34.4 08/22/2017 1445   RDW 13.7 08/22/2017 1445   RDW 14.6 09/05/2016 1010   LYMPHSABS 2.9 08/22/2017 1445   LYMPHSABS 2.8 09/05/2016 1010   MONOABS 0.9 08/22/2017 1445   EOSABS 0.3 08/22/2017 1445   EOSABS 0.5 (H) 09/05/2016 1010   BASOSABS 0.1 08/22/2017 1445   BASOSABS 0.1 09/05/2016 1010   Iron/TIBC/Ferritin/ %Sat No results found for: IRON, TIBC, FERRITIN,  IRONPCTSAT Lipid Panel     Component Value Date/Time   CHOL 187 02/15/2017 0834   TRIG 100 02/15/2017 0834   HDL 37 (L) 02/15/2017 0834   CHOLHDL 4 03/20/2016 1708   VLDL 39.0 03/20/2016 1708   LDLCALC 130 (H) 02/15/2017 0834   LDLDIRECT 189.0 04/22/2015 1615   Hepatic Function Panel     Component Value Date/Time   PROT 7.1 08/17/2017 1706   PROT 7.4 02/15/2017 0834   ALBUMIN 4.0 08/07/2017 0838   ALBUMIN 4.7 02/15/2017 0834   AST 16 08/17/2017 1706   ALT 25 08/17/2017 1706   ALKPHOS 58 08/07/2017 0838   BILITOT 0.5 08/17/2017 1706   BILITOT 0.7 02/15/2017 0834   BILIDIR 0.1 02/19/2014 1048      Component Value Date/Time   TSH 2.210 09/05/2016 1010   TSH 0.44 10/27/2013 1103   TSH 1.17 11/22/2010 1447   Results for NICHOLIS, STEPANEK (MRN [id number]) as of 03/14/2018 14:25  Ref. Range 09/13/2017 11:13  Vitamin D, 25-Hydroxy Latest Ref Range: 30.0 - 100.0 ng/mL 42.6   OBESITY BEHAVIORAL INTERVENTION VISIT  Today's visit was # 26   Starting weight: 416 lbs Starting date: 09/05/16 Today's weight : Weight: (!) 392 lb (177.8 kg)  Today's date: 03/14/2018 Total lbs lost to date: 24  ASK: We discussed the diagnosis of obesity with Sean Graff today and Acxel agreed to give [name] permission to discuss obesity behavioral modification therapy today.  ASSESS: Kadarrius has the diagnosis of obesity and his BMI today is 51.7. Vikrant is in the action stage of change.   ADVISE: Dayvin was educated on the  multiple health risks of obesity as well as the benefit of weight loss to improve his health. He was advised of the need for long term treatment and the importance of lifestyle modifications to improve his current health and to decrease his risk of future health problems.  AGREE: Multiple dietary modification options and treatment options were discussed and Tupac agreed to follow the recommendations documented in the above note.  ARRANGE: Loyle was educated on the importance of frequent visits to  treat obesity as outlined per CMS and USPSTF guidelines and agreed to schedule his next follow up appointment today.  I, Kirke Corin, CMA, am acting as Energy manager for [place], FNP-C.  I have reviewed the above documentation for accuracy and completeness, and I agree with the above.  -  , FNP-C.

## 2018-03-15 LAB — CBC WITH DIFFERENTIAL
Basophils Absolute: 0.1 10*3/uL (ref 0.0–0.2)
Basos: 1 %
EOS (ABSOLUTE): 0.4 10*3/uL (ref 0.0–0.4)
Eos: 4 %
Hematocrit: 45.2 % (ref 37.5–51.0)
Hemoglobin: 15.6 g/dL (ref 13.0–17.7)
Immature Grans (Abs): 0.1 10*3/uL (ref 0.0–0.1)
Immature Granulocytes: 1 %
Lymphocytes Absolute: 2.9 10*3/uL (ref 0.7–3.1)
Lymphs: 29 %
MCH: 29.6 pg (ref 26.6–33.0)
MCHC: 34.5 g/dL (ref 31.5–35.7)
MCV: 86 fL (ref 79–97)
Monocytes Absolute: 0.9 10*3/uL (ref 0.1–0.9)
Monocytes: 9 %
Neutrophils Absolute: 5.5 10*3/uL (ref 1.4–7.0)
Neutrophils: 56 %
RBC: 5.27 x10E6/uL (ref 4.14–5.80)
RDW: 13.3 % (ref 11.6–15.4)
WBC: 9.8 10*3/uL (ref 3.4–10.8)

## 2018-03-15 LAB — COMPREHENSIVE METABOLIC PANEL
ALT: 24 IU/L (ref 0–44)
AST: 18 IU/L (ref 0–40)
Albumin/Globulin Ratio: 1.6 (ref 1.2–2.2)
Albumin: 4.4 g/dL (ref 4.0–5.0)
Alkaline Phosphatase: 54 IU/L (ref 39–117)
BUN/Creatinine Ratio: 13 (ref 9–20)
BUN: 12 mg/dL (ref 6–24)
Bilirubin Total: 0.7 mg/dL (ref 0.0–1.2)
CO2: 24 mmol/L (ref 20–29)
Calcium: 9.6 mg/dL (ref 8.7–10.2)
Chloride: 98 mmol/L (ref 96–106)
Creatinine, Ser: 0.95 mg/dL (ref 0.76–1.27)
GFR calc Af Amer: 113 mL/min/{1.73_m2} (ref >59)
GFR calc non Af Amer: 98 mL/min/{1.73_m2} (ref >59)
Globulin, Total: 2.7 g/dL (ref 1.5–4.5)
Glucose: 79 mg/dL (ref 65–99)
Potassium: 3.3 mmol/L — ABNORMAL LOW (ref 3.5–5.2)
Sodium: 141 mmol/L (ref 134–144)
Total Protein: 7.1 g/dL (ref 6.0–8.5)

## 2018-03-15 LAB — LIPID PANEL WITH LDL/HDL RATIO
Cholesterol, Total: 162 mg/dL (ref 100–199)
HDL: 41 mg/dL (ref >39)
LDL Calculated: 98 mg/dL (ref 0–99)
LDl/HDL Ratio: 2.4 ratio (ref 0.0–3.6)
Triglycerides: 115 mg/dL (ref 0–149)
VLDL Cholesterol Cal: 23 mg/dL (ref 5–40)

## 2018-03-15 LAB — HEMOGLOBIN A1C
Est. average glucose Bld gHb Est-mCnc: 111 mg/dL
Hgb A1c MFr Bld: 5.5 % (ref 4.8–5.6)

## 2018-03-15 LAB — INSULIN, RANDOM: INSULIN: 17.8 u[IU]/mL (ref 2.6–24.9)

## 2018-03-15 LAB — TSH: TSH: 2.74 u[IU]/mL (ref 0.450–4.500)

## 2018-03-15 LAB — VITAMIN D 25 HYDROXY (VIT D DEFICIENCY, FRACTURES): Vit D, 25-Hydroxy: 49.2 ng/mL (ref 30.0–100.0)

## 2018-03-15 LAB — T3: T3, Total: 113 ng/dL (ref 71–180)

## 2018-03-15 LAB — T4, FREE: Free T4: 1.39 ng/dL (ref 0.82–1.77)

## 2018-03-18 ENCOUNTER — Encounter (INDEPENDENT_AMBULATORY_CARE_PROVIDER_SITE_OTHER): Payer: Self-pay | Admitting: Family Medicine

## 2018-03-18 DIAGNOSIS — Z6841 Body Mass Index (BMI) 40.0 and over, adult: Secondary | ICD-10-CM

## 2018-03-18 NOTE — Progress Notes (Signed)
Patient states he is taking his potassium daily and will be prepared for lab work on his next visit. April, CMA

## 2018-03-19 ENCOUNTER — Encounter: Payer: Self-pay | Admitting: Family Medicine

## 2018-03-19 ENCOUNTER — Ambulatory Visit: Payer: 59 | Admitting: Family Medicine

## 2018-03-19 VITALS — BP 130/82 | HR 85 | Temp 99.0°F | Resp 18 | Ht 73.0 in | Wt >= 6400 oz

## 2018-03-19 DIAGNOSIS — I10 Essential (primary) hypertension: Secondary | ICD-10-CM | POA: Diagnosis not present

## 2018-03-19 DIAGNOSIS — Z6841 Body Mass Index (BMI) 40.0 and over, adult: Secondary | ICD-10-CM

## 2018-03-19 DIAGNOSIS — E785 Hyperlipidemia, unspecified: Secondary | ICD-10-CM

## 2018-03-19 DIAGNOSIS — R7303 Prediabetes: Secondary | ICD-10-CM

## 2018-03-19 MED ORDER — LIRAGLUTIDE -WEIGHT MANAGEMENT 18 MG/3ML ~~LOC~~ SOPN: 3.0000 mg | PEN_INJECTOR | Freq: Every day | SUBCUTANEOUS | 3 refills | Status: DC

## 2018-03-19 MED ORDER — INSULIN PEN NEEDLE 31G X 8 MM MISC: 2 refills | Status: DC

## 2018-03-19 NOTE — Progress Notes (Signed)
Patient ID: Sean [name], male    DOB: 1975/08/30  Age: 43 y.o. MRN: [id number]    Subjective:  Subjective  HPI Sean [name] presents for f/u for chol and bp.  He also has been going to healthy weight and wellness but it is costing a lot $100 a month. He also c/o redness in both low legs.  No pain in calfs.  No chest pain.    Review of Systems  Constitutional: Negative for appetite change, diaphoresis, fatigue and unexpected weight change.  Eyes: Negative for pain, redness and visual disturbance.  Respiratory: Negative for cough, chest tightness, shortness of breath and wheezing.   Cardiovascular: Negative for chest pain, palpitations and leg swelling.  Endocrine: Negative for cold intolerance, heat intolerance, polydipsia, polyphagia and polyuria.  Genitourinary: Negative for difficulty urinating, dysuria and frequency.  Neurological: Negative for dizziness, light-headedness, numbness and headaches.    History Past Medical History:  Diagnosis Date  . Heartburn   . Hyperlipidemia   . Hypertension   . Sleep apnea   . SOB (shortness of breath) on exertion   . Swelling    feet and legs    He has no past surgical history on file.   His family history includes Breast cancer in his maternal grandmother; Depression in his mother; Diabetes in his mother; Heart disease (age of onset: 65) in his father; Hyperlipidemia in his father and mother; Hypertension in his father and mother; Obesity in his father and mother; Sudden death in his father.He reports that he has never smoked. He has never used smokeless tobacco. He reports current alcohol use. He reports that he does not use drugs.  Current Outpatient Medications on File Prior to Visit  Medication Sig Dispense Refill  . amLODipine (NORVASC) 10 MG tablet Take 1 tablet (10 mg total) by mouth daily. 30 tablet 0  . atorvastatin (LIPITOR) 20 MG tablet Take 1 tablet (20 mg total) by mouth at bedtime. 30 tablet 0  . buPROPion (WELLBUTRIN SR)  200 MG 12 hr tablet Take 1 tablet (200 mg total) by mouth daily with breakfast. 30 tablet 0  . calcium-vitamin D 250-100 MG-UNIT tablet Take 1 tablet by mouth 2 (two) times daily.    . celecoxib (CELEBREX) 200 MG capsule Take 1 capsule (200 mg total) by mouth 2 (two) times daily. 30 capsule 5  . chlorthalidone (HYGROTON) 25 MG tablet Take 1 tablet (25 mg total) by mouth daily. 30 tablet 0  . Glucosamine-Chondroitin-MSM (TRIPLE FLEX PO) Take by mouth.    Marland Kitchen ibuprofen (ADVIL,MOTRIN) 800 MG tablet Take 1 tablet (800 mg total) by mouth 3 (three) times daily. 21 tablet 0  . losartan (COZAAR) 100 MG tablet Take 1 tablet (100 mg total) by mouth daily. 30 tablet 0  . metFORMIN (GLUCOPHAGE) 500 MG tablet Take 1 tablet (500 mg total) by mouth 2 (two) times daily with a meal. 60 tablet 0  . potassium chloride SA (K-DUR,KLOR-CON) 20 MEQ tablet Take 1 tablet (20 mEq total) by mouth daily. 30 tablet 0  . Turmeric 500 MG TABS Take by mouth.    . Vitamin D, Ergocalciferol, (DRISDOL) 1.25 MG (50000 UT) CAPS capsule Take 1 capsule (50,000 Units total) by mouth every 7 (seven) days. 4 capsule 0   No current facility-administered medications on file prior to visit.      Objective:  Objective  Physical Exam Vitals signs and nursing note reviewed.  Constitutional:      General: He is sleeping.  Appearance: He is well-developed.  HENT:     Head: Normocephalic and atraumatic.  Eyes:     Pupils: Pupils are equal, round, and reactive to light.  Neck:     Musculoskeletal: Normal range of motion and neck supple.     Thyroid: No thyromegaly.  Cardiovascular:     Rate and Rhythm: Normal rate and regular rhythm.     Heart sounds: No murmur.  Pulmonary:     Effort: Pulmonary effort is normal. No respiratory distress.     Breath sounds: Normal breath sounds. No wheezing or rales.  Chest:     Chest wall: No tenderness.  Musculoskeletal:        General: No tenderness.  Skin:    General: Skin is warm and dry.       Findings: No erythema.  Neurological:     Mental Status: He is oriented to person, place, and time.  Psychiatric:        Behavior: Behavior normal.        Thought Content: Thought content normal.        Judgment: Judgment normal.    BP 130/82 (Cuff Size: Large)   Pulse 85   Temp 99 F (37.2 C) (Oral)   Resp 18   Ht 6\' 1"  (1.854 m)   Wt (!) 400 lb 3.2 oz (181.5 kg)   SpO2 97%   BMI 52.80 kg/m  Wt Readings from Last 3 Encounters:  03/19/18 (!) 400 lb 3.2 oz (181.5 kg)  03/14/18 (!) 392 lb (177.8 kg)  02/14/18 (!) 393 lb (178.3 kg)     Lab Results  Component Value Date   WBC 9.8 03/14/2018   HGB 15.6 03/14/2018   HCT 45.2 03/14/2018   PLT 300.0 08/22/2017   GLUCOSE 79 03/14/2018   CHOL 162 03/14/2018   TRIG 115 03/14/2018   HDL 41 03/14/2018   LDLDIRECT 189.0 04/22/2015   LDLCALC 98 03/14/2018   ALT 24 03/14/2018   AST 18 03/14/2018   NA 141 03/14/2018   K 3.3 (L) 03/14/2018   CL 98 03/14/2018   CREATININE 0.95 03/14/2018   BUN 12 03/14/2018   CO2 24 03/14/2018   TSH 2.740 03/14/2018   HGBA1C 5.5 03/14/2018    Dg Chest 2 View  Result Date: 08/07/2017 CLINICAL DATA:  Cough, fever EXAM: CHEST - 2 VIEW COMPARISON:  None. FINDINGS: Heart and mediastinal contours are within normal limits. No focal opacities or effusions. No acute bony abnormality. IMPRESSION: No active cardiopulmonary disease. Electronically Signed   By: Charlett NoseKevin  Dover M.D.   On: 08/07/2017 09:03     Assessment & Plan:  Plan  I have discontinued Sean [name]'s liraglutide. I am also having him start on Insulin Pen Needle. Additionally, I am having him maintain his ibuprofen, celecoxib, Turmeric, calcium-vitamin D, Glucosamine-Chondroitin-MSM (TRIPLE FLEX PO), amLODipine, potassium chloride SA, metFORMIN, buPROPion, atorvastatin, chlorthalidone, losartan, Vitamin D (Ergocalciferol), and Liraglutide -Weight Management.  Meds ordered this encounter  Medications  . Liraglutide -Weight Management  (SAXENDA) 18 MG/3ML SOPN    Sig: Inject 3 mg into the skin daily.    Dispense:  15 mL    Refill:  3  . Insulin Pen Needle 31G X 8 MM MISC    Sig: To use daily with saxenda    Dispense:  100 each    Refill:  2    Problem List Items Addressed This Visit      Unprioritized   Class 3 severe obesity with serious comorbidity and  body mass index (BMI) of 50.0 to 59.9 in [facility])    Healthy weight and wellness Exercise Will try saxenda again       Relevant Medications   Liraglutide -Weight Management (SAXENDA) 18 MG/3ML SOPN   HTN (hypertension)    Well controlled, no changes to meds. Encouraged heart healthy diet such as the DASH diet and exercise as tolerated.       Hyperlipidemia    Tolerating statin, encouraged heart healthy diet, avoid trans fats, minimize simple carbs and saturated fats. Increase exercise as tolerated      Morbid obesity (HCC)   Relevant Medications   Liraglutide -Weight Management (SAXENDA) 18 MG/3ML SOPN   Insulin Pen Needle 31G X 8 MM MISC   Prediabetes - Primary    Check labs con't meds          Follow-up: Return in about 6 months (around 09/17/2018), or if symptoms worsen or fail to improve, for hypertension, hyperlipidemia.  Donato SchultzYvonne R Lowne Chase, DO

## 2018-03-19 NOTE — Assessment & Plan Note (Signed)
Tolerating statin, encouraged heart healthy diet, avoid trans fats, minimize simple carbs and saturated fats. Increase exercise as tolerated 

## 2018-03-19 NOTE — Assessment & Plan Note (Signed)
Well controlled, no changes to meds. Encouraged heart healthy diet such as the DASH diet and exercise as tolerated.  °

## 2018-03-19 NOTE — Assessment & Plan Note (Signed)
Healthy weight and wellness Exercise Will try saxenda again

## 2018-03-19 NOTE — Assessment & Plan Note (Signed)
Check labs con't meds 

## 2018-03-19 NOTE — Patient Instructions (Signed)
DASH Eating Plan  DASH stands for "Dietary Approaches to Stop Hypertension." The DASH eating plan is a healthy eating plan that has been shown to reduce high blood pressure (hypertension). It may also reduce your risk for type 2 diabetes, heart disease, and stroke. The DASH eating plan may also help with weight loss.  What are tips for following this plan?    General guidelines   Avoid eating more than 2,300 mg (milligrams) of salt (sodium) a day. If you have hypertension, you may need to reduce your sodium intake to 1,500 mg a day.   Limit alcohol intake to no more than 1 drink a day for nonpregnant women and 2 drinks a day for men. One drink equals 12 oz of beer, 5 oz of wine, or 1 oz of hard liquor.   Work with your health care provider to maintain a healthy body weight or to lose weight. Ask what an ideal weight is for you.   Get at least 30 minutes of exercise that causes your heart to beat faster (aerobic exercise) most days of the week. Activities may include walking, swimming, or biking.   Work with your health care provider or diet and nutrition specialist (dietitian) to adjust your eating plan to your individual calorie needs.  Reading food labels     Check food labels for the amount of sodium per serving. Choose foods with less than 5 percent of the Daily Value of sodium. Generally, foods with less than 300 mg of sodium per serving fit into this eating plan.   To find whole grains, look for the word "whole" as the first word in the ingredient list.  Shopping   Buy products labeled as "low-sodium" or "no salt added."   Buy fresh foods. Avoid canned foods and premade or frozen meals.  Cooking   Avoid adding salt when cooking. Use salt-free seasonings or herbs instead of table salt or sea salt. Check with your health care provider or pharmacist before using salt substitutes.   Do not fry foods. Cook foods using healthy methods such as baking, boiling, grilling, and broiling instead.   Cook with  heart-healthy oils, such as olive, canola, soybean, or sunflower oil.  Meal planning   Eat a balanced diet that includes:  ? 5 or more servings of fruits and vegetables each day. At each meal, try to fill half of your plate with fruits and vegetables.  ? Up to 6-8 servings of whole grains each day.  ? Less than 6 oz of lean meat, poultry, or fish each day. A 3-oz serving of meat is about the same size as a deck of cards. One egg equals 1 oz.  ? 2 servings of low-fat dairy each day.  ? A serving of nuts, seeds, or beans 5 times each week.  ? Heart-healthy fats. Healthy fats called Omega-3 fatty acids are found in foods such as flaxseeds and coldwater fish, like sardines, salmon, and mackerel.   Limit how much you eat of the following:  ? Canned or prepackaged foods.  ? Food that is high in trans fat, such as fried foods.  ? Food that is high in saturated fat, such as fatty meat.  ? Sweets, desserts, sugary drinks, and other foods with added sugar.  ? Full-fat dairy products.   Do not salt foods before eating.   Try to eat at least 2 vegetarian meals each week.   Eat more home-cooked food and less restaurant, buffet, and fast food.     When eating at a restaurant, ask that your food be prepared with less salt or no salt, if possible.  What foods are recommended?  The items listed may not be a complete list. Talk with your dietitian about what dietary choices are best for you.  Grains  Whole-grain or whole-wheat bread. Whole-grain or whole-wheat pasta. Brown rice. Oatmeal. Quinoa. Bulgur. Whole-grain and low-sodium cereals. Pita bread. Low-fat, low-sodium crackers. Whole-wheat flour tortillas.  Vegetables  Fresh or frozen vegetables (raw, steamed, roasted, or grilled). Low-sodium or reduced-sodium tomato and vegetable juice. Low-sodium or reduced-sodium tomato sauce and tomato paste. Low-sodium or reduced-sodium canned vegetables.  Fruits  All fresh, dried, or frozen fruit. Canned fruit in natural juice (without  added sugar).  Meat and other protein foods  Skinless chicken or turkey. Ground chicken or turkey. Pork with fat trimmed off. Fish and seafood. Egg whites. Dried beans, peas, or lentils. Unsalted nuts, nut butters, and seeds. Unsalted canned beans. Lean cuts of beef with fat trimmed off. Low-sodium, lean deli meat.  Dairy  Low-fat (1%) or fat-free (skim) milk. Fat-free, low-fat, or reduced-fat cheeses. Nonfat, low-sodium ricotta or cottage cheese. Low-fat or nonfat yogurt. Low-fat, low-sodium cheese.  Fats and oils  Soft margarine without trans fats. Vegetable oil. Low-fat, reduced-fat, or light mayonnaise and salad dressings (reduced-sodium). Canola, safflower, olive, soybean, and sunflower oils. Avocado.  Seasoning and other foods  Herbs. Spices. Seasoning mixes without salt. Unsalted popcorn and pretzels. Fat-free sweets.  What foods are not recommended?  The items listed may not be a complete list. Talk with your dietitian about what dietary choices are best for you.  Grains  Baked goods made with fat, such as croissants, muffins, or some breads. Dry pasta or rice meal packs.  Vegetables  Creamed or fried vegetables. Vegetables in a cheese sauce. Regular canned vegetables (not low-sodium or reduced-sodium). Regular canned tomato sauce and paste (not low-sodium or reduced-sodium). Regular tomato and vegetable juice (not low-sodium or reduced-sodium). Pickles. Olives.  Fruits  Canned fruit in a light or heavy syrup. Fried fruit. Fruit in cream or butter sauce.  Meat and other protein foods  Fatty cuts of meat. Ribs. Fried meat. Bacon. Sausage. Bologna and other processed lunch meats. Salami. Fatback. Hotdogs. Bratwurst. Salted nuts and seeds. Canned beans with added salt. Canned or smoked fish. Whole eggs or egg yolks. Chicken or turkey with skin.  Dairy  Whole or 2% milk, cream, and half-and-half. Whole or full-fat cream cheese. Whole-fat or sweetened yogurt. Full-fat cheese. Nondairy creamers. Whipped toppings.  Processed cheese and cheese spreads.  Fats and oils  Butter. Stick margarine. Lard. Shortening. Ghee. Bacon fat. Tropical oils, such as coconut, palm kernel, or palm oil.  Seasoning and other foods  Salted popcorn and pretzels. Onion salt, garlic salt, seasoned salt, table salt, and sea salt. Worcestershire sauce. Tartar sauce. Barbecue sauce. Teriyaki sauce. Soy sauce, including reduced-sodium. Steak sauce. Canned and packaged gravies. Fish sauce. Oyster sauce. Cocktail sauce. Horseradish that you find on the shelf. Ketchup. Mustard. Meat flavorings and tenderizers. Bouillon cubes. Hot sauce and Tabasco sauce. Premade or packaged marinades. Premade or packaged taco seasonings. Relishes. Regular salad dressings.  Where to find more information:   National Heart, Lung, and Blood Institute: www.nhlbi.nih.gov   American Heart Association: www.heart.org  Summary   The DASH eating plan is a healthy eating plan that has been shown to reduce high blood pressure (hypertension). It may also reduce your risk for type 2 diabetes, heart disease, and stroke.   With the   DASH eating plan, you should limit salt (sodium) intake to 2,300 mg a day. If you have hypertension, you may need to reduce your sodium intake to 1,500 mg a day.   When on the DASH eating plan, aim to eat more fresh fruits and vegetables, whole grains, lean proteins, low-fat dairy, and heart-healthy fats.   Work with your health care provider or diet and nutrition specialist (dietitian) to adjust your eating plan to your individual calorie needs.  This information is not intended to replace advice given to you by your health care provider. Make sure you discuss any questions you have with your health care provider.  Document Released: 01/12/2011 Document Revised: 01/17/2016 Document Reviewed: 01/17/2016  Elsevier Interactive Patient Education  2019 [employer] Inc[employer]

## 2018-03-25 ENCOUNTER — Telehealth: Payer: Self-pay

## 2018-03-25 NOTE — Telephone Encounter (Signed)
New message    Just an FYI. We have made several attempts to contact this patient including sending a letter to schedule or reschedule their echocardiogram. We will be removing the patient from the echo WQ.   Thank you 

## 2018-03-29 ENCOUNTER — Encounter: Payer: Self-pay | Admitting: Family Medicine

## 2018-04-11 ENCOUNTER — Ambulatory Visit (INDEPENDENT_AMBULATORY_CARE_PROVIDER_SITE_OTHER): Payer: 59 | Admitting: Family Medicine

## 2018-04-16 ENCOUNTER — Other Ambulatory Visit (INDEPENDENT_AMBULATORY_CARE_PROVIDER_SITE_OTHER): Payer: Self-pay | Admitting: Family Medicine

## 2018-04-16 DIAGNOSIS — I10 Essential (primary) hypertension: Secondary | ICD-10-CM

## 2018-05-08 ENCOUNTER — Encounter (INDEPENDENT_AMBULATORY_CARE_PROVIDER_SITE_OTHER): Payer: Self-pay

## 2018-05-24 ENCOUNTER — Encounter: Payer: Self-pay | Admitting: Family Medicine

## 2018-05-24 DIAGNOSIS — I10 Essential (primary) hypertension: Secondary | ICD-10-CM

## 2018-05-24 MED ORDER — CHLORTHALIDONE 25 MG PO TABS: 25.0000 mg | ORAL_TABLET | Freq: Every day | ORAL | 1 refills | Status: DC

## 2018-06-02 ENCOUNTER — Encounter: Payer: Self-pay | Admitting: Family Medicine

## 2018-06-02 DIAGNOSIS — I10 Essential (primary) hypertension: Secondary | ICD-10-CM

## 2018-06-03 MED ORDER — LOSARTAN POTASSIUM 100 MG PO TABS: 100.0000 mg | ORAL_TABLET | Freq: Every day | ORAL | 1 refills | Status: DC

## 2018-06-12 ENCOUNTER — Encounter: Payer: Self-pay | Admitting: Family Medicine

## 2018-06-13 NOTE — Telephone Encounter (Signed)
Dr Zola Button -- it looks like pt has been getting [facility]. Is it ok for [name] to refill or have pt contact weight loss clinic?

## 2018-06-14 ENCOUNTER — Other Ambulatory Visit: Payer: Self-pay | Admitting: Family Medicine

## 2018-06-14 DIAGNOSIS — E7849 Other hyperlipidemia: Secondary | ICD-10-CM

## 2018-06-14 DIAGNOSIS — F3289 Other specified depressive episodes: Secondary | ICD-10-CM

## 2018-06-14 MED ORDER — ATORVASTATIN CALCIUM 20 MG PO TABS: 20.0000 mg | ORAL_TABLET | Freq: Every day | ORAL | 1 refills | Status: DC

## 2018-06-14 MED ORDER — BUPROPION HCL ER (SR) 200 MG PO TB12: 200.0000 mg | ORAL_TABLET | Freq: Every day | ORAL | 1 refills | Status: DC

## 2018-08-21 ENCOUNTER — Other Ambulatory Visit: Payer: Self-pay

## 2018-08-21 ENCOUNTER — Encounter: Payer: Self-pay | Admitting: Family Medicine

## 2018-08-21 ENCOUNTER — Ambulatory Visit (INDEPENDENT_AMBULATORY_CARE_PROVIDER_SITE_OTHER): Payer: 59 | Admitting: Family Medicine

## 2018-08-21 VITALS — Wt 398.0 lb

## 2018-08-21 DIAGNOSIS — L03116 Cellulitis of left lower limb: Secondary | ICD-10-CM | POA: Diagnosis not present

## 2018-08-21 MED ORDER — CEPHALEXIN 500 MG PO CAPS: 500.0000 mg | ORAL_CAPSULE | Freq: Two times a day (BID) | ORAL | 0 refills | Status: DC

## 2018-08-21 NOTE — Progress Notes (Signed)
Virtual Visit via Telephone Note  I connected with Sean [name] on 08/21/18 at 10:30 AM EDT by telephone and verified that I am speaking with the correct person using two identifiers.  Location: Patient: work  Provider: home    I discussed the limitations, risks, security and privacy concerns of performing an evaluation and management service by telephone and the availability of in person appointments. I also discussed with the patient that there may be a patient responsible charge related to this service. The patient expressed understanding and agreed to proceed.   History of Present Illness: Pt is at work and c/o cellulitis --- he was working in his garden last week and over did it. He notied that night he got chills and L leg was  red.  He had low grade fever    He started taking amoxicillin that was left over.  it is better Observations/Objective: Pt is in NAD  No vitals obtained   Assessment and Plan: 1. Cellulitis of left lower extremity Elevated leg  Take abx F/u in office if symptoms do not improve or go to er if worsens  - cephALEXin (KEFLEX) 500 MG capsule; Take 1 capsule (500 mg total) by mouth 2 (two) times daily.  Dispense: 20 capsule; Refill: 0   Follow Up Instructions:    I discussed the assessment and treatment plan with the patient. The patient was provided an opportunity to ask questions and all were answered. The patient agreed with the plan and demonstrated an understanding of the instructions.   The patient was advised to call back or seek an in-person evaluation if the symptoms worsen or if the condition fails to improve as anticipated.  I provided 20 minutes of non-face-to-face time during this encounter.   Ann Held, DO

## 2018-09-16 ENCOUNTER — Other Ambulatory Visit: Payer: Self-pay

## 2018-09-17 ENCOUNTER — Ambulatory Visit: Payer: 59 | Admitting: Family Medicine

## 2018-09-17 ENCOUNTER — Encounter: Payer: Self-pay | Admitting: Family Medicine

## 2018-09-17 VITALS — BP 138/83 | HR 88 | Temp 98.2°F | Resp 18 | Ht 73.0 in | Wt >= 6400 oz

## 2018-09-17 DIAGNOSIS — E7849 Other hyperlipidemia: Secondary | ICD-10-CM

## 2018-09-17 DIAGNOSIS — F3289 Other specified depressive episodes: Secondary | ICD-10-CM

## 2018-09-17 DIAGNOSIS — N529 Male erectile dysfunction, unspecified: Secondary | ICD-10-CM | POA: Diagnosis not present

## 2018-09-17 DIAGNOSIS — E785 Hyperlipidemia, unspecified: Secondary | ICD-10-CM

## 2018-09-17 DIAGNOSIS — E559 Vitamin D deficiency, unspecified: Secondary | ICD-10-CM

## 2018-09-17 DIAGNOSIS — E876 Hypokalemia: Secondary | ICD-10-CM

## 2018-09-17 DIAGNOSIS — Z6841 Body Mass Index (BMI) 40.0 and over, adult: Secondary | ICD-10-CM

## 2018-09-17 DIAGNOSIS — R7303 Prediabetes: Secondary | ICD-10-CM

## 2018-09-17 DIAGNOSIS — I10 Essential (primary) hypertension: Secondary | ICD-10-CM | POA: Diagnosis not present

## 2018-09-17 MED ORDER — SILDENAFIL CITRATE 100 MG PO TABS: 50.0000 mg | ORAL_TABLET | Freq: Every day | ORAL | 11 refills | Status: DC | PRN

## 2018-09-17 MED ORDER — AMLODIPINE BESYLATE 10 MG PO TABS: 10.0000 mg | ORAL_TABLET | Freq: Every day | ORAL | 1 refills | Status: DC

## 2018-09-17 MED ORDER — POTASSIUM CHLORIDE CRYS ER 20 MEQ PO TBCR: 20.0000 meq | EXTENDED_RELEASE_TABLET | Freq: Every day | ORAL | 0 refills | Status: DC

## 2018-09-17 MED ORDER — VITAMIN D (ERGOCALCIFEROL) 1.25 MG (50000 UNIT) PO CAPS: 50000.0000 [IU] | ORAL_CAPSULE | ORAL | 0 refills | Status: DC

## 2018-09-17 MED ORDER — BUPROPION HCL ER (SR) 200 MG PO TB12: 200.0000 mg | ORAL_TABLET | Freq: Every day | ORAL | 1 refills | Status: DC

## 2018-09-17 MED ORDER — METFORMIN HCL 500 MG PO TABS: 500.0000 mg | ORAL_TABLET | Freq: Two times a day (BID) | ORAL | 0 refills | Status: DC

## 2018-09-17 MED ORDER — ATORVASTATIN CALCIUM 20 MG PO TABS: 20.0000 mg | ORAL_TABLET | Freq: Every day | ORAL | 1 refills | Status: DC

## 2018-09-17 MED ORDER — CHLORTHALIDONE 25 MG PO TABS: 25.0000 mg | ORAL_TABLET | Freq: Every day | ORAL | 1 refills | Status: DC

## 2018-09-17 NOTE — Assessment & Plan Note (Signed)
Tolerating statin, encouraged heart healthy diet, avoid trans fats, minimize simple carbs and saturated fats. Increase exercise as tolerated 

## 2018-09-17 NOTE — Progress Notes (Signed)
Patient ID: [name], male    DOB: January 29, 1976  Age: 43 y.o. MRN: [id number]    Subjective:  Subjective  HPI Sean [name] presents for f/u bp and cholesterol   No cholesterol   No complaints.  Pt is struggling with weight loss during covid   He is unable to exercise.    Review of Systems  Constitutional: Negative for appetite change, diaphoresis, fatigue and unexpected weight change.  Eyes: Negative for pain, redness and visual disturbance.  Respiratory: Negative for cough, chest tightness, shortness of breath and wheezing.   Cardiovascular: Negative for chest pain, palpitations and leg swelling.  Endocrine: Negative for cold intolerance, heat intolerance, polydipsia, polyphagia and polyuria.  Genitourinary: Negative for difficulty urinating, dysuria and frequency.  Neurological: Negative for dizziness, light-headedness, numbness and headaches.    History Past Medical History:  Diagnosis Date  . Heartburn   . Hyperlipidemia   . Hypertension   . Sleep apnea   . SOB (shortness of breath) on exertion   . Swelling    feet and legs    He has no past surgical history on file.   His family history includes Breast cancer in his maternal grandmother; Depression in his mother; Diabetes in his mother; Heart disease (age of onset: 68) in his father; Hyperlipidemia in his father and mother; Hypertension in his father and mother; Obesity in his father and mother; Sudden death in his father.He reports that he has never smoked. He has never used smokeless tobacco. He reports current alcohol use. He reports that he does not use drugs.  Current Outpatient Medications on File Prior to Visit  Medication Sig Dispense Refill  . ibuprofen (ADVIL,MOTRIN) 800 MG tablet Take 1 tablet (800 mg total) by mouth 3 (three) times daily. 21 tablet 0  . calcium-vitamin D 250-100 MG-UNIT tablet Take 1 tablet by mouth 2 (two) times daily.    . celecoxib (CELEBREX) 200 MG capsule Take 1 capsule (200 mg total)  by mouth 2 (two) times daily. (Patient not taking: Reported on 09/17/2018) 30 capsule 5  . cephALEXin (KEFLEX) 500 MG capsule Take 1 capsule (500 mg total) by mouth 2 (two) times daily. (Patient not taking: Reported on 09/17/2018) 20 capsule 0  . Glucosamine-Chondroitin-MSM (TRIPLE FLEX PO) Take by mouth.    . Insulin Pen Needle 31G X 8 MM MISC To use daily with saxenda (Patient not taking: Reported on 09/17/2018) 100 each 2  . Liraglutide -Weight Management (SAXENDA) 18 MG/3ML SOPN Inject 3 mg into the skin daily. (Patient not taking: Reported on 09/17/2018) 15 mL 3  . Turmeric 500 MG TABS Take by mouth.     No current facility-administered medications on file prior to visit.      Objective:  Objective  Physical Exam Vitals signs and nursing note reviewed.  Constitutional:      General: He is sleeping. He is not in acute distress.    Appearance: He is well-developed.  HENT:     Head: Normocephalic and atraumatic.  Eyes:     Pupils: Pupils are equal, round, and reactive to light.  Neck:     Musculoskeletal: Normal range of motion and neck supple.     Thyroid: No thyromegaly.  Cardiovascular:     Rate and Rhythm: Normal rate and regular rhythm.     Heart sounds: Normal heart sounds. No murmur.  Pulmonary:     Effort: Pulmonary effort is normal. No respiratory distress.     Breath sounds: Normal breath sounds. No  wheezing or rales.  Chest:     Chest wall: No tenderness.  Musculoskeletal:        General: No tenderness.  Skin:    General: Skin is warm and dry.  Neurological:     Mental Status: He is oriented to person, place, and time.  Psychiatric:        Behavior: Behavior normal.        Thought Content: Thought content normal.        Judgment: Judgment normal.    BP 138/83 (BP Location: Left Arm, Patient Position: Sitting, Cuff Size: Large)   Pulse 88   Temp 98.2 F (36.8 C) (Temporal)   Resp 18   Ht 6\' 1"  (1.854 m)   Wt (!) 406 lb 9.6 oz (184.4 kg)   SpO2 100%   BMI  53.64 kg/m  Wt Readings from Last 3 Encounters:  09/17/18 (!) 406 lb 9.6 oz (184.4 kg)  08/21/18 (!) 398 lb (180.5 kg)  03/19/18 (!) 400 lb 3.2 oz (181.5 kg)     Lab Results  Component Value Date   WBC 9.8 03/14/2018   HGB 15.6 03/14/2018   HCT 45.2 03/14/2018   PLT 300.0 08/22/2017   GLUCOSE 79 03/14/2018   CHOL 162 03/14/2018   TRIG 115 03/14/2018   HDL 41 03/14/2018   LDLDIRECT 189.0 04/22/2015   LDLCALC 98 03/14/2018   ALT 24 03/14/2018   AST 18 03/14/2018   NA 141 03/14/2018   K 3.3 (L) 03/14/2018   CL 98 03/14/2018   CREATININE 0.95 03/14/2018   BUN 12 03/14/2018   CO2 24 03/14/2018   TSH 2.740 03/14/2018   HGBA1C 5.5 03/14/2018    Dg Chest 2 View  Result Date: 08/07/2017 CLINICAL DATA:  Cough, fever EXAM: CHEST - 2 VIEW COMPARISON:  None. FINDINGS: Heart and mediastinal contours are within normal limits. No focal opacities or effusions. No acute bony abnormality. IMPRESSION: No active cardiopulmonary disease. Electronically Signed   By: Charlett NoseKevin  Dover M.D.   On: 08/07/2017 09:03     Assessment & Plan:  Plan  I have discontinued Sean [name]'s losartan. I have also changed his potassium chloride SA. Additionally, I am having him maintain his ibuprofen, celecoxib, Turmeric, calcium-vitamin D, Glucosamine-Chondroitin-MSM (TRIPLE FLEX PO), Liraglutide -Weight Management, Insulin Pen Needle, cephALEXin, amLODipine, sildenafil, atorvastatin, buPROPion, chlorthalidone, metFORMIN, and Vitamin D (Ergocalciferol).  Meds ordered this encounter  Medications  . amLODipine (NORVASC) 10 MG tablet    Sig: Take 1 tablet (10 mg total) by mouth daily.    Dispense:  90 tablet    Refill:  1  . DISCONTD: sildenafil (VIAGRA) 100 MG tablet    Sig: Take 0.5-1 tablets (50-100 mg total) by mouth daily as needed for erectile dysfunction.    Dispense:  5 tablet    Refill:  11  . sildenafil (VIAGRA) 100 MG tablet    Sig: Take 0.5-1 tablets (50-100 mg total) by mouth daily as needed for  erectile dysfunction.    Dispense:  5 tablet    Refill:  11  . atorvastatin (LIPITOR) 20 MG tablet    Sig: Take 1 tablet (20 mg total) by mouth at bedtime.    Dispense:  90 tablet    Refill:  1  . buPROPion (WELLBUTRIN SR) 200 MG 12 hr tablet    Sig: Take 1 tablet (200 mg total) by mouth daily with breakfast.    Dispense:  90 tablet    Refill:  1  . chlorthalidone (HYGROTON)  25 MG tablet    Sig: Take 1 tablet (25 mg total) by mouth daily.    Dispense:  90 tablet    Refill:  1  . metFORMIN (GLUCOPHAGE) 500 MG tablet    Sig: Take 1 tablet (500 mg total) by mouth 2 (two) times daily with a meal.    Dispense:  60 tablet    Refill:  0  . potassium chloride SA (K-DUR) 20 MEQ tablet    Sig: Take 1 tablet (20 mEq total) by mouth daily.    Dispense:  30 tablet    Refill:  0  . Vitamin D, Ergocalciferol, (DRISDOL) 1.25 MG (50000 UT) CAPS capsule    Sig: Take 1 capsule (50,000 Units total) by mouth every 7 (seven) days.    Dispense:  4 capsule    Refill:  0    Problem List Items Addressed This Visit      Unprioritized   Class 3 severe obesity with serious comorbidity and body mass index (BMI) of 50.0 to 59.9 in adult [facility](HCC)    Encouraged diet and exercise Pt is unable to go back to healthy weight and wellness       Relevant Medications   metFORMIN (GLUCOPHAGE) 500 MG tablet   Depression   Relevant Medications   buPROPion (WELLBUTRIN SR) 200 MG 12 hr tablet   HTN (hypertension) - Primary    Well controlled, no changes to meds. Encouraged heart healthy diet such as the DASH diet and exercise as tolerated.       Relevant Medications   amLODipine (NORVASC) 10 MG tablet   sildenafil (VIAGRA) 100 MG tablet   atorvastatin (LIPITOR) 20 MG tablet   chlorthalidone (HYGROTON) 25 MG tablet   Other Relevant Orders   Lipid panel   Comprehensive metabolic panel   Hyperlipidemia    Tolerating statin, encouraged heart healthy diet, avoid trans fats, minimize simple carbs and saturated fats.  Increase exercise as tolerated      Relevant Medications   amLODipine (NORVASC) 10 MG tablet   sildenafil (VIAGRA) 100 MG tablet   atorvastatin (LIPITOR) 20 MG tablet   chlorthalidone (HYGROTON) 25 MG tablet   Other Relevant Orders   Lipid panel   Comprehensive metabolic panel   Hypokalemia   Relevant Medications   potassium chloride SA (K-DUR) 20 MEQ tablet   Prediabetes   Relevant Medications   metFORMIN (GLUCOPHAGE) 500 MG tablet   Vitamin D deficiency   Relevant Medications   Vitamin D, Ergocalciferol, (DRISDOL) 1.25 MG (50000 UT) CAPS capsule    Other Visit Diagnoses    Erectile dysfunction, unspecified erectile dysfunction type       Relevant Medications   sildenafil (VIAGRA) 100 MG tablet   Other Relevant Orders   Testos,Total,Free and SHBG (Male)      Follow-up: Return in about 2 weeks (around 10/01/2018), or if symptoms worsen or fail to improve, for bp check .  Donato SchultzYvonne R Lowne Chase, DO

## 2018-09-17 NOTE — Assessment & Plan Note (Signed)
Encouraged diet and exercise Pt is unable to go back to healthy weight and wellness

## 2018-09-17 NOTE — Patient Instructions (Signed)

## 2018-09-17 NOTE — Assessment & Plan Note (Signed)
Well controlled, no changes to meds. Encouraged heart healthy diet such as the DASH diet and exercise as tolerated.  °

## 2018-09-18 LAB — COMPREHENSIVE METABOLIC PANEL
ALT: 27 U/L (ref 0–53)
AST: 20 U/L (ref 0–37)
Albumin: 4.2 g/dL (ref 3.5–5.2)
Alkaline Phosphatase: 56 U/L (ref 39–117)
BUN: 14 mg/dL (ref 6–23)
CO2: 31 mEq/L (ref 19–32)
Calcium: 9.4 mg/dL (ref 8.4–10.5)
Chloride: 103 mEq/L (ref 96–112)
Creatinine, Ser: 1.05 mg/dL (ref 0.40–1.50)
GFR: 76.83 mL/min (ref >60.00)
Glucose, Bld: 84 mg/dL (ref 70–99)
Potassium: 3.6 mEq/L (ref 3.5–5.1)
Sodium: 141 mEq/L (ref 135–145)
Total Bilirubin: 0.5 mg/dL (ref 0.2–1.2)
Total Protein: 6.9 g/dL (ref 6.0–8.3)

## 2018-09-18 LAB — LIPID PANEL
Cholesterol: 194 mg/dL (ref 0–200)
HDL: 42.4 mg/dL (ref >39.00)
LDL Cholesterol: 122 mg/dL — ABNORMAL HIGH (ref 0–99)
NonHDL: 151.24
Total CHOL/HDL Ratio: 5
Triglycerides: 145 mg/dL (ref 0.0–149.0)
VLDL: 29 mg/dL (ref 0.0–40.0)

## 2018-09-20 LAB — TESTOS,TOTAL,FREE AND SHBG (FEMALE)
Free Testosterone: 48.9 pg/mL (ref 35.0–155.0)
Sex Hormone Binding: 42 nmol/L (ref 10–50)
Testosterone, Total, LC-MS-MS: 388 ng/dL (ref 250–1100)

## 2018-09-22 ENCOUNTER — Other Ambulatory Visit: Payer: Self-pay | Admitting: Family Medicine

## 2018-09-22 DIAGNOSIS — E785 Hyperlipidemia, unspecified: Secondary | ICD-10-CM

## 2018-09-22 MED ORDER — ATORVASTATIN CALCIUM 40 MG PO TABS: 40.0000 mg | ORAL_TABLET | Freq: Every day | ORAL | 3 refills | Status: DC

## 2018-10-01 ENCOUNTER — Ambulatory Visit: Payer: 59 | Admitting: Family Medicine

## 2018-10-08 ENCOUNTER — Ambulatory Visit: Payer: 59 | Admitting: Family Medicine

## 2018-10-08 ENCOUNTER — Telehealth: Payer: Self-pay | Admitting: *Deleted

## 2018-10-08 DIAGNOSIS — Z0289 Encounter for other administrative examinations: Secondary | ICD-10-CM

## 2018-10-08 NOTE — Telephone Encounter (Signed)
Left message on machine about missed appt today and to call back with status about blood pressures.

## 2018-12-24 ENCOUNTER — Telehealth: Payer: Self-pay | Admitting: *Deleted

## 2018-12-24 NOTE — Telephone Encounter (Signed)
Left message on vm for patient to call back so we can get him on the schedule for a virtual tomorrow.

## 2018-12-24 NOTE — Telephone Encounter (Signed)
Mychart message sent from mom chart This is for [employer]..he can't get in to my chart.  His blood pressure medication the last one you changed him to which he thinks is amlodipine 10 mg is causing him to be retaining a lot of fluid.  His legs are swollen..socks leaving pitting.  He is concerned about this.  His # is [id number]...date of birth 18-Oct-1975

## 2018-12-25 ENCOUNTER — Encounter: Payer: Self-pay | Admitting: Family Medicine

## 2018-12-25 ENCOUNTER — Other Ambulatory Visit: Payer: Self-pay

## 2018-12-25 ENCOUNTER — Ambulatory Visit (INDEPENDENT_AMBULATORY_CARE_PROVIDER_SITE_OTHER): Payer: 59 | Admitting: Family Medicine

## 2018-12-25 DIAGNOSIS — I10 Essential (primary) hypertension: Secondary | ICD-10-CM

## 2018-12-25 DIAGNOSIS — E8881 Metabolic syndrome: Secondary | ICD-10-CM | POA: Diagnosis not present

## 2018-12-25 DIAGNOSIS — E785 Hyperlipidemia, unspecified: Secondary | ICD-10-CM | POA: Diagnosis not present

## 2018-12-25 DIAGNOSIS — E88819 Insulin resistance, unspecified: Secondary | ICD-10-CM

## 2018-12-25 MED ORDER — METOPROLOL SUCCINATE ER 50 MG PO TB24: 50.0000 mg | ORAL_TABLET | Freq: Every day | ORAL | 3 refills | Status: DC

## 2018-12-25 MED ORDER — AMLODIPINE BESYLATE 5 MG PO TABS: 5.0000 mg | ORAL_TABLET | Freq: Every day | ORAL | 2 refills | Status: DC

## 2018-12-25 NOTE — Telephone Encounter (Signed)
Patient has an appointment for today

## 2018-12-25 NOTE — Progress Notes (Signed)
Virtual Visit via Telephone Note  I connected with Sean [name] on 12/25/18 at  8:00 AM EST by telephone and verified that I am speaking with the correct person using two identifiers.  Location: Patient: work  Provider: home    I discussed the limitations, risks, security and privacy concerns of performing an evaluation and management service by telephone and the availability of in person appointments. I also discussed with the patient that there may be a patient responsible charge related to this service. The patient expressed understanding and agreed to proceed.   History of Present Illness: Pt is at work and c/o norvasc causing swelling in his ankles    No other complaints    Observations/Objective:  no vitals obtained today but pt states bp has still been running high Pt is in NAD  Assessment and Plan: 1. Essential hypertension Poorly controlled will alter medications, encouraged DASH diet, minimize caffeine and obtain adequate sleep. Report concerning symptoms and follow up as directed and as needed - amLODipine (NORVASC) 5 MG tablet; Take 1 tablet (5 mg total) by mouth daily.  Dispense: 30 tablet; Refill: 2 - metoprolol succinate (TOPROL-XL) 50 MG 24 hr tablet; Take 1 tablet (50 mg total) by mouth daily. Take with or immediately following a meal.  Dispense: 90 tablet; Refill: 3 - Comprehensive metabolic panel; Future  2. Hyperlipidemia, unspecified hyperlipidemia type Tolerating statin, encouraged heart healthy diet, avoid trans fats, minimize simple carbs and saturated fats. Increase exercise as tolerated - Lipid panel; Future  3. Insulin resistance con't metformin Discussed diet and exercise  - Hemoglobin A1c; Future - Comprehensive metabolic panel; Future   Follow Up Instructions:    I discussed the assessment and treatment plan with the patient. The patient was provided an opportunity to ask questions and all were answered. The patient agreed with the plan and  demonstrated an understanding of the instructions.   The patient was advised to call back or seek an in-person evaluation if the symptoms worsen or if the condition fails to improve as anticipated.  I provided 15 minutes of non-face-to-face time during this encounter.   Ann Held, DO

## 2019-04-06 ENCOUNTER — Other Ambulatory Visit: Payer: Self-pay | Admitting: Family Medicine

## 2019-04-06 DIAGNOSIS — I10 Essential (primary) hypertension: Secondary | ICD-10-CM

## 2019-04-07 ENCOUNTER — Telehealth: Payer: Self-pay | Admitting: Family Medicine

## 2019-04-07 NOTE — Telephone Encounter (Signed)
I can do skin tags--- but if he want s derm --- [facility]

## 2019-04-07 NOTE — Telephone Encounter (Signed)
Please advise 

## 2019-04-07 NOTE — Telephone Encounter (Signed)
Refill sent.

## 2019-04-07 NOTE — Telephone Encounter (Signed)
Patient would like a recommendation /referral to demonologist . Patient has some skin tags that he would like removed.

## 2019-04-07 NOTE — Telephone Encounter (Signed)
Pt scheduled for Thursday to remove skin tags

## 2019-04-07 NOTE — Telephone Encounter (Signed)
Medication: amLODipine (NORVASC) 5 MG tablet    Has the patient contacted their pharmacy? Yes.   (If no, request that the patient contact the pharmacy for the refill.) (If yes, when and what did the pharmacy advise?)  Preferred Pharmacy (with phone number or street name): [id number] - [place], [place] - [address]  [address], [place] [address]  Phone:  [phone] Fax:  [phone]  DEA #:  [phone]  Agent: Please be advised that RX refills may take up to 3 business days. We ask that you follow-up with your pharmacy.

## 2019-04-09 ENCOUNTER — Other Ambulatory Visit: Payer: Self-pay

## 2019-04-10 ENCOUNTER — Encounter: Payer: Self-pay | Admitting: Family Medicine

## 2019-04-10 ENCOUNTER — Other Ambulatory Visit: Payer: Self-pay

## 2019-04-10 ENCOUNTER — Ambulatory Visit: Payer: 59 | Admitting: Family Medicine

## 2019-04-10 VITALS — BP 142/100 | HR 82 | Temp 97.0°F | Resp 18 | Ht 73.0 in | Wt >= 6400 oz

## 2019-04-10 DIAGNOSIS — L918 Other hypertrophic disorders of the skin: Secondary | ICD-10-CM

## 2019-04-10 DIAGNOSIS — E1165 Type 2 diabetes mellitus with hyperglycemia: Secondary | ICD-10-CM | POA: Diagnosis not present

## 2019-04-10 DIAGNOSIS — I10 Essential (primary) hypertension: Secondary | ICD-10-CM

## 2019-04-10 DIAGNOSIS — E1169 Type 2 diabetes mellitus with other specified complication: Secondary | ICD-10-CM | POA: Diagnosis not present

## 2019-04-10 DIAGNOSIS — E785 Hyperlipidemia, unspecified: Secondary | ICD-10-CM | POA: Diagnosis not present

## 2019-04-10 MED ORDER — METOPROLOL SUCCINATE ER 50 MG PO TB24: 50.0000 mg | ORAL_TABLET | Freq: Every day | ORAL | 3 refills | Status: DC

## 2019-04-10 MED ORDER — METOPROLOL SUCCINATE ER 100 MG PO TB24: 100.0000 mg | ORAL_TABLET | Freq: Every day | ORAL | 3 refills | Status: DC

## 2019-04-10 NOTE — Patient Instructions (Signed)
DASH Eating Plan DASH stands for "Dietary Approaches to Stop Hypertension." The DASH eating plan is a healthy eating plan that has been shown to reduce high blood pressure (hypertension). It may also reduce your risk for type 2 diabetes, heart disease, and stroke. The DASH eating plan may also help with weight loss. What are tips for following this plan?  General guidelines  Avoid eating more than 2,300 mg (milligrams) of salt (sodium) a day. If you have hypertension, you may need to reduce your sodium intake to 1,500 mg a day.  Limit alcohol intake to no more than 1 drink a day for nonpregnant women and 2 drinks a day for men. One drink equals 12 oz of beer, 5 oz of wine, or 1 oz of hard liquor.  Work with your health care provider to maintain a healthy body weight or to lose weight. Ask what an ideal weight is for you.  Get at least 30 minutes of exercise that causes your heart to beat faster (aerobic exercise) most days of the week. Activities may include walking, swimming, or biking.  Work with your health care provider or diet and nutrition specialist (dietitian) to adjust your eating plan to your individual calorie needs. Reading food labels   Check food labels for the amount of sodium per serving. Choose foods with less than 5 percent of the Daily Value of sodium. Generally, foods with less than 300 mg of sodium per serving fit into this eating plan.  To find whole grains, look for the word "whole" as the first word in the ingredient list. Shopping  Buy products labeled as "low-sodium" or "no salt added."  Buy fresh foods. Avoid canned foods and premade or frozen meals. Cooking  Avoid adding salt when cooking. Use salt-free seasonings or herbs instead of table salt or sea salt. Check with your health care provider or pharmacist before using salt substitutes.  Do not fry foods. Cook foods using healthy methods such as baking, boiling, grilling, and broiling instead.  Cook with  heart-healthy oils, such as olive, canola, soybean, or sunflower oil. Meal planning  Eat a balanced diet that includes: ? 5 or more servings of fruits and vegetables each day. At each meal, try to fill half of your plate with fruits and vegetables. ? Up to 6-8 servings of whole grains each day. ? Less than 6 oz of lean meat, poultry, or fish each day. A 3-oz serving of meat is about the same size as a deck of cards. One egg equals 1 oz. ? 2 servings of low-fat dairy each day. ? A serving of nuts, seeds, or beans 5 times each week. ? Heart-healthy fats. Healthy fats called Omega-3 fatty acids are found in foods such as flaxseeds and coldwater fish, like sardines, salmon, and mackerel.  Limit how much you eat of the following: ? Canned or prepackaged foods. ? Food that is high in trans fat, such as fried foods. ? Food that is high in saturated fat, such as fatty meat. ? Sweets, desserts, sugary drinks, and other foods with added sugar. ? Full-fat dairy products.  Do not salt foods before eating.  Try to eat at least 2 vegetarian meals each week.  Eat more home-cooked food and less restaurant, buffet, and fast food.  When eating at a restaurant, ask that your food be prepared with less salt or no salt, if possible. What foods are recommended? The items listed may not be a complete list. Talk with your dietitian about   what dietary choices are best for you. Grains Whole-grain or whole-wheat bread. Whole-grain or whole-wheat pasta. Brown rice. Oatmeal. Quinoa. Bulgur. Whole-grain and low-sodium cereals. Pita bread. Low-fat, low-sodium crackers. Whole-wheat flour tortillas. Vegetables Fresh or frozen vegetables (raw, steamed, roasted, or grilled). Low-sodium or reduced-sodium tomato and vegetable juice. Low-sodium or reduced-sodium tomato sauce and tomato paste. Low-sodium or reduced-sodium canned vegetables. Fruits All fresh, dried, or frozen fruit. Canned fruit in natural juice (without  added sugar). Meat and other protein foods Skinless chicken or turkey. Ground chicken or turkey. Pork with fat trimmed off. Fish and seafood. Egg whites. Dried beans, peas, or lentils. Unsalted nuts, nut butters, and seeds. Unsalted canned beans. Lean cuts of beef with fat trimmed off. Low-sodium, lean deli meat. Dairy Low-fat (1%) or fat-free (skim) milk. Fat-free, low-fat, or reduced-fat cheeses. Nonfat, low-sodium ricotta or cottage cheese. Low-fat or nonfat yogurt. Low-fat, low-sodium cheese. Fats and oils Soft margarine without trans fats. Vegetable oil. Low-fat, reduced-fat, or light mayonnaise and salad dressings (reduced-sodium). Canola, safflower, olive, soybean, and sunflower oils. Avocado. Seasoning and other foods Herbs. Spices. Seasoning mixes without salt. Unsalted popcorn and pretzels. Fat-free sweets. What foods are not recommended? The items listed may not be a complete list. Talk with your dietitian about what dietary choices are best for you. Grains Baked goods made with fat, such as croissants, muffins, or some breads. Dry pasta or rice meal packs. Vegetables Creamed or fried vegetables. Vegetables in a cheese sauce. Regular canned vegetables (not low-sodium or reduced-sodium). Regular canned tomato sauce and paste (not low-sodium or reduced-sodium). Regular tomato and vegetable juice (not low-sodium or reduced-sodium). Pickles. Olives. Fruits Canned fruit in a light or heavy syrup. Fried fruit. Fruit in cream or butter sauce. Meat and other protein foods Fatty cuts of meat. Ribs. Fried meat. Bacon. Sausage. Bologna and other processed lunch meats. Salami. Fatback. Hotdogs. Bratwurst. Salted nuts and seeds. Canned beans with added salt. Canned or smoked fish. Whole eggs or egg yolks. Chicken or turkey with skin. Dairy Whole or 2% milk, cream, and half-and-half. Whole or full-fat cream cheese. Whole-fat or sweetened yogurt. Full-fat cheese. Nondairy creamers. Whipped toppings.  Processed cheese and cheese spreads. Fats and oils Butter. Stick margarine. Lard. Shortening. Ghee. Bacon fat. Tropical oils, such as coconut, palm kernel, or palm oil. Seasoning and other foods Salted popcorn and pretzels. Onion salt, garlic salt, seasoned salt, table salt, and sea salt. Worcestershire sauce. Tartar sauce. Barbecue sauce. Teriyaki sauce. Soy sauce, including reduced-sodium. Steak sauce. Canned and packaged gravies. Fish sauce. Oyster sauce. Cocktail sauce. Horseradish that you find on the shelf. Ketchup. Mustard. Meat flavorings and tenderizers. Bouillon cubes. Hot sauce and Tabasco sauce. Premade or packaged marinades. Premade or packaged taco seasonings. Relishes. Regular salad dressings. Where to find more information:  National Heart, Lung, and Blood Institute: www.nhlbi.nih.gov  American Heart Association: www.heart.org Summary  The DASH eating plan is a healthy eating plan that has been shown to reduce high blood pressure (hypertension). It may also reduce your risk for type 2 diabetes, heart disease, and stroke.  With the DASH eating plan, you should limit salt (sodium) intake to 2,300 mg a day. If you have hypertension, you may need to reduce your sodium intake to 1,500 mg a day.  When on the DASH eating plan, aim to eat more fresh fruits and vegetables, whole grains, lean proteins, low-fat dairy, and heart-healthy fats.  Work with your health care provider or diet and nutrition specialist (dietitian) to adjust your eating plan to your   individual calorie needs. This information is not intended to replace advice given to you by your health care provider. Make sure you discuss any questions you have with your health care provider. Document Revised: 01/05/2017 Document Reviewed: 01/17/2016 Elsevier Patient Education  2020 [employer] Inc.  

## 2019-04-10 NOTE — Assessment & Plan Note (Signed)
Poorly controlled will alter medications, encouraged DASH diet, minimize caffeine and obtain adequate sleep. Report concerning symptoms and follow up as directed and as needed 

## 2019-04-10 NOTE — Assessment & Plan Note (Signed)
Skin tags around neck and back We will remove them when he comes in in 2-3 weeks

## 2019-04-10 NOTE — Progress Notes (Signed)
Patient ID: Sean [name], male    DOB: 05-16-1975  Age: 44 y.o. MRN: [id number]    Subjective:  Subjective  HPI Sean [name] presents for skin tags but his bp is running really high today No other complaints  Review of Systems  Constitutional: Negative for appetite change, diaphoresis, fatigue and unexpected weight change.  Eyes: Negative for pain, redness and visual disturbance.  Respiratory: Negative for cough, chest tightness, shortness of breath and wheezing.   Cardiovascular: Negative for chest pain, palpitations and leg swelling.  Endocrine: Negative for cold intolerance, heat intolerance, polydipsia, polyphagia and polyuria.  Genitourinary: Negative for difficulty urinating, dysuria and frequency.  Neurological: Negative for dizziness, light-headedness, numbness and headaches.    History Past Medical History:  Diagnosis Date  . Heartburn   . Hyperlipidemia   . Hypertension   . Sleep apnea   . SOB (shortness of breath) on exertion   . Swelling    feet and legs    He has no past surgical history on file.   His family history includes Breast cancer in his maternal grandmother; Depression in his mother; Diabetes in his mother; Heart disease (age of onset: 13) in his father; Hyperlipidemia in his father and mother; Hypertension in his father and mother; Obesity in his father and mother; Sudden death in his father.He reports that he has never smoked. He has never used smokeless tobacco. He reports current alcohol use. He reports that he does not use drugs.  Current Outpatient Medications on File Prior to Visit  Medication Sig Dispense Refill  . atorvastatin (LIPITOR) 40 MG tablet Take 1 tablet (40 mg total) by mouth daily. 90 tablet 3  . calcium-vitamin D 250-100 MG-UNIT tablet Take 1 tablet by mouth 2 (two) times daily.    . chlorthalidone (HYGROTON) 25 MG tablet Take 1 tablet (25 mg total) by mouth daily. 90 tablet 1  . Glucosamine-Chondroitin-MSM (TRIPLE FLEX PO) Take by  mouth.    Marland Kitchen ibuprofen (ADVIL,MOTRIN) 800 MG tablet Take 1 tablet (800 mg total) by mouth 3 (three) times daily. 21 tablet 0  . metFORMIN (GLUCOPHAGE) 500 MG tablet Take 1 tablet (500 mg total) by mouth 2 (two) times daily with a meal. 60 tablet 0  . sildenafil (VIAGRA) 100 MG tablet Take 0.5-1 tablets (50-100 mg total) by mouth daily as needed for erectile dysfunction. 5 tablet 11  . Turmeric 500 MG TABS Take by mouth.    . Vitamin D, Ergocalciferol, (DRISDOL) 1.25 MG (50000 UT) CAPS capsule Take 1 capsule (50,000 Units total) by mouth every 7 (seven) days. 4 capsule 0  . potassium chloride SA (K-DUR) 20 MEQ tablet Take 1 tablet (20 mEq total) by mouth daily. (Patient not taking: Reported on 04/10/2019) 30 tablet 0   No current facility-administered medications on file prior to visit.     Objective:  Objective  Physical Exam Vitals and nursing note reviewed.  Constitutional:      General: He is sleeping. He is not in acute distress.    Appearance: He is well-developed.  HENT:     Head: Normocephalic and atraumatic.  Eyes:     Pupils: Pupils are equal, round, and reactive to light.  Neck:     Thyroid: No thyromegaly.  Cardiovascular:     Rate and Rhythm: Normal rate and regular rhythm.     Heart sounds: Normal heart sounds. No murmur.  Pulmonary:     Effort: Pulmonary effort is normal. No respiratory distress.     Breath  sounds: Normal breath sounds. No wheezing or rales.  Chest:     Chest wall: No tenderness.  Musculoskeletal:        General: No tenderness.     Cervical back: Normal range of motion and neck supple.  Skin:    General: Skin is warm and dry.       Neurological:     Mental Status: He is oriented to person, place, and time.  Psychiatric:        Behavior: Behavior normal.        Thought Content: Thought content normal.        Judgment: Judgment normal.    BP (!) 142/100 (BP Location: Left Arm, Patient Position: Sitting, Cuff Size: Large)   Pulse 82   Temp (!)  97 F (36.1 C) (Temporal)   Resp 18   Ht 6\' 1"  (1.854 m)   Wt (!) 417 lb 3.2 oz (189.2 kg)   SpO2 96%   BMI 55.04 kg/m  Wt Readings from Last 3 Encounters:  04/10/19 (!) 417 lb 3.2 oz (189.2 kg)  09/17/18 (!) 406 lb 9.6 oz (184.4 kg)  08/21/18 (!) 398 lb (180.5 kg)     Lab Results  Component Value Date   WBC 9.8 03/14/2018   HGB 15.6 03/14/2018   HCT 45.2 03/14/2018   PLT 300.0 08/22/2017   GLUCOSE 84 09/17/2018   CHOL 194 09/17/2018   TRIG 145.0 09/17/2018   HDL 42.40 09/17/2018   LDLDIRECT 189.0 04/22/2015   LDLCALC 122 (H) 09/17/2018   ALT 27 09/17/2018   AST 20 09/17/2018   NA 141 09/17/2018   K 3.6 09/17/2018   CL 103 09/17/2018   CREATININE 1.05 09/17/2018   BUN 14 09/17/2018   CO2 31 09/17/2018   TSH 2.740 03/14/2018   HGBA1C 5.5 03/14/2018    DG Chest 2 View  Result Date: 08/07/2017 CLINICAL DATA:  Cough, fever EXAM: CHEST - 2 VIEW COMPARISON:  None. FINDINGS: Heart and mediastinal contours are within normal limits. No focal opacities or effusions. No acute bony abnormality. IMPRESSION: No active cardiopulmonary disease. Electronically Signed   By: 10/08/2017 M.D.   On: 08/07/2017 09:03     Assessment & Plan:  Plan  I have discontinued Linda D. Malkin's metoprolol succinate and amLODipine. I am also having him start on metoprolol succinate and metoprolol succinate. Additionally, I am having him maintain his ibuprofen, Turmeric, calcium-vitamin D, Glucosamine-Chondroitin-MSM (TRIPLE FLEX PO), sildenafil, chlorthalidone, metFORMIN, potassium chloride SA, Vitamin D (Ergocalciferol), and atorvastatin.  Meds ordered this encounter  Medications  . metoprolol succinate (TOPROL-XL) 50 MG 24 hr tablet    Sig: Take 1 tablet (50 mg total) by mouth daily. Take with or immediately following a meal.    Dispense:  90 tablet    Refill:  3  . metoprolol succinate (TOPROL-XL) 100 MG 24 hr tablet    Sig: Take 1 tablet (100 mg total) by mouth daily. Take with or immediately  following a meal.    Dispense:  90 tablet    Refill:  3    Cancel metoprolol 50 mg    Problem List Items Addressed This Visit      Unprioritized   Cutaneous skin tags    Skin tags around neck and back We will remove them when he comes in in 2-3 weeks       HTN (hypertension) - Primary    Poorly controlled will alter medications, encouraged DASH diet, minimize caffeine and obtain adequate sleep. Report  concerning symptoms and follow up as directed and as needed      Relevant Medications   metoprolol succinate (TOPROL-XL) 50 MG 24 hr tablet   metoprolol succinate (TOPROL-XL) 100 MG 24 hr tablet   Other Relevant Orders   Comprehensive metabolic panel   Hyperlipidemia associated with type 2 diabetes mellitus ([place])   Relevant Orders   Lipid panel   Comprehensive metabolic panel   Uncontrolled type 2 diabetes mellitus with hyperglycemia ([facility])   Relevant Orders   Hemoglobin A1c   Comprehensive metabolic panel      Follow-up: Return in about 2 weeks (around 04/24/2019), or if symptoms worsen or fail to improve---bp check and skin tag removal --- needs 40 min.  Ann Held, DO

## 2019-04-11 LAB — LIPID PANEL
Cholesterol: 159 mg/dL (ref 0–200)
HDL: 35.8 mg/dL — ABNORMAL LOW (ref >39.00)
LDL Cholesterol: 93 mg/dL (ref 0–99)
NonHDL: 123.62
Total CHOL/HDL Ratio: 4
Triglycerides: 155 mg/dL — ABNORMAL HIGH (ref 0.0–149.0)
VLDL: 31 mg/dL (ref 0.0–40.0)

## 2019-04-11 LAB — COMPREHENSIVE METABOLIC PANEL
ALT: 34 U/L (ref 0–53)
AST: 23 U/L (ref 0–37)
Albumin: 4.1 g/dL (ref 3.5–5.2)
Alkaline Phosphatase: 64 U/L (ref 39–117)
BUN: 16 mg/dL (ref 6–23)
CO2: 32 mEq/L (ref 19–32)
Calcium: 9.6 mg/dL (ref 8.4–10.5)
Chloride: 100 mEq/L (ref 96–112)
Creatinine, Ser: 0.88 mg/dL (ref 0.40–1.50)
GFR: 93.96 mL/min (ref >60.00)
Glucose, Bld: 86 mg/dL (ref 70–99)
Potassium: 3.2 mEq/L — ABNORMAL LOW (ref 3.5–5.1)
Sodium: 140 mEq/L (ref 135–145)
Total Bilirubin: 0.6 mg/dL (ref 0.2–1.2)
Total Protein: 7 g/dL (ref 6.0–8.3)

## 2019-04-11 LAB — HEMOGLOBIN A1C: Hgb A1c MFr Bld: 5.5 % (ref 4.6–6.5)

## 2019-04-16 ENCOUNTER — Other Ambulatory Visit: Payer: Self-pay

## 2019-04-16 DIAGNOSIS — E876 Hypokalemia: Secondary | ICD-10-CM

## 2019-04-16 MED ORDER — POTASSIUM CHLORIDE CRYS ER 20 MEQ PO TBCR: 20.0000 meq | EXTENDED_RELEASE_TABLET | Freq: Every day | ORAL | 1 refills | Status: DC

## 2019-04-23 ENCOUNTER — Other Ambulatory Visit: Payer: Self-pay

## 2019-04-24 ENCOUNTER — Encounter: Payer: Self-pay | Admitting: Family Medicine

## 2019-04-24 ENCOUNTER — Ambulatory Visit: Payer: 59 | Admitting: Family Medicine

## 2019-04-24 ENCOUNTER — Other Ambulatory Visit: Payer: Self-pay | Admitting: Family Medicine

## 2019-04-24 ENCOUNTER — Other Ambulatory Visit: Payer: Self-pay

## 2019-04-24 VITALS — BP 140/90 | HR 72 | Temp 97.1°F | Resp 18 | Ht 73.0 in | Wt >= 6400 oz

## 2019-04-24 DIAGNOSIS — L918 Other hypertrophic disorders of the skin: Secondary | ICD-10-CM

## 2019-04-24 NOTE — Patient Instructions (Addendum)
Keep area dry 24 hours then you may shower as usual      Skin Tag, Adult  A skin tag (acrochordon) is a soft, extra growth of skin. Most skin tags are flesh-colored and rarely bigger than a pencil eraser. They commonly form near areas where there are folds in the skin, such as the armpit or groin. Skin tags are not dangerous, and they do not spread from person to person (are not contagious). You may have one skin tag or several. Skin tags do not require treatment. However, your health care provider may recommend removal of a skin tag if it:  Gets irritated from clothing.  Bleeds.  Is visible and unsightly. Your health care provider can remove skin tags with a simple surgical procedure or a procedure that involves freezing the skin tag. Follow these instructions at home:  Watch for any changes in your skin tag. A normal skin tag does not require any other special care at home.  Take over-the-counter and prescription medicines only as told by your health care provider.  Keep all follow-up visits as told by your health care provider. This is important. Contact a health care provider if:  You have a skin tag that: ? Becomes painful. ? Changes color. ? Bleeds. ? Swells.  You develop more skin tags. This information is not intended to replace advice given to you by your health care provider. Make sure you discuss any questions you have with your health care provider. Document Revised: 01/05/2017 Document Reviewed: 02/07/2015 Elsevier Patient Education  2020 [employer].

## 2019-04-24 NOTE — Progress Notes (Deleted)
Patient ID: Sean [name], male    DOB: 1975/06/10  Age: 44 y.o. MRN: [id number]    Subjective:  Subjective  HPI Sean [name] presents for ***  Review of Systems  History Past Medical History:  Diagnosis Date  . Heartburn   . Hyperlipidemia   . Hypertension   . Sleep apnea   . SOB (shortness of breath) on exertion   . Swelling    feet and legs    He has no past surgical history on file.   His family history includes Breast cancer in his maternal grandmother; Depression in his mother; Diabetes in his mother; Heart disease (age of onset: 63) in his father; Hyperlipidemia in his father and mother; Hypertension in his father and mother; Obesity in his father and mother; Sudden death in his father.He reports that he has never smoked. He has never used smokeless tobacco. He reports current alcohol use. He reports that he does not use drugs.  Current Outpatient Medications on File Prior to Visit  Medication Sig Dispense Refill  . atorvastatin (LIPITOR) 40 MG tablet Take 1 tablet (40 mg total) by mouth daily. 90 tablet 3  . calcium-vitamin D 250-100 MG-UNIT tablet Take 1 tablet by mouth 2 (two) times daily.    . chlorthalidone (HYGROTON) 25 MG tablet Take 1 tablet (25 mg total) by mouth daily. 90 tablet 1  . Glucosamine-Chondroitin-MSM (TRIPLE FLEX PO) Take by mouth.    Marland Kitchen ibuprofen (ADVIL,MOTRIN) 800 MG tablet Take 1 tablet (800 mg total) by mouth 3 (three) times daily. 21 tablet 0  . metFORMIN (GLUCOPHAGE) 500 MG tablet Take 1 tablet (500 mg total) by mouth 2 (two) times daily with a meal. 60 tablet 0  . metoprolol succinate (TOPROL-XL) 100 MG 24 hr tablet Take 1 tablet (100 mg total) by mouth daily. Take with or immediately following a meal. 90 tablet 3  . metoprolol succinate (TOPROL-XL) 50 MG 24 hr tablet Take 1 tablet (50 mg total) by mouth daily. Take with or immediately following a meal. 90 tablet 3  . potassium chloride SA (KLOR-CON) 20 MEQ tablet Take 1 tablet (20 mEq total) by  mouth daily. 90 tablet 1  . sildenafil (VIAGRA) 100 MG tablet Take 0.5-1 tablets (50-100 mg total) by mouth daily as needed for erectile dysfunction. 5 tablet 11  . Turmeric 500 MG TABS Take by mouth.    . Vitamin D, Ergocalciferol, (DRISDOL) 1.25 MG (50000 UT) CAPS capsule Take 1 capsule (50,000 Units total) by mouth every 7 (seven) days. 4 capsule 0   No current facility-administered medications on file prior to visit.     Objective:  Objective  Physical Exam BP 140/90 (BP Location: Right Arm, Patient Position: Sitting, Cuff Size: Large)   Pulse 72   Temp (!) 97.1 F (36.2 C) (Temporal)   Resp 18   Ht 6\' 1"  (1.854 m)   Wt (!) 420 lb (190.5 kg)   SpO2 98%   BMI 55.41 kg/m  Wt Readings from Last 3 Encounters:  04/24/19 (!) 420 lb (190.5 kg)  04/10/19 (!) 417 lb 3.2 oz (189.2 kg)  09/17/18 (!) 406 lb 9.6 oz (184.4 kg)     Lab Results  Component Value Date   WBC 9.8 03/14/2018   HGB 15.6 03/14/2018   HCT 45.2 03/14/2018   PLT 300.0 08/22/2017   GLUCOSE 86 04/10/2019   CHOL 159 04/10/2019   TRIG 155.0 (H) 04/10/2019   HDL 35.80 (L) 04/10/2019   LDLDIRECT 189.0 04/22/2015  LDLCALC 93 04/10/2019   ALT 34 04/10/2019   AST 23 04/10/2019   NA 140 04/10/2019   K 3.2 (L) 04/10/2019   CL 100 04/10/2019   CREATININE 0.88 04/10/2019   BUN 16 04/10/2019   CO2 32 04/10/2019   TSH 2.740 03/14/2018   HGBA1C 5.5 04/10/2019    DG Chest 2 View  Result Date: 08/07/2017 CLINICAL DATA:  Cough, fever EXAM: CHEST - 2 VIEW COMPARISON:  None. FINDINGS: Heart and mediastinal contours are within normal limits. No focal opacities or effusions. No acute bony abnormality. IMPRESSION: No active cardiopulmonary disease. Electronically Signed   By: Charlett Nose M.D.   On: 08/07/2017 09:03     Assessment & Plan:  Plan  I am having Lemarcus D. Proctor maintain his ibuprofen, Turmeric, calcium-vitamin D, Glucosamine-Chondroitin-MSM (TRIPLE FLEX PO), sildenafil, chlorthalidone, metFORMIN, Vitamin D  (Ergocalciferol), atorvastatin, metoprolol succinate, metoprolol succinate, and potassium chloride SA.  No orders of the defined types were placed in this encounter.   Problem List Items Addressed This Visit    None      Follow-up: No follow-ups on file.  Donato Schultz, DO

## 2019-04-24 NOTE — Progress Notes (Signed)
Skin Tag Removal Procedure Note  Pre-operative Diagnosis: Classic skin tags (acrochordon)  Post-operative Diagnosis: Classic skin tags (acrochordon)  Locations:around neck and back   Indications: irritation     Anesthesia: Lidocaine 2% without epinephrine without added sodium bicarbonate  Procedure Details  The risks (including bleeding and infection) and benefits of the procedure and Written informed consent obtained. Using sterile iris scissors, multiple skin tags were snipped off at their bases after cleansing with Betadine.  Bleeding was controlled by pressure and silver nitrate   Findings: Pathognomonic benign lesions  not sent for pathological exam. 1 large skin on back sent to path  Condition: Stable  Complications: none.  Plan: 1. Instructed to keep the wounds dry and covered for 24-48h and clean thereafter. 2. Warning signs of infection were reviewed.   3. Recommended that the patient use OTC acetaminophen as needed for pain.  4. Return as needed.

## 2019-05-12 ENCOUNTER — Other Ambulatory Visit: Payer: Self-pay

## 2019-05-13 ENCOUNTER — Ambulatory Visit (HOSPITAL_BASED_OUTPATIENT_CLINIC_OR_DEPARTMENT_OTHER)
Admission: RE | Admit: 2019-05-13 | Discharge: 2019-05-13 | Disposition: A | Discharge status: Home / Self Care | Payer: 59 | Source: Ambulatory Visit | Attending: Family Medicine | Admitting: Family Medicine

## 2019-05-13 ENCOUNTER — Ambulatory Visit: Payer: 59 | Admitting: Family Medicine

## 2019-05-13 ENCOUNTER — Encounter: Payer: Self-pay | Admitting: Family Medicine

## 2019-05-13 ENCOUNTER — Other Ambulatory Visit: Payer: Self-pay

## 2019-05-13 VITALS — BP 120/88 | HR 77 | Temp 97.9°F | Resp 18 | Ht 73.0 in | Wt >= 6400 oz

## 2019-05-13 DIAGNOSIS — R29898 Other symptoms and signs involving the musculoskeletal system: Secondary | ICD-10-CM | POA: Diagnosis present

## 2019-05-13 DIAGNOSIS — M79651 Pain in right thigh: Secondary | ICD-10-CM

## 2019-05-13 DIAGNOSIS — M25551 Pain in right hip: Secondary | ICD-10-CM | POA: Diagnosis not present

## 2019-05-13 MED ORDER — MELOXICAM 15 MG PO TABS: ORAL_TABLET | ORAL | 0 refills | Status: DC

## 2019-05-13 NOTE — Progress Notes (Signed)
Patient ID: Sean [name], male    DOB: [birth year]  Age: 44 y.o. MRN: [id number]    Subjective:  Subjective  HPI [name] presents for R leg / groin pain x 1 week -- -no known injury No falls, no swelling   Pt has trouble lifting knee when pain gets bad.    He takes 800 mg IB at night with some relief  His R leg will give out at times No back pain No calf pain No urinary symptoms-- pain is in med thigh not in groin  Review of Systems  Constitutional: Negative for appetite change, diaphoresis, fatigue and unexpected weight change.  Eyes: Negative for pain, redness and visual disturbance.  Respiratory: Negative for cough, chest tightness, shortness of breath and wheezing.   Cardiovascular: Negative for chest pain, palpitations and leg swelling.  Endocrine: Negative for cold intolerance, heat intolerance, polydipsia, polyphagia and polyuria.  Genitourinary: Negative for difficulty urinating, dysuria and frequency.  Musculoskeletal: Positive for arthralgias and gait problem. Negative for neck pain and neck stiffness.  Neurological: Negative for dizziness, light-headedness, numbness and headaches.    History Past Medical History:  Diagnosis Date  . Heartburn   . Hyperlipidemia   . Hypertension   . Sleep apnea   . SOB (shortness of breath) on exertion   . Swelling    feet and legs    He has no past surgical history on file.   His family history includes Breast cancer in his maternal grandmother; Depression in his mother; Diabetes in his mother; Heart disease (age of onset: 71) in his father; Hyperlipidemia in his father and mother; Hypertension in his father and mother; Obesity in his father and mother; Sudden death in his father.He reports that he has never smoked. He has never used smokeless tobacco. He reports current alcohol use. He reports that he does not use drugs.  Current Outpatient Medications on File Prior to Visit  Medication Sig Dispense Refill  . atorvastatin  (LIPITOR) 40 MG tablet Take 1 tablet (40 mg total) by mouth daily. 90 tablet 3  . calcium-vitamin D 250-100 MG-UNIT tablet Take 1 tablet by mouth 2 (two) times daily.    . chlorthalidone (HYGROTON) 25 MG tablet Take 1 tablet (25 mg total) by mouth daily. 90 tablet 1  . Glucosamine-Chondroitin-MSM (TRIPLE FLEX PO) Take by mouth.    . metFORMIN (GLUCOPHAGE) 500 MG tablet Take 1 tablet (500 mg total) by mouth 2 (two) times daily with a meal. 60 tablet 0  . metoprolol succinate (TOPROL-XL) 100 MG 24 hr tablet Take 1 tablet (100 mg total) by mouth daily. Take with or immediately following a meal. 90 tablet 3  . metoprolol succinate (TOPROL-XL) 50 MG 24 hr tablet Take 1 tablet (50 mg total) by mouth daily. Take with or immediately following a meal. 90 tablet 3  . potassium chloride SA (KLOR-CON) 20 MEQ tablet Take 1 tablet (20 mEq total) by mouth daily. 90 tablet 1  . sildenafil (VIAGRA) 100 MG tablet Take 0.5-1 tablets (50-100 mg total) by mouth daily as needed for erectile dysfunction. 5 tablet 11  . Turmeric 500 MG TABS Take by mouth.    . Vitamin D, Ergocalciferol, (DRISDOL) 1.25 MG (50000 UT) CAPS capsule Take 1 capsule (50,000 Units total) by mouth every 7 (seven) days. 4 capsule 0   No current facility-administered medications on file prior to visit.     Objective:  Objective  Physical Exam Vitals and nursing note reviewed.  Musculoskeletal:  General: Tenderness present. No swelling, deformity or signs of injury.     Right lower leg: No edema.     Left lower leg: No edema.  Neurological:     General: No focal deficit present.     Mental Status: He is oriented to person, place, and time.     Cranial Nerves: No cranial nerve deficit.     Sensory: No sensory deficit.     Motor: Weakness present.     Coordination: Coordination normal.     Gait: Gait abnormal.     Deep Tendon Reflexes: Reflexes normal.     Comments: Pain with hip flexion R hip  Pain with palpation L thigh medially    No masses , no errythema  Not hot to touch    BP 120/88 (BP Location: Right Arm, Patient Position: Sitting, Cuff Size: Large)   Pulse 77   Temp 97.9 F (36.6 C) (Temporal)   Resp 18   Ht 6\' 1"  (1.854 m)   Wt (!) 419 lb 9.6 oz (190.3 kg)   SpO2 98%   BMI 55.36 kg/m  Wt Readings from Last 3 Encounters:  05/13/19 (!) 419 lb 9.6 oz (190.3 kg)  04/24/19 (!) 420 lb (190.5 kg)  04/10/19 (!) 417 lb 3.2 oz (189.2 kg)     Lab Results  Component Value Date   WBC 9.8 03/14/2018   HGB 15.6 03/14/2018   HCT 45.2 03/14/2018   PLT 300.0 08/22/2017   GLUCOSE 86 04/10/2019   CHOL 159 04/10/2019   TRIG 155.0 (H) 04/10/2019   HDL 35.80 (L) 04/10/2019   LDLDIRECT 189.0 04/22/2015   LDLCALC 93 04/10/2019   ALT 34 04/10/2019   AST 23 04/10/2019   NA 140 04/10/2019   K 3.2 (L) 04/10/2019   CL 100 04/10/2019   CREATININE 0.88 04/10/2019   BUN 16 04/10/2019   CO2 32 04/10/2019   TSH 2.740 03/14/2018   HGBA1C 5.5 04/10/2019    DG Chest 2 View  Result Date: 08/07/2017 CLINICAL DATA:  Cough, fever EXAM: CHEST - 2 VIEW COMPARISON:  None. FINDINGS: Heart and mediastinal contours are within normal limits. No focal opacities or effusions. No acute bony abnormality. IMPRESSION: No active cardiopulmonary disease. Electronically Signed   By: 10/08/2017 M.D.   On: 08/07/2017 09:03     Assessment & Plan:  Plan  I have discontinued Otniel D. Potash's ibuprofen. I am also having him start on meloxicam. Additionally, I am having him maintain his Turmeric, calcium-vitamin D, Glucosamine-Chondroitin-MSM (TRIPLE FLEX PO), sildenafil, chlorthalidone, metFORMIN, Vitamin D (Ergocalciferol), atorvastatin, metoprolol succinate, metoprolol succinate, and potassium chloride SA.  Meds ordered this encounter  Medications  . meloxicam (MOBIC) 15 MG tablet    Sig: 1/2-1 po qd prn pain    Dispense:  30 tablet    Refill:  0    Problem List Items Addressed This Visit      Unprioritized   Right thigh pain -  Primary    mobic for pain  Ice 15-20 min prn Check xray and 10/08/2017 Consider sport med prn       Relevant Medications   meloxicam (MOBIC) 15 MG tablet   Other Relevant Orders   US Venous Img Lower Unilateral Right (DVT)    Other Visit Diagnoses    Right hip pain       Relevant Medications   meloxicam (MOBIC) 15 MG tablet   Other Relevant Orders   DG Lumbar Spine Complete   DG HIP UNILAT WITH  PELVIS 1V RIGHT   Right leg weakness       Relevant Orders   DG Lumbar Spine Complete   DG HIP UNILAT WITH PELVIS 1V RIGHT      Follow-up: Return if symptoms worsen or fail to improve.  Ann Held, DO

## 2019-05-13 NOTE — Patient Instructions (Signed)
Go downstairs to radiology to have xrays and US of the R leg  mobic was sent to the pharmacy for pain  Use ice at the end of the day to help with pain and inflammation  Call or rto as needed

## 2019-05-13 NOTE — Assessment & Plan Note (Signed)
mobic for pain  Ice 15-20 min prn Check xray and [name] Consider sport med prn

## 2019-06-17 IMAGING — CR DG CHEST 2V
2 series · 2 of 2 positions shown · non-contrast
Comparison: None.

CLINICAL DATA: Cough, fever

EXAM:
CHEST - 2 VIEW

[w chest pa]
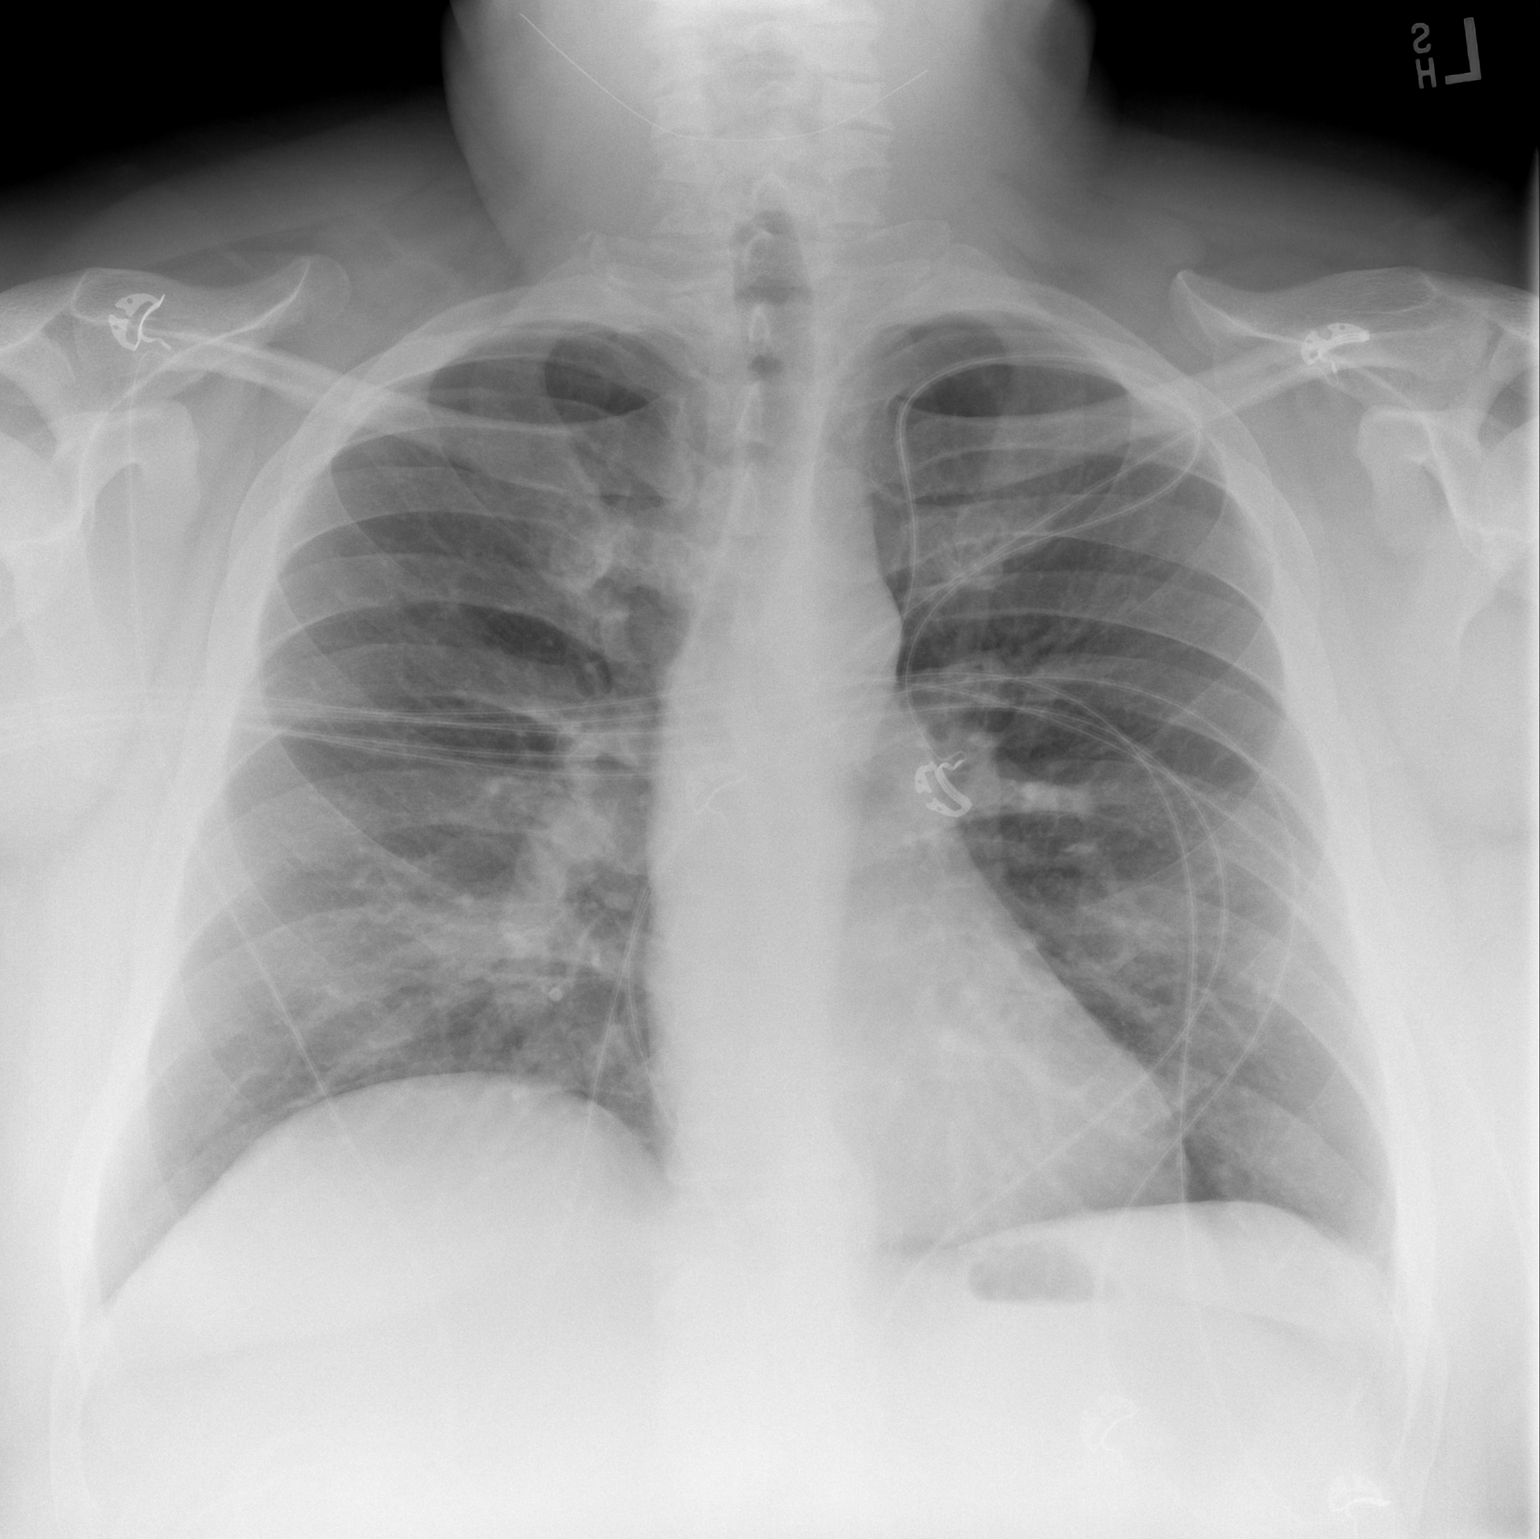

[w chest lat]
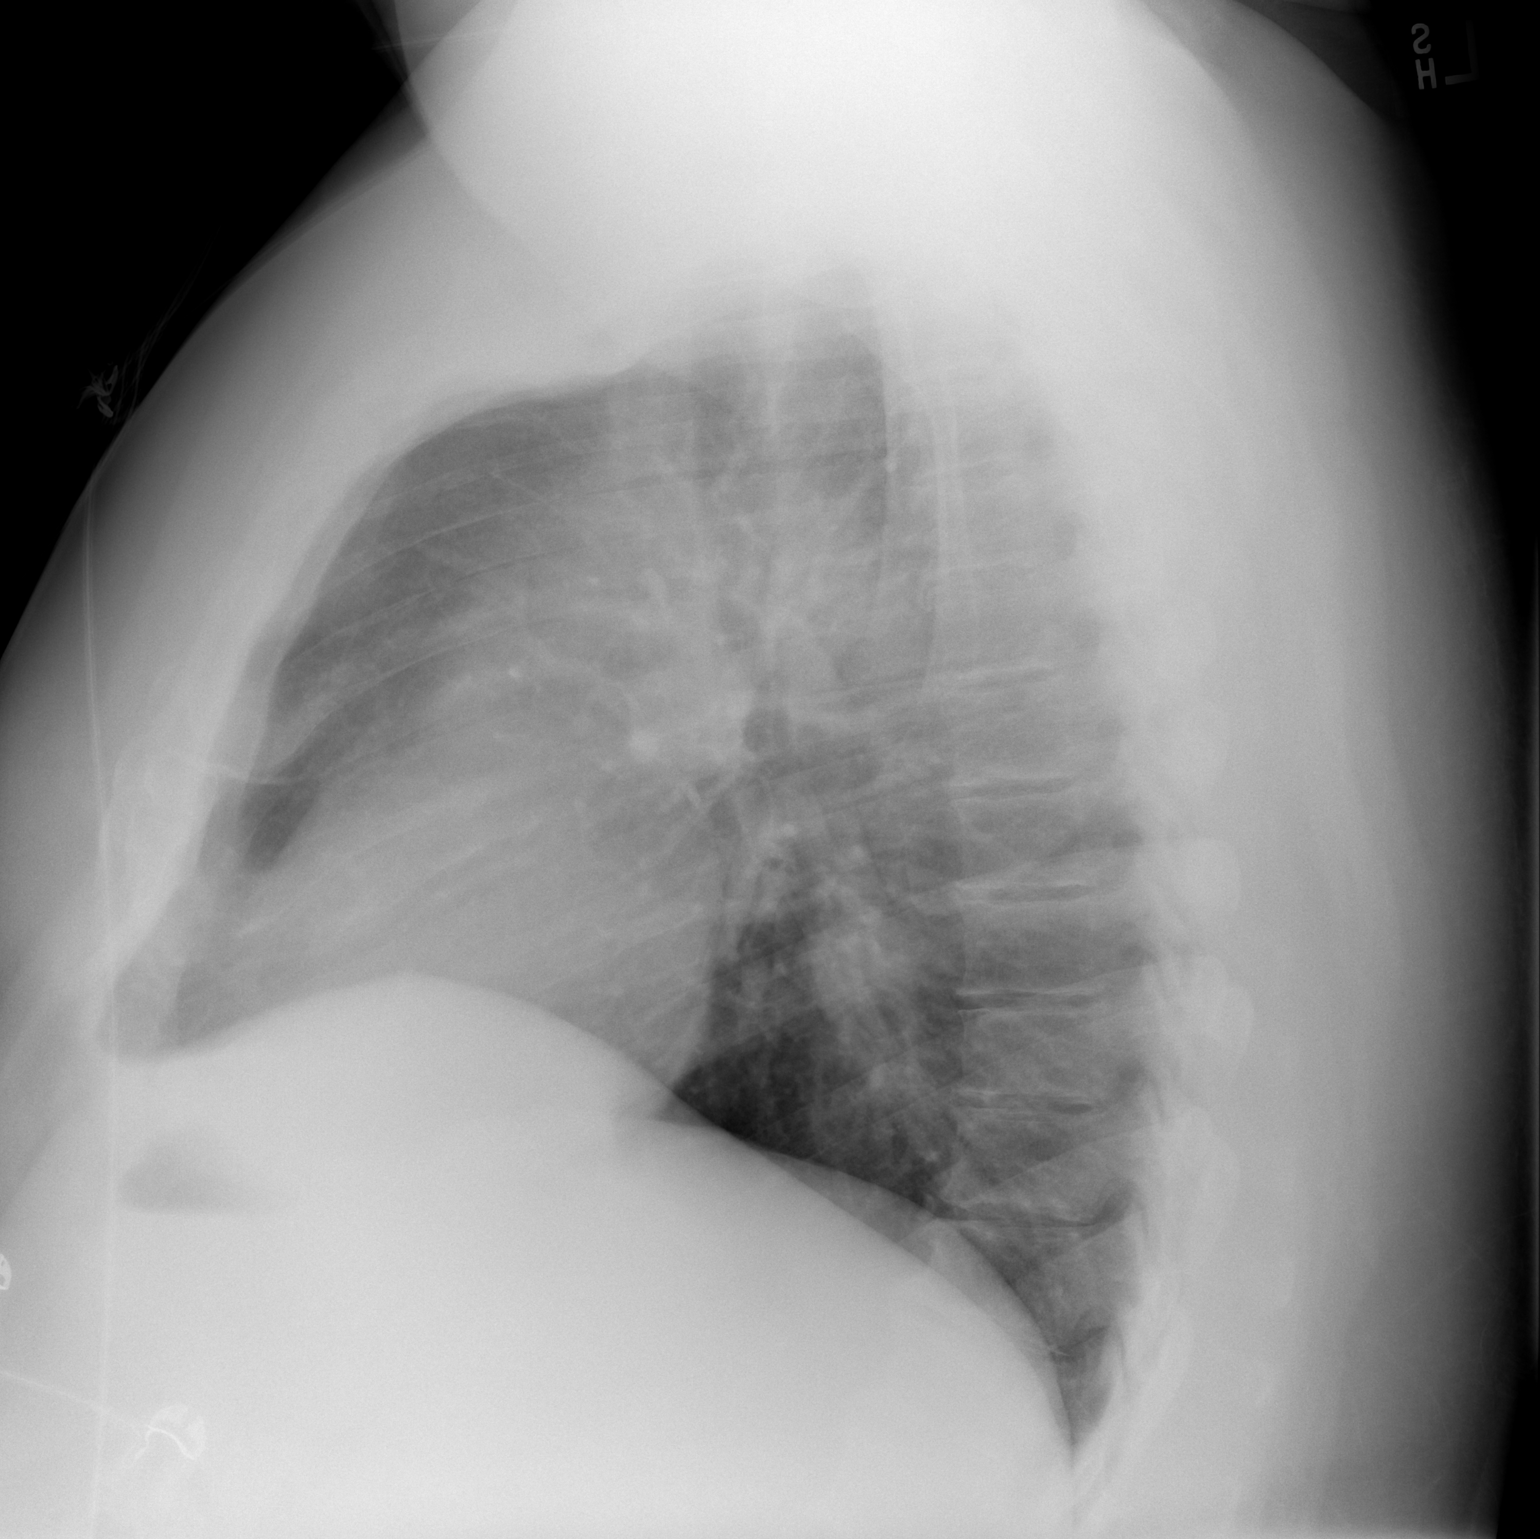

[2 of 2 positions shown; findings below may reference images not displayed]

FINDINGS: Heart and mediastinal contours are within normal limits. No focal
opacities or effusions. No acute bony abnormality.
IMPRESSION: No active cardiopulmonary disease.

## 2019-07-22 ENCOUNTER — Other Ambulatory Visit: Payer: Self-pay | Admitting: Family Medicine

## 2019-07-22 ENCOUNTER — Telehealth: Payer: Self-pay | Admitting: Family Medicine

## 2019-07-22 ENCOUNTER — Ambulatory Visit: Payer: 59 | Admitting: Family Medicine

## 2019-07-22 ENCOUNTER — Encounter: Payer: Self-pay | Admitting: Family Medicine

## 2019-07-22 ENCOUNTER — Other Ambulatory Visit: Payer: Self-pay

## 2019-07-22 VITALS — BP 140/90 | HR 84 | Temp 97.0°F | Resp 20 | Ht 73.0 in | Wt >= 6400 oz

## 2019-07-22 DIAGNOSIS — I10 Essential (primary) hypertension: Secondary | ICD-10-CM | POA: Diagnosis not present

## 2019-07-22 DIAGNOSIS — L03116 Cellulitis of left lower limb: Secondary | ICD-10-CM | POA: Diagnosis not present

## 2019-07-22 DIAGNOSIS — E876 Hypokalemia: Secondary | ICD-10-CM

## 2019-07-22 LAB — COMPREHENSIVE METABOLIC PANEL
ALT: 27 U/L (ref 0–53)
AST: 20 U/L (ref 0–37)
Albumin: 4.1 g/dL (ref 3.5–5.2)
Alkaline Phosphatase: 50 U/L (ref 39–117)
BUN: 13 mg/dL (ref 6–23)
CO2: 33 mEq/L — ABNORMAL HIGH (ref 19–32)
Calcium: 9.3 mg/dL (ref 8.4–10.5)
Chloride: 98 mEq/L (ref 96–112)
Creatinine, Ser: 0.9 mg/dL (ref 0.40–1.50)
GFR: 91.43 mL/min (ref >60.00)
Glucose, Bld: 90 mg/dL (ref 70–99)
Potassium: 3.1 mEq/L — ABNORMAL LOW (ref 3.5–5.1)
Sodium: 136 mEq/L (ref 135–145)
Total Bilirubin: 1.2 mg/dL (ref 0.2–1.2)
Total Protein: 6.8 g/dL (ref 6.0–8.3)

## 2019-07-22 LAB — CBC WITH DIFFERENTIAL/PLATELET
Basophils Absolute: 0.1 10*3/uL (ref 0.0–0.1)
Basophils Relative: 0.8 % (ref 0.0–3.0)
Eosinophils Absolute: 0.1 10*3/uL (ref 0.0–0.7)
Eosinophils Relative: 0.5 % (ref 0.0–5.0)
HCT: 42.6 % (ref 39.0–52.0)
Hemoglobin: 14.6 g/dL (ref 13.0–17.0)
Lymphocytes Relative: 14.5 % (ref 12.0–46.0)
Lymphs Abs: 1.7 10*3/uL (ref 0.7–4.0)
MCHC: 34.3 g/dL (ref 30.0–36.0)
MCV: 85.9 fl (ref 78.0–100.0)
Monocytes Absolute: 1.4 10*3/uL — ABNORMAL HIGH (ref 0.1–1.0)
Monocytes Relative: 12.1 % — ABNORMAL HIGH (ref 3.0–12.0)
Neutro Abs: 8.4 10*3/uL — ABNORMAL HIGH (ref 1.4–7.7)
Neutrophils Relative %: 72.1 % (ref 43.0–77.0)
Platelets: 212 10*3/uL (ref 150.0–400.0)
RBC: 4.95 Mil/uL (ref 4.22–5.81)
RDW: 14.2 % (ref 11.5–15.5)
WBC: 11.6 10*3/uL — ABNORMAL HIGH (ref 4.0–10.5)

## 2019-07-22 MED ORDER — METOPROLOL SUCCINATE ER 100 MG PO TB24: ORAL_TABLET | ORAL | 3 refills | Status: DC

## 2019-07-22 MED ORDER — CEPHALEXIN 500 MG PO CAPS: 500.0000 mg | ORAL_CAPSULE | Freq: Two times a day (BID) | ORAL | 0 refills | Status: DC

## 2019-07-22 MED ORDER — CHLORTHALIDONE 25 MG PO TABS: 25.0000 mg | ORAL_TABLET | Freq: Every day | ORAL | 1 refills | Status: DC

## 2019-07-22 NOTE — Telephone Encounter (Signed)
done

## 2019-07-22 NOTE — Assessment & Plan Note (Signed)
+  1 pitting edema LLE with errythem  abx per orders Elevate legs  F/u 2 weeks or sooner prn

## 2019-07-22 NOTE — Assessment & Plan Note (Signed)
Poorly controlled will alter medications, encouraged DASH diet, minimize caffeine and obtain adequate sleep. Report concerning symptoms and follow up as directed and as needed Inc metoprolol 100 mg  1 1/2 tab qd  F/u 2 weeks or sooner prn

## 2019-07-22 NOTE — Progress Notes (Signed)
Patient ID: Sean [name], male    DOB: 28-Aug-1975  Age: 44 y.o. MRN: [id number]    Subjective:  Subjective  HPI Sean [name] presents for f/u bp and c/o of swelling and redness in L low ext -- he noticed it yesterday and he had fever yesterday 101------ no fever since last night.  He had some sob yesterday but its gone now.  No cp.   No palpitations   Review of Systems  Constitutional: Negative.   HENT: Negative for congestion, ear pain, hearing loss, nosebleeds, postnasal drip, rhinorrhea, sinus pressure, sneezing and tinnitus.   Eyes: Negative for photophobia, discharge, itching and visual disturbance.  Respiratory: Negative.   Cardiovascular: Positive for leg swelling.  Gastrointestinal: Negative for abdominal distention, abdominal pain, anal bleeding, blood in stool and constipation.  Endocrine: Negative.   Genitourinary: Negative.   Musculoskeletal: Negative.   Skin: Positive for color change.  Allergic/Immunologic: Negative.   Neurological: Negative for dizziness, weakness, light-headedness, numbness and headaches.  Psychiatric/Behavioral: Negative for agitation, confusion, decreased concentration, dysphoric mood, sleep disturbance and suicidal ideas. The patient is not nervous/anxious.     History Past Medical History:  Diagnosis Date  . Heartburn   . Hyperlipidemia   . Hypertension   . Sleep apnea   . SOB (shortness of breath) on exertion   . Swelling    feet and legs    He has no past surgical history on file.   His family history includes Breast cancer in his maternal grandmother; Depression in his mother; Diabetes in his mother; Heart disease (age of onset: 61) in his father; Hyperlipidemia in his father and mother; Hypertension in his father and mother; Obesity in his father and mother; Sudden death in his father.He reports that he has never smoked. He has never used smokeless tobacco. He reports current alcohol use. He reports that he does not use  drugs.  Current Outpatient Medications on File Prior to Visit  Medication Sig Dispense Refill  . atorvastatin (LIPITOR) 40 MG tablet Take 1 tablet (40 mg total) by mouth daily. 90 tablet 3  . calcium-vitamin D 250-100 MG-UNIT tablet Take 1 tablet by mouth 2 (two) times daily.    . chlorthalidone (HYGROTON) 25 MG tablet Take 1 tablet (25 mg total) by mouth daily. 90 tablet 1  . Glucosamine-Chondroitin-MSM (TRIPLE FLEX PO) Take by mouth.    . meloxicam (MOBIC) 15 MG tablet 1/2-1 po qd prn pain 30 tablet 0  . metFORMIN (GLUCOPHAGE) 500 MG tablet Take 1 tablet (500 mg total) by mouth 2 (two) times daily with a meal. 60 tablet 0  . metoprolol succinate (TOPROL-XL) 50 MG 24 hr tablet Take 1 tablet (50 mg total) by mouth daily. Take with or immediately following a meal. 90 tablet 3  . potassium chloride SA (KLOR-CON) 20 MEQ tablet Take 1 tablet (20 mEq total) by mouth daily. 90 tablet 1  . sildenafil (VIAGRA) 100 MG tablet Take 0.5-1 tablets (50-100 mg total) by mouth daily as needed for erectile dysfunction. 5 tablet 11  . Turmeric 500 MG TABS Take by mouth.    . Vitamin D, Ergocalciferol, (DRISDOL) 1.25 MG (50000 UT) CAPS capsule Take 1 capsule (50,000 Units total) by mouth every 7 (seven) days. 4 capsule 0   No current facility-administered medications on file prior to visit.     Objective:  Objective  Physical Exam Vitals and nursing note reviewed.  Constitutional:      General: He is sleeping.     Appearance:  He is well-developed.  HENT:     Head: Normocephalic and atraumatic.  Eyes:     Pupils: Pupils are equal, round, and reactive to light.  Neck:     Thyroid: No thyromegaly.  Cardiovascular:     Rate and Rhythm: Normal rate and regular rhythm.     Heart sounds: No murmur heard.   Pulmonary:     Effort: Pulmonary effort is normal. No respiratory distress.     Breath sounds: Normal breath sounds. No wheezing or rales.  Chest:     Chest wall: No tenderness.  Musculoskeletal:         General: Swelling present. No tenderness.     Cervical back: Normal range of motion and neck supple.     Right lower leg: 1+ Pitting Edema present.     Left lower leg: 1+ Pitting Edema present.  Skin:    General: Skin is warm and dry.     Findings: Erythema present.       Neurological:     Mental Status: He is oriented to person, place, and time.  Psychiatric:        Behavior: Behavior normal.        Thought Content: Thought content normal.        Judgment: Judgment normal.    BP 140/90 (BP Location: Left Arm, Patient Position: Sitting, Cuff Size: Large)   Pulse 84   Temp (!) 97 F (36.1 C) (Temporal)   Resp 20   Ht 6\' 1"  (1.854 m)   Wt (!) 422 lb (191.4 kg)   SpO2 98%   BMI 55.68 kg/m  Wt Readings from Last 3 Encounters:  07/22/19 (!) 422 lb (191.4 kg)  05/13/19 (!) 419 lb 9.6 oz (190.3 kg)  04/24/19 (!) 420 lb (190.5 kg)     Lab Results  Component Value Date   WBC 9.8 03/14/2018   HGB 15.6 03/14/2018   HCT 45.2 03/14/2018   PLT 300.0 08/22/2017   GLUCOSE 86 04/10/2019   CHOL 159 04/10/2019   TRIG 155.0 (H) 04/10/2019   HDL 35.80 (L) 04/10/2019   LDLDIRECT 189.0 04/22/2015   LDLCALC 93 04/10/2019   ALT 34 04/10/2019   AST 23 04/10/2019   NA 140 04/10/2019   K 3.2 (L) 04/10/2019   CL 100 04/10/2019   CREATININE 0.88 04/10/2019   BUN 16 04/10/2019   CO2 32 04/10/2019   TSH 2.740 03/14/2018   HGBA1C 5.5 04/10/2019    DG Lumbar Spine Complete  Result Date: 05/13/2019 CLINICAL DATA:  Right groin/leg pain EXAM: LUMBAR SPINE - COMPLETE 4+ VIEW COMPARISON:  None. FINDINGS: Mild degenerative facet disease throughout the lumbar spine, most pronounced at the lower lumbar spine. Disc spaces maintained. Normal alignment. Mild anterior spurring in the lower thoracic and upper lumbar spine. No fracture. SI joints symmetric and unremarkable. IMPRESSION: Early degenerative disc and mild degenerative facet disease as above. No acute bony abnormality. Electronically  Signed   By: 07/13/2019 M.D.   On: 05/13/2019 20:19   07/13/2019 Venous Img Lower Unilateral Right (DVT)  Result Date: 05/13/2019 CLINICAL DATA:  Right thigh region pain EXAM: RIGHT LOWER EXTREMITY VENOUS DUPLEX ULTRASOUND TECHNIQUE: Gray-scale sonography with graded compression, as well as color Doppler and duplex ultrasound were performed to evaluate the right lower extremity deep venous system from the level of the common femoral vein and including the common femoral, femoral, profunda femoral, popliteal and calf veins including the posterior tibial, peroneal and gastrocnemius veins when visible. The superficial  great saphenous vein was also interrogated. Spectral Doppler was utilized to evaluate flow at rest and with distal augmentation maneuvers in the common femoral, femoral and popliteal veins. COMPARISON:  None. FINDINGS: Contralateral Common Femoral Vein: Respiratory phasicity is normal and symmetric with the symptomatic side. No evidence of thrombus. Normal compressibility. Common Femoral Vein: No evidence of thrombus. Normal compressibility, respiratory phasicity and response to augmentation. Saphenofemoral Junction: No evidence of thrombus. Normal compressibility and flow on color Doppler imaging. Profunda Femoral Vein: No evidence of thrombus. Normal compressibility and flow on color Doppler imaging. Femoral Vein: No evidence of thrombus. Normal compressibility, respiratory phasicity and response to augmentation. Popliteal Vein: No evidence of thrombus. Normal compressibility, respiratory phasicity and response to augmentation. Calf Veins: No evidence of thrombus. Normal compressibility and flow on color Doppler imaging. Superficial Great Saphenous Vein: No evidence of thrombus. Normal compressibility. Venous Reflux:  None. Other Findings:  None. IMPRESSION: No evidence of deep venous thrombosis in the right lower extremity. Left common femoral vein also patent. Electronically Signed   By: Lowella Grip III M.D.   On: 05/13/2019 12:24   DG HIP UNILAT WITH PELVIS 1V RIGHT  Result Date: 05/13/2019 CLINICAL DATA:  Right hip pain EXAM: DG HIP (WITH OR WITHOUT PELVIS) 1V RIGHT COMPARISON:  None. FINDINGS: There is no evidence of hip fracture or dislocation. There is no evidence of arthropathy or other focal bone abnormality. Hip joints and SI joints are symmetric and unremarkable. IMPRESSION: Negative. Electronically Signed   By: Rolm Baptise M.D.   On: 05/13/2019 20:20     Assessment & Plan:  Plan  I have changed Sean [name]'s metoprolol succinate. I am also having him start on cephALEXin. Additionally, I am having him maintain his Turmeric, calcium-vitamin D, Glucosamine-Chondroitin-MSM (TRIPLE FLEX PO), sildenafil, chlorthalidone, metFORMIN, Vitamin D (Ergocalciferol), atorvastatin, metoprolol succinate, potassium chloride SA, and meloxicam.  Meds ordered this encounter  Medications  . metoprolol succinate (TOPROL-XL) 100 MG 24 hr tablet    Sig: 1 1/2 po qd    Dispense:  135 tablet    Refill:  3    Cancel metoprolol 50 mg  . cephALEXin (KEFLEX) 500 MG capsule    Sig: Take 1 capsule (500 mg total) by mouth 2 (two) times daily.    Dispense:  20 capsule    Refill:  0    Problem List Items Addressed This Visit      Unprioritized   Cellulitis of left lower extremity - Primary    +1 pitting edema LLE with errythem  abx per orders Elevate legs  F/u 2 weeks or sooner prn       Relevant Medications   cephALEXin (KEFLEX) 500 MG capsule   Other Relevant Orders   CBC with Differential/Platelet   Comprehensive metabolic panel   HTN (hypertension)    Poorly controlled will alter medications, encouraged DASH diet, minimize caffeine and obtain adequate sleep. Report concerning symptoms and follow up as directed and as needed Inc metoprolol 100 mg  1 1/2 tab qd  F/u 2 weeks or sooner prn       Relevant Medications   metoprolol succinate (TOPROL-XL) 100 MG 24 hr tablet       Follow-up: Return in about 2 weeks (around 08/05/2019) for hypertension, cellulitis.  Ann Held, DO

## 2019-07-22 NOTE — Patient Instructions (Signed)

## 2019-07-22 NOTE — Telephone Encounter (Signed)
Medication: chlorthalidone (HYGROTON) 25 MG tablet [[id number]]   Has the patient contacted their pharmacy? No. (If no, request that the patient contact the pharmacy for the refill.) (If yes, when and what did the pharmacy advise?)  Preferred Pharmacy (with phone number or street name):  [id number] - [place], [place] - [address]  [address], [place] [address]  Phone:  [phone] Fax:  [phone]  DEA #:  -- Agent: Please be advised that RX refills may take up to 3 business days. We ask that you follow-up with your pharmacy.

## 2019-07-22 NOTE — Telephone Encounter (Signed)
Med hasn't been filled since 09/17/18. Was this discussed at today's visit? Please advise

## 2019-07-27 ENCOUNTER — Other Ambulatory Visit: Payer: Self-pay | Admitting: Family Medicine

## 2019-07-27 DIAGNOSIS — I10 Essential (primary) hypertension: Secondary | ICD-10-CM

## 2019-07-29 ENCOUNTER — Ambulatory Visit: Payer: 59 | Admitting: Family Medicine

## 2019-07-29 DIAGNOSIS — Z0289 Encounter for other administrative examinations: Secondary | ICD-10-CM

## 2019-08-04 ENCOUNTER — Telehealth: Payer: Self-pay | Admitting: Family Medicine

## 2019-08-04 NOTE — Telephone Encounter (Signed)
Medication: amLODipine (NORVASC) 5 MG tablet      Has the patient contacted their pharmacy? yes (If no, request that the patient contact the pharmacy for the refill.) (If yes, when and what did the pharmacy advise?) contact doctor office     Preferred Pharmacy (with phone number or street name): [id number] - [place], [place] - [address]  [address], [place] [address]  Phone:  [phone] Fax:  [phone]      Agent: Please be advised that RX refills may take up to 3 business days. We ask that you follow-up with your pharmacy.

## 2019-08-04 NOTE — Telephone Encounter (Signed)
I believe you took him off this medication and switched to the  two Metoprolol doses? Please confirm

## 2019-08-04 NOTE — Telephone Encounter (Signed)
If he has been taking it -- please add it to med list but we did have on our list it had been stopped Please check with pt

## 2019-08-05 ENCOUNTER — Ambulatory Visit: Payer: 59 | Admitting: Family Medicine

## 2019-08-05 ENCOUNTER — Encounter: Payer: Self-pay | Admitting: Family Medicine

## 2019-08-05 ENCOUNTER — Other Ambulatory Visit: Payer: Self-pay

## 2019-08-05 VITALS — BP 120/82 | HR 65 | Temp 97.3°F | Resp 18 | Ht 73.0 in | Wt >= 6400 oz

## 2019-08-05 DIAGNOSIS — I10 Essential (primary) hypertension: Secondary | ICD-10-CM

## 2019-08-05 DIAGNOSIS — E1169 Type 2 diabetes mellitus with other specified complication: Secondary | ICD-10-CM

## 2019-08-05 DIAGNOSIS — R600 Localized edema: Secondary | ICD-10-CM | POA: Diagnosis not present

## 2019-08-05 DIAGNOSIS — E876 Hypokalemia: Secondary | ICD-10-CM

## 2019-08-05 DIAGNOSIS — R7303 Prediabetes: Secondary | ICD-10-CM

## 2019-08-05 DIAGNOSIS — E8881 Metabolic syndrome: Secondary | ICD-10-CM | POA: Diagnosis not present

## 2019-08-05 DIAGNOSIS — Z6841 Body Mass Index (BMI) 40.0 and over, adult: Secondary | ICD-10-CM

## 2019-08-05 DIAGNOSIS — L03116 Cellulitis of left lower limb: Secondary | ICD-10-CM

## 2019-08-05 DIAGNOSIS — E785 Hyperlipidemia, unspecified: Secondary | ICD-10-CM | POA: Diagnosis not present

## 2019-08-05 MED ORDER — POTASSIUM CHLORIDE CRYS ER 20 MEQ PO TBCR: 20.0000 meq | EXTENDED_RELEASE_TABLET | Freq: Every day | ORAL | 1 refills | Status: DC

## 2019-08-05 MED ORDER — NONFORMULARY OR COMPOUNDED ITEM: 0 refills | Status: AC

## 2019-08-05 MED ORDER — METOPROLOL SUCCINATE ER 50 MG PO TB24: 50.0000 mg | ORAL_TABLET | Freq: Every day | ORAL | 3 refills | Status: DC

## 2019-08-05 MED ORDER — METFORMIN HCL 500 MG PO TABS: 500.0000 mg | ORAL_TABLET | Freq: Two times a day (BID) | ORAL | 1 refills | Status: DC

## 2019-08-05 NOTE — Assessment & Plan Note (Signed)
D/w pt diet and exercise  

## 2019-08-05 NOTE — Assessment & Plan Note (Signed)
Encouraged heart healthy diet, increase exercise, avoid trans fats, consider a krill oil cap daily 

## 2019-08-05 NOTE — Patient Instructions (Signed)
DASH Eating Plan DASH stands for "Dietary Approaches to Stop Hypertension." The DASH eating plan is a healthy eating plan that has been shown to reduce high blood pressure (hypertension). It may also reduce your risk for type 2 diabetes, heart disease, and stroke. The DASH eating plan may also help with weight loss. What are tips for following this plan?  General guidelines  Avoid eating more than 2,300 mg (milligrams) of salt (sodium) a day. If you have hypertension, you may need to reduce your sodium intake to 1,500 mg a day.  Limit alcohol intake to no more than 1 drink a day for nonpregnant women and 2 drinks a day for men. One drink equals 12 oz of beer, 5 oz of wine, or 1 oz of hard liquor.  Work with your health care provider to maintain a healthy body weight or to lose weight. Ask what an ideal weight is for you.  Get at least 30 minutes of exercise that causes your heart to beat faster (aerobic exercise) most days of the week. Activities may include walking, swimming, or biking.  Work with your health care provider or diet and nutrition specialist (dietitian) to adjust your eating plan to your individual calorie needs. Reading food labels   Check food labels for the amount of sodium per serving. Choose foods with less than 5 percent of the Daily Value of sodium. Generally, foods with less than 300 mg of sodium per serving fit into this eating plan.  To find whole grains, look for the word "whole" as the first word in the ingredient list. Shopping  Buy products labeled as "low-sodium" or "no salt added."  Buy fresh foods. Avoid canned foods and premade or frozen meals. Cooking  Avoid adding salt when cooking. Use salt-free seasonings or herbs instead of table salt or sea salt. Check with your health care provider or pharmacist before using salt substitutes.  Do not fry foods. Cook foods using healthy methods such as baking, boiling, grilling, and broiling instead.  Cook with  heart-healthy oils, such as olive, canola, soybean, or sunflower oil. Meal planning  Eat a balanced diet that includes: ? 5 or more servings of fruits and vegetables each day. At each meal, try to fill half of your plate with fruits and vegetables. ? Up to 6-8 servings of whole grains each day. ? Less than 6 oz of lean meat, poultry, or fish each day. A 3-oz serving of meat is about the same size as a deck of cards. One egg equals 1 oz. ? 2 servings of low-fat dairy each day. ? A serving of nuts, seeds, or beans 5 times each week. ? Heart-healthy fats. Healthy fats called Omega-3 fatty acids are found in foods such as flaxseeds and coldwater fish, like sardines, salmon, and mackerel.  Limit how much you eat of the following: ? Canned or prepackaged foods. ? Food that is high in trans fat, such as fried foods. ? Food that is high in saturated fat, such as fatty meat. ? Sweets, desserts, sugary drinks, and other foods with added sugar. ? Full-fat dairy products.  Do not salt foods before eating.  Try to eat at least 2 vegetarian meals each week.  Eat more home-cooked food and less restaurant, buffet, and fast food.  When eating at a restaurant, ask that your food be prepared with less salt or no salt, if possible. What foods are recommended? The items listed may not be a complete list. Talk with your dietitian about   what dietary choices are best for you. Grains Whole-grain or whole-wheat bread. Whole-grain or whole-wheat pasta. Brown rice. Oatmeal. Quinoa. Bulgur. Whole-grain and low-sodium cereals. Pita bread. Low-fat, low-sodium crackers. Whole-wheat flour tortillas. Vegetables Fresh or frozen vegetables (raw, steamed, roasted, or grilled). Low-sodium or reduced-sodium tomato and vegetable juice. Low-sodium or reduced-sodium tomato sauce and tomato paste. Low-sodium or reduced-sodium canned vegetables. Fruits All fresh, dried, or frozen fruit. Canned fruit in natural juice (without  added sugar). Meat and other protein foods Skinless chicken or turkey. Ground chicken or turkey. Pork with fat trimmed off. Fish and seafood. Egg whites. Dried beans, peas, or lentils. Unsalted nuts, nut butters, and seeds. Unsalted canned beans. Lean cuts of beef with fat trimmed off. Low-sodium, lean deli meat. Dairy Low-fat (1%) or fat-free (skim) milk. Fat-free, low-fat, or reduced-fat cheeses. Nonfat, low-sodium ricotta or cottage cheese. Low-fat or nonfat yogurt. Low-fat, low-sodium cheese. Fats and oils Soft margarine without trans fats. Vegetable oil. Low-fat, reduced-fat, or light mayonnaise and salad dressings (reduced-sodium). Canola, safflower, olive, soybean, and sunflower oils. Avocado. Seasoning and other foods Herbs. Spices. Seasoning mixes without salt. Unsalted popcorn and pretzels. Fat-free sweets. What foods are not recommended? The items listed may not be a complete list. Talk with your dietitian about what dietary choices are best for you. Grains Baked goods made with fat, such as croissants, muffins, or some breads. Dry pasta or rice meal packs. Vegetables Creamed or fried vegetables. Vegetables in a cheese sauce. Regular canned vegetables (not low-sodium or reduced-sodium). Regular canned tomato sauce and paste (not low-sodium or reduced-sodium). Regular tomato and vegetable juice (not low-sodium or reduced-sodium). Pickles. Olives. Fruits Canned fruit in a light or heavy syrup. Fried fruit. Fruit in cream or butter sauce. Meat and other protein foods Fatty cuts of meat. Ribs. Fried meat. Bacon. Sausage. Bologna and other processed lunch meats. Salami. Fatback. Hotdogs. Bratwurst. Salted nuts and seeds. Canned beans with added salt. Canned or smoked fish. Whole eggs or egg yolks. Chicken or turkey with skin. Dairy Whole or 2% milk, cream, and half-and-half. Whole or full-fat cream cheese. Whole-fat or sweetened yogurt. Full-fat cheese. Nondairy creamers. Whipped toppings.  Processed cheese and cheese spreads. Fats and oils Butter. Stick margarine. Lard. Shortening. Ghee. Bacon fat. Tropical oils, such as coconut, palm kernel, or palm oil. Seasoning and other foods Salted popcorn and pretzels. Onion salt, garlic salt, seasoned salt, table salt, and sea salt. Worcestershire sauce. Tartar sauce. Barbecue sauce. Teriyaki sauce. Soy sauce, including reduced-sodium. Steak sauce. Canned and packaged gravies. Fish sauce. Oyster sauce. Cocktail sauce. Horseradish that you find on the shelf. Ketchup. Mustard. Meat flavorings and tenderizers. Bouillon cubes. Hot sauce and Tabasco sauce. Premade or packaged marinades. Premade or packaged taco seasonings. Relishes. Regular salad dressings. Where to find more information:  National Heart, Lung, and Blood Institute: www.nhlbi.nih.gov  American Heart Association: www.heart.org Summary  The DASH eating plan is a healthy eating plan that has been shown to reduce high blood pressure (hypertension). It may also reduce your risk for type 2 diabetes, heart disease, and stroke.  With the DASH eating plan, you should limit salt (sodium) intake to 2,300 mg a day. If you have hypertension, you may need to reduce your sodium intake to 1,500 mg a day.  When on the DASH eating plan, aim to eat more fresh fruits and vegetables, whole grains, lean proteins, low-fat dairy, and heart-healthy fats.  Work with your health care provider or diet and nutrition specialist (dietitian) to adjust your eating plan to your   individual calorie needs. This information is not intended to replace advice given to you by your health care provider. Make sure you discuss any questions you have with your health care provider. Document Revised: 01/05/2017 Document Reviewed: 01/17/2016 Elsevier Patient Education  2020 [employer] Inc.  

## 2019-08-05 NOTE — Assessment & Plan Note (Signed)
con't diuretics Compression socks

## 2019-08-05 NOTE — Assessment & Plan Note (Signed)
Check labs  con't diet 

## 2019-08-05 NOTE — Assessment & Plan Note (Signed)
Well controlled, no changes to meds. Encouraged heart healthy diet such as the DASH diet and exercise as tolerated.  °

## 2019-08-05 NOTE — Progress Notes (Signed)
Patient ID: Sean [name], male    DOB: 10/28/1975  Age: 44 y.o. MRN: [id number]    Subjective:  Subjective  HPI Sean [name] presents for f/u bp and cellulitis   No new complaints   He also needs labs   Review of Systems  Constitutional: Negative for appetite change, diaphoresis, fatigue and unexpected weight change.  Eyes: Negative for pain, redness and visual disturbance.  Respiratory: Negative for cough, chest tightness, shortness of breath and wheezing.   Cardiovascular: Negative for chest pain, palpitations and leg swelling.  Endocrine: Negative for cold intolerance, heat intolerance, polydipsia, polyphagia and polyuria.  Genitourinary: Negative for difficulty urinating, dysuria and frequency.  Neurological: Negative for dizziness, light-headedness, numbness and headaches.    History Past Medical History:  Diagnosis Date  . Heartburn   . Hyperlipidemia   . Hypertension   . Sleep apnea   . SOB (shortness of breath) on exertion   . Swelling    feet and legs    He has no past surgical history on file.   His family history includes Breast cancer in his maternal grandmother; Depression in his mother; Diabetes in his mother; Heart disease (age of onset: 71) in his father; Hyperlipidemia in his father and mother; Hypertension in his father and mother; Obesity in his father and mother; Sudden death in his father.He reports that he has never smoked. He has never used smokeless tobacco. He reports current alcohol use. He reports that he does not use drugs.  Current Outpatient Medications on File Prior to Visit  Medication Sig Dispense Refill  . atorvastatin (LIPITOR) 40 MG tablet Take 1 tablet (40 mg total) by mouth daily. 90 tablet 3  . calcium-vitamin D 250-100 MG-UNIT tablet Take 1 tablet by mouth 2 (two) times daily.    . cephALEXin (KEFLEX) 500 MG capsule Take 1 capsule (500 mg total) by mouth 2 (two) times daily. 20 capsule 0  . chlorthalidone (HYGROTON) 25 MG tablet Take 1  tablet (25 mg total) by mouth daily. 90 tablet 1  . Glucosamine-Chondroitin-MSM (TRIPLE FLEX PO) Take by mouth.    . meloxicam (MOBIC) 15 MG tablet 1/2-1 po qd prn pain 30 tablet 0  . metoprolol succinate (TOPROL-XL) 100 MG 24 hr tablet 1 1/2 po qd 135 tablet 3  . sildenafil (VIAGRA) 100 MG tablet Take 0.5-1 tablets (50-100 mg total) by mouth daily as needed for erectile dysfunction. 5 tablet 11  . Turmeric 500 MG TABS Take by mouth.    . Vitamin D, Ergocalciferol, (DRISDOL) 1.25 MG (50000 UT) CAPS capsule Take 1 capsule (50,000 Units total) by mouth every 7 (seven) days. 4 capsule 0   No current facility-administered medications on file prior to visit.     Objective:  Objective  Physical Exam Vitals and nursing note reviewed.  Constitutional:      General: He is sleeping.     Appearance: He is well-developed.  HENT:     Head: Normocephalic and atraumatic.  Eyes:     Pupils: Pupils are equal, round, and reactive to light.  Neck:     Thyroid: No thyromegaly.  Cardiovascular:     Rate and Rhythm: Normal rate and regular rhythm.     Heart sounds: No murmur heard.   Pulmonary:     Effort: Pulmonary effort is normal. No respiratory distress.     Breath sounds: Normal breath sounds. No wheezing or rales.  Chest:     Chest wall: No tenderness.  Musculoskeletal:  General: No tenderness.     Cervical back: Normal range of motion and neck supple.  Skin:    General: Skin is warm and dry.  Neurological:     Mental Status: He is oriented to person, place, and time.  Psychiatric:        Behavior: Behavior normal.        Thought Content: Thought content normal.        Judgment: Judgment normal.    BP 120/82 (BP Location: Right Arm, Patient Position: Sitting, Cuff Size: Large)   Pulse 65   Temp (!) 97.3 F (36.3 C) (Temporal)   Resp 18   Ht 6\' 1"  (1.854 m)   Wt (!) 425 lb 9.6 oz (193.1 kg)   SpO2 98%   BMI 56.15 kg/m  Wt Readings from Last 3 Encounters:  08/05/19 (!)  425 lb 9.6 oz (193.1 kg)  07/22/19 (!) 422 lb (191.4 kg)  05/13/19 (!) 419 lb 9.6 oz (190.3 kg)     Lab Results  Component Value Date   WBC 11.6 (H) 07/22/2019   HGB 14.6 07/22/2019   HCT 42.6 07/22/2019   PLT 212.0 07/22/2019   GLUCOSE 90 07/22/2019   CHOL 159 04/10/2019   TRIG 155.0 (H) 04/10/2019   HDL 35.80 (L) 04/10/2019   LDLDIRECT 189.0 04/22/2015   LDLCALC 93 04/10/2019   ALT 27 07/22/2019   AST 20 07/22/2019   NA 136 07/22/2019   K 3.1 (L) 07/22/2019   CL 98 07/22/2019   CREATININE 0.90 07/22/2019   BUN 13 07/22/2019   CO2 33 (H) 07/22/2019   TSH 2.740 03/14/2018   HGBA1C 5.5 04/10/2019    DG Lumbar Spine Complete  Result Date: 05/13/2019 CLINICAL DATA:  Right groin/leg pain EXAM: LUMBAR SPINE - COMPLETE 4+ VIEW COMPARISON:  None. FINDINGS: Mild degenerative facet disease throughout the lumbar spine, most pronounced at the lower lumbar spine. Disc spaces maintained. Normal alignment. Mild anterior spurring in the lower thoracic and upper lumbar spine. No fracture. SI joints symmetric and unremarkable. IMPRESSION: Early degenerative disc and mild degenerative facet disease as above. No acute bony abnormality. Electronically Signed   By: 07/13/2019 M.D.   On: 05/13/2019 20:19   07/13/2019 Venous Img Lower Unilateral Right (DVT)  Result Date: 05/13/2019 CLINICAL DATA:  Right thigh region pain EXAM: RIGHT LOWER EXTREMITY VENOUS DUPLEX ULTRASOUND TECHNIQUE: Gray-scale sonography with graded compression, as well as color Doppler and duplex ultrasound were performed to evaluate the right lower extremity deep venous system from the level of the common femoral vein and including the common femoral, femoral, profunda femoral, popliteal and calf veins including the posterior tibial, peroneal and gastrocnemius veins when visible. The superficial great saphenous vein was also interrogated. Spectral Doppler was utilized to evaluate flow at rest and with distal augmentation maneuvers in the  common femoral, femoral and popliteal veins. COMPARISON:  None. FINDINGS: Contralateral Common Femoral Vein: Respiratory phasicity is normal and symmetric with the symptomatic side. No evidence of thrombus. Normal compressibility. Common Femoral Vein: No evidence of thrombus. Normal compressibility, respiratory phasicity and response to augmentation. Saphenofemoral Junction: No evidence of thrombus. Normal compressibility and flow on color Doppler imaging. Profunda Femoral Vein: No evidence of thrombus. Normal compressibility and flow on color Doppler imaging. Femoral Vein: No evidence of thrombus. Normal compressibility, respiratory phasicity and response to augmentation. Popliteal Vein: No evidence of thrombus. Normal compressibility, respiratory phasicity and response to augmentation. Calf Veins: No evidence of thrombus. Normal compressibility and flow on color Doppler  imaging. Superficial Great Saphenous Vein: No evidence of thrombus. Normal compressibility. Venous Reflux:  None. Other Findings:  None. IMPRESSION: No evidence of deep venous thrombosis in the right lower extremity. Left common femoral vein also patent. Electronically Signed   By: Bretta BangWilliam  Woodruff III M.D.   On: 05/13/2019 12:24   DG HIP UNILAT WITH PELVIS 1V RIGHT  Result Date: 05/13/2019 CLINICAL DATA:  Right hip pain EXAM: DG HIP (WITH OR WITHOUT PELVIS) 1V RIGHT COMPARISON:  None. FINDINGS: There is no evidence of hip fracture or dislocation. There is no evidence of arthropathy or other focal bone abnormality. Hip joints and SI joints are symmetric and unremarkable. IMPRESSION: Negative. Electronically Signed   By: Charlett NoseKevin  Dover M.D.   On: 05/13/2019 20:20     Assessment & Plan:  Plan  I am having Bodie D. Hudspeth start on NONFORMULARY OR COMPOUNDED ITEM. I am also having him maintain his Turmeric, calcium-vitamin D, Glucosamine-Chondroitin-MSM (TRIPLE FLEX PO), sildenafil, Vitamin D (Ergocalciferol), atorvastatin, meloxicam, metoprolol  succinate, cephALEXin, chlorthalidone, potassium chloride SA, and metFORMIN.  Meds ordered this encounter  Medications  . NONFORMULARY OR COMPOUNDED ITEM    Sig: Compression socks #1  As directed   Dx low ext edema    Dispense:  1 each    Refill:  0  . DISCONTD: metoprolol succinate (TOPROL-XL) 50 MG 24 hr tablet    Sig: Take 1 tablet (50 mg total) by mouth daily. Take with or immediately following a meal.    Dispense:  90 tablet    Refill:  3  . potassium chloride SA (KLOR-CON) 20 MEQ tablet    Sig: Take 1 tablet (20 mEq total) by mouth daily.    Dispense:  90 tablet    Refill:  1  . metFORMIN (GLUCOPHAGE) 500 MG tablet    Sig: Take 1 tablet (500 mg total) by mouth 2 (two) times daily with a meal.    Dispense:  180 tablet    Refill:  1    Problem List Items Addressed This Visit      Unprioritized   Cellulitis of left lower extremity    resolved      Class 3 severe obesity with serious comorbidity and body mass index (BMI) of 50.0 to 59.9 in adult [facility](HCC)    Discussed weight loss and exercise      Relevant Medications   metFORMIN (GLUCOPHAGE) 500 MG tablet   HTN (hypertension)    Well controlled, no changes to meds. Encouraged heart healthy diet such as the DASH diet and exercise as tolerated.       Hyperlipidemia    Encouraged heart healthy diet, increase exercise, avoid trans fats, consider a krill oil cap daily      Hyperlipidemia associated with type 2 diabetes mellitus (HCC)    Encouraged heart healthy diet, increase exercise, avoid trans fats, consider a krill oil cap daily      Relevant Medications   metFORMIN (GLUCOPHAGE) 500 MG tablet   Hypokalemia   Relevant Medications   potassium chloride SA (KLOR-CON) 20 MEQ tablet   Lower extremity edema - Primary    con't diuretics Compression socks       Relevant Medications   NONFORMULARY OR COMPOUNDED ITEM   Prediabetes    Check labs  con't diet       Relevant Medications   metFORMIN (GLUCOPHAGE) 500  MG tablet   Severe obesity (BMI >= 40) (HCC)    D/w pt diet and exercise  Relevant Medications   metFORMIN (GLUCOPHAGE) 500 MG tablet    Other Visit Diagnoses    Insulin resistance          Follow-up: Return in about 3 months (around 11/05/2019), or if symptoms worsen or fail to improve, for hypertension, hyperlipidemia.  Donato Schultz, DO

## 2019-08-05 NOTE — Telephone Encounter (Signed)
Pt has OV today. Will discuss at OV

## 2019-08-06 ENCOUNTER — Other Ambulatory Visit: Payer: Self-pay | Admitting: Family Medicine

## 2019-08-06 ENCOUNTER — Other Ambulatory Visit: Payer: Self-pay

## 2019-08-06 DIAGNOSIS — E782 Mixed hyperlipidemia: Secondary | ICD-10-CM

## 2019-08-06 LAB — COMPREHENSIVE METABOLIC PANEL
ALT: 29 U/L (ref 0–53)
AST: 19 U/L (ref 0–37)
Albumin: 4.2 g/dL (ref 3.5–5.2)
Alkaline Phosphatase: 63 U/L (ref 39–117)
BUN: 17 mg/dL (ref 6–23)
CO2: 30 mEq/L (ref 19–32)
Calcium: 9.4 mg/dL (ref 8.4–10.5)
Chloride: 100 mEq/L (ref 96–112)
Creatinine, Ser: 0.83 mg/dL (ref 0.40–1.50)
GFR: 100.37 mL/min (ref >60.00)
Glucose, Bld: 80 mg/dL (ref 70–99)
Potassium: 3.4 mEq/L — ABNORMAL LOW (ref 3.5–5.1)
Sodium: 138 mEq/L (ref 135–145)
Total Bilirubin: 0.8 mg/dL (ref 0.2–1.2)
Total Protein: 6.6 g/dL (ref 6.0–8.3)

## 2019-08-06 LAB — LIPID PANEL
Cholesterol: 173 mg/dL (ref 0–200)
HDL: 34.9 mg/dL — ABNORMAL LOW (ref >39.00)
NonHDL: 137.81
Total CHOL/HDL Ratio: 5
Triglycerides: 222 mg/dL — ABNORMAL HIGH (ref 0.0–149.0)
VLDL: 44.4 mg/dL — ABNORMAL HIGH (ref 0.0–40.0)

## 2019-08-06 LAB — LDL CHOLESTEROL, DIRECT: Direct LDL: 107 mg/dL

## 2019-08-06 LAB — HEMOGLOBIN A1C: Hgb A1c MFr Bld: 5.7 % (ref 4.6–6.5)

## 2019-08-06 MED ORDER — FENOFIBRATE 160 MG PO TABS: 160.0000 mg | ORAL_TABLET | Freq: Every day | ORAL | 2 refills | Status: DC

## 2019-08-06 NOTE — Assessment & Plan Note (Signed)
hgba1c to be checked minimize simple carbs. Increase exercise as tolerated. Continue current meds 

## 2019-08-06 NOTE — Assessment & Plan Note (Signed)
Discussed weight loss and exercise

## 2019-08-06 NOTE — Assessment & Plan Note (Signed)
resolved 

## 2019-08-06 NOTE — Assessment & Plan Note (Signed)
Encouraged heart healthy diet, increase exercise, avoid trans fats, consider a krill oil cap daily 

## 2019-08-18 ENCOUNTER — Other Ambulatory Visit: Payer: Self-pay | Admitting: Family Medicine

## 2019-08-18 ENCOUNTER — Telehealth: Payer: Self-pay | Admitting: Family Medicine

## 2019-08-18 DIAGNOSIS — M79605 Pain in left leg: Secondary | ICD-10-CM

## 2019-08-18 NOTE — Telephone Encounter (Signed)
Pt states right leg. Pt states more upper leg. Pt states no swelling or redness. Pt states feels likes his leg is falling asleep and states having to lift leg to get into his truck. Pt states having pain with walking. Pt states leg feels heavy and states pain is close to groin area and shooting to thigh. Please advise.

## 2019-08-18 NOTE — Telephone Encounter (Signed)
Patient states that left leg still hurting and patient would like a referral to orthopedic doctor. Patient states that his leg is really heavy.

## 2019-08-22 ENCOUNTER — Ambulatory Visit: Payer: 59 | Admitting: Family Medicine

## 2019-08-22 ENCOUNTER — Encounter: Payer: Self-pay | Admitting: Family Medicine

## 2019-08-22 ENCOUNTER — Other Ambulatory Visit: Payer: Self-pay

## 2019-08-22 DIAGNOSIS — G8929 Other chronic pain: Secondary | ICD-10-CM

## 2019-08-22 DIAGNOSIS — M25551 Pain in right hip: Secondary | ICD-10-CM

## 2019-08-22 DIAGNOSIS — M25561 Pain in right knee: Secondary | ICD-10-CM

## 2019-08-22 MED ORDER — METHYLPREDNISOLONE 4 MG PO TBPK: ORAL_TABLET | ORAL | 0 refills | Status: DC

## 2019-08-22 NOTE — Progress Notes (Signed)
Office Visit Note   Patient: Sean [name]           Date of Birth: 12/04/1975           MRN: [id number] Visit Date: 08/22/2019 Requested by: [address], [place], [place] [address] [place],  [place] [address] PCP: Zola Button, Grayling Congress, DO  Subjective: Chief Complaint  Patient presents with  . Right Leg - Pain    Pain in upper leg to knee. Some groin pain. Occ fluid building up on the knee. Has slowly been progressing over many months. Had xrays of Lsp and right hip in April.    HPI: He is here with right leg pain.  Symptoms started several months ago, gradual buildup of pain.  First began hurting in the groin area but then it started radiating down the leg sometimes.  Intermittent sharp pains.  He uses ibuprofen.  Recently he was given meloxicam and that seems to help some.  Today is better than it was earlier in the week.  He gets occasional low back pain.  He has had problems with his left knee in the past and was given hyaluronic acid injections.  This pain on the right side feels different than that.  He drinks a lot of alcohol at times, but not on a regular basis.  He has not had a lot of oral steroids over the years.  He has borderline diabetes currently well controlled.               ROS:   All other systems were reviewed and are negative.  Objective: Vital Signs: There were no vitals taken for this visit.  Physical Exam:  General:  Alert and oriented, in no acute distress. Pulm:  Breathing unlabored. Psy:  Normal mood, congruent affect.  Right leg: Negative straight leg raise.  He has decreased internal rotation motion in both hips, and passive rotation causes pain on the right.  Lower extremity strength and reflexes are normal.  Right knee has 2+ patellofemoral crepitus, trace effusion.  No warmth or erythema.  No significant joint line tenderness.  Imaging: No results found.  Assessment & Plan: 1.  Right leg pain, suspect that he has early DJD in the  right hip.  Cannot rule out AVN. -We will try a Medrol Dosepak.  Then meloxicam as needed.  If he keeps having severe pain flareups, he will contact me and I will order MRI scan.  2.  Right knee DJD      Procedures: No procedures performed  No notes on file     PMFS History: Patient Active Problem List   Diagnosis Date Noted  . Right thigh pain 05/13/2019  . Uncontrolled type 2 diabetes mellitus with hyperglycemia (HCC) 04/10/2019  . Hyperlipidemia associated with type 2 diabetes mellitus (HCC) 04/10/2019  . Cutaneous skin tags 04/10/2019  . Class 3 severe obesity with serious comorbidity and body mass index (BMI) of 50.0 to 59.9 in adult (HCC) 03/18/2018  . Cellulitis of left lower extremity 09/07/2017  . History of cellulitis 08/19/2017  . Hypokalemia 08/19/2017  . Lower extremity edema 08/19/2017  . Depression 02/15/2017  . Prediabetes 09/19/2016  . Vitamin D deficiency 09/19/2016  . Other fatigue 09/05/2016  . Dyspnea on exertion 09/05/2016  . Snoring 09/05/2016  . Morbid obesity (HCC) 09/05/2016  . Patellofemoral arthritis of right knee 08/11/2016  . Severe obesity (BMI >= 40) (HCC) 05/09/2015  . Hyperlipidemia 02/19/2014  . Sinusitis, acute  maxillary 02/09/2014  . Infected laceration 02/09/2014  . HTN (hypertension) 03/13/2011   Past Medical History:  Diagnosis Date  . Heartburn   . Hyperlipidemia   . Hypertension   . Sleep apnea   . SOB (shortness of breath) on exertion   . Swelling    feet and legs    Family History  Problem Relation Age of Onset  . Heart disease Father 13       MI  . Hypertension Father   . Sudden death Father        Heart disease  . Hyperlipidemia Father   . Obesity Father   . Diabetes Mother        Borderline  . Hyperlipidemia Mother   . Hypertension Mother   . Depression Mother   . Obesity Mother   . Breast cancer Maternal Grandmother     History reviewed. No pertinent surgical history. Social History   Occupational  History  . Occupation: Dietitian  Tobacco Use  . Smoking status: Never Smoker  . Smokeless tobacco: Never Used  Substance and Sexual Activity  . Alcohol use: Yes  . Drug use: No  . Sexual activity: Yes

## 2019-11-06 ENCOUNTER — Ambulatory Visit: Payer: 59 | Admitting: Family Medicine

## 2019-11-20 ENCOUNTER — Ambulatory Visit: Payer: 59 | Admitting: Family Medicine

## 2019-11-25 ENCOUNTER — Encounter: Payer: Self-pay | Admitting: Family Medicine

## 2019-11-25 ENCOUNTER — Ambulatory Visit: Payer: 59 | Admitting: Family Medicine

## 2019-11-25 ENCOUNTER — Other Ambulatory Visit: Payer: Self-pay

## 2019-11-25 VITALS — BP 150/90 | HR 81 | Temp 99.4°F | Resp 20 | Ht 73.0 in | Wt >= 6400 oz

## 2019-11-25 DIAGNOSIS — M25551 Pain in right hip: Secondary | ICD-10-CM | POA: Diagnosis not present

## 2019-11-25 DIAGNOSIS — E785 Hyperlipidemia, unspecified: Secondary | ICD-10-CM

## 2019-11-25 DIAGNOSIS — I10 Essential (primary) hypertension: Secondary | ICD-10-CM

## 2019-11-25 DIAGNOSIS — E8881 Metabolic syndrome: Secondary | ICD-10-CM

## 2019-11-25 DIAGNOSIS — M79651 Pain in right thigh: Secondary | ICD-10-CM | POA: Diagnosis not present

## 2019-11-25 MED ORDER — FENOFIBRATE 160 MG PO TABS: 160.0000 mg | ORAL_TABLET | Freq: Every day | ORAL | 2 refills | Status: DC

## 2019-11-25 MED ORDER — MELOXICAM 15 MG PO TABS: ORAL_TABLET | ORAL | 1 refills | Status: DC

## 2019-11-25 MED ORDER — VALSARTAN 80 MG PO TABS: 80.0000 mg | ORAL_TABLET | Freq: Every day | ORAL | 1 refills | Status: DC

## 2019-11-25 NOTE — Patient Instructions (Signed)
Carbohydrate Counting for Diabetes Mellitus, Adult  Carbohydrate counting is a method of keeping track of how many carbohydrates you eat. Eating carbohydrates naturally increases the amount of sugar (glucose) in the blood. Counting how many carbohydrates you eat helps keep your blood glucose within normal limits, which helps you manage your diabetes (diabetes mellitus). It is important to know how many carbohydrates you can safely have in each meal. This is different for every person. A diet and nutrition specialist (registered dietitian) can help you make a meal plan and calculate how many carbohydrates you should have at each meal and snack. Carbohydrates are found in the following foods:  Grains, such as breads and cereals.  Dried beans and soy products.  Starchy vegetables, such as potatoes, peas, and corn.  Fruit and fruit juices.  Milk and yogurt.  Sweets and snack foods, such as cake, cookies, candy, chips, and soft drinks. How do I count carbohydrates? There are two ways to count carbohydrates in food. You can use either of the methods or a combination of both. Reading "Nutrition Facts" on packaged food The "Nutrition Facts" list is included on the labels of almost all packaged foods and beverages in the [place] It includes:  The serving size.  Information about nutrients in each serving, including the grams (g) of carbohydrate per serving. To use the "Nutrition Facts":  Decide how many servings you will have.  Multiply the number of servings by the number of carbohydrates per serving.  The resulting number is the total amount of carbohydrates that you will be having. Learning standard serving sizes of other foods When you eat carbohydrate foods that are not packaged or do not include "Nutrition Facts" on the label, you need to measure the servings in order to count the amount of carbohydrates:  Measure the foods that you will eat with a food scale or measuring cup, if  needed.  Decide how many standard-size servings you will eat.  Multiply the number of servings by 15. Most carbohydrate-rich foods have about 15 g of carbohydrates per serving. ? For example, if you eat 8 oz (170 g) of strawberries, you will have eaten 2 servings and 30 g of carbohydrates (2 servings x 15 g = 30 g).  For foods that have more than one food mixed, such as soups and casseroles, you must count the carbohydrates in each food that is included. The following list contains standard serving sizes of common carbohydrate-rich foods. Each of these servings has about 15 g of carbohydrates:   hamburger bun or  English muffin.   oz (15 mL) syrup.   oz (14 g) jelly.  1 slice of bread.  1 six-inch tortilla.  3 oz (85 g) cooked rice or pasta.  4 oz (113 g) cooked dried beans.  4 oz (113 g) starchy vegetable, such as peas, corn, or potatoes.  4 oz (113 g) hot cereal.  4 oz (113 g) mashed potatoes or  of a large baked potato.  4 oz (113 g) canned or frozen fruit.  4 oz (120 mL) fruit juice.  4-6 crackers.  6 chicken nuggets.  6 oz (170 g) unsweetened dry cereal.  6 oz (170 g) plain fat-free yogurt or yogurt sweetened with artificial sweeteners.  8 oz (240 mL) milk.  8 oz (170 g) fresh fruit or one small piece of fruit.  24 oz (680 g) popped popcorn. Example of carbohydrate counting Sample meal  3 oz (85 g) chicken breast.  6 oz (170 g)   brown rice.  4 oz (113 g) corn.  8 oz (240 mL) milk.  8 oz (170 g) strawberries with sugar-free whipped topping. Carbohydrate calculation 1. Identify the foods that contain carbohydrates: ? Rice. ? Corn. ? Milk. ? Strawberries. 2. Calculate how many servings you have of each food: ? 2 servings rice. ? 1 serving corn. ? 1 serving milk. ? 1 serving strawberries. 3. Multiply each number of servings by 15 g: ? 2 servings rice x 15 g = 30 g. ? 1 serving corn x 15 g = 15 g. ? 1 serving milk x 15 g = 15 g. ? 1  serving strawberries x 15 g = 15 g. 4. Add together all of the amounts to find the total grams of carbohydrates eaten: ? 30 g + 15 g + 15 g + 15 g = 75 g of carbohydrates total. Summary  Carbohydrate counting is a method of keeping track of how many carbohydrates you eat.  Eating carbohydrates naturally increases the amount of sugar (glucose) in the blood.  Counting how many carbohydrates you eat helps keep your blood glucose within normal limits, which helps you manage your diabetes.  A diet and nutrition specialist (registered dietitian) can help you make a meal plan and calculate how many carbohydrates you should have at each meal and snack. This information is not intended to replace advice given to you by your health care provider. Make sure you discuss any questions you have with your health care provider. Document Revised: 08/17/2016 Document Reviewed: 07/07/2015 Elsevier Patient Education  2020 [employer]  

## 2019-11-25 NOTE — Assessment & Plan Note (Signed)
Encouraged heart healthy diet, increase exercise, avoid trans fats, consider a krill oil cap daily con't meds ---- fenofibrate, and lipitor

## 2019-11-25 NOTE — Progress Notes (Signed)
Patient ID: Sean [name], male    DOB: [birth year]  Age: 44 y.o. MRN: [id number]    Subjective:  Subjective  HPI Sean [name] presents for f/u bp , chol  Review of Systems  Constitutional: Negative for appetite change, diaphoresis, fatigue and unexpected weight change.  Eyes: Negative for pain, redness and visual disturbance.  Respiratory: Negative for cough, chest tightness, shortness of breath and wheezing.   Cardiovascular: Negative for chest pain, palpitations and leg swelling.  Endocrine: Negative for cold intolerance, heat intolerance, polydipsia, polyphagia and polyuria.  Genitourinary: Negative for difficulty urinating, dysuria and frequency.  Musculoskeletal: Positive for arthralgias and gait problem.  Neurological: Negative for dizziness, light-headedness, numbness and headaches.    History Past Medical History:  Diagnosis Date  . Heartburn   . Hyperlipidemia   . Hypertension   . Sleep apnea   . SOB (shortness of breath) on exertion   . Swelling    feet and legs    He has no past surgical history on file.   His family history includes Breast cancer in his maternal grandmother; Depression in his mother; Diabetes in his mother; Heart disease (age of onset: 57) in his father; Hyperlipidemia in his father and mother; Hypertension in his father and mother; Obesity in his father and mother; Sudden death in his father.He reports that he has never smoked. He has never used smokeless tobacco. He reports current alcohol use. He reports that he does not use drugs.  Current Outpatient Medications on File Prior to Visit  Medication Sig Dispense Refill  . atorvastatin (LIPITOR) 40 MG tablet Take 1 tablet (40 mg total) by mouth daily. 90 tablet 3  . calcium-vitamin D 250-100 MG-UNIT tablet Take 1 tablet by mouth 2 (two) times daily.    . chlorthalidone (HYGROTON) 25 MG tablet Take 1 tablet (25 mg total) by mouth daily. 90 tablet 1  . Glucosamine-Chondroitin-MSM (TRIPLE FLEX PO)  Take by mouth.    . metFORMIN (GLUCOPHAGE) 500 MG tablet Take 1 tablet (500 mg total) by mouth 2 (two) times daily with a meal. 180 tablet 1  . metoprolol succinate (TOPROL-XL) 100 MG 24 hr tablet 1 1/2 po qd 135 tablet 3  . NONFORMULARY OR COMPOUNDED ITEM Compression socks #1  As directed   Dx low ext edema 1 each 0  . potassium chloride SA (KLOR-CON) 20 MEQ tablet Take 1 tablet (20 mEq total) by mouth daily. 90 tablet 1  . sildenafil (VIAGRA) 100 MG tablet Take 0.5-1 tablets (50-100 mg total) by mouth daily as needed for erectile dysfunction. 5 tablet 11  . Turmeric 500 MG TABS Take by mouth.    . Vitamin D, Ergocalciferol, (DRISDOL) 1.25 MG (50000 UT) CAPS capsule Take 1 capsule (50,000 Units total) by mouth every 7 (seven) days. 4 capsule 0   No current facility-administered medications on file prior to visit.     Objective:  Objective  Physical Exam Vitals and nursing note reviewed.  Constitutional:      General: He is sleeping.     Appearance: He is well-developed.  HENT:     Head: Normocephalic and atraumatic.  Eyes:     Pupils: Pupils are equal, round, and reactive to light.  Neck:     Thyroid: No thyromegaly.  Cardiovascular:     Rate and Rhythm: Normal rate and regular rhythm.     Heart sounds: No murmur heard.   Pulmonary:     Effort: Pulmonary effort is normal. No respiratory distress.  Breath sounds: Normal breath sounds. No wheezing or rales.  Chest:     Chest wall: No tenderness.  Musculoskeletal:        General: No tenderness.     Cervical back: Normal range of motion and neck supple.  Skin:    General: Skin is warm and dry.  Neurological:     Mental Status: He is oriented to person, place, and time.  Psychiatric:        Behavior: Behavior normal.        Thought Content: Thought content normal.        Judgment: Judgment normal.    BP (!) 150/90 (BP Location: Right Arm, Patient Position: Sitting, Cuff Size: Large)   Pulse 81   Temp 99.4 F (37.4 C)  (Oral)   Resp 20   Ht 6\' 1"  (1.854 m)   Wt (!) 421 lb 9.6 oz (191.2 kg)   SpO2 97%   BMI 55.62 kg/m  Wt Readings from Last 3 Encounters:  11/25/19 (!) 421 lb 9.6 oz (191.2 kg)  08/05/19 (!) 425 lb 9.6 oz (193.1 kg)  07/22/19 (!) 422 lb (191.4 kg)     Lab Results  Component Value Date   WBC 11.6 (H) 07/22/2019   HGB 14.6 07/22/2019   HCT 42.6 07/22/2019   PLT 212.0 07/22/2019   GLUCOSE 80 08/05/2019   CHOL 173 08/05/2019   TRIG 222.0 (H) 08/05/2019   HDL 34.90 (L) 08/05/2019   LDLDIRECT 107.0 08/05/2019   LDLCALC 93 04/10/2019   ALT 29 08/05/2019   AST 19 08/05/2019   NA 138 08/05/2019   K 3.4 (L) 08/05/2019   CL 100 08/05/2019   CREATININE 0.83 08/05/2019   BUN 17 08/05/2019   CO2 30 08/05/2019   TSH 2.740 03/14/2018   HGBA1C 5.7 08/05/2019    DG Lumbar Spine Complete  Result Date: 05/13/2019 CLINICAL DATA:  Right groin/leg pain EXAM: LUMBAR SPINE - COMPLETE 4+ VIEW COMPARISON:  None. FINDINGS: Mild degenerative facet disease throughout the lumbar spine, most pronounced at the lower lumbar spine. Disc spaces maintained. Normal alignment. Mild anterior spurring in the lower thoracic and upper lumbar spine. No fracture. SI joints symmetric and unremarkable. IMPRESSION: Early degenerative disc and mild degenerative facet disease as above. No acute bony abnormality. Electronically Signed   By: 07/13/2019 M.D.   On: 05/13/2019 20:19   07/13/2019 Venous Img Lower Unilateral Right (DVT)  Result Date: 05/13/2019 CLINICAL DATA:  Right thigh region pain EXAM: RIGHT LOWER EXTREMITY VENOUS DUPLEX ULTRASOUND TECHNIQUE: Gray-scale sonography with graded compression, as well as color Doppler and duplex ultrasound were performed to evaluate the right lower extremity deep venous system from the level of the common femoral vein and including the common femoral, femoral, profunda femoral, popliteal and calf veins including the posterior tibial, peroneal and gastrocnemius veins when visible. The  superficial great saphenous vein was also interrogated. Spectral Doppler was utilized to evaluate flow at rest and with distal augmentation maneuvers in the common femoral, femoral and popliteal veins. COMPARISON:  None. FINDINGS: Contralateral Common Femoral Vein: Respiratory phasicity is normal and symmetric with the symptomatic side. No evidence of thrombus. Normal compressibility. Common Femoral Vein: No evidence of thrombus. Normal compressibility, respiratory phasicity and response to augmentation. Saphenofemoral Junction: No evidence of thrombus. Normal compressibility and flow on color Doppler imaging. Profunda Femoral Vein: No evidence of thrombus. Normal compressibility and flow on color Doppler imaging. Femoral Vein: No evidence of thrombus. Normal compressibility, respiratory phasicity and response to augmentation.  Popliteal Vein: No evidence of thrombus. Normal compressibility, respiratory phasicity and response to augmentation. Calf Veins: No evidence of thrombus. Normal compressibility and flow on color Doppler imaging. Superficial Great Saphenous Vein: No evidence of thrombus. Normal compressibility. Venous Reflux:  None. Other Findings:  None. IMPRESSION: No evidence of deep venous thrombosis in the right lower extremity. Left common femoral vein also patent. Electronically Signed   By: Bretta Bang III M.D.   On: 05/13/2019 12:24   DG HIP UNILAT WITH PELVIS 1V RIGHT  Result Date: 05/13/2019 CLINICAL DATA:  Right hip pain EXAM: DG HIP (WITH OR WITHOUT PELVIS) 1V RIGHT COMPARISON:  None. FINDINGS: There is no evidence of hip fracture or dislocation. There is no evidence of arthropathy or other focal bone abnormality. Hip joints and SI joints are symmetric and unremarkable. IMPRESSION: Negative. Electronically Signed   By: Charlett Nose M.D.   On: 05/13/2019 20:20     Assessment & Plan:  Plan  I have discontinued Macky D. Fretz's methylPREDNISolone. I am also having him start on valsartan.  Additionally, I am having him maintain his Turmeric, calcium-vitamin D, Glucosamine-Chondroitin-MSM (TRIPLE FLEX PO), sildenafil, Vitamin D (Ergocalciferol), atorvastatin, metoprolol succinate, chlorthalidone, NONFORMULARY OR COMPOUNDED ITEM, potassium chloride SA, metFORMIN, fenofibrate, and meloxicam.  Meds ordered this encounter  Medications  . fenofibrate 160 MG tablet    Sig: Take 1 tablet (160 mg total) by mouth daily.    Dispense:  30 tablet    Refill:  2  . valsartan (DIOVAN) 80 MG tablet    Sig: Take 1 tablet (80 mg total) by mouth daily.    Dispense:  90 tablet    Refill:  1  . meloxicam (MOBIC) 15 MG tablet    Sig: 1/2-1 po qd prn pain    Dispense:  30 tablet    Refill:  1    Problem List Items Addressed This Visit      Unprioritized   HTN (hypertension) - Primary    Poorly controlled will alter medications, encouraged DASH diet, minimize caffeine and obtain adequate sleep. Report concerning symptoms and follow up as directed and as needed      Relevant Medications   fenofibrate 160 MG tablet   valsartan (DIOVAN) 80 MG tablet   Other Relevant Orders   Microalbumin / creatinine urine ratio   Hyperlipidemia    Encouraged heart healthy diet, increase exercise, avoid trans fats, consider a krill oil cap daily con't meds ---- fenofibrate, and lipitor       Relevant Medications   fenofibrate 160 MG tablet   valsartan (DIOVAN) 80 MG tablet   Other Relevant Orders   Lipid panel   Comprehensive metabolic panel   Right thigh pain   Relevant Medications   meloxicam (MOBIC) 15 MG tablet   Uncontrolled type 2 diabetes mellitus with hyperglycemia (HCC)   Relevant Medications   valsartan (DIOVAN) 80 MG tablet    Other Visit Diagnoses    Right hip pain       Relevant Medications   meloxicam (MOBIC) 15 MG tablet      Follow-up: Return in about 4 weeks (around 12/23/2019) for hypertension.  Donato Schultz, DO

## 2019-11-25 NOTE — Assessment & Plan Note (Signed)
Poorly controlled will alter medications, encouraged DASH diet, minimize caffeine and obtain adequate sleep. Report concerning symptoms and follow up as directed and as needed 

## 2019-11-26 LAB — MICROALBUMIN / CREATININE URINE RATIO
Creatinine, Urine: 116 mg/dL (ref 20–320)
Microalb Creat Ratio: 7 mcg/mg creat (ref <30)
Microalb, Ur: 0.8 mg/dL

## 2019-11-26 LAB — HEMOGLOBIN A1C
Hgb A1c MFr Bld: 5.7 % of total Hgb — ABNORMAL HIGH (ref <5.7)
Mean Plasma Glucose: 117 (calc)
eAG (mmol/L): 6.5 (calc)

## 2019-11-26 LAB — COMPREHENSIVE METABOLIC PANEL
AG Ratio: 1.6 (calc) (ref 1.0–2.5)
ALT: 34 U/L (ref 9–46)
AST: 23 U/L (ref 10–40)
Albumin: 4.1 g/dL (ref 3.6–5.1)
Alkaline phosphatase (APISO): 59 U/L (ref 36–130)
BUN: 15 mg/dL (ref 7–25)
CO2: 27 mmol/L (ref 20–32)
Calcium: 9.2 mg/dL (ref 8.6–10.3)
Chloride: 103 mmol/L (ref 98–110)
Creat: 0.98 mg/dL (ref 0.60–1.35)
Globulin: 2.5 g/dL (calc) (ref 1.9–3.7)
Glucose, Bld: 92 mg/dL (ref 65–99)
Potassium: 3.6 mmol/L (ref 3.5–5.3)
Sodium: 139 mmol/L (ref 135–146)
Total Bilirubin: 0.5 mg/dL (ref 0.2–1.2)
Total Protein: 6.6 g/dL (ref 6.1–8.1)

## 2019-11-26 LAB — INSULIN, RANDOM: Insulin: 44 u[IU]/mL — ABNORMAL HIGH

## 2019-11-26 LAB — LIPID PANEL
Cholesterol: 159 mg/dL (ref <200)
HDL: 38 mg/dL — ABNORMAL LOW (ref >=40)
LDL Cholesterol (Calc): 99 mg/dL (calc)
Non-HDL Cholesterol (Calc): 121 mg/dL (calc) (ref <130)
Total CHOL/HDL Ratio: 4.2 (calc) (ref <5.0)
Triglycerides: 120 mg/dL (ref <150)

## 2019-12-02 ENCOUNTER — Other Ambulatory Visit: Payer: Self-pay

## 2019-12-02 ENCOUNTER — Ambulatory Visit: Payer: Self-pay

## 2019-12-02 ENCOUNTER — Encounter: Payer: Self-pay | Admitting: Family Medicine

## 2019-12-02 ENCOUNTER — Ambulatory Visit: Payer: 59 | Admitting: Family Medicine

## 2019-12-02 DIAGNOSIS — M25551 Pain in right hip: Secondary | ICD-10-CM | POA: Diagnosis not present

## 2019-12-02 NOTE — Progress Notes (Signed)
I saw and examined the patient with Dr. Marga Hoots and agree with assessment and plan as outlined.    Ongoing right hip and leg pain.  Still seems to localize to hip.    Injection given today with good results.  If relief is not long-lasting, then MRI of hip.

## 2019-12-02 NOTE — Progress Notes (Signed)
Office Visit Note   Patient: Sean [name]           Date of Birth: [birth year]           MRN: [id number] Visit Date: 12/02/2019 Requested by: [address] [place], [place] [address] [place],  [place] [address] PCP: Zola Button, Grayling Congress, DO  Subjective: Chief Complaint  Patient presents with  . 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 Last day 01-05-20. Leg still feels "like dead weight" with tingling radiating down the leg. Pain worsens the longer he is on his feet. Requests injection(s), if they might help.    HPI: 44yo M with chronic R leg pain, presenting to clinic with concerns of ongoing pain. Patient states that he feels a deep pain in his groin, that will occasionally radiate down his leg. He says that if he stands on his leg for an extended shift, he will occasionally feel numbness throughout the leg and into his foot. He feels like this numbness may be due to the fact that when his groin is very painful, he stands very crooked. He says he's going to quit work due to this discomfort, as he doesn't feel he can continue with his very long shifts. He is interested in trying an injection today to se if this improves his pain. He has had a knee injection in the past, and says this helped him significantly. He is hoping a hip injection will have the same results.               ROS:   All other systems were reviewed and are negative.  Objective: Vital Signs: There were no vitals taken for this visit.  Physical Exam:  General:  Alert and oriented, in no acute distress. Pulm:  Breathing unlabored. Psy:  Normal mood, congruent affect. Skin:  Right inguinal area with no bruising or rashes. Overlying skin intact.   Right hip:  Pain with log roll. Pain with FADIR.  Limited ROM with internal rotation of the hip due to pain. No significant pain with external rotation, and ROM is preserved.  5/5 strength with hip flexion,  abduction/adduction, knee flexion/extension. Ankle dorsi/plantarflexion.  Sensation intact throughout BLE.   Imaging: US Guided Needle Placement  Result Date: 12/02/2019 Ultrasound-guided right hip injection: After sterile prep with Betadine, injected 8 cc 1% lidocaine without epinephrine and 40 mg methylprednisolone using a 22-gauge spinal needle, passing the needle through the iliofemoral ligament into the femoral head/neck junction.  Injectate seen filling joint capsule.  Good relief during anesthetic phase.     Assessment & Plan: 44yo M presenting to clinic with concerns of R hip pain. Examination as above, which is consistent with deep hip articular pathology, through his complaints about occasional numbness is concerning for possible back pathology. Hip intraarticular injection performed today, as diagnostic/therapeutic. Patient tolerated injection very well, and did voice immediate improvement in his pain.  - If he continues to experience radicular symptoms/pain, RTC for reevauation - If injection offers significant improvement, hip pathology is likely source of his discomfort.  - Patient had no further questions or concerns. Strict return precautions were dicussed.      Procedures: Right Hip Intraarticular Cortisone Injection:  Risks and benefits of procedure discussed, Patient opted to proceed. Verbal Consent obtained.  Timeout performed.  Skin prepped in a sterile fashion with betadine before further cleansing with alcohol. Ethyl Chloride was used  for topical analgesia.  Right Hip was injected with 8cc 1% Lidocaine without epinephrine via the anterior approach under Ultrasound Guidance using a 22G, spinal needle. Syringe was removed from the needle, and 40mg  methylprednisolone was then injected into the joint.   Patient tolerated the injection well with no immediate complications. Aftercare instructions were discussed, and patient was given strict return precautions.       PMFS History: Patient Active Problem List   Diagnosis Date Noted  . Right thigh pain 05/13/2019  . Uncontrolled type 2 diabetes mellitus with hyperglycemia (HCC) 04/10/2019  . Hyperlipidemia associated with type 2 diabetes mellitus (HCC) 04/10/2019  . Cutaneous skin tags 04/10/2019  . Class 3 severe obesity with serious comorbidity and body mass index (BMI) of 50.0 to 59.9 in adult (HCC) 03/18/2018  . Cellulitis of left lower extremity 09/07/2017  . History of cellulitis 08/19/2017  . Hypokalemia 08/19/2017  . Lower extremity edema 08/19/2017  . Depression 02/15/2017  . Prediabetes 09/19/2016  . Vitamin D deficiency 09/19/2016  . Other fatigue 09/05/2016  . Dyspnea on exertion 09/05/2016  . Snoring 09/05/2016  . Morbid obesity (HCC) 09/05/2016  . Patellofemoral arthritis of right knee 08/11/2016  . Severe obesity (BMI >= 40) (HCC) 05/09/2015  . Hyperlipidemia 02/19/2014  . Sinusitis, acute maxillary 02/09/2014  . Infected laceration 02/09/2014  . HTN (hypertension) 03/13/2011   Past Medical History:  Diagnosis Date  . Heartburn   . Hyperlipidemia   . Hypertension   . Sleep apnea   . SOB (shortness of breath) on exertion   . Swelling    feet and legs    Family History  Problem Relation Age of Onset  . Heart disease Father 77       MI  . Hypertension Father   . Sudden death Father        Heart disease  . Hyperlipidemia Father   . Obesity Father   . Diabetes Mother        Borderline  . Hyperlipidemia Mother   . Hypertension Mother   . Depression Mother   . Obesity Mother   . Breast cancer Maternal Grandmother     History reviewed. No pertinent surgical history. Social History   Occupational History  . Occupation: 58  Tobacco Use  . Smoking status: Never Smoker  . Smokeless tobacco: Never Used  Substance and Sexual Activity  . Alcohol use: Yes  . Drug use: No  . Sexual activity: Yes

## 2019-12-03 ENCOUNTER — Other Ambulatory Visit: Payer: Self-pay

## 2019-12-05 ENCOUNTER — Telehealth: Payer: Self-pay

## 2019-12-05 ENCOUNTER — Other Ambulatory Visit: Payer: Self-pay

## 2019-12-05 MED ORDER — OZEMPIC (0.25 OR 0.5 MG/DOSE) 2 MG/1.5ML ~~LOC~~ SOPN: 0.2500 mg | PEN_INJECTOR | SUBCUTANEOUS | 1 refills | Status: DC

## 2019-12-05 NOTE — Telephone Encounter (Signed)
PA approved.   Request Reference Number: [id number]. [employer] INJ 2/1.5ML is approved through 12/04/2020. Your patient may now fill this prescription and it will be covered.

## 2019-12-05 NOTE — Telephone Encounter (Signed)
PA initiated via Covermymeds; KEY: [id number]. Awaiting determination.

## 2019-12-23 ENCOUNTER — Encounter: Payer: Self-pay | Admitting: Family Medicine

## 2019-12-23 ENCOUNTER — Other Ambulatory Visit: Payer: Self-pay

## 2019-12-23 ENCOUNTER — Ambulatory Visit: Payer: 59 | Admitting: Family Medicine

## 2019-12-23 VITALS — BP 148/90 | HR 77 | Temp 99.4°F | Resp 18 | Ht 73.0 in | Wt >= 6400 oz

## 2019-12-23 DIAGNOSIS — I10 Essential (primary) hypertension: Secondary | ICD-10-CM | POA: Diagnosis not present

## 2019-12-23 DIAGNOSIS — Z6841 Body Mass Index (BMI) 40.0 and over, adult: Secondary | ICD-10-CM

## 2019-12-23 DIAGNOSIS — E785 Hyperlipidemia, unspecified: Secondary | ICD-10-CM | POA: Diagnosis not present

## 2019-12-23 DIAGNOSIS — E876 Hypokalemia: Secondary | ICD-10-CM | POA: Diagnosis not present

## 2019-12-23 DIAGNOSIS — R7303 Prediabetes: Secondary | ICD-10-CM | POA: Diagnosis not present

## 2019-12-23 MED ORDER — METFORMIN HCL 500 MG PO TABS: 500.0000 mg | ORAL_TABLET | Freq: Two times a day (BID) | ORAL | 1 refills | Status: DC

## 2019-12-23 MED ORDER — FENOFIBRATE 160 MG PO TABS: 160.0000 mg | ORAL_TABLET | Freq: Every day | ORAL | 3 refills | Status: DC

## 2019-12-23 MED ORDER — ATORVASTATIN CALCIUM 40 MG PO TABS: 40.0000 mg | ORAL_TABLET | Freq: Every day | ORAL | 3 refills | Status: DC

## 2019-12-23 MED ORDER — CHLORTHALIDONE 25 MG PO TABS: 25.0000 mg | ORAL_TABLET | Freq: Every day | ORAL | 1 refills | Status: DC

## 2019-12-23 MED ORDER — VALSARTAN 160 MG PO TABS: 160.0000 mg | ORAL_TABLET | Freq: Every day | ORAL | 2 refills | Status: DC

## 2019-12-23 MED ORDER — METOPROLOL SUCCINATE ER 100 MG PO TB24: ORAL_TABLET | ORAL | 3 refills | Status: DC

## 2019-12-23 MED ORDER — OZEMPIC (0.25 OR 0.5 MG/DOSE) 2 MG/1.5ML ~~LOC~~ SOPN: 0.2500 mg | PEN_INJECTOR | SUBCUTANEOUS | 1 refills | Status: DC

## 2019-12-23 MED ORDER — POTASSIUM CHLORIDE CRYS ER 20 MEQ PO TBCR: 20.0000 meq | EXTENDED_RELEASE_TABLET | Freq: Every day | ORAL | 1 refills | Status: DC

## 2019-12-23 NOTE — Patient Instructions (Signed)
Carbohydrate Counting for Diabetes Mellitus, Adult  Carbohydrate counting is a method of keeping track of how many carbohydrates you eat. Eating carbohydrates naturally increases the amount of sugar (glucose) in the blood. Counting how many carbohydrates you eat helps keep your blood glucose within normal limits, which helps you manage your diabetes (diabetes mellitus). It is important to know how many carbohydrates you can safely have in each meal. This is different for every person. A diet and nutrition specialist (registered dietitian) can help you make a meal plan and calculate how many carbohydrates you should have at each meal and snack. Carbohydrates are found in the following foods:  Grains, such as breads and cereals.  Dried beans and soy products.  Starchy vegetables, such as potatoes, peas, and corn.  Fruit and fruit juices.  Milk and yogurt.  Sweets and snack foods, such as cake, cookies, candy, chips, and soft drinks. How do I count carbohydrates? There are two ways to count carbohydrates in food. You can use either of the methods or a combination of both. Reading "Nutrition Facts" on packaged food The "Nutrition Facts" list is included on the labels of almost all packaged foods and beverages in the [place] It includes:  The serving size.  Information about nutrients in each serving, including the grams (g) of carbohydrate per serving. To use the "Nutrition Facts":  Decide how many servings you will have.  Multiply the number of servings by the number of carbohydrates per serving.  The resulting number is the total amount of carbohydrates that you will be having. Learning standard serving sizes of other foods When you eat carbohydrate foods that are not packaged or do not include "Nutrition Facts" on the label, you need to measure the servings in order to count the amount of carbohydrates:  Measure the foods that you will eat with a food scale or measuring cup, if  needed.  Decide how many standard-size servings you will eat.  Multiply the number of servings by 15. Most carbohydrate-rich foods have about 15 g of carbohydrates per serving. ? For example, if you eat 8 oz (170 g) of strawberries, you will have eaten 2 servings and 30 g of carbohydrates (2 servings x 15 g = 30 g).  For foods that have more than one food mixed, such as soups and casseroles, you must count the carbohydrates in each food that is included. The following list contains standard serving sizes of common carbohydrate-rich foods. Each of these servings has about 15 g of carbohydrates:   hamburger bun or  English muffin.   oz (15 mL) syrup.   oz (14 g) jelly.  1 slice of bread.  1 six-inch tortilla.  3 oz (85 g) cooked rice or pasta.  4 oz (113 g) cooked dried beans.  4 oz (113 g) starchy vegetable, such as peas, corn, or potatoes.  4 oz (113 g) hot cereal.  4 oz (113 g) mashed potatoes or  of a large baked potato.  4 oz (113 g) canned or frozen fruit.  4 oz (120 mL) fruit juice.  4-6 crackers.  6 chicken nuggets.  6 oz (170 g) unsweetened dry cereal.  6 oz (170 g) plain fat-free yogurt or yogurt sweetened with artificial sweeteners.  8 oz (240 mL) milk.  8 oz (170 g) fresh fruit or one small piece of fruit.  24 oz (680 g) popped popcorn. Example of carbohydrate counting Sample meal  3 oz (85 g) chicken breast.  6 oz (170 g)   brown rice.  4 oz (113 g) corn.  8 oz (240 mL) milk.  8 oz (170 g) strawberries with sugar-free whipped topping. Carbohydrate calculation 1. Identify the foods that contain carbohydrates: ? Rice. ? Corn. ? Milk. ? Strawberries. 2. Calculate how many servings you have of each food: ? 2 servings rice. ? 1 serving corn. ? 1 serving milk. ? 1 serving strawberries. 3. Multiply each number of servings by 15 g: ? 2 servings rice x 15 g = 30 g. ? 1 serving corn x 15 g = 15 g. ? 1 serving milk x 15 g = 15 g. ? 1  serving strawberries x 15 g = 15 g. 4. Add together all of the amounts to find the total grams of carbohydrates eaten: ? 30 g + 15 g + 15 g + 15 g = 75 g of carbohydrates total. Summary  Carbohydrate counting is a method of keeping track of how many carbohydrates you eat.  Eating carbohydrates naturally increases the amount of sugar (glucose) in the blood.  Counting how many carbohydrates you eat helps keep your blood glucose within normal limits, which helps you manage your diabetes.  A diet and nutrition specialist (registered dietitian) can help you make a meal plan and calculate how many carbohydrates you should have at each meal and snack. This information is not intended to replace advice given to you by your health care provider. Make sure you discuss any questions you have with your health care provider. Document Revised: 08/17/2016 Document Reviewed: 07/07/2015 Elsevier Patient Education  2020 [employer]  

## 2019-12-23 NOTE — Progress Notes (Signed)
Patient ID: Sean [name], male    DOB: [birth year]  Age: 44 y.o. MRN: [id number]    Subjective:  Subjective  HPI Sean Graffvan D Hodge presents for bp f/u.   No other complaints    Review of Systems  Constitutional: Negative.   HENT: Negative for congestion, ear pain, hearing loss, nosebleeds, postnasal drip, rhinorrhea, sinus pressure, sneezing and tinnitus.   Eyes: Negative for photophobia, discharge, itching and visual disturbance.  Respiratory: Negative.   Cardiovascular: Negative.   Gastrointestinal: Negative for abdominal distention, abdominal pain, anal bleeding, blood in stool and constipation.  Endocrine: Negative.   Genitourinary: Negative.   Musculoskeletal: Negative.   Skin: Negative.   Allergic/Immunologic: Negative.   Neurological: Negative for dizziness, weakness, light-headedness, numbness and headaches.  Psychiatric/Behavioral: Negative for agitation, confusion, decreased concentration, dysphoric mood, sleep disturbance and suicidal ideas. The patient is not nervous/anxious.     History Past Medical History:  Diagnosis Date   Heartburn    Hyperlipidemia    Hypertension    Sleep apnea    SOB (shortness of breath) on exertion    Swelling    feet and legs    He has no past surgical history on file.   His family history includes Breast cancer in his maternal grandmother; Depression in his mother; Diabetes in his mother; Heart disease (age of onset: 2756) in his father; Hyperlipidemia in his father and mother; Hypertension in his father and mother; Obesity in his father and mother; Sudden death in his father.He reports that he has never smoked. He has never used smokeless tobacco. He reports current alcohol use. He reports that he does not use drugs.  Current Outpatient Medications on File Prior to Visit  Medication Sig Dispense Refill   calcium-vitamin D 250-100 MG-UNIT tablet Take 1 tablet by mouth 2 (two) times daily.     Glucosamine-Chondroitin-MSM (TRIPLE  FLEX PO) Take by mouth.     meloxicam (MOBIC) 15 MG tablet 1/2-1 po qd prn pain 30 tablet 1   NONFORMULARY OR COMPOUNDED ITEM Compression socks #1  As directed   Dx low ext edema 1 each 0   sildenafil (VIAGRA) 100 MG tablet Take 0.5-1 tablets (50-100 mg total) by mouth daily as needed for erectile dysfunction. 5 tablet 11   Turmeric 500 MG TABS Take by mouth.     Vitamin D, Ergocalciferol, (DRISDOL) 1.25 MG (50000 UT) CAPS capsule Take 1 capsule (50,000 Units total) by mouth every 7 (seven) days. 4 capsule 0   No current facility-administered medications on file prior to visit.     Objective:  Objective  Physical Exam Vitals and nursing note reviewed.  Constitutional:      General: He is sleeping.     Appearance: He is well-developed.  HENT:     Head: Normocephalic and atraumatic.  Eyes:     Pupils: Pupils are equal, round, and reactive to light.  Neck:     Thyroid: No thyromegaly.  Cardiovascular:     Rate and Rhythm: Normal rate and regular rhythm.     Heart sounds: No murmur heard.   Pulmonary:     Effort: Pulmonary effort is normal. No respiratory distress.     Breath sounds: Normal breath sounds. No wheezing or rales.  Chest:     Chest wall: No tenderness.  Musculoskeletal:        General: No tenderness.     Cervical back: Normal range of motion and neck supple.  Skin:    General: Skin is warm and  dry.  Neurological:     Mental Status: He is oriented to person, place, and time.  Psychiatric:        Behavior: Behavior normal.        Thought Content: Thought content normal.        Judgment: Judgment normal.    BP (!) 148/90 (BP Location: Left Arm, Patient Position: Sitting, Cuff Size: Large)    Pulse 77    Temp 99.4 F (37.4 C) (Oral)    Resp 18    Ht 6\' 1"  (1.854 m)    Wt (!) 422 lb 9.6 oz (191.7 kg)    SpO2 97%    BMI 55.76 kg/m  Wt Readings from Last 3 Encounters:  12/23/19 (!) 422 lb 9.6 oz (191.7 kg)  11/25/19 (!) 421 lb 9.6 oz (191.2 kg)  08/05/19 (!)  425 lb 9.6 oz (193.1 kg)     Lab Results  Component Value Date   WBC 11.6 (H) 07/22/2019   HGB 14.6 07/22/2019   HCT 42.6 07/22/2019   PLT 212.0 07/22/2019   GLUCOSE 92 11/25/2019   CHOL 159 11/25/2019   TRIG 120 11/25/2019   HDL 38 (L) 11/25/2019   LDLDIRECT 107.0 08/05/2019   LDLCALC 99 11/25/2019   ALT 34 11/25/2019   AST 23 11/25/2019   NA 139 11/25/2019   K 3.6 11/25/2019   CL 103 11/25/2019   CREATININE 0.98 11/25/2019   BUN 15 11/25/2019   CO2 27 11/25/2019   TSH 2.740 03/14/2018   HGBA1C 5.7 (H) 11/25/2019   MICROALBUR 0.8 11/25/2019    DG Lumbar Spine Complete  Result Date: 05/13/2019 CLINICAL DATA:  Right groin/leg pain EXAM: LUMBAR SPINE - COMPLETE 4+ VIEW COMPARISON:  None. FINDINGS: Mild degenerative facet disease throughout the lumbar spine, most pronounced at the lower lumbar spine. Disc spaces maintained. Normal alignment. Mild anterior spurring in the lower thoracic and upper lumbar spine. No fracture. SI joints symmetric and unremarkable. IMPRESSION: Early degenerative disc and mild degenerative facet disease as above. No acute bony abnormality. Electronically Signed   By: 07/13/2019 M.D.   On: 05/13/2019 20:19   07/13/2019 Venous Img Lower Unilateral Right (DVT)  Result Date: 05/13/2019 CLINICAL DATA:  Right thigh region pain EXAM: RIGHT LOWER EXTREMITY VENOUS DUPLEX ULTRASOUND TECHNIQUE: Gray-scale sonography with graded compression, as well as color Doppler and duplex ultrasound were performed to evaluate the right lower extremity deep venous system from the level of the common femoral vein and including the common femoral, femoral, profunda femoral, popliteal and calf veins including the posterior tibial, peroneal and gastrocnemius veins when visible. The superficial great saphenous vein was also interrogated. Spectral Doppler was utilized to evaluate flow at rest and with distal augmentation maneuvers in the common femoral, femoral and popliteal veins. COMPARISON:   None. FINDINGS: Contralateral Common Femoral Vein: Respiratory phasicity is normal and symmetric with the symptomatic side. No evidence of thrombus. Normal compressibility. Common Femoral Vein: No evidence of thrombus. Normal compressibility, respiratory phasicity and response to augmentation. Saphenofemoral Junction: No evidence of thrombus. Normal compressibility and flow on color Doppler imaging. Profunda Femoral Vein: No evidence of thrombus. Normal compressibility and flow on color Doppler imaging. Femoral Vein: No evidence of thrombus. Normal compressibility, respiratory phasicity and response to augmentation. Popliteal Vein: No evidence of thrombus. Normal compressibility, respiratory phasicity and response to augmentation. Calf Veins: No evidence of thrombus. Normal compressibility and flow on color Doppler imaging. Superficial Great Saphenous Vein: No evidence of thrombus. Normal compressibility. Venous Reflux:  None. Other Findings:  None. IMPRESSION: No evidence of deep venous thrombosis in the right lower extremity. Left common femoral vein also patent. Electronically Signed   By: Bretta Bang III M.D.   On: 05/13/2019 12:24   DG HIP UNILAT WITH PELVIS 1V RIGHT  Result Date: 05/13/2019 CLINICAL DATA:  Right hip pain EXAM: DG HIP (WITH OR WITHOUT PELVIS) 1V RIGHT COMPARISON:  None. FINDINGS: There is no evidence of hip fracture or dislocation. There is no evidence of arthropathy or other focal bone abnormality. Hip joints and SI joints are symmetric and unremarkable. IMPRESSION: Negative. Electronically Signed   By: Charlett Nose M.D.   On: 05/13/2019 20:20     Assessment & Plan:  Plan  I have discontinued Diamond D. Tumminello's valsartan. I am also having him start on valsartan. Additionally, I am having him maintain his Turmeric, calcium-vitamin D, Glucosamine-Chondroitin-MSM (TRIPLE FLEX PO), sildenafil, Vitamin D (Ergocalciferol), NONFORMULARY OR COMPOUNDED ITEM, meloxicam, fenofibrate,  metFORMIN, metoprolol succinate, potassium chloride SA, chlorthalidone, atorvastatin, and Ozempic (0.25 or 0.5 MG/DOSE).  Meds ordered this encounter  Medications   valsartan (DIOVAN) 160 MG tablet    Sig: Take 1 tablet (160 mg total) by mouth daily.    Dispense:  90 tablet    Refill:  2   fenofibrate 160 MG tablet    Sig: Take 1 tablet (160 mg total) by mouth daily.    Dispense:  90 tablet    Refill:  3   metFORMIN (GLUCOPHAGE) 500 MG tablet    Sig: Take 1 tablet (500 mg total) by mouth 2 (two) times daily with a meal.    Dispense:  180 tablet    Refill:  1   metoprolol succinate (TOPROL-XL) 100 MG 24 hr tablet    Sig: 1 1/2 po qd    Dispense:  135 tablet    Refill:  3    Cancel metoprolol 50 mg   potassium chloride SA (KLOR-CON) 20 MEQ tablet    Sig: Take 1 tablet (20 mEq total) by mouth daily.    Dispense:  90 tablet    Refill:  1   chlorthalidone (HYGROTON) 25 MG tablet    Sig: Take 1 tablet (25 mg total) by mouth daily.    Dispense:  90 tablet    Refill:  1   atorvastatin (LIPITOR) 40 MG tablet    Sig: Take 1 tablet (40 mg total) by mouth daily.    Dispense:  90 tablet    Refill:  3   Semaglutide,0.25 or 0.5MG /DOS, (OZEMPIC, 0.25 OR 0.5 MG/DOSE,) 2 MG/1.5ML SOPN    Sig: Inject 0.25 mg into the skin once a week.    Dispense:  1.5 mL    Refill:  1    Problem List Items Addressed This Visit      Unprioritized   Class 3 severe obesity with serious comorbidity and body mass index (BMI) of 50.0 to 59.9 in adult [facility])    Pt will have new ins in a few months He may be able to get ozempic at that time      Relevant Medications   metFORMIN (GLUCOPHAGE) 500 MG tablet   Semaglutide,0.25 or 0.5MG /DOS, (OZEMPIC, 0.25 OR 0.5 MG/DOSE,) 2 MG/1.5ML SOPN   HTN (hypertension) - Primary    Poorly controlled will alter medications, encouraged DASH diet, minimize caffeine and obtain adequate sleep. Report concerning symptoms and follow up as directed and as needed con't ,  metoprolol and chlorthalidone inc diovan  Relevant Medications   valsartan (DIOVAN) 160 MG tablet   fenofibrate 160 MG tablet   metoprolol succinate (TOPROL-XL) 100 MG 24 hr tablet   chlorthalidone (HYGROTON) 25 MG tablet   atorvastatin (LIPITOR) 40 MG tablet   Hyperlipidemia    Tolerating statin, encouraged heart healthy diet, avoid trans fats, minimize simple carbs and saturated fats. Increase exercise as tolerated con't fenofibrate, con't lipitor        Relevant Medications   valsartan (DIOVAN) 160 MG tablet   fenofibrate 160 MG tablet   metoprolol succinate (TOPROL-XL) 100 MG 24 hr tablet   chlorthalidone (HYGROTON) 25 MG tablet   atorvastatin (LIPITOR) 40 MG tablet   Hypokalemia   Relevant Medications   potassium chloride SA (KLOR-CON) 20 MEQ tablet   Prediabetes   Relevant Medications   metFORMIN (GLUCOPHAGE) 500 MG tablet   Semaglutide,0.25 or 0.5MG /DOS, (OZEMPIC, 0.25 OR 0.5 MG/DOSE,) 2 MG/1.5ML SOPN    Other Visit Diagnoses    Essential hypertension       Relevant Medications   valsartan (DIOVAN) 160 MG tablet   fenofibrate 160 MG tablet   metoprolol succinate (TOPROL-XL) 100 MG 24 hr tablet   chlorthalidone (HYGROTON) 25 MG tablet   atorvastatin (LIPITOR) 40 MG tablet      Follow-up: Return in about 3 months (around 03/24/2020), or if symptoms worsen or fail to improve, for hypertension, hyperlipidemia, diabetes II.  Donato Schultz, DO

## 2019-12-24 NOTE — Assessment & Plan Note (Signed)
Tolerating statin, encouraged heart healthy diet, avoid trans fats, minimize simple carbs and saturated fats. Increase exercise as tolerated con't fenofibrate, con't lipitor

## 2019-12-24 NOTE — Assessment & Plan Note (Addendum)
Poorly controlled will alter medications, encouraged DASH diet, minimize caffeine and obtain adequate sleep. Report concerning symptoms and follow up as directed and as needed con't , metoprolol and chlorthalidone inc diovan

## 2019-12-24 NOTE — Assessment & Plan Note (Signed)
Pt will have new ins in a few months He may be able to get ozempic at that time

## 2019-12-25 ENCOUNTER — Telehealth: Payer: Self-pay | Admitting: Family Medicine

## 2019-12-25 NOTE — Telephone Encounter (Signed)
Patient dropped off form to be signed by [facility] for work!   I put papers into [place] bin up front

## 2019-12-26 NOTE — Telephone Encounter (Signed)
Recieved

## 2019-12-29 NOTE — Telephone Encounter (Signed)
Patient checking the status of form

## 2019-12-30 NOTE — Telephone Encounter (Signed)
Form ready for pick up. Pt notified and will pick up tomorrow

## 2020-01-05 ENCOUNTER — Encounter: Payer: Self-pay | Admitting: Family Medicine

## 2020-01-06 ENCOUNTER — Ambulatory Visit: Payer: 59 | Admitting: Family Medicine

## 2020-01-06 ENCOUNTER — Other Ambulatory Visit: Payer: Self-pay

## 2020-01-06 ENCOUNTER — Encounter: Payer: Self-pay | Admitting: Family Medicine

## 2020-01-06 DIAGNOSIS — M25551 Pain in right hip: Secondary | ICD-10-CM

## 2020-01-06 MED ORDER — NABUMETONE 750 MG PO TABS: 750.0000 mg | ORAL_TABLET | Freq: Two times a day (BID) | ORAL | 6 refills | Status: DC | PRN

## 2020-01-06 NOTE — Progress Notes (Signed)
Office Visit Note   Patient: Sean [name]           Date of Birth: October 10, 1975           MRN: [id number] Visit Date: 01/06/2020 Requested by: [address] [place], [place] [address],  [place] [address] PCP: Zola Button, Grayling Congress, DO  Subjective: Chief Complaint  Patient presents with  . Right Hip - Pain, Follow-up    S/p intra-articular cortisone injection 12/02/19. Relieved pain for a couple days - had more mobility. After a week, the pain returned. While lying in bed, the right leg feels heavy with some tingling. Still has pain with bending over- "takes my breath."    HPI: He is here for follow-up right hip pain.  Intra-articular injection took the edge off his pain for a couple days, but he did not feel like it gave him complete relief, he was not convinced that it was the source of his pain.  He still has pain with flexion of the hip and certain twisting movements.  He just recently quit his job and has another job starting in January, so he would like to avoid imaging studies at this point.  Meloxicam does not seem to be helping much.                ROS:   All other systems were reviewed and are negative.  Objective: Vital Signs: There were no vitals taken for this visit.  Physical Exam:  General:  Alert and oriented, in no acute distress. Pulm:  Breathing unlabored. Psy:  Normal mood, congruent affect.  Right hip: He still has pain with passive flexion of his hip.  A little bit of pain with internal rotation, a little bit more pain with external rotation.   Imaging: No results found.  Assessment & Plan: 1.  Ongoing right hip pain, etiology uncertain.  Could be related to early DJD, cannot rule out labrum tear. -We will try Relafen as needed.  Home stretching and strengthening exercises given today.  If his pain persist, he will contact me and I will order MRI scan.     Procedures: No procedures performed  No notes on file     PMFS  History: Patient Active Problem List   Diagnosis Date Noted  . Right thigh pain 05/13/2019  . Uncontrolled type 2 diabetes mellitus with hyperglycemia (HCC) 04/10/2019  . Hyperlipidemia associated with type 2 diabetes mellitus (HCC) 04/10/2019  . Cutaneous skin tags 04/10/2019  . Class 3 severe obesity with serious comorbidity and body mass index (BMI) of 50.0 to 59.9 in adult (HCC) 03/18/2018  . Cellulitis of left lower extremity 09/07/2017  . History of cellulitis 08/19/2017  . Hypokalemia 08/19/2017  . Lower extremity edema 08/19/2017  . Depression 02/15/2017  . Prediabetes 09/19/2016  . Vitamin D deficiency 09/19/2016  . Other fatigue 09/05/2016  . Dyspnea on exertion 09/05/2016  . Snoring 09/05/2016  . Morbid obesity (HCC) 09/05/2016  . Patellofemoral arthritis of right knee 08/11/2016  . Severe obesity (BMI >= 40) (HCC) 05/09/2015  . Hyperlipidemia 02/19/2014  . Sinusitis, acute maxillary 02/09/2014  . Infected laceration 02/09/2014  . HTN (hypertension) 03/13/2011   Past Medical History:  Diagnosis Date  . Heartburn   . Hyperlipidemia   . Hypertension   . Sleep apnea   . SOB (shortness of breath) on exertion   . Swelling    feet and legs    Family History  Problem  Relation Age of Onset  . Heart disease Father 73       MI  . Hypertension Father   . Sudden death Father        Heart disease  . Hyperlipidemia Father   . Obesity Father   . Diabetes Mother        Borderline  . Hyperlipidemia Mother   . Hypertension Mother   . Depression Mother   . Obesity Mother   . Breast cancer Maternal Grandmother     No past surgical history on file. Social History   Occupational History  . Occupation: Dietitian  Tobacco Use  . Smoking status: Never Smoker  . Smokeless tobacco: Never Used  Substance and Sexual Activity  . Alcohol use: Yes  . Drug use: No  . Sexual activity: Yes

## 2020-01-27 ENCOUNTER — Other Ambulatory Visit (HOSPITAL_BASED_OUTPATIENT_CLINIC_OR_DEPARTMENT_OTHER): Payer: Self-pay | Admitting: Internal Medicine

## 2020-01-27 ENCOUNTER — Ambulatory Visit: Payer: Self-pay | Attending: Internal Medicine

## 2020-01-27 DIAGNOSIS — Z23 Encounter for immunization: Secondary | ICD-10-CM

## 2020-01-27 NOTE — Progress Notes (Signed)
° °  Covid-19 Vaccination Clinic  Name:  Sean [name]    MRN: [id number] DOB: 1975/08/12  01/27/2020  [name] was observed post Covid-19 immunization for 15 minutes without incident. He was provided with Vaccine Information Sheet and instruction to access the V-Safe system.   [name] was instructed to call 911 with any severe reactions post vaccine:  Difficulty breathing   Swelling of face and throat   A fast heartbeat   A bad rash all over body   Dizziness and weakness   Immunizations Administered    Name Date Dose VIS Date Route   Moderna Covid-19 Booster Vaccine 01/27/2020  9:57 AM 0.25 mL 11/26/2019 Intramuscular   Manufacturer: Gala Murdoch   Lot: [id number]   NDC: 49826-415-83

## 2020-01-29 MED FILL — MODERNA COVID-19 VACCINE 10: 100 | 28 days supply | Qty: 0 | Fill #0

## 2020-02-24 ENCOUNTER — Telehealth: Payer: Self-pay

## 2020-02-24 DIAGNOSIS — M25551 Pain in right hip: Secondary | ICD-10-CM

## 2020-02-24 NOTE — Telephone Encounter (Signed)
MRI

## 2020-02-24 NOTE — Telephone Encounter (Signed)
Patient called he is requesting a referral to be sent to [facility] for his hip ASAP [phone]

## 2020-02-24 NOTE — Telephone Encounter (Signed)
Left voice mail advising the patient the order has been placed.

## 2020-02-24 NOTE — Addendum Note (Signed)
Addended by: Lillia Carmel on: 02/24/2020 04:09 PM   Modules accepted: Orders

## 2020-02-24 NOTE — Telephone Encounter (Signed)
Ordered

## 2020-03-25 ENCOUNTER — Other Ambulatory Visit: Payer: Self-pay

## 2020-03-25 ENCOUNTER — Ambulatory Visit (INDEPENDENT_AMBULATORY_CARE_PROVIDER_SITE_OTHER): Payer: 59 | Admitting: Family Medicine

## 2020-03-25 ENCOUNTER — Encounter: Payer: Self-pay | Admitting: Family Medicine

## 2020-03-25 ENCOUNTER — Ambulatory Visit: Admission: RE | Admit: 2020-03-25 | Payer: 59 | Source: Ambulatory Visit

## 2020-03-25 ENCOUNTER — Ambulatory Visit
Admission: RE | Admit: 2020-03-25 | Discharge: 2020-03-25 | Disposition: A | Discharge status: Home / Self Care | Payer: 59 | Source: Ambulatory Visit | Attending: Family Medicine | Admitting: Family Medicine

## 2020-03-25 ENCOUNTER — Telehealth: Payer: Self-pay | Admitting: Family Medicine

## 2020-03-25 VITALS — BP 134/82 | HR 77 | Temp 98.4°F | Resp 18 | Wt >= 6400 oz

## 2020-03-25 DIAGNOSIS — M25551 Pain in right hip: Secondary | ICD-10-CM

## 2020-03-25 DIAGNOSIS — E785 Hyperlipidemia, unspecified: Secondary | ICD-10-CM

## 2020-03-25 DIAGNOSIS — E1169 Type 2 diabetes mellitus with other specified complication: Secondary | ICD-10-CM | POA: Diagnosis not present

## 2020-03-25 DIAGNOSIS — Z1159 Encounter for screening for other viral diseases: Secondary | ICD-10-CM

## 2020-03-25 DIAGNOSIS — I10 Essential (primary) hypertension: Secondary | ICD-10-CM

## 2020-03-25 DIAGNOSIS — E1165 Type 2 diabetes mellitus with hyperglycemia: Secondary | ICD-10-CM | POA: Diagnosis not present

## 2020-03-25 DIAGNOSIS — Z23 Encounter for immunization: Secondary | ICD-10-CM | POA: Diagnosis not present

## 2020-03-25 DIAGNOSIS — Z1211 Encounter for screening for malignant neoplasm of colon: Secondary | ICD-10-CM

## 2020-03-25 LAB — COMPREHENSIVE METABOLIC PANEL
ALT: 33 U/L (ref 0–53)
AST: 21 U/L (ref 0–37)
Albumin: 4.4 g/dL (ref 3.5–5.2)
Alkaline Phosphatase: 59 U/L (ref 39–117)
BUN: 17 mg/dL (ref 6–23)
CO2: 33 mEq/L — ABNORMAL HIGH (ref 19–32)
Calcium: 9.9 mg/dL (ref 8.4–10.5)
Chloride: 101 mEq/L (ref 96–112)
Creatinine, Ser: 1.18 mg/dL (ref 0.40–1.50)
GFR: 74.66 mL/min (ref >60.00)
Glucose, Bld: 85 mg/dL (ref 70–99)
Potassium: 3.7 mEq/L (ref 3.5–5.1)
Sodium: 140 mEq/L (ref 135–145)
Total Bilirubin: 0.7 mg/dL (ref 0.2–1.2)
Total Protein: 7 g/dL (ref 6.0–8.3)

## 2020-03-25 LAB — LIPID PANEL
Cholesterol: 159 mg/dL (ref 0–200)
HDL: 38.7 mg/dL — ABNORMAL LOW (ref >39.00)
LDL Cholesterol: 86 mg/dL (ref 0–99)
NonHDL: 120.43
Total CHOL/HDL Ratio: 4
Triglycerides: 170 mg/dL — ABNORMAL HIGH (ref 0.0–149.0)
VLDL: 34 mg/dL (ref 0.0–40.0)

## 2020-03-25 LAB — MICROALBUMIN / CREATININE URINE RATIO
Creatinine,U: 139.3 mg/dL
Microalb Creat Ratio: 0.5 mg/g (ref 0.0–30.0)
Microalb, Ur: 0.7 mg/dL (ref 0.0–1.9)

## 2020-03-25 LAB — VITAMIN D 25 HYDROXY (VIT D DEFICIENCY, FRACTURES): VITD: 29.29 ng/mL — ABNORMAL LOW (ref 30.00–100.00)

## 2020-03-25 LAB — HEMOGLOBIN A1C: Hgb A1c MFr Bld: 5.7 % (ref 4.6–6.5)

## 2020-03-25 MED ORDER — BLOOD GLUCOSE MONITOR KIT: PACK | 0 refills | Status: DC

## 2020-03-25 NOTE — Telephone Encounter (Signed)
Patient called. Says he was not able to do the MRI do to your weight. Says they could not do the dye in his back. Would like to know what to do next. His call back number is [phone]

## 2020-03-25 NOTE — Assessment & Plan Note (Signed)
Well controlled, no changes to meds. Encouraged heart healthy diet such as the DASH diet and exercise as tolerated.  °

## 2020-03-25 NOTE — Assessment & Plan Note (Signed)
hgba1c to be checked, minimize simple carbs. Increase exercise as tolerated. Continue current meds  

## 2020-03-25 NOTE — Patient Instructions (Signed)

## 2020-03-25 NOTE — Progress Notes (Signed)
Patient ID: Sean [name], male    DOB: 1975/09/14  Age: 45 y.o. MRN: [id number]    Subjective:  Subjective  HPI Sean [name] presents for f/u bp , chol and dm  HYPERTENSION   Blood pressure range-not checking   Chest pain- no      Dyspnea- no Lightheadedness- no   Edema- no  Other side effects - no   Medication compliance: good Low salt diet- yes    DIABETES    Blood Sugar ranges-not checking   Polyuria- no New Visual problems- no  Hypoglycemic symptoms- no  Other side effects-no Medication compliance - good Last eye exam- due Foot exam- today   HYPERLIPIDEMIA  Medication compliance- good RUQ pain- no  Muscle aches- no Other side effects-no       Review of Systems  Constitutional: Negative for appetite change, diaphoresis, fatigue and unexpected weight change.  Eyes: Negative for pain, redness and visual disturbance.  Respiratory: Negative for cough, chest tightness, shortness of breath and wheezing.   Cardiovascular: Negative for chest pain, palpitations and leg swelling.  Endocrine: Negative for cold intolerance, heat intolerance, polydipsia, polyphagia and polyuria.  Genitourinary: Negative for difficulty urinating, dysuria and frequency.  Neurological: Negative for dizziness, light-headedness, numbness and headaches.    History Past Medical History:  Diagnosis Date  . Heartburn   . Hyperlipidemia   . Hypertension   . Sleep apnea   . SOB (shortness of breath) on exertion   . Swelling    feet and legs    He has no past surgical history on file.   His family history includes Breast cancer in his maternal grandmother; Depression in his mother; Diabetes in his mother; Heart disease (age of onset: 62) in his father; Hyperlipidemia in his father and mother; Hypertension in his father and mother; Obesity in his father and mother; Sudden death in his father.He reports that he has never smoked. He has never used smokeless tobacco. He reports current alcohol  use. He reports that he does not use drugs.  Current Outpatient Medications on File Prior to Visit  Medication Sig Dispense Refill  . Ascorbic Acid (VITAMIN C) 1000 MG tablet Take 1,000 mg by mouth daily.    Marland Kitchen atorvastatin (LIPITOR) 40 MG tablet Take 1 tablet (40 mg total) by mouth daily. 90 tablet 3  . chlorthalidone (HYGROTON) 25 MG tablet Take 1 tablet (25 mg total) by mouth daily. 90 tablet 1  . cyanocobalamin 1000 MCG tablet Take 1,000 mcg by mouth daily.    . fenofibrate 160 MG tablet Take 1 tablet (160 mg total) by mouth daily. 90 tablet 3  . metFORMIN (GLUCOPHAGE) 500 MG tablet Take 1 tablet (500 mg total) by mouth 2 (two) times daily with a meal. 180 tablet 1  . metoprolol succinate (TOPROL-XL) 100 MG 24 hr tablet 1 1/2 po qd 135 tablet 3  . Multiple Vitamin (MULTIVITAMIN) capsule Take 1 capsule by mouth daily.    . nabumetone (RELAFEN) 750 MG tablet Take 1 tablet (750 mg total) by mouth 2 (two) times daily as needed. 60 tablet 6  . NONFORMULARY OR COMPOUNDED ITEM Compression socks #1  As directed   Dx low ext edema 1 each 0  . valsartan (DIOVAN) 160 MG tablet Take 1 tablet (160 mg total) by mouth daily. 90 tablet 2   No current facility-administered medications on file prior to visit.     Objective:  Objective  Physical Exam Vitals and nursing note reviewed.  Constitutional:  General: He is sleeping. Vital signs are normal.     Appearance: He is well-developed and well-nourished.  HENT:     Head: Normocephalic and atraumatic.     Mouth/Throat:     Mouth: Oropharynx is clear and moist.  Eyes:     Extraocular Movements: EOM normal.     Pupils: Pupils are equal, round, and reactive to light.  Neck:     Thyroid: No thyromegaly.  Cardiovascular:     Rate and Rhythm: Normal rate and regular rhythm.     Heart sounds: No murmur heard.   Pulmonary:     Effort: Pulmonary effort is normal. No respiratory distress.     Breath sounds: Normal breath sounds. No wheezing or  rales.  Chest:     Chest wall: No tenderness.  Musculoskeletal:        General: No tenderness or edema.     Cervical back: Normal range of motion and neck supple.  Skin:    General: Skin is warm and dry.  Neurological:     Mental Status: He is oriented to person, place, and time.  Psychiatric:        Mood and Affect: Mood and affect normal.        Behavior: Behavior normal.        Thought Content: Thought content normal.        Judgment: Judgment normal.    Diabetic Foot Exam - Simple   Simple Foot Form Diabetic Foot exam was performed with the following findings: Yes 03/25/2020 12:01 PM  Visual Inspection No deformities, no ulcerations, no other skin breakdown bilaterally: Yes Sensation Testing Intact to touch and monofilament testing bilaterally: Yes Pulse Check Posterior Tibialis and Dorsalis pulse intact bilaterally: Yes Comments     BP 134/82 (BP Location: Left Arm, Patient Position: Sitting, Cuff Size: Large)   Pulse 77   Temp 98.4 F (36.9 C) (Oral)   Resp 18   Wt (!) 428 lb 6.4 oz (194.3 kg)   SpO2 98%   BMI 56.52 kg/m  Wt Readings from Last 3 Encounters:  03/25/20 (!) 428 lb 6.4 oz (194.3 kg)  12/23/19 (!) 422 lb 9.6 oz (191.7 kg)  11/25/19 (!) 421 lb 9.6 oz (191.2 kg)     Lab Results  Component Value Date   WBC 11.6 (H) 07/22/2019   HGB 14.6 07/22/2019   HCT 42.6 07/22/2019   PLT 212.0 07/22/2019   GLUCOSE 92 11/25/2019   CHOL 159 11/25/2019   TRIG 120 11/25/2019   HDL 38 (L) 11/25/2019   LDLDIRECT 107.0 08/05/2019   LDLCALC 99 11/25/2019   ALT 34 11/25/2019   AST 23 11/25/2019   NA 139 11/25/2019   K 3.6 11/25/2019   CL 103 11/25/2019   CREATININE 0.98 11/25/2019   BUN 15 11/25/2019   CO2 27 11/25/2019   TSH 2.740 03/14/2018   HGBA1C 5.7 (H) 11/25/2019   MICROALBUR 0.8 11/25/2019    DG Lumbar Spine Complete  Result Date: 05/13/2019 CLINICAL DATA:  Right groin/leg pain EXAM: LUMBAR SPINE - COMPLETE 4+ VIEW COMPARISON:  None. FINDINGS:  Mild degenerative facet disease throughout the lumbar spine, most pronounced at the lower lumbar spine. Disc spaces maintained. Normal alignment. Mild anterior spurring in the lower thoracic and upper lumbar spine. No fracture. SI joints symmetric and unremarkable. IMPRESSION: Early degenerative disc and mild degenerative facet disease as above. No acute bony abnormality. Electronically Signed   By: Rolm Baptise M.D.   On: 05/13/2019 20:19  US Venous Img Lower Unilateral Right (DVT)  Result Date: 05/13/2019 CLINICAL DATA:  Right thigh region pain EXAM: RIGHT LOWER EXTREMITY VENOUS DUPLEX ULTRASOUND TECHNIQUE: Gray-scale sonography with graded compression, as well as color Doppler and duplex ultrasound were performed to evaluate the right lower extremity deep venous system from the level of the common femoral vein and including the common femoral, femoral, profunda femoral, popliteal and calf veins including the posterior tibial, peroneal and gastrocnemius veins when visible. The superficial great saphenous vein was also interrogated. Spectral Doppler was utilized to evaluate flow at rest and with distal augmentation maneuvers in the common femoral, femoral and popliteal veins. COMPARISON:  None. FINDINGS: Contralateral Common Femoral Vein: Respiratory phasicity is normal and symmetric with the symptomatic side. No evidence of thrombus. Normal compressibility. Common Femoral Vein: No evidence of thrombus. Normal compressibility, respiratory phasicity and response to augmentation. Saphenofemoral Junction: No evidence of thrombus. Normal compressibility and flow on color Doppler imaging. Profunda Femoral Vein: No evidence of thrombus. Normal compressibility and flow on color Doppler imaging. Femoral Vein: No evidence of thrombus. Normal compressibility, respiratory phasicity and response to augmentation. Popliteal Vein: No evidence of thrombus. Normal compressibility, respiratory phasicity and response to  augmentation. Calf Veins: No evidence of thrombus. Normal compressibility and flow on color Doppler imaging. Superficial Great Saphenous Vein: No evidence of thrombus. Normal compressibility. Venous Reflux:  None. Other Findings:  None. IMPRESSION: No evidence of deep venous thrombosis in the right lower extremity. Left common femoral vein also patent. Electronically Signed   By: Lowella Grip III M.D.   On: 05/13/2019 12:24   DG HIP UNILAT WITH PELVIS 1V RIGHT  Result Date: 05/13/2019 CLINICAL DATA:  Right hip pain EXAM: DG HIP (WITH OR WITHOUT PELVIS) 1V RIGHT COMPARISON:  None. FINDINGS: There is no evidence of hip fracture or dislocation. There is no evidence of arthropathy or other focal bone abnormality. Hip joints and SI joints are symmetric and unremarkable. IMPRESSION: Negative. Electronically Signed   By: Rolm Baptise M.D.   On: 05/13/2019 20:20     Assessment & Plan:  Plan  I have discontinued Sonia D. Harig's Turmeric, calcium-vitamin D, Glucosamine-Chondroitin-MSM (TRIPLE FLEX PO), sildenafil, Vitamin D (Ergocalciferol), potassium chloride SA, and Ozempic (0.25 or 0.5 MG/DOSE). I am also having him start on blood glucose meter kit and supplies. Additionally, I am having him maintain his NONFORMULARY OR COMPOUNDED ITEM, valsartan, fenofibrate, metFORMIN, metoprolol succinate, chlorthalidone, atorvastatin, nabumetone, cyanocobalamin, multivitamin, and vitamin C.  Meds ordered this encounter  Medications  . blood glucose meter kit and supplies KIT    Sig: Dispense based on patient and insurance preference. Use up to four times daily as directed. (FOR ICD-9 250.00, 250.01).    Dispense:  1 each    Refill:  0    Order Specific Question:   Number of strips    Answer:   100    Order Specific Question:   Number of lancets    Answer:   100    Problem List Items Addressed This Visit      Unprioritized   HTN (hypertension) - Primary    Well controlled, no changes to meds. Encouraged  heart healthy diet such as the DASH diet and exercise as tolerated.        Relevant Orders   Lipid panel   Hemoglobin A1c   Comprehensive metabolic panel   Microalbumin / creatinine urine ratio   Hyperlipidemia    Tolerating statin, encouraged heart healthy diet, avoid trans fats, minimize simple  carbs and saturated fats. Increase exercise as tolerated      Hyperlipidemia associated with type 2 diabetes mellitus ([place])    Tolerating statin, encouraged heart healthy diet, avoid trans fats, minimize simple carbs and saturated fats. Increase exercise as tolerated      Relevant Orders   Lipid panel   Hemoglobin A1c   Comprehensive metabolic panel   Microalbumin / creatinine urine ratio   Morbid obesity (HCC)   Relevant Orders   Amb Ref to Medical Weight Management   Insulin, random   Vitamin D (25 hydroxy)   Uncontrolled type 2 diabetes mellitus with hyperglycemia ([place])    hgba1c to be checked ,   minimize simple carbs. Increase exercise as tolerated. Continue current meds        Other Visit Diagnoses    Type 2 diabetes mellitus with hyperglycemia, without long-term current use of insulin (HCC)       Relevant Medications   blood glucose meter kit and supplies KIT   Need for hepatitis C screening test       Relevant Orders   Hepatitis C antibody   Colon cancer screening       Relevant Orders   Ambulatory referral to Gastroenterology   Need for pneumococcal vaccination       Relevant Orders   Pneumococcal polysaccharide vaccine 23-valent greater than or equal to 2yo subcutaneous/IM (Completed)      Follow-up: Return in about 3 months (around 06/22/2020), or if symptoms worsen or fail to improve, for hypertension, hyperlipidemia, diabetes II.  Ann Held, DO

## 2020-03-25 NOTE — Telephone Encounter (Signed)
Hold for Sean [name] 

## 2020-03-25 NOTE — Assessment & Plan Note (Signed)
Tolerating statin, encouraged heart healthy diet, avoid trans fats, minimize simple carbs and saturated fats. Increase exercise as tolerated 

## 2020-03-26 ENCOUNTER — Telehealth: Payer: Self-pay

## 2020-03-26 LAB — HEPATITIS C ANTIBODY
Hepatitis C Ab: NONREACTIVE
SIGNAL TO CUT-OFF: 0.01 (ref <1.00)

## 2020-03-26 LAB — INSULIN, RANDOM: Insulin: 66.9 u[IU]/mL — ABNORMAL HIGH

## 2020-03-26 MED ORDER — TRAMADOL HCL 50 MG PO TABS: 50.0000 mg | ORAL_TABLET | Freq: Four times a day (QID) | ORAL | 0 refills | Status: DC | PRN

## 2020-03-26 NOTE — Addendum Note (Signed)
Addended by: Lillia Carmel on: 03/26/2020 03:02 PM   Modules accepted: Orders

## 2020-03-26 NOTE — Telephone Encounter (Signed)
Please advise 

## 2020-03-26 NOTE — Telephone Encounter (Signed)
Tramadol sent. 

## 2020-03-26 NOTE — Telephone Encounter (Signed)
I have spoken with the patient and his brother Sean [name] about this. Checking with our referral coordinator to see if she can help with options.

## 2020-03-26 NOTE — Telephone Encounter (Signed)
Patient called he is requesting rx to be sent in for pain medication until he receives his mri call back:[phone]

## 2020-03-26 NOTE — Telephone Encounter (Signed)
I will order an arthrogram of his hip.  It's not as good by itself as with combining it with MRI, but it may give [name] the information we need.

## 2020-03-26 NOTE — Telephone Encounter (Signed)
I called and advised the patient of the new location for the MRI w/contrast ---- sending to [facility] on [address] ([facility]). They will be calling the patient to set this up. Our referral coordinator will work on getting prior authorization.

## 2020-03-26 NOTE — Telephone Encounter (Signed)
I called and advised the patient this was sent in. He said he mainly just needs something at night. He has antiinflammatories to take during the day.

## 2020-03-29 ENCOUNTER — Other Ambulatory Visit: Payer: Self-pay | Admitting: Family Medicine

## 2020-03-29 DIAGNOSIS — E559 Vitamin D deficiency, unspecified: Secondary | ICD-10-CM

## 2020-03-29 DIAGNOSIS — E8881 Metabolic syndrome: Secondary | ICD-10-CM

## 2020-03-31 ENCOUNTER — Other Ambulatory Visit: Payer: Self-pay

## 2020-03-31 DIAGNOSIS — I10 Essential (primary) hypertension: Secondary | ICD-10-CM

## 2020-03-31 DIAGNOSIS — E559 Vitamin D deficiency, unspecified: Secondary | ICD-10-CM

## 2020-03-31 MED ORDER — RYBELSUS 3 MG PO TABS: 1.0000 | ORAL_TABLET | Freq: Every day | ORAL | 0 refills | Status: DC

## 2020-03-31 MED ORDER — VITAMIN D (ERGOCALCIFEROL) 1.25 MG (50000 UNIT) PO CAPS: 50000.0000 [IU] | ORAL_CAPSULE | ORAL | 1 refills | Status: DC

## 2020-03-31 MED ORDER — CHLORTHALIDONE 25 MG PO TABS: 25.0000 mg | ORAL_TABLET | Freq: Every day | ORAL | 1 refills | Status: DC

## 2020-04-08 ENCOUNTER — Telehealth: Payer: Self-pay | Admitting: Family Medicine

## 2020-04-08 NOTE — Telephone Encounter (Signed)
Please see results on your desk.  I called the patient to let him know the MRI results were faxed just this afternoon, but Dr. Prince Rome is not in the office on Thursday afternoons and therefore has not seen these results. The patient was not given a cd.  Advised the patient that it would be tomorrow when he hears back from [name] about the results and plan (phone call or MyChart message). The patient said he has been in contact with his boss and plans to go back to work on 04/12/20.

## 2020-04-08 NOTE — Telephone Encounter (Signed)
Pt called and would like to know the results of his MRI.  CB [phone]

## 2020-04-09 MED ORDER — DICLOFENAC SODIUM 75 MG PO TBEC: 75.0000 mg | DELAYED_RELEASE_TABLET | Freq: Two times a day (BID) | ORAL | 3 refills | Status: DC | PRN

## 2020-04-09 NOTE — Telephone Encounter (Signed)
My Chart message sent

## 2020-04-09 NOTE — Telephone Encounter (Signed)
MRI shows moderate arthritis in the hip as well as a labrum cartilage tear.  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

## 2020-04-09 NOTE — Addendum Note (Signed)
Addended by: Lillia Carmel on: 04/09/2020 08:09 AM   Modules accepted: Orders

## 2020-06-24 ENCOUNTER — Ambulatory Visit: Payer: 59 | Admitting: Family Medicine

## 2020-06-24 NOTE — Progress Notes (Incomplete)
Subjective:   By signing my name below, I, [name], attest that this documentation has been prepared under the direction and in the presence of Dr. Roma Schanz, DO. 06/24/2020      Patient ID: Sean [name], male    DOB: Feb 11, 1975, 45 y.o.   MRN: [id number]  No chief complaint on file.   HPI Patient is in today for a office visit. He complains of He   Past Medical History:  Diagnosis Date  . Heartburn   . Hyperlipidemia   . Hypertension   . Sleep apnea   . SOB (shortness of breath) on exertion   . Swelling    feet and legs    No past surgical history on file.  Family History  Problem Relation Age of Onset  . Heart disease Father 65       MI  . Hypertension Father   . Sudden death Father        Heart disease  . Hyperlipidemia Father   . Obesity Father   . Diabetes Mother        Borderline  . Hyperlipidemia Mother   . Hypertension Mother   . Depression Mother   . Obesity Mother   . Breast cancer Maternal Grandmother     Social History   Socioeconomic History  . Marital status: Single    Spouse name: Not on file  . Number of children: Not on file  . Years of education: Not on file  . Highest education level: Not on file  Occupational History  . Occupation: Chief Executive Officer  Tobacco Use  . Smoking status: Never Smoker  . Smokeless tobacco: Never Used  Substance and Sexual Activity  . Alcohol use: Yes  . Drug use: No  . Sexual activity: Yes  Other Topics Concern  . Not on file  Social History Narrative  . Not on file   Social Determinants of Health   Financial Resource Strain: Not on file  Food Insecurity: Not on file  Transportation Needs: Not on file  Physical Activity: Not on file  Stress: Not on file  Social Connections: Not on file  Intimate Partner Violence: Not on file    Outpatient Medications Prior to Visit  Medication Sig Dispense Refill  . Ascorbic Acid (VITAMIN C) 1000 MG tablet Take 1,000 mg by mouth  daily.    Marland Kitchen atorvastatin (LIPITOR) 40 MG tablet Take 1 tablet (40 mg total) by mouth daily. 90 tablet 3  . blood glucose meter kit and supplies KIT Dispense based on patient and insurance preference. Use up to four times daily as directed. (FOR ICD-9 250.00, 250.01). 1 each 0  . chlorthalidone (HYGROTON) 25 MG tablet Take 1 tablet (25 mg total) by mouth daily. 90 tablet 1  . COVID-19 mRNA vaccine, Moderna, 100 MCG/0.5ML injection INJECT AS DIRECTED .25 mL 0  . cyanocobalamin 1000 MCG tablet Take 1,000 mcg by mouth daily.    . diclofenac (VOLTAREN) 75 MG EC tablet Take 1 tablet (75 mg total) by mouth 2 (two) times daily as needed. 60 tablet 3  . fenofibrate 160 MG tablet Take 1 tablet (160 mg total) by mouth daily. 90 tablet 3  . metFORMIN (GLUCOPHAGE) 500 MG tablet Take 1 tablet (500 mg total) by mouth 2 (two) times daily with a meal. 180 tablet 1  . metoprolol succinate (TOPROL-XL) 100 MG 24 hr tablet 1 1/2 po qd 135 tablet 3  . Multiple Vitamin (MULTIVITAMIN) capsule Take 1 capsule by mouth  daily.    . nabumetone (RELAFEN) 750 MG tablet Take 1 tablet (750 mg total) by mouth 2 (two) times daily as needed. 60 tablet 6  . NONFORMULARY OR COMPOUNDED ITEM Compression socks #1  As directed   Dx low ext edema 1 each 0  . Semaglutide (RYBELSUS) 3 MG TABS Take 1 tablet by mouth daily. 30 tablet 0  . traMADol (ULTRAM) 50 MG tablet Take 1 tablet (50 mg total) by mouth every 6 (six) hours as needed. 20 tablet 0  . valsartan (DIOVAN) 160 MG tablet Take 1 tablet (160 mg total) by mouth daily. 90 tablet 2  . Vitamin D, Ergocalciferol, (DRISDOL) 1.25 MG (50000 UNIT) CAPS capsule Take 1 capsule (50,000 Units total) by mouth every 7 (seven) days. 12 capsule 1   No facility-administered medications prior to visit.    No Known Allergies  ROS     Objective:    Physical Exam Constitutional:      Appearance: Normal appearance.  HENT:     Head: Normocephalic and atraumatic.     Right Ear: External ear  normal.     Left Ear: External ear normal.  Eyes:     Extraocular Movements: Extraocular movements intact.     Pupils: Pupils are equal, round, and reactive to light.  Cardiovascular:     Rate and Rhythm: Normal rate and regular rhythm.     Pulses: Normal pulses.     Heart sounds: Normal heart sounds. No murmur heard. No friction rub. No gallop.   Pulmonary:     Effort: Pulmonary effort is normal. No respiratory distress.     Breath sounds: Normal breath sounds. No wheezing, rhonchi or rales.  Skin:    General: Skin is warm and dry.  Neurological:     Mental Status: He is alert and oriented to person, place, and time.  Psychiatric:        Behavior: Behavior normal.     There were no vitals taken for this visit. Wt Readings from Last 3 Encounters:  03/25/20 (!) 428 lb 6.4 oz (194.3 kg)  12/23/19 (!) 422 lb 9.6 oz (191.7 kg)  11/25/19 (!) 421 lb 9.6 oz (191.2 kg)    Diabetic Foot Exam - Simple   No data filed    Lab Results  Component Value Date   WBC 11.6 (H) 07/22/2019   HGB 14.6 07/22/2019   HCT 42.6 07/22/2019   PLT 212.0 07/22/2019   GLUCOSE 85 03/25/2020   CHOL 159 03/25/2020   TRIG 170.0 (H) 03/25/2020   HDL 38.70 (L) 03/25/2020   LDLDIRECT 107.0 08/05/2019   LDLCALC 86 03/25/2020   ALT 33 03/25/2020   AST 21 03/25/2020   NA 140 03/25/2020   K 3.7 03/25/2020   CL 101 03/25/2020   CREATININE 1.18 03/25/2020   BUN 17 03/25/2020   CO2 33 (H) 03/25/2020   TSH 2.740 03/14/2018   HGBA1C 5.7 03/25/2020   MICROALBUR 0.7 03/25/2020    Lab Results  Component Value Date   TSH 2.740 03/14/2018   Lab Results  Component Value Date   WBC 11.6 (H) 07/22/2019   HGB 14.6 07/22/2019   HCT 42.6 07/22/2019   MCV 85.9 07/22/2019   PLT 212.0 07/22/2019   Lab Results  Component Value Date   NA 140 03/25/2020   K 3.7 03/25/2020   CO2 33 (H) 03/25/2020   GLUCOSE 85 03/25/2020   BUN 17 03/25/2020   CREATININE 1.18 03/25/2020   BILITOT 0.7 03/25/2020  ALKPHOS  59 03/25/2020   AST 21 03/25/2020   ALT 33 03/25/2020   PROT 7.0 03/25/2020   ALBUMIN 4.4 03/25/2020   CALCIUM 9.9 03/25/2020   ANIONGAP 11 08/07/2017   GFR 74.66 03/25/2020   Lab Results  Component Value Date   CHOL 159 03/25/2020   Lab Results  Component Value Date   HDL 38.70 (L) 03/25/2020   Lab Results  Component Value Date   LDLCALC 86 03/25/2020   Lab Results  Component Value Date   TRIG 170.0 (H) 03/25/2020   Lab Results  Component Value Date   CHOLHDL 4 03/25/2020   Lab Results  Component Value Date   HGBA1C 5.7 03/25/2020       Assessment & Plan:   Problem List Items Addressed This Visit   None      No orders of the defined types were placed in this encounter.   I, Dr. Roma Schanz, DO, personally preformed the services described in this documentation.  All medical record entries made by the scribe were at my direction and in my presence.  I have reviewed the chart and discharge instructions (if applicable) and agree that the record reflects my personal performance and is accurate and complete. 06/24/2020   I,[name],acting as a scribe for Ann Held, DO.,have documented all relevant documentation on the behalf of Ann Held, DO,as directed by  Ann Held, DO while in the presence of Ann Held, DO.   Shehryar [employer]

## 2020-07-14 ENCOUNTER — Telehealth: Payer: Self-pay

## 2020-07-14 MED ORDER — CELECOXIB 200 MG PO CAPS: 200.0000 mg | ORAL_CAPSULE | Freq: Two times a day (BID) | ORAL | 6 refills | Status: DC | PRN

## 2020-07-14 NOTE — Telephone Encounter (Signed)
I called and advised him of the celebrex (to take in place of diclofenac).  He would like to try another hip injection to see if if will give him at least some relief, as he starts this new position at work. Ok to schedule?

## 2020-07-14 NOTE — Telephone Encounter (Signed)
Please advise 

## 2020-07-14 NOTE — Addendum Note (Signed)
Addended by: Lillia Carmel on: 07/14/2020 02:01 PM   Modules accepted: Orders

## 2020-07-14 NOTE — Telephone Encounter (Signed)
Patient called he stated he is still having pain in his left hip patient is requesting a call back to discuss other alternatives such as receiving a injection or a rx that can be sent into the pharmacy , patient stated he is starting a new job soon which will require to be on his feet a lot call [phone]

## 2020-07-15 NOTE — Telephone Encounter (Signed)
I called and scheduled the patient for 1:00 on Monday, June 13th with Dr. Prince Rome.

## 2020-07-19 ENCOUNTER — Telehealth: Payer: Self-pay | Admitting: Family Medicine

## 2020-07-19 ENCOUNTER — Encounter: Payer: Self-pay | Admitting: Family Medicine

## 2020-07-19 ENCOUNTER — Ambulatory Visit: Payer: Self-pay

## 2020-07-19 ENCOUNTER — Ambulatory Visit (INDEPENDENT_AMBULATORY_CARE_PROVIDER_SITE_OTHER): Payer: 59 | Admitting: Family Medicine

## 2020-07-19 ENCOUNTER — Other Ambulatory Visit: Payer: Self-pay

## 2020-07-19 DIAGNOSIS — M25551 Pain in right hip: Secondary | ICD-10-CM

## 2020-07-19 NOTE — Progress Notes (Signed)
Office Visit Note   Patient: Sean [name]           Date of Birth: 1975/12/28           MRN: [id number] Visit Date: 07/19/2020 Requested by: Zola Button, [place], [place] [address] [place],  [place] [address] PCP: Zola Button, Grayling Congress, DO  Subjective: Chief Complaint  Patient presents with   Right Hip - Pain    HPI: He is here with persistent right hip pain.  MRI arthrogram showed a labral tear.  He is trying to lose weight.  He would like to try 1 more injection before he starts his new job.              ROS:   All other systems were reviewed and are negative.  Objective: Vital Signs: There were no vitals taken for this visit.  Physical Exam:  General:  Alert and oriented, in no acute distress. Pulm:  Breathing unlabored. Psy:  Normal mood, congruent affect.  Right hip: He has pain with external rotation more than internal.  Imaging: Ultrasound guided injection is preferred based studies that show increased duration, increased effect, greater accuracy, decreased procedural pain, increased response rate, and decreased cost with ultrasound guided versus blind injection.   Verbal informed consent obtained.  Time-out conducted.  Noted no overlying erythema, induration, or other signs of local infection. Ultrasound-guided right hip injection: After sterile prep with Betadine, injected 4 cc 0.25% bupivacaine without epinephrine and 6 mg betamethasone using a 22-gauge spinal needle, passing the needle through the iliofemoral ligament into the femoral head/neck junction.      Assessment & Plan: Right hip labrum tear - Injection given today.  Home exercises and physical therapy.  He will try low carbohydrate diet to lose weight.     Procedures: No procedures performed        PMFS History: Patient Active Problem List   Diagnosis Date Noted   Right thigh pain 05/13/2019   Uncontrolled type 2 diabetes mellitus with hyperglycemia (HCC) 04/10/2019    Hyperlipidemia associated with type 2 diabetes mellitus (HCC) 04/10/2019   Cutaneous skin tags 04/10/2019   Class 3 severe obesity with serious comorbidity and body mass index (BMI) of 50.0 to 59.9 in adult (HCC) 03/18/2018   Cellulitis of left lower extremity 09/07/2017   History of cellulitis 08/19/2017   Hypokalemia 08/19/2017   Lower extremity edema 08/19/2017   Depression 02/15/2017   Prediabetes 09/19/2016   Vitamin D deficiency 09/19/2016   Other fatigue 09/05/2016   Dyspnea on exertion 09/05/2016   Snoring 09/05/2016   Morbid obesity (HCC) 09/05/2016   Patellofemoral arthritis of right knee 08/11/2016   Severe obesity (BMI >= 40) (HCC) 05/09/2015   Hyperlipidemia 02/19/2014   Sinusitis, acute maxillary 02/09/2014   Infected laceration 02/09/2014   HTN (hypertension) 03/13/2011   Past Medical History:  Diagnosis Date   Heartburn    Hyperlipidemia    Hypertension    Sleep apnea    SOB (shortness of breath) on exertion    Swelling    feet and legs    Family History  Problem Relation Age of Onset   Heart disease Father 61       MI   Hypertension Father    Sudden death Father        Heart disease   Hyperlipidemia Father    Obesity Father    Diabetes Mother        Borderline  Hyperlipidemia Mother    Hypertension Mother    Depression Mother    Obesity Mother    Breast cancer Maternal Grandmother     No past surgical history on file. Social History   Occupational History   Occupation: Dietitian  Tobacco Use   Smoking status: Never   Smokeless tobacco: Never  Substance and Sexual Activity   Alcohol use: Yes   Drug use: No   Sexual activity: Yes

## 2020-07-19 NOTE — Telephone Encounter (Signed)
Pt called stating he is having car trouble today and is being towed currently but thinks he will be able to make it to his appt at 1 pm but it unsure. Wanted to see if we could change his appt and work him in later today to make sure he would not be late. Pt sched to have cortisone inj in hip but is in lots of pain which is when offered an appt tomorrow pt asked if we could work him in later today. The best call back number is [phone].

## 2020-07-19 NOTE — Telephone Encounter (Signed)
I called to let the patient know we will work him in when he gets here. He said he now believes he will be able to make the 1:00 appointment.

## 2020-07-19 NOTE — Telephone Encounter (Signed)
Ok to work him in later this afternoon?

## 2020-10-20 ENCOUNTER — Telehealth: Payer: Self-pay | Admitting: Family Medicine

## 2020-10-20 NOTE — Telephone Encounter (Signed)
This patient did not go to PT when it was ordered in June by Dr. Prince Rome. He would now like to do this at the [facility] in [facility]. Can the referral be sent there? He now has [employer].

## 2020-10-20 NOTE — Telephone Encounter (Signed)
Pt called asking for physical therapy and need a call back. Previous Dr. Prince Rome pt and asking for a referral. Please call this pt about this matter. Pt phone number is [phone].

## 2020-10-20 NOTE — Telephone Encounter (Signed)
Order has been chnaged to [facility].

## 2020-11-01 ENCOUNTER — Encounter: Payer: Self-pay | Admitting: Family Medicine

## 2020-11-01 ENCOUNTER — Telehealth (INDEPENDENT_AMBULATORY_CARE_PROVIDER_SITE_OTHER): Payer: Self-pay | Admitting: Family Medicine

## 2020-11-01 ENCOUNTER — Other Ambulatory Visit: Payer: Self-pay

## 2020-11-01 DIAGNOSIS — J40 Bronchitis, not specified as acute or chronic: Secondary | ICD-10-CM

## 2020-11-01 DIAGNOSIS — U071 COVID-19: Secondary | ICD-10-CM

## 2020-11-01 DIAGNOSIS — M25559 Pain in unspecified hip: Secondary | ICD-10-CM | POA: Insufficient documentation

## 2020-11-01 MED ORDER — MOLNUPIRAVIR EUA 200MG CAPSULE: 4.0000 | ORAL_CAPSULE | Freq: Two times a day (BID) | ORAL | 0 refills | Status: AC

## 2020-11-01 NOTE — Assessment & Plan Note (Signed)
Pt is requesting a new referral to sport med

## 2020-11-01 NOTE — Assessment & Plan Note (Signed)
Antiviral per orders  otc cough meds and tylenol prn  Call back prn

## 2020-11-01 NOTE — Progress Notes (Addendum)
MyChart Video Visit    Virtual Visit via Video Note   This visit type was conducted due to national recommendations for restrictions regarding the COVID-19 Pandemic (e.g. social distancing) in an effort to limit this patient's exposure and mitigate transmission in our community. This patient is at least at moderate risk for complications without adequate follow up. This format is felt to be most appropriate for this patient at this time. Physical exam was limited by quality of the video and audio technology used for the visit. [name] was able to get the patient set up on a video visit.  Patient location: Home Patient and provider in visit Provider location: Office  I discussed the limitations of evaluation and management by telemedicine and the availability of in person appointments. The patient expressed understanding and agreed to proceed.  Visit Date: 11/01/2020  Today's healthcare provider: Ann Held, DO     Subjective:    Patient ID: Sean [name], male    DOB: 05/14/1975, 45 y.o.   MRN: [id number]  Chief Complaint  Patient presents with   Covid Positive    HPI Patient is in today for a video visit.  He complains of body aches, fatigue, rhinorrhea, dry cough since last Saturday, 10/30/2020. His cough has become more frequent but is other symptoms first started as body aches then he later developed a dry cough. His symptoms have not improved nor worsened since there onset. He was not exposed to anyone with Covid-19 that he knows of. He tested positive for Covid-19 on 11/01/2020.  He denies having any difficulty breathing.  He is requesting to see another orthopedist specialist as his last one is now out of network.   Past Medical History:  Diagnosis Date   Heartburn    Hyperlipidemia    Hypertension    Sleep apnea    SOB (shortness of breath) on exertion    Swelling    feet and legs    No past surgical history on file.  Family History   Problem Relation Age of Onset   Heart disease Father 45       MI   Hypertension Father    Sudden death Father        Heart disease   Hyperlipidemia Father    Obesity Father    Diabetes Mother        Borderline   Hyperlipidemia Mother    Hypertension Mother    Depression Mother    Obesity Mother    Breast cancer Maternal Grandmother     Social History   Socioeconomic History   Marital status: Single    Spouse name: Not on file   Number of children: Not on file   Years of education: Not on file   Highest education level: Not on file  Occupational History   Occupation: Chief Executive Officer  Tobacco Use   Smoking status: Never   Smokeless tobacco: Never  Substance and Sexual Activity   Alcohol use: Yes   Drug use: No   Sexual activity: Yes  Other Topics Concern   Not on file  Social History Narrative   Not on file   Social Determinants of Health   Financial Resource Strain: Not on file  Food Insecurity: Not on file  Transportation Needs: Not on file  Physical Activity: Not on file  Stress: Not on file  Social Connections: Not on file  Intimate Partner Violence: Not on file    Outpatient Medications Prior to  Visit  Medication Sig Dispense Refill   Ascorbic Acid (VITAMIN C) 1000 MG tablet Take 1,000 mg by mouth daily.     atorvastatin (LIPITOR) 40 MG tablet Take 1 tablet (40 mg total) by mouth daily. 90 tablet 3   blood glucose meter kit and supplies KIT Dispense based on patient and insurance preference. Use up to four times daily as directed. (FOR ICD-9 250.00, 250.01). 1 each 0   celecoxib (CELEBREX) 200 MG capsule Take 1 capsule (200 mg total) by mouth 2 (two) times daily as needed. 60 capsule 6   chlorthalidone (HYGROTON) 25 MG tablet Take 1 tablet (25 mg total) by mouth daily. 90 tablet 1   COVID-19 mRNA vaccine, Moderna, 100 MCG/0.5ML injection INJECT AS DIRECTED .25 mL 0   cyanocobalamin 1000 MCG tablet Take 1,000 mcg by mouth daily.     diclofenac  (VOLTAREN) 75 MG EC tablet Take 1 tablet (75 mg total) by mouth 2 (two) times daily as needed. 60 tablet 3   fenofibrate 160 MG tablet Take 1 tablet (160 mg total) by mouth daily. 90 tablet 3   metFORMIN (GLUCOPHAGE) 500 MG tablet Take 1 tablet (500 mg total) by mouth 2 (two) times daily with a meal. 180 tablet 1   metoprolol succinate (TOPROL-XL) 100 MG 24 hr tablet 1 1/2 po qd 135 tablet 3   Multiple Vitamin (MULTIVITAMIN) capsule Take 1 capsule by mouth daily.     nabumetone (RELAFEN) 750 MG tablet Take 1 tablet (750 mg total) by mouth 2 (two) times daily as needed. 60 tablet 6   NONFORMULARY OR COMPOUNDED ITEM Compression socks #1  As directed   Dx low ext edema 1 each 0   Semaglutide (RYBELSUS) 3 MG TABS Take 1 tablet by mouth daily. 30 tablet 0   traMADol (ULTRAM) 50 MG tablet Take 1 tablet (50 mg total) by mouth every 6 (six) hours as needed. 20 tablet 0   valsartan (DIOVAN) 160 MG tablet Take 1 tablet (160 mg total) by mouth daily. 90 tablet 2   Vitamin D, Ergocalciferol, (DRISDOL) 1.25 MG (50000 UNIT) CAPS capsule Take 1 capsule (50,000 Units total) by mouth every 7 (seven) days. 12 capsule 1   No facility-administered medications prior to visit.    No Known Allergies  Review of Systems  Constitutional:  Positive for malaise/fatigue.  HENT:         (+)rhinorrhea   Respiratory:  Positive for cough (dry cough). Negative for shortness of breath.   Musculoskeletal:  Positive for myalgias.      Objective:    Physical Exam Vitals and nursing note reviewed.  Constitutional:      General: He is not in acute distress.    Appearance: Normal appearance.  Pulmonary:     Effort: Pulmonary effort is normal.  Neurological:     Mental Status: He is alert.  Psychiatric:        Behavior: Behavior normal.        Judgment: Judgment normal.    There were no vitals taken for this visit. Wt Readings from Last 3 Encounters:  03/25/20 (!) 428 lb 6.4 oz (194.3 kg)  12/23/19 (!) 422 lb 9.6  oz (191.7 kg)  11/25/19 (!) 421 lb 9.6 oz (191.2 kg)    Diabetic Foot Exam - Simple   No data filed    Lab Results  Component Value Date   WBC 11.6 (H) 07/22/2019   HGB 14.6 07/22/2019   HCT 42.6 07/22/2019   PLT 212.0 07/22/2019  GLUCOSE 85 03/25/2020   CHOL 159 03/25/2020   TRIG 170.0 (H) 03/25/2020   HDL 38.70 (L) 03/25/2020   LDLDIRECT 107.0 08/05/2019   LDLCALC 86 03/25/2020   ALT 33 03/25/2020   AST 21 03/25/2020   NA 140 03/25/2020   K 3.7 03/25/2020   CL 101 03/25/2020   CREATININE 1.18 03/25/2020   BUN 17 03/25/2020   CO2 33 (H) 03/25/2020   TSH 2.740 03/14/2018   HGBA1C 5.7 03/25/2020   MICROALBUR 0.7 03/25/2020    Lab Results  Component Value Date   TSH 2.740 03/14/2018   Lab Results  Component Value Date   WBC 11.6 (H) 07/22/2019   HGB 14.6 07/22/2019   HCT 42.6 07/22/2019   MCV 85.9 07/22/2019   PLT 212.0 07/22/2019   Lab Results  Component Value Date   NA 140 03/25/2020   K 3.7 03/25/2020   CO2 33 (H) 03/25/2020   GLUCOSE 85 03/25/2020   BUN 17 03/25/2020   CREATININE 1.18 03/25/2020   BILITOT 0.7 03/25/2020   ALKPHOS 59 03/25/2020   AST 21 03/25/2020   ALT 33 03/25/2020   PROT 7.0 03/25/2020   ALBUMIN 4.4 03/25/2020   CALCIUM 9.9 03/25/2020   ANIONGAP 11 08/07/2017   GFR 74.66 03/25/2020   Lab Results  Component Value Date   CHOL 159 03/25/2020   Lab Results  Component Value Date   HDL 38.70 (L) 03/25/2020   Lab Results  Component Value Date   LDLCALC 86 03/25/2020   Lab Results  Component Value Date   TRIG 170.0 (H) 03/25/2020   Lab Results  Component Value Date   CHOLHDL 4 03/25/2020   Lab Results  Component Value Date   HGBA1C 5.7 03/25/2020       Assessment & Plan:   Problem List Items Addressed This Visit       Unprioritized   Bronchitis due to COVID-19 virus - Primary    Antiviral per orders  otc cough meds and tylenol prn  Call back prn       Relevant Medications   molnupiravir EUA (LAGEVRIO)  200 mg CAPS capsule   Hip pain    Pt is requesting a new referral to sport med       Relevant Orders   Ambulatory referral to Sports Medicine     Meds ordered this encounter  Medications   molnupiravir EUA (LAGEVRIO) 200 mg CAPS capsule    Sig: Take 4 capsules (800 mg total) by mouth 2 (two) times daily for 5 days.    Dispense:  40 capsule    Refill:  0    I discussed the assessment and treatment plan with the patient. The patient was provided an opportunity to ask questions and all were answered. The patient agreed with the plan and demonstrated an understanding of the instructions.   The patient was advised to call back or seek an in-person evaluation if the symptoms worsen or if the condition fails to improve as anticipated.  I,[name],acting as a Education administrator for [employer], DO.,have documented all relevant documentation on the behalf of Ann Held, DO,as directed by  Ann Held, DO while in the presence of Ann Held, DO.  I provided 20 minutes of face-to-face time during this encounter.   Ann Held, DO [facility] [employer] [facility] [phone] (phone) [phone] (fax)  [place]

## 2020-11-03 ENCOUNTER — Ambulatory Visit: Payer: 59 | Admitting: Physical Therapy

## 2020-11-04 ENCOUNTER — Telehealth: Payer: Self-pay | Admitting: Family Medicine

## 2020-11-04 ENCOUNTER — Other Ambulatory Visit: Payer: Self-pay | Admitting: Family Medicine

## 2020-11-04 DIAGNOSIS — I10 Essential (primary) hypertension: Secondary | ICD-10-CM

## 2020-11-04 DIAGNOSIS — E559 Vitamin D deficiency, unspecified: Secondary | ICD-10-CM

## 2020-11-04 MED ORDER — VALSARTAN 160 MG PO TABS: 160.0000 mg | ORAL_TABLET | Freq: Every day | ORAL | 2 refills | Status: DC

## 2020-11-04 NOTE — Telephone Encounter (Signed)
Patient notified that all rxs have been sent in.

## 2020-11-04 NOTE — Telephone Encounter (Signed)
Would you like Pt to continue Ergocalciferol?  

## 2020-11-04 NOTE — Telephone Encounter (Signed)
Medication:  valsartan (DIOVAN) 160 MG tablet  metoprolol succinate (TOPROL-XL) 100 MG 24 hr tablet chlorthalidone (HYGROTON) 25 MG tablet Vitamin D, Ergocalciferol, (DRISDOL) 1.25 MG (50000 UNIT) CAPS capsule   Has the patient contacted their pharmacy? Yes.   (If no, request that the patient contact the pharmacy for the refill.) (If yes, when and what did the pharmacy advise?)  Pharmacy informed him to contact his pcp to have meds refilled  Preferred Pharmacy (with phone number or street name):  [id number] - [place], [place] - [address]  [address], [place] [address]  Phone:  [phone]  Fax:  [phone]

## 2020-11-08 ENCOUNTER — Telehealth: Payer: Self-pay | Admitting: Family Medicine

## 2020-11-08 NOTE — Telephone Encounter (Signed)
Patient would like a work note stating he is good to go back to work. He will come pick it up from the office once contacted stating that letter is ready. Please advice.

## 2020-11-09 NOTE — Telephone Encounter (Signed)
Pt called. LVM. Letter placed at the front

## 2020-11-12 ENCOUNTER — Other Ambulatory Visit: Payer: Self-pay

## 2020-11-12 ENCOUNTER — Ambulatory Visit (INDEPENDENT_AMBULATORY_CARE_PROVIDER_SITE_OTHER): Payer: BC Managed Care – PPO | Admitting: Physical Therapy

## 2020-11-12 ENCOUNTER — Encounter: Payer: Self-pay | Admitting: Physical Therapy

## 2020-11-12 DIAGNOSIS — M6281 Muscle weakness (generalized): Secondary | ICD-10-CM | POA: Diagnosis not present

## 2020-11-12 DIAGNOSIS — M25551 Pain in right hip: Secondary | ICD-10-CM

## 2020-11-12 DIAGNOSIS — R29898 Other symptoms and signs involving the musculoskeletal system: Secondary | ICD-10-CM | POA: Diagnosis not present

## 2020-11-12 NOTE — Therapy (Signed)
[facility] [place] [address] [place], [place], [address] Phone: [phone]   Fax:  [phone]  Physical Therapy Evaluation  Patient Details  Name: Sean [name] MRN: [id number] Date of Birth: Jan 31, 1976 Referring Provider (PT): Lavada Mesi   Encounter Date: 11/12/2020   PT End of Session - 11/12/20 1534     Visit Number 1    Number of Visits 12    Date for PT Re-Evaluation 12/24/20    PT Start Time 1445    PT Stop Time 1530    PT Time Calculation (min) 45 min    Activity Tolerance Patient tolerated treatment well    Behavior During Therapy [facility] for tasks assessed/performed             Past Medical History:  Diagnosis Date   Heartburn    Hyperlipidemia    Hypertension    Sleep apnea    SOB (shortness of breath) on exertion    Swelling    feet and legs    History reviewed. No pertinent surgical history.  There were no vitals filed for this visit.    Subjective Assessment - 11/12/20 1445     Subjective Pt has been having Rt hip pain x 2 years and the pain worsened enough that pt went to MD. Pt has had 2 hip injections and then had MRI which showed a tear (per patient) but surgery is not indicated due to BMI. Pain increases with crossing LEs to put sock on and with wiping himself after toileting. Pain decreases breifly with injections but pain relief does not last.    Pertinent History multiple injections for knees    Limitations Standing;Walking    How long can you stand comfortably? 20-30 minutes    How long can you walk comfortably? 20 minutes    Patient Stated Goals decrease pain and return to exercise routine    Currently in Pain? Yes    Pain Score 3     Pain Location Hip    Pain Orientation Right    Pain Descriptors / Indicators Sharp    Pain Type Chronic pain    Pain Onset More than a month ago    Pain Frequency Intermittent    Aggravating Factors  crossing legs, walk, stand, stairs    Pain  Relieving Factors meds                [facility] PT Assessment - 11/12/20 0001       Assessment   Medical Diagnosis Rt hip pain    Referring Provider (PT) Hilts, Michael    Onset Date/Surgical Date 11/07/18    Next MD Visit 12/01/20    Prior Therapy yes for knees      Precautions   Precautions None      Restrictions   Weight Bearing Restrictions No      Balance Screen   Has the patient fallen in the past 6 months No      Home Environment   Additional Comments lives in second story condo      Prior Function   Level of Independence Independent      Observation/Other Assessments   Focus on Therapeutic Outcomes (FOTO)  58      ROM / Strength   AROM / PROM / Strength AROM;Strength      AROM   AROM Assessment Site Hip    Right/Left Hip Right;Left    Right Hip External Rotation  33    Right Hip  Internal Rotation  24    Right Hip ABduction 13    Left Hip External Rotation  45    Left Hip Internal Rotation  40    Left Hip ABduction 30      Strength   Strength Assessment Site Hip    Right/Left Hip Right;Left    Right Hip Flexion 3/5    Right Hip ABduction 3/5    Left Hip Flexion 4+/5    Left Hip ABduction 4/5      Flexibility   Soft Tissue Assessment /Muscle Length yes    Hamstrings decreased Rt due to pain    Piriformis decreased Rt due to pain      Palpation   Palpation comment mild TTP anterior hip/hip flexors, no TTP greater trochanter      Special Tests    Special Tests Hip Special Tests    Hip Special Tests  Anterior Hip Impingement Test;Hip Scouring      Hip Scouring   Findings Negative    Side Right      Anterior Hip Impingement Test    Findings Negative    Side  Right                        Objective measurements completed on examination: See above findings.       [facility] Adult PT Treatment/Exercise - 11/12/20 0001       Exercises   Exercises Knee/Hip      Knee/Hip Exercises: Stretches   Other Knee/Hip Stretches supine  hip ER/IR bent knee falls x 10 Rt      Knee/Hip Exercises: Supine   Bridges 10 reps    Straight Leg Raises 10 reps      Knee/Hip Exercises: Sidelying   Hip ABduction 10 reps      Knee/Hip Exercises: Prone   Hip Extension 10 reps                     PT Education - 11/12/20 1521     Education Details PT POC and goals, HEP    Person(s) Educated Patient    Methods Explanation;Demonstration;Handout    Comprehension Returned demonstration;Verbalized understanding                 PT Long Term Goals - 11/12/20 1625       PT LONG TERM GOAL #1   Title Pt will be independent in HEP    Time 6    Period Weeks    Status New    Target Date 12/24/20      PT LONG TERM GOAL #2   Title Pt will improve FOTO to >= 69 to demo improved functional mobility    Time 6    Period Weeks    Status New    Target Date 12/24/20      PT LONG TERM GOAL #3   Title Pt will improve Rt hip strength to 4+/5 to tolerate standing and walking for work    Time 6    Period Weeks    Status New    Target Date 12/24/20      PT LONG TERM GOAL #4   Title Pt will don sock on Rt foot with pain <= 2/10    Time 6    Period Weeks    Status New    Target Date 12/24/20  Plan - 11/12/20 1537     Clinical Impression Statement Pt is a 45 y/o male referred for Rt hip pain. Pt presents with increased pain, decreased strength and ROM and decreased functional activity tolerance. Pt will benefit from skilled PT to address deficits and improve functional mobility    Personal Factors and Comorbidities Time since onset of injury/illness/exacerbation;Comorbidity 1    Examination-Activity Limitations Stand;Stairs;Transfers;Squat    Examination-Participation Restrictions Occupation;Yard Work;Community Activity    Stability/Clinical Decision Making Stable/Uncomplicated    Clinical Decision Making Low    Rehab Potential Good    PT Frequency 2x / week    PT Duration 6 weeks     PT Treatment/Interventions Cryotherapy;Aquatic Therapy;Electrical Stimulation;Iontophoresis 4mg /ml Dexamethasone;Moist Heat;Stair training;Gait training;Therapeutic exercise;Balance training;Neuromuscular re-education;Therapeutic activities;Patient/family education;Manual techniques;Dry needling;Taping    PT Next Visit Plan assess hip extension, ionto?, assess HEP, progress hip strength as tolerated    PT Home Exercise Plan [email or web address]    Consulted and Agree with Plan of Care Patient             Patient will benefit from skilled therapeutic intervention in order to improve the following deficits and impairments:  Pain, Impaired flexibility, Decreased strength, Decreased activity tolerance, Decreased range of motion, Decreased mobility  Visit Diagnosis: Pain in right hip - Plan: PT plan of care cert/re-cert  Muscle weakness (generalized) - Plan: PT plan of care cert/re-cert  Other symptoms and signs involving the musculoskeletal system - Plan: PT plan of care cert/re-cert     Problem List Patient Active Problem List   Diagnosis Date Noted   Bronchitis due to COVID-19 virus 11/01/2020   Hip pain 11/01/2020   Right thigh pain 05/13/2019   Uncontrolled type 2 diabetes mellitus with hyperglycemia (HCC) 04/10/2019   Hyperlipidemia associated with type 2 diabetes mellitus (HCC) 04/10/2019   Cutaneous skin tags 04/10/2019   Class 3 severe obesity with serious comorbidity and body mass index (BMI) of 50.0 to 59.9 in adult (HCC) 03/18/2018   Cellulitis of left lower extremity 09/07/2017   History of cellulitis 08/19/2017   Hypokalemia 08/19/2017   Lower extremity edema 08/19/2017   Depression 02/15/2017   Prediabetes 09/19/2016   Vitamin D deficiency 09/19/2016   Other fatigue 09/05/2016   Dyspnea on exertion 09/05/2016   Snoring 09/05/2016   Morbid obesity (HCC) 09/05/2016   Patellofemoral arthritis of right knee 08/11/2016   Severe obesity (BMI >= 40) (HCC) 05/09/2015    Hyperlipidemia 02/19/2014   Sinusitis, acute maxillary 02/09/2014   Infected laceration 02/09/2014   HTN (hypertension) 03/13/2011    ,, PT 11/12/2020, 4:28 PM  [facility] [address] [place], [place], [place] Phone: [phone]   Fax:  [phone]  Name: Sean [name] MRN: Aurea Graff Date of Birth: 1975-08-13

## 2020-11-12 NOTE — Patient Instructions (Signed)
Access Code: [email or web address] URL: [email or web address] Date: 11/12/2020 Prepared by: Reggy Eye  Exercises Supine Bilateral Hip Internal Rotation Stretch - 1 x daily - 7 x weekly - 1 sets - 10 reps - 3-5 seconds hold Bent Knee Fallouts - 1 x daily - 7 x weekly - 1 sets - 10 reps - 3-5 minutes hold Small Range Straight Leg Raise - 1 x daily - 7 x weekly - 2 sets - 10 reps Supine Bridge - 1 x daily - 7 x weekly - 2 sets - 10 reps Sidelying Hip Abduction - 1 x daily - 7 x weekly - 2 sets - 10 reps Prone Hip Extension - 1 x daily - 7 x weekly - 2 sets - 10 reps  Aquatic Therapy: What to Expect!  Where:  [facility] [address] [place], [place] [address] [phone]           How to Prepare:  If you require assistance with dressing, with transportation (ie: wheel chair), or toileting, a caregiver must attend the entire session with you (unless your primary therapists feels this is not necessary).   If there is thunder during your appointment, you will be asked to leave pool area. You have the option to finish your session in the physical therapy area near the gym. Masks in the pool area are optional. Your face will remain dry during your session, so you are welcome to keep your mask on, if desired. You will be spaced at least 6 feet from other aquatic patients.  Please bring your own swim towel to dry off with.   There are Men's and Women's locker rooms with showers, as well as gender neutral bathrooms in the pool area.  Please arrive IN YOUR SUIT and a few minutes prior to your appointment - this helps to avoid delays in starting your session. Head to the pool and await your appointment on the bench on the pool deck. Please make sure to attend to any toileting needs prior to entering the pool. Once on the pool deck your therapist will ask you to sign the Patient  Consent and Assignment of Benefits form. Your therapist may take your blood  pressure prior to, during and after your session if indicated. We usually try and create a home exercise program based on activities we do in the pool. Some patients do not want to or do not have the ability to participate in an aquatic home program - this is not a barrier in any way to you participating in aquatic therapy as part of your current therapy plan!  Appointments:  All sessions are 45 minutes  About the pool: Entering the pool: Your therapist will assist you if needed; there are two ways to enter the pool - stairs or a mechanical lift. Your therapist will determine the most appropriate way for you. Water temperature is usually around 91-95.  There is a lap pool with a temperature around 84 There may be other therapists and patients in the pool at the same time.   Contact Info:            To cancel appointment, please call [facility] at [phone] If you are running late, please call [facility] at [phone]

## 2020-11-16 ENCOUNTER — Telehealth: Payer: Self-pay | Admitting: *Deleted

## 2020-11-16 NOTE — Telephone Encounter (Signed)
Error

## 2020-11-24 ENCOUNTER — Ambulatory Visit (INDEPENDENT_AMBULATORY_CARE_PROVIDER_SITE_OTHER): Payer: BC Managed Care – PPO | Admitting: Physical Therapy

## 2020-11-24 ENCOUNTER — Other Ambulatory Visit: Payer: Self-pay

## 2020-11-24 DIAGNOSIS — M25551 Pain in right hip: Secondary | ICD-10-CM | POA: Diagnosis not present

## 2020-11-24 DIAGNOSIS — M6281 Muscle weakness (generalized): Secondary | ICD-10-CM | POA: Diagnosis not present

## 2020-11-24 DIAGNOSIS — R29898 Other symptoms and signs involving the musculoskeletal system: Secondary | ICD-10-CM | POA: Diagnosis not present

## 2020-11-24 NOTE — Therapy (Addendum)
[place], [place], [address] Phone: [phone]   Fax:  [phone]  Physical Therapy Treatment and Discharge  Patient Details  Name: Sean [name] MRN: [id number] Date of Birth: 1975/09/21 Referring Provider (PT): Eunice Blase   Encounter Date: 11/24/2020   PT End of Session - 11/24/20 1553     Visit Number 2    Number of Visits 12    Date for PT Re-Evaluation 12/24/20    PT Start Time 3845    PT Stop Time 1552    PT Time Calculation (min) 45 min    Activity Tolerance Patient tolerated treatment well    Behavior During Therapy [facility] for tasks assessed/performed             Past Medical History:  Diagnosis Date   Heartburn    Hyperlipidemia    Hypertension    Sleep apnea    SOB (shortness of breath) on exertion    Swelling    feet and legs    No past surgical history on file.  There were no vitals filed for this visit.   Subjective Assessment - 11/24/20 1508     Subjective Pt states he has been performing HEP and that he feels "ok". No real change in symptoms since eval    Patient Stated Goals decrease pain and return to exercise routine    Currently in Pain? Yes    Pain Score 3     Pain Location Hip    Pain Orientation Right    Pain Descriptors / Indicators Patsi Sears PT Assessment - 11/24/20 0001       Assessment   Medical Diagnosis Rt hip pain    Referring Provider (PT) Hilts, Michael    Onset Date/Surgical Date 11/07/18    Next MD Visit 12/01/20    Prior Therapy yes for knees      Palpation   Palpation comment hypomoble laterl glides Rt hip                           [facility] Adult PT Treatment/Exercise - 11/24/20 0001       Knee/Hip Exercises: Stretches   Sports administrator Right;2 reps;30 seconds    Quad Stretch Limitations prone with strap    Other Knee/Hip Stretches supine hip ER/IR bent knee falls x 10 Rt      Knee/Hip  Exercises: Aerobic   Tread Mill 3 min 2.64mh for warm up      Knee/Hip Exercises: Supine   Bridges 10 reps      Knee/Hip Exercises: Sidelying   Hip ABduction 15 reps    Clams 10 reps      Modalities   Modalities Iontophoresis      Iontophoresis   Type of Iontophoresis Dexamethasone    Location Rt hip flexors    Dose 1 ml    Time 4 hours      Manual Therapy   Manual Therapy Joint mobilization    Joint Mobilization Rt lateral hip jt mobs grade 2-3 with belt                     PT Education - 11/24/20 1548     Education Details ionto, updated HEP    Person(s) Educated Patient    Methods Explanation;Demonstration;Handout    Comprehension Returned demonstration;Verbalized understanding  PT Long Term Goals - 11/12/20 1625       PT LONG TERM GOAL #1   Title Pt will be independent in HEP    Time 6    Period Weeks    Status New    Target Date 12/24/20      PT LONG TERM GOAL #2   Title Pt will improve FOTO to >= 69 to demo improved functional mobility    Time 6    Period Weeks    Status New    Target Date 12/24/20      PT LONG TERM GOAL #3   Title Pt will improve Rt hip strength to 4+/5 to tolerate standing and walking for work    Time 6    Period Weeks    Status New    Target Date 12/24/20      PT LONG TERM GOAL #4   Title Pt will don sock on Rt foot with pain <= 2/10    Time 6    Period Weeks    Status New    Target Date 12/24/20                   Plan - 11/24/20 1553     Clinical Impression Statement Pt continues with pain with hip IR in prone and supine, able to tolerate gentle joint mobs Rt hip. Added prone quad stretch to HEP. Trialed 1st does of ionto today    PT Next Visit Plan ionto?, progress hip strength and ROM    PT Home Exercise Plan [email or web address]    Consulted and Agree with Plan of Care Patient             Patient will benefit from skilled therapeutic intervention in order to improve the  following deficits and impairments:     Visit Diagnosis: Pain in right hip  Muscle weakness (generalized)  Other symptoms and signs involving the musculoskeletal system     Problem List Patient Active Problem List   Diagnosis Date Noted   Bronchitis due to COVID-19 virus 11/01/2020   Hip pain 11/01/2020   Right thigh pain 05/13/2019   Uncontrolled type 2 diabetes mellitus with hyperglycemia ([place]) 04/10/2019   Hyperlipidemia associated with type 2 diabetes mellitus ([place]) 04/10/2019   Cutaneous skin tags 04/10/2019   Class 3 severe obesity with serious comorbidity and body mass index (BMI) of 50.0 to 59.9 in adult ([place]) 03/18/2018   Cellulitis of left lower extremity 09/07/2017   History of cellulitis 08/19/2017   Hypokalemia 08/19/2017   Lower extremity edema 08/19/2017   Depression 02/15/2017   Prediabetes 09/19/2016   Vitamin D deficiency 09/19/2016   Other fatigue 09/05/2016   Dyspnea on exertion 09/05/2016   Snoring 09/05/2016   Morbid obesity ([place]) 09/05/2016   Patellofemoral arthritis of right knee 08/11/2016   Severe obesity (BMI >= 40) ([place]) 05/09/2015   Hyperlipidemia 02/19/2014   Sinusitis, acute maxillary 02/09/2014   Infected laceration 02/09/2014   HTN (hypertension) 03/13/2011  PHYSICAL THERAPY DISCHARGE SUMMARY  Visits from Start of Care: 2  Current functional level related to goals / functional outcomes: Continues with pain and impaired mobility   Remaining deficits: See above   Education / Equipment: HEP   Patient agrees to discharge. Patient goals were not met. Patient is being discharged due to not returning since the last visit. Isabelle Course, PT,DPT11/18/2210:50 AM    Isabelle Course, PT 11/24/2020, 3:55 PM  [place],  [place], [address] Phone: [phone]   Fax:  [phone]  Name: BENTLEY FISSEL MRN: [id number] Date of Birth: [birth year]

## 2020-11-24 NOTE — Patient Instructions (Signed)
Access Code: [email or web address] URL: [email or web address] Date: 11/24/2020 Prepared by: Reggy Eye  Exercises Supine Bilateral Hip Internal Rotation Stretch - 1 x daily - 7 x weekly - 1 sets - 10 reps - 3-5 seconds hold Bent Knee Fallouts - 1 x daily - 7 x weekly - 1 sets - 10 reps - 3-5 sec hold Small Range Straight Leg Raise - 1 x daily - 7 x weekly - 2 sets - 10 reps Supine Bridge - 1 x daily - 7 x weekly - 2 sets - 10 reps Sidelying Hip Abduction - 1 x daily - 7 x weekly - 2 sets - 10 reps Prone Hip Extension - 1 x daily - 7 x weekly - 2 sets - 10 reps Prone Quadriceps Stretch with Strap - 1 x daily - 7 x weekly - 3 sets - 1 reps - 20-30 sec hold  Patient Education [place] Patient Instructions

## 2020-11-26 ENCOUNTER — Encounter: Payer: BC Managed Care – PPO | Admitting: Physical Therapy

## 2020-11-30 NOTE — Progress Notes (Signed)
[place] Phone: [phone] Subjective:   Fontaine No, am serving as a scribe for Dr. Hulan Saas. This visit occurred during the SARS-CoV-2 public health emergency.  Safety protocols were in place, including screening questions prior to the visit, additional usage of staff PPE, and extensive cleaning of exam room while observing appropriate contact time as indicated for disinfecting solutions.   I'm seeing this patient by the request  of:  Ann Held, DO  CC: hip pain   [id number]  JJ ENYEART is a 45 y.o. male coming in with complaint of hip pain right sided for past 2 years. Pain in the groin. Over the past 2 years pain had become worse. Was referred to Dr. Junius Roads. Was given antiinflammatory that did help somewhat. Saw another provider and diagnosed with labral tear of hip. Had injection in hip joint 6/13. and was sent to PT. Injections have helped. Has just started PT but will be taking a break due to getting married this weekend. Using Celebrex now but does not feel that it is helping.   Patient is no longer seeing Dr. Junius Roads as he went into private practice. Patient is on his feet 8-10 hours a day at [facility]. Pain remains constant. Unable to lift leg to put sock or shoe on.    Reviewed hip xrays from 05/13/19 which were normal.   Per report from Dr. 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   Past Medical History:  Diagnosis Date   Heartburn    Hyperlipidemia    Hypertension    Sleep apnea    SOB (shortness of breath) on exertion    Swelling    feet and legs   No past surgical history on file. Social History   Socioeconomic History   Marital status: Single    Spouse name: Not on file   Number of children: Not on file   Years of education: Not on file   Highest education level: Not on  file  Occupational History   Occupation: Chief Executive Officer  Tobacco Use   Smoking status: Never   Smokeless tobacco: Never  Substance and Sexual Activity   Alcohol use: Yes   Drug use: No   Sexual activity: Yes  Other Topics Concern   Not on file  Social History Narrative   Not on file   Social Determinants of Health   Financial Resource Strain: Not on file  Food Insecurity: Not on file  Transportation Needs: Not on file  Physical Activity: Not on file  Stress: Not on file  Social Connections: Not on file   No Known Allergies Family History  Problem Relation Age of Onset   Heart disease Father 58       MI   Hypertension Father    Sudden death Father        Heart disease   Hyperlipidemia Father    Obesity Father    Diabetes Mother        Borderline   Hyperlipidemia Mother    Hypertension Mother    Depression Mother    Obesity Mother    Breast cancer Maternal Grandmother     Current Outpatient Medications (Endocrine & Metabolic):    metFORMIN (GLUCOPHAGE) 500 MG tablet, Take 1 tablet (500 mg total) by mouth 2 (two) times daily with a meal.  predniSONE (DELTASONE) 20 MG tablet, Take 2 tablets (40 mg total) by mouth daily with breakfast.   Semaglutide (RYBELSUS) 3 MG TABS, Take 1 tablet by mouth daily.  Current Outpatient Medications (Cardiovascular):    atorvastatin (LIPITOR) 40 MG tablet, Take 1 tablet (40 mg total) by mouth daily.   chlorthalidone (HYGROTON) 25 MG tablet, Take 1 tablet by mouth once daily   fenofibrate 160 MG tablet, Take 1 tablet (160 mg total) by mouth daily.   metoprolol succinate (TOPROL-XL) 100 MG 24 hr tablet, TAKE 1 & 1/2 (ONE & ONE-HALF) TABLETS BY MOUTH ONCE DAILY   valsartan (DIOVAN) 160 MG tablet, Take 1 tablet (160 mg total) by mouth daily.   Current Outpatient Medications (Analgesics):    celecoxib (CELEBREX) 200 MG capsule, Take 1 capsule (200 mg total) by mouth 2 (two) times daily as needed.   meloxicam (MOBIC) 15 MG  tablet, Take 1 tablet (15 mg total) by mouth daily.  Current Outpatient Medications (Hematological):    cyanocobalamin 1000 MCG tablet, Take 1,000 mcg by mouth daily.  Current Outpatient Medications (Other):    Ascorbic Acid (VITAMIN C) 1000 MG tablet, Take 1,000 mg by mouth daily.   blood glucose meter kit and supplies KIT, Dispense based on patient and insurance preference. Use up to four times daily as directed. (FOR ICD-9 250.00, 250.01).   COVID-19 mRNA vaccine, Moderna, 100 MCG/0.5ML injection, INJECT AS DIRECTED   Multiple Vitamin (MULTIVITAMIN) capsule, Take 1 capsule by mouth daily.   NONFORMULARY OR COMPOUNDED ITEM, Compression socks #1  As directed   Dx low ext edema   Vitamin D, Ergocalciferol, (DRISDOL) 1.25 MG (50000 UNIT) CAPS capsule, Take 1 capsule by mouth once a week   Reviewed prior external information including notes and imaging from  primary care provider As well as notes that were available from care everywhere and other healthcare systems.  This includes what patient is doing including the injections in the hips as stated previously.  Past medical history, social, surgical and family history all reviewed in electronic medical record.  No pertanent information unless stated regarding to the chief complaint.   Review of Systems:  No headache, visual changes, nausea, vomiting, diarrhea, constipation, dizziness, abdominal pain, skin rash, fevers, chills, night sweats, weight loss, swollen lymph nodes, body aches, joint swelling, chest pain, shortness of breath, mood changes. POSITIVE muscle aches  Objective  Blood pressure (!) 130/98, pulse 72, height _0  (1.854 m), weight (!) 412 lb (186.9 kg), SpO2 98 %.   General: No apparent distress alert and oriented x3 mood and affect normal, dressed appropriately.  Morbidly obese HEENT: Pupils equal, extraocular movements intact  Respiratory: Patient's speak in full sentences and does not appear short of breath   Cardiovascular: No lower extremity edema, non tender, no erythema  Gait antalgic  MSK: Right hip exam shows less than 5 degrees of internal rotation with increasing pain in the hip area.  Negative straight leg test.  Patient does have some weakness with hip flexion against resistance compared to the contralateral side.  Minimal pain over the greater trochanteric area.    Impression and Recommendations:     The above documentation has been reviewed and is accurate and complete Lyndal Pulley, DO

## 2020-12-01 ENCOUNTER — Ambulatory Visit: Payer: BC Managed Care – PPO | Admitting: Family Medicine

## 2020-12-01 ENCOUNTER — Encounter: Payer: Self-pay | Admitting: Family Medicine

## 2020-12-01 ENCOUNTER — Ambulatory Visit (INDEPENDENT_AMBULATORY_CARE_PROVIDER_SITE_OTHER): Payer: BC Managed Care – PPO

## 2020-12-01 ENCOUNTER — Other Ambulatory Visit: Payer: Self-pay

## 2020-12-01 VITALS — BP 130/98 | HR 72 | Ht 73.0 in | Wt >= 6400 oz

## 2020-12-01 DIAGNOSIS — E669 Obesity, unspecified: Secondary | ICD-10-CM

## 2020-12-01 DIAGNOSIS — M25551 Pain in right hip: Secondary | ICD-10-CM | POA: Diagnosis not present

## 2020-12-01 MED ORDER — KETOROLAC TROMETHAMINE 60 MG/2ML IM SOLN
60.0000 mg | Freq: Once | INTRAMUSCULAR | Status: AC
  Administered 2020-12-01: 15:00:00 60 mg via INTRAMUSCULAR

## 2020-12-01 MED ORDER — MELOXICAM 15 MG PO TABS: 15.0000 mg | ORAL_TABLET | Freq: Every day | ORAL | 0 refills | Status: DC

## 2020-12-01 MED ORDER — PREDNISONE 20 MG PO TABS: 40.0000 mg | ORAL_TABLET | Freq: Every day | ORAL | 0 refills | Status: DC

## 2020-12-01 MED ORDER — METHYLPREDNISOLONE ACETATE 80 MG/ML IJ SUSP
80.0000 mg | Freq: Once | INTRAMUSCULAR | Status: AC
  Administered 2020-12-01: 15:00:00 80 mg via INTRAMUSCULAR

## 2020-12-01 NOTE — Assessment & Plan Note (Signed)
Discussed with patient that I do feel that severe obesity is causing most of his difficulty with his health as well as with his right hip.  Patient likely will need a right hip replacement in the near future but unfortunately BMI is too high.  We need to get patient's BMI under 40.  Do feel that the best way to potentially cause or help this because would be gastric bypass surgery and will refer for further evaluation at this time.

## 2020-12-01 NOTE — Addendum Note (Signed)
Addended by: Evon Slack on: 12/01/2020 03:27 PM   Modules accepted: Orders

## 2020-12-01 NOTE — Patient Instructions (Signed)
  Prednisone 40mg  daily for 5 days starting tmrw Meloxicam 15mg  to take after 5 days if needed in 5 day bursts Do not take Celebrex OTC Tart Cherry Extract 1200mg  at night Tylenol 500mg  3x a day Referral to general surgery for gastric bypass See me in 6-8 weeks

## 2020-12-03 ENCOUNTER — Encounter: Payer: Self-pay | Admitting: Physical Therapy

## 2020-12-15 ENCOUNTER — Encounter: Payer: BC Managed Care – PPO | Admitting: Physical Therapy

## 2020-12-16 ENCOUNTER — Other Ambulatory Visit: Payer: Self-pay

## 2020-12-16 ENCOUNTER — Ambulatory Visit: Payer: BC Managed Care – PPO | Admitting: Family Medicine

## 2020-12-16 ENCOUNTER — Encounter: Payer: Self-pay | Admitting: Family Medicine

## 2020-12-16 DIAGNOSIS — E1169 Type 2 diabetes mellitus with other specified complication: Secondary | ICD-10-CM

## 2020-12-16 DIAGNOSIS — M25559 Pain in unspecified hip: Secondary | ICD-10-CM

## 2020-12-16 DIAGNOSIS — Z23 Encounter for immunization: Secondary | ICD-10-CM | POA: Diagnosis not present

## 2020-12-16 DIAGNOSIS — E785 Hyperlipidemia, unspecified: Secondary | ICD-10-CM

## 2020-12-16 DIAGNOSIS — I10 Essential (primary) hypertension: Secondary | ICD-10-CM | POA: Diagnosis not present

## 2020-12-16 DIAGNOSIS — R7303 Prediabetes: Secondary | ICD-10-CM

## 2020-12-16 MED ORDER — FENOFIBRATE 160 MG PO TABS
160.0000 mg | ORAL_TABLET | Freq: Every day | ORAL | 3 refills | Status: DC
Start: 2020-12-16 — End: 2021-07-05

## 2020-12-16 MED ORDER — VALSARTAN 160 MG PO TABS: 160.0000 mg | ORAL_TABLET | Freq: Every day | ORAL | 2 refills | Status: DC

## 2020-12-16 MED ORDER — METFORMIN HCL 500 MG PO TABS: 500.0000 mg | ORAL_TABLET | Freq: Two times a day (BID) | ORAL | 1 refills | Status: DC

## 2020-12-16 MED ORDER — METOPROLOL SUCCINATE ER 100 MG PO TB24
ORAL_TABLET | ORAL | 1 refills | Status: DC
Start: 2020-12-16 — End: 2021-07-05

## 2020-12-16 MED ORDER — ATORVASTATIN CALCIUM 40 MG PO TABS
40.0000 mg | ORAL_TABLET | Freq: Every day | ORAL | 3 refills | Status: DC
Start: 2020-12-16 — End: 2022-01-23

## 2020-12-16 MED ORDER — SAXENDA 18 MG/3ML ~~LOC~~ SOPN: 3.0000 mg | PEN_INJECTOR | Freq: Every day | SUBCUTANEOUS | 5 refills | Status: DC

## 2020-12-16 NOTE — Assessment & Plan Note (Signed)
Referral to surgery done saxenda sent to pharmacy F/u 1 month con't diet

## 2020-12-16 NOTE — Patient Instructions (Signed)
Obesity, Adult ?Obesity is the condition of having too much total body fat. Being overweight or obese means that your weight is greater than what is considered healthy for your body size. Obesity is determined by a measurement called BMI (body mass index). BMI is an estimate of body fat and is calculated from height and weight. For adults, a BMI of 30 or higher is considered obese. ?Obesity can lead to other health concerns and major illnesses, including: ?Stroke. ?Coronary artery disease (CAD). ?Type 2 diabetes. ?Some types of cancer, including cancers of the colon, breast, uterus, and gallbladder. ?High blood pressure (hypertension). ?High cholesterol. ?Gallbladder stones. ?Obesity can also contribute to: ?Osteoarthritis. ?Sleep apnea. ?Infertility problems. ?What are the causes? ?Common causes of this condition include: ?Eating daily meals that are high in calories, sugar, and fat. ?Drinking high amounts of sugar-sweetened beverages, such as soft drinks. ?Being born with genes that may make you more likely to become obese. ?Having a medical condition that causes obesity, including: ?Hypothyroidism. ?Polycystic ovarian syndrome (PCOS). ?Binge-eating disorder. ?Cushing syndrome. ?Taking certain medicines, such as steroids, antidepressants, and seizure medicines. ?Not being physically active (sedentary lifestyle). ?Not getting enough sleep. ?What increases the risk? ?The following factors may make you more likely to develop this condition: ?Having a family history of obesity. ?Living in an area with limited access to: ?Parks, recreation centers, or sidewalks. ?Healthy food choices, such as grocery stores and farmers' markets. ?What are the signs or symptoms? ?The main sign of this condition is having too much body fat. ?How is this diagnosed? ?This condition is diagnosed based on: ?Your BMI. If you are an adult with a BMI of 30 or higher, you are considered obese. ?Your waist circumference. This measures the  distance around your waistline. ?Your skinfold thickness. Your health care provider may gently pinch a fold of your skin and measure it. ?You may have other tests to check for underlying conditions. ?How is this treated? ?Treatment for this condition often includes changing your lifestyle. Treatment may include some or all of the following: ?Dietary changes. This may include developing a healthy meal plan. ?Regular physical activity. This may include activity that causes your heart to beat faster (aerobic exercise) and strength training. Work with your health care provider to design an exercise program that works for you. ?Medicine to help you lose weight if you are unable to lose one pound a week after six weeks of healthy eating and more physical activity. ?Treating conditions that cause the obesity (underlying conditions). ?Surgery. Surgical options may include gastric banding and gastric bypass. Surgery may be done if: ?Other treatments have not helped to improve your condition. ?You have a BMI of 40 or higher. ?You have life-threatening health problems related to obesity. ?Follow these instructions at home: ?Eating and drinking ? ?Follow recommendations from your health care provider about what you eat and drink. Your health care provider may advise you to: ?Limit fast food, sweets, and processed snack foods. ?Choose low-fat options, such as low-fat milk instead of whole milk. ?Eat five or more servings of fruits or vegetables every day. ?Choose healthy foods when you eat out. ?Keep low-fat snacks available. ?Limit sugary drinks, such as soda, fruit juice, sweetened iced tea, and flavored milk. ?Drink enough water to keep your urine pale yellow. ?Do not follow a fad diet. Fad diets can be unhealthy and even dangerous. ?Other healthful choices include: ?Eat at home more often. This gives you more control over what you eat. ?Learn to read food labels.   This will help you understand how much food is considered one  serving. ?Learn what a healthy serving size is. ?Physical activity ?Exercise regularly, as told by your health care provider. ?Most adults should get up to 150 minutes of moderate-intensity exercise every week. ?Ask your health care provider what types of exercise are safe for you and how often you should exercise. ?Warm up and stretch before being active. ?Cool down and stretch after being active. ?Rest between periods of activity. ?Lifestyle ?Work with your health care provider and a dietitian to set a weight-loss goal that is healthy and reasonable for you. ?Limit your screen time. ?Find ways to reward yourself that do not involve food. ?Do not drink alcohol if: ?Your health care provider tells you not to drink. ?You are pregnant, may be pregnant, or are planning to become pregnant. ?If you drink alcohol: ?Limit how much you have to: ?0-1 drink a day for women. ?0-2 drinks a day for men. ?Know how much alcohol is in your drink. In the [place] one drink equals one 12 oz bottle of beer (355 mL), one 5 oz glass of wine (148 mL), or one 1? oz glass of hard liquor (44 mL). ?General instructions ?Keep a weight-loss journal to keep track of the food you eat and how much exercise you get. ?Take over-the-counter and prescription medicines only as told by your health care provider. ?Take vitamins and supplements only as told by your health care provider. ?Consider joining a support group. Your health care provider may be able to recommend a support group. ?Pay attention to your mental health as obesity can lead to depression or self esteem issues. ?Keep all follow-up visits. This is important. ?Contact a health care provider if: ?You are unable to meet your weight-loss goal after six weeks of dietary and lifestyle changes. ?You have trouble breathing. ?Summary ?Obesity is the condition of having too much total body fat. ?Being overweight or obese means that your weight is greater than what is considered healthy for your body  size. ?Work with your health care provider and a dietitian to set a weight-loss goal that is healthy and reasonable for you. ?Exercise regularly, as told by your health care provider. Ask your health care provider what types of exercise are safe for you and how often you should exercise. ?This information is not intended to replace advice given to you by your health care provider. Make sure you discuss any questions you have with your health care provider. ?Document Revised: 08/31/2020 Document Reviewed: 08/31/2020 ?Elsevier Patient Education ? 2022 [employer]vier Inc. ? ?

## 2020-12-16 NOTE — Assessment & Plan Note (Signed)
Encourage heart healthy diet such as MIND or DASH diet, increase exercise, avoid trans fats, simple carbohydrates and processed foods, consider a krill or fish or flaxseed oil cap daily.  °

## 2020-12-16 NOTE — Progress Notes (Signed)
Subjective:   By signing my name below, I, [name], attest that this documentation has been prepared under the direction and in the presence of Dr. Seabron Spates, DO. 12/16/2020    Patient ID: Sean [name], male    DOB: 06-27-1975, 45 y.o.   MRN: [id number]  Chief Complaint  Patient presents with   Follow-up   Covid Positive    HPI Patient is in today for a office visit.    Covid-19- He reports contracted Covid-19 4-5 weeks ago. He was feeling body aches and fever like symptoms.  Hip pain- He continues having hip pain. He is planning to get a procedure to manage his pain. His surgeon informed him that he needed to lose weight before the procedure. He is not taking saxenda anymore due to being too expensive. He reports doing well while taking it.  Exercise- He does not participate in regular exercise due to hip pain.  Cholesterol- He continues taking 40 mg atorvastatin, 160 mg fenofibrate daily PO and reports no new issues while taking it. He is requesting a refill on them as well.  Blood sugar- He continues taking 500 mg metformin daily PO and reports no new issues while taking it. He is requesting a refill on it as well.  Blood pressure- He continues taking 100 mg metoprolol succinate, 160 mg valsartan daily PO and reports no new issues while taking them. He is requesting a refill on them as well.  Immunizations- He is interested in receiving the flu vaccine during this visit.    Past Medical History:  Diagnosis Date   Heartburn    Hyperlipidemia    Hypertension    Sleep apnea    SOB (shortness of breath) on exertion    Swelling    feet and legs    No past surgical history on file.  Family History  Problem Relation Age of Onset   Heart disease Father 47       MI   Hypertension Father    Sudden death Father        Heart disease   Hyperlipidemia Father    Obesity Father    Diabetes Mother        Borderline   Hyperlipidemia Mother    Hypertension  Mother    Depression Mother    Obesity Mother    Breast cancer Maternal Grandmother     Social History   Socioeconomic History   Marital status: Single    Spouse name: Not on file   Number of children: Not on file   Years of education: Not on file   Highest education level: Not on file  Occupational History   Occupation: Dietitian  Tobacco Use   Smoking status: Never   Smokeless tobacco: Never  Substance and Sexual Activity   Alcohol use: Yes   Drug use: No   Sexual activity: Yes  Other Topics Concern   Not on file  Social History Narrative   Not on file   Social Determinants of Health   Financial Resource Strain: Not on file  Food Insecurity: Not on file  Transportation Needs: Not on file  Physical Activity: Not on file  Stress: Not on file  Social Connections: Not on file  Intimate Partner Violence: Not on file    Outpatient Medications Prior to Visit  Medication Sig Dispense Refill   Ascorbic Acid (VITAMIN C) 1000 MG tablet Take 1,000 mg by mouth daily.     blood glucose meter kit  and supplies KIT Dispense based on patient and insurance preference. Use up to four times daily as directed. (FOR ICD-9 250.00, 250.01). 1 each 0   celecoxib (CELEBREX) 200 MG capsule Take 1 capsule (200 mg total) by mouth 2 (two) times daily as needed. 60 capsule 6   chlorthalidone (HYGROTON) 25 MG tablet Take 1 tablet by mouth once daily 90 tablet 1   cyanocobalamin 1000 MCG tablet Take 1,000 mcg by mouth daily.     meloxicam (MOBIC) 15 MG tablet Take 1 tablet (15 mg total) by mouth daily. 30 tablet 0   Multiple Vitamin (MULTIVITAMIN) capsule Take 1 capsule by mouth daily.     NONFORMULARY OR COMPOUNDED ITEM Compression socks #1  As directed   Dx low ext edema 1 each 0   Vitamin D, Ergocalciferol, (DRISDOL) 1.25 MG (50000 UNIT) CAPS capsule Take 1 capsule by mouth once a week 12 capsule 0   atorvastatin (LIPITOR) 40 MG tablet Take 1 tablet (40 mg total) by mouth daily. 90  tablet 3   fenofibrate 160 MG tablet Take 1 tablet (160 mg total) by mouth daily. 90 tablet 3   metFORMIN (GLUCOPHAGE) 500 MG tablet Take 1 tablet (500 mg total) by mouth 2 (two) times daily with a meal. 180 tablet 1   metoprolol succinate (TOPROL-XL) 100 MG 24 hr tablet TAKE 1 & 1/2 (ONE & ONE-HALF) TABLETS BY MOUTH ONCE DAILY 135 tablet 1   Semaglutide (RYBELSUS) 3 MG TABS Take 1 tablet by mouth daily. 30 tablet 0   valsartan (DIOVAN) 160 MG tablet Take 1 tablet (160 mg total) by mouth daily. 90 tablet 2   COVID-19 mRNA vaccine, Moderna, 100 MCG/0.5ML injection INJECT AS DIRECTED (Patient not taking: Reported on 12/16/2020) .25 mL 0   predniSONE (DELTASONE) 20 MG tablet Take 2 tablets (40 mg total) by mouth daily with breakfast. (Patient not taking: Reported on 12/16/2020) 10 tablet 0   No facility-administered medications prior to visit.    No Known Allergies  Review of Systems  Constitutional:  Negative for fever and malaise/fatigue.  HENT:  Negative for congestion.   Eyes:  Negative for blurred vision.  Respiratory:  Negative for shortness of breath.   Cardiovascular:  Negative for chest pain, palpitations and leg swelling.  Gastrointestinal:  Negative for abdominal pain, blood in stool and nausea.  Genitourinary:  Negative for dysuria and frequency.  Musculoskeletal:  Positive for joint pain (Hip pain) and myalgias. Negative for falls.  Skin:  Negative for rash.  Neurological:  Negative for dizziness, loss of consciousness and headaches.  Endo/Heme/Allergies:  Negative for environmental allergies.  Psychiatric/Behavioral:  Negative for depression. The patient is not nervous/anxious.       Objective:    Physical Exam Vitals and nursing note reviewed.  Constitutional:      General: He is not in acute distress.    Appearance: Normal appearance. He is not ill-appearing.  HENT:     Head: Normocephalic and atraumatic.     Right Ear: External ear normal.     Left Ear: External  ear normal.  Eyes:     Extraocular Movements: Extraocular movements intact.     Pupils: Pupils are equal, round, and reactive to light.  Cardiovascular:     Rate and Rhythm: Normal rate and regular rhythm.     Heart sounds: Normal heart sounds. No murmur heard.   No gallop.  Pulmonary:     Effort: Pulmonary effort is normal. No respiratory distress.     Breath  sounds: Normal breath sounds. No wheezing or rales.  Skin:    General: Skin is warm and dry.  Neurological:     Mental Status: He is alert and oriented to person, place, and time.  Psychiatric:        Behavior: Behavior normal.    BP 134/84 (BP Location: Right Arm, Patient Position: Sitting, Cuff Size: Large)   Pulse 89   Temp 97.8 F (36.6 C) (Oral)   Resp 20   Ht $R'6\' 1"'aQ$  (1.854 m)   Wt (!) 414 lb 9.6 oz (188.1 kg)   SpO2 97%   BMI 54.70 kg/m  Wt Readings from Last 3 Encounters:  12/16/20 (!) 414 lb 9.6 oz (188.1 kg)  12/01/20 (!) 412 lb (186.9 kg)  03/25/20 (!) 428 lb 6.4 oz (194.3 kg)    Diabetic Foot Exam - Simple   No data filed    Lab Results  Component Value Date   WBC 11.6 (H) 07/22/2019   HGB 14.6 07/22/2019   HCT 42.6 07/22/2019   PLT 212.0 07/22/2019   GLUCOSE 85 03/25/2020   CHOL 159 03/25/2020   TRIG 170.0 (H) 03/25/2020   HDL 38.70 (L) 03/25/2020   LDLDIRECT 107.0 08/05/2019   LDLCALC 86 03/25/2020   ALT 33 03/25/2020   AST 21 03/25/2020   NA 140 03/25/2020   K 3.7 03/25/2020   CL 101 03/25/2020   CREATININE 1.18 03/25/2020   BUN 17 03/25/2020   CO2 33 (H) 03/25/2020   TSH 2.740 03/14/2018   HGBA1C 5.7 03/25/2020   MICROALBUR 0.7 03/25/2020    Lab Results  Component Value Date   TSH 2.740 03/14/2018   Lab Results  Component Value Date   WBC 11.6 (H) 07/22/2019   HGB 14.6 07/22/2019   HCT 42.6 07/22/2019   MCV 85.9 07/22/2019   PLT 212.0 07/22/2019   Lab Results  Component Value Date   NA 140 03/25/2020   K 3.7 03/25/2020   CO2 33 (H) 03/25/2020   GLUCOSE 85 03/25/2020    BUN 17 03/25/2020   CREATININE 1.18 03/25/2020   BILITOT 0.7 03/25/2020   ALKPHOS 59 03/25/2020   AST 21 03/25/2020   ALT 33 03/25/2020   PROT 7.0 03/25/2020   ALBUMIN 4.4 03/25/2020   CALCIUM 9.9 03/25/2020   ANIONGAP 11 08/07/2017   GFR 74.66 03/25/2020   Lab Results  Component Value Date   CHOL 159 03/25/2020   Lab Results  Component Value Date   HDL 38.70 (L) 03/25/2020   Lab Results  Component Value Date   LDLCALC 86 03/25/2020   Lab Results  Component Value Date   TRIG 170.0 (H) 03/25/2020   Lab Results  Component Value Date   CHOLHDL 4 03/25/2020   Lab Results  Component Value Date   HGBA1C 5.7 03/25/2020       Assessment & Plan:   Problem List Items Addressed This Visit       Unprioritized   Prediabetes   Relevant Medications   metFORMIN (GLUCOPHAGE) 500 MG tablet   Hip pain    Per ortho and sport med      HTN (hypertension)    Well controlled, no changes to meds. Encouraged heart healthy diet such as the DASH diet and exercise as tolerated.       Relevant Medications   atorvastatin (LIPITOR) 40 MG tablet   fenofibrate 160 MG tablet   metoprolol succinate (TOPROL-XL) 100 MG 24 hr tablet   valsartan (DIOVAN) 160 MG tablet  Other Relevant Orders   Hemoglobin A1c   CBC with Differential/Platelet   Comprehensive metabolic panel   Lipid panel   TSH   Vitamin B12   VITAMIN D 25 Hydroxy (Vit-D Deficiency, Fractures)   Insulin, random   Hyperlipidemia    Encourage heart healthy diet such as MIND or DASH diet, increase exercise, avoid trans fats, simple carbohydrates and processed foods, consider a krill or fish or flaxseed oil cap daily.       Relevant Medications   atorvastatin (LIPITOR) 40 MG tablet   fenofibrate 160 MG tablet   metoprolol succinate (TOPROL-XL) 100 MG 24 hr tablet   valsartan (DIOVAN) 160 MG tablet   Hyperlipidemia associated with type 2 diabetes mellitus (HCC)    Encourage heart healthy diet such as MIND or DASH  diet, increase exercise, avoid trans fats, simple carbohydrates and processed foods, consider a krill or fish or flaxseed oil cap daily.       Relevant Medications   atorvastatin (LIPITOR) 40 MG tablet   fenofibrate 160 MG tablet   metFORMIN (GLUCOPHAGE) 500 MG tablet   metoprolol succinate (TOPROL-XL) 100 MG 24 hr tablet   valsartan (DIOVAN) 160 MG tablet   Other Relevant Orders   Hemoglobin A1c   CBC with Differential/Platelet   Comprehensive metabolic panel   Lipid panel   TSH   Vitamin B12   VITAMIN D 25 Hydroxy (Vit-D Deficiency, Fractures)   Insulin, random   Morbid obesity ([place]) - Primary    Referral to surgery done saxenda sent to pharmacy F/u 1 month con't diet       Relevant Medications   Liraglutide -Weight Management (SAXENDA) 18 MG/3ML SOPN   metFORMIN (GLUCOPHAGE) 500 MG tablet   Other Relevant Orders   Hemoglobin A1c   CBC with Differential/Platelet   Comprehensive metabolic panel   Lipid panel   TSH   Vitamin B12   VITAMIN D 25 Hydroxy (Vit-D Deficiency, Fractures)   Insulin, random   Other Visit Diagnoses     Need for influenza vaccination       Relevant Orders   Flu Vaccine QUAD 15mo+IM (Fluarix, Fluzone & Alfiuria Quad PF) (Completed)   Essential hypertension       Relevant Medications   atorvastatin (LIPITOR) 40 MG tablet   fenofibrate 160 MG tablet   metoprolol succinate (TOPROL-XL) 100 MG 24 hr tablet   valsartan (DIOVAN) 160 MG tablet        Meds ordered this encounter  Medications   Liraglutide -Weight Management (SAXENDA) 18 MG/3ML SOPN    Sig: Inject 3 mg into the skin daily.    Dispense:  3 mL    Refill:  5   atorvastatin (LIPITOR) 40 MG tablet    Sig: Take 1 tablet (40 mg total) by mouth daily.    Dispense:  90 tablet    Refill:  3   fenofibrate 160 MG tablet    Sig: Take 1 tablet (160 mg total) by mouth daily.    Dispense:  90 tablet    Refill:  3   metFORMIN (GLUCOPHAGE) 500 MG tablet    Sig: Take 1 tablet (500 mg  total) by mouth 2 (two) times daily with a meal.    Dispense:  180 tablet    Refill:  1   metoprolol succinate (TOPROL-XL) 100 MG 24 hr tablet    Sig: TAKE 1 & 1/2 (ONE & ONE-HALF) TABLETS BY MOUTH ONCE DAILY    Dispense:  135 tablet  Refill:  1   valsartan (DIOVAN) 160 MG tablet    Sig: Take 1 tablet (160 mg total) by mouth daily.    Dispense:  90 tablet    Refill:  2    I, Dr. Roma Schanz, DO, personally preformed the services described in this documentation.  All medical record entries made by the scribe were at my direction and in my presence.  I have reviewed the chart and discharge instructions (if applicable) and agree that the record reflects my personal performance and is accurate and complete. 12/16/2020   I,[name],acting as a scribe for Ann Held, DO.,have documented all relevant documentation on the behalf of Ann Held, DO,as directed by  Ann Held, DO while in the presence of Ann Held, DO.   Ann Held, DO

## 2020-12-16 NOTE — Assessment & Plan Note (Signed)
Well controlled, no changes to meds. Encouraged heart healthy diet such as the DASH diet and exercise as tolerated.  °

## 2020-12-16 NOTE — Assessment & Plan Note (Signed)
Per ortho and sport med  

## 2020-12-17 ENCOUNTER — Other Ambulatory Visit: Payer: Self-pay | Admitting: Family Medicine

## 2020-12-17 DIAGNOSIS — R7303 Prediabetes: Secondary | ICD-10-CM

## 2020-12-17 LAB — COMPREHENSIVE METABOLIC PANEL
ALT: 27 U/L (ref 0–53)
AST: 23 U/L (ref 0–37)
Albumin: 4.6 g/dL (ref 3.5–5.2)
Alkaline Phosphatase: 59 U/L (ref 39–117)
BUN: 23 mg/dL (ref 6–23)
CO2: 30 mEq/L (ref 19–32)
Calcium: 9.5 mg/dL (ref 8.4–10.5)
Chloride: 101 mEq/L (ref 96–112)
Creatinine, Ser: 1.05 mg/dL (ref 0.40–1.50)
GFR: 85.45 mL/min (ref >60.00)
Glucose, Bld: 84 mg/dL (ref 70–99)
Potassium: 3.6 mEq/L (ref 3.5–5.1)
Sodium: 141 mEq/L (ref 135–145)
Total Bilirubin: 0.6 mg/dL (ref 0.2–1.2)
Total Protein: 7.2 g/dL (ref 6.0–8.3)

## 2020-12-17 LAB — CBC WITH DIFFERENTIAL/PLATELET
Basophils Absolute: 0.1 10*3/uL (ref 0.0–0.1)
Basophils Relative: 1.2 % (ref 0.0–3.0)
Eosinophils Absolute: 0.2 10*3/uL (ref 0.0–0.7)
Eosinophils Relative: 2.1 % (ref 0.0–5.0)
HCT: 43.2 % (ref 39.0–52.0)
Hemoglobin: 14.6 g/dL (ref 13.0–17.0)
Lymphocytes Relative: 26.1 % (ref 12.0–46.0)
Lymphs Abs: 3 10*3/uL (ref 0.7–4.0)
MCHC: 33.9 g/dL (ref 30.0–36.0)
MCV: 85.8 fl (ref 78.0–100.0)
Monocytes Absolute: 1 10*3/uL (ref 0.1–1.0)
Monocytes Relative: 8.3 % (ref 3.0–12.0)
Neutro Abs: 7.1 10*3/uL (ref 1.4–7.7)
Neutrophils Relative %: 62.3 % (ref 43.0–77.0)
Platelets: 281 10*3/uL (ref 150.0–400.0)
RBC: 5.03 Mil/uL (ref 4.22–5.81)
RDW: 13.6 % (ref 11.5–15.5)
WBC: 11.4 10*3/uL — ABNORMAL HIGH (ref 4.0–10.5)

## 2020-12-17 LAB — LIPID PANEL
Cholesterol: 176 mg/dL (ref 0–200)
HDL: 48.3 mg/dL (ref >39.00)
LDL Cholesterol: 105 mg/dL — ABNORMAL HIGH (ref 0–99)
NonHDL: 127.85
Total CHOL/HDL Ratio: 4
Triglycerides: 115 mg/dL (ref 0.0–149.0)
VLDL: 23 mg/dL (ref 0.0–40.0)

## 2020-12-17 LAB — VITAMIN B12: Vitamin B-12: 557 pg/mL (ref 211–911)

## 2020-12-17 LAB — HEMOGLOBIN A1C: Hgb A1c MFr Bld: 6.1 % (ref 4.6–6.5)

## 2020-12-17 LAB — VITAMIN D 25 HYDROXY (VIT D DEFICIENCY, FRACTURES): VITD: 47.93 ng/mL (ref 30.00–100.00)

## 2020-12-17 LAB — TSH: TSH: 2.43 u[IU]/mL (ref 0.35–5.50)

## 2020-12-17 LAB — INSULIN, RANDOM: Insulin: 24.7 u[IU]/mL — ABNORMAL HIGH

## 2020-12-17 MED ORDER — TIRZEPATIDE 2.5 MG/0.5ML ~~LOC~~ SOAJ: 2.5000 mg | SUBCUTANEOUS | 0 refills | Status: DC

## 2020-12-18 ENCOUNTER — Other Ambulatory Visit: Payer: Self-pay | Admitting: Family Medicine

## 2020-12-18 DIAGNOSIS — I10 Essential (primary) hypertension: Secondary | ICD-10-CM

## 2020-12-22 ENCOUNTER — Encounter: Payer: BC Managed Care – PPO | Admitting: Physical Therapy

## 2021-01-12 NOTE — Progress Notes (Signed)
[place] Phone: [phone] Subjective:   Sean Sean [name], am serving as a scribe for Dr. Hulan [name].  This visit occurred during the SARS-CoV-2 public health emergency.  Safety protocols were in place, including screening questions prior to the visit, additional usage of staff PPE, and extensive cleaning of exam room while observing appropriate contact time as indicated for disinfecting solutions.   I'm seeing this patient by the request  of:  Sean Held, DO  CC: Right hip pain follow-up  [id number]  12/01/2020 Discussed with patient that I do feel that severe obesity is causing most of his difficulty with his health as well as with his right hip.  Patient likely will need a right hip replacement in the near future but unfortunately BMI is too high.  We need to get patient's BMI under 40.  Do feel that the best way to potentially cause or help this because would be gastric bypass surgery and will refer for further evaluation at this time.  Updated 01/13/2021 Sean Sean [name] is a 45 y.o. male coming in with complaint of R hip pain. Patient states that his hip is not any better today. Still having pain with putting on socks and with standing for long periods of time. Using supplements. Using meloxicam at night with Tylenol.  Patient states that because he has lost weight may be the hip is feeling fairly good.  Has not noticed any significant improvement in range of motion.  Taking mounjaro injection. Has lost 17# so far.   Xray IMPRESSION: Mild to moderate asymmetric right hip degenerative arthritis.       Past Medical History:  Diagnosis Date   Heartburn    Hyperlipidemia    Hypertension    Sleep apnea    SOB (shortness of breath) on exertion    Swelling    feet and legs   Sean [name]. Social History   Socioeconomic History   Marital status: Single    Spouse name:  Not on file   Number of children: Not on file   Years of education: Not on file   Highest education level: Not on file  Occupational History   Occupation: Chief Executive Officer  Tobacco Use   Smoking status: Never   Smokeless tobacco: Never  Substance and Sexual Activity   Alcohol use: Yes   Drug use: Sean [name]   Sexual activity: Yes  Other Topics Concern   Not on file  Social History Narrative   Not on file   Social Determinants of Health   Financial Resource Strain: Not on file  Food Insecurity: Not on file  Transportation Needs: Not on file  Physical Activity: Not on file  Stress: Not on file  Social Connections: Not on file   Sean [name] Family History  Problem Relation Age of Onset   Heart disease Father 14       MI   Hypertension Father    Sudden death Father        Heart disease   Hyperlipidemia Father    Obesity Father    Diabetes Mother        Borderline   Hyperlipidemia Mother    Hypertension Mother    Depression Mother    Obesity Mother    Breast cancer Maternal Grandmother     Current Outpatient Medications (Endocrine & Metabolic):    metFORMIN (GLUCOPHAGE) 500 MG tablet, Take 1 tablet (  500 mg total) by mouth 2 (two) times daily with a meal.   tirzepatide (MOUNJARO) 5 MG/0.5ML Pen, Inject 5 mg into the skin once a week.  Current Outpatient Medications (Cardiovascular):    atorvastatin (LIPITOR) 40 MG tablet, Take 1 tablet (40 mg total) by mouth daily.   chlorthalidone (HYGROTON) 25 MG tablet, Take 1 tablet by mouth once daily   fenofibrate 160 MG tablet, Take 1 tablet (160 mg total) by mouth daily.   metoprolol succinate (TOPROL-XL) 100 MG 24 hr tablet, TAKE 1 & 1/2 (ONE & ONE-HALF) TABLETS BY MOUTH ONCE DAILY   valsartan (DIOVAN) 160 MG tablet, Take 1 tablet (160 mg total) by mouth daily.   Current Outpatient Medications (Analgesics):    celecoxib (CELEBREX) 200 MG capsule, Take 1 capsule (200 mg total) by mouth 2 (two) times daily as  needed.   meloxicam (MOBIC) 15 MG tablet, Take 1 tablet (15 mg total) by mouth daily.  Current Outpatient Medications (Hematological):    cyanocobalamin 1000 MCG tablet, Take 1,000 mcg by mouth daily.  Current Outpatient Medications (Other):    Ascorbic Acid (VITAMIN C) 1000 MG tablet, Take 1,000 mg by mouth daily.   blood glucose meter kit and supplies KIT, Dispense based on patient and insurance preference. Use up to four times daily as directed. (FOR ICD-9 250.00, 250.01).   Multiple Vitamin (MULTIVITAMIN) capsule, Take 1 capsule by mouth daily.   NONFORMULARY OR COMPOUNDED ITEM, Compression socks #1  As directed   Dx low ext edema   Vitamin D, Ergocalciferol, (DRISDOL) 1.25 MG (50000 UNIT) CAPS capsule, Take 1 capsule by mouth once a week   Reviewed prior external information including notes and imaging from  primary care provider As well as notes that were available from care everywhere and other healthcare systems.  Past medical history, social, surgical and family history all reviewed in electronic medical record.  Sean [name].   Review of Systems:  Sean [name], visual changes, nausea, vomiting, diarrhea, constipation, dizziness, abdominal pain, skin rash, fevers, chills, night sweats, weight loss, swollen lymph nodes, body aches, joint swelling, chest pain, shortness of breath, mood changes. POSITIVE muscle aches  Objective  Blood pressure 124/86, pulse 84, height $RemoveBe'6\' 1"'IdQYcelnu$  (1.854 m), weight (!) 398 lb (180.5 kg), SpO2 97 %.   General: Sean [name], dressed appropriately.  Morbidly obese HEENT: Pupils equal, extraocular movements intact  Respiratory: Patient's speak in full sentences and does not appear short of breath  Cardiovascular: Sean [name], non tender, Sean [name]  Gait antalgic Patient's right hip does have decreased range of motion in internal and  external range of motion.  Sean [name] pain in the groin with internal range of motion.  Negative straight leg test.  Some decreased range of motion secondary to body habitus.  Negative straight leg test   Impression and Recommendations:     The above documentation has been reviewed and is accurate and complete Lyndal Pulley, DO

## 2021-01-13 ENCOUNTER — Other Ambulatory Visit: Payer: Self-pay

## 2021-01-13 ENCOUNTER — Encounter: Payer: Self-pay | Admitting: Family Medicine

## 2021-01-13 ENCOUNTER — Ambulatory Visit: Payer: BC Managed Care – PPO | Admitting: Family Medicine

## 2021-01-13 ENCOUNTER — Ambulatory Visit: Payer: Self-pay

## 2021-01-13 VITALS — BP 124/86 | HR 84 | Ht 73.0 in | Wt 398.0 lb

## 2021-01-13 DIAGNOSIS — E1165 Type 2 diabetes mellitus with hyperglycemia: Secondary | ICD-10-CM

## 2021-01-13 DIAGNOSIS — E1169 Type 2 diabetes mellitus with other specified complication: Secondary | ICD-10-CM

## 2021-01-13 DIAGNOSIS — M1611 Unilateral primary osteoarthritis, right hip: Secondary | ICD-10-CM | POA: Diagnosis not present

## 2021-01-13 DIAGNOSIS — Z6841 Body Mass Index (BMI) 40.0 and over, adult: Secondary | ICD-10-CM | POA: Diagnosis not present

## 2021-01-13 DIAGNOSIS — M25551 Pain in right hip: Secondary | ICD-10-CM

## 2021-01-13 MED ORDER — TIRZEPATIDE 5 MG/0.5ML ~~LOC~~ SOAJ: 5.0000 mg | SUBCUTANEOUS | 2 refills | Status: DC

## 2021-01-13 MED ORDER — MELOXICAM 15 MG PO TABS: 15.0000 mg | ORAL_TABLET | Freq: Every day | ORAL | 0 refills | Status: DC

## 2021-01-13 NOTE — Assessment & Plan Note (Signed)
Increase mounjaro to 5 mg and f/u 1 month Labs in Jan

## 2021-01-13 NOTE — Progress Notes (Signed)
Subjective:   By signing my name below, I, [name], attest that this documentation has been prepared under the direction and in the presence of Dr. Seabron Spates, DO. 01/13/2021    Patient ID: Sean [name], male    DOB: Feb 08, 1975, 45 y.o.   MRN: [id number]  Chief Complaint  Patient presents with   Obesity   Follow-up    HPI Patient is in today for a office visit.   He has lost weight since his last visit. He continues taking mounjaro to manage his appetite and reports his appetite is reduced. He ran out of mounjaro last Friday and is requesting a refill on it.   Wt Readings from Last 3 Encounters:  01/13/21 (!) 397 lb 9.6 oz (180.4 kg)  12/16/20 (!) 414 lb 9.6 oz (188.1 kg)  12/01/20 (!) 412 lb (186.9 kg)   His blood pressure is doing well during this visit. He stopped taking 150 mg metoprolol succinate daily PO and reports no new issues while taking it.   BP Readings from Last 3 Encounters:  01/13/21 130/80  12/16/20 134/84  12/01/20 (!) 130/98   Pulse Readings from Last 3 Encounters:  01/13/21 86  12/16/20 89  12/01/20 72    Past Medical History:  Diagnosis Date   Heartburn    Hyperlipidemia    Hypertension    Sleep apnea    SOB (shortness of breath) on exertion    Swelling    feet and legs    No past surgical history on file.  Family History  Problem Relation Age of Onset   Heart disease Father 50       MI   Hypertension Father    Sudden death Father        Heart disease   Hyperlipidemia Father    Obesity Father    Diabetes Mother        Borderline   Hyperlipidemia Mother    Hypertension Mother    Depression Mother    Obesity Mother    Breast cancer Maternal Grandmother     Social History   Socioeconomic History   Marital status: Single    Spouse name: Not on file   Number of children: Not on file   Years of education: Not on file   Highest education level: Not on file  Occupational History   Occupation: Clinical cytogeneticist  Tobacco Use   Smoking status: Never   Smokeless tobacco: Never  Substance and Sexual Activity   Alcohol use: Yes   Drug use: No   Sexual activity: Yes  Other Topics Concern   Not on file  Social History Narrative   Not on file   Social Determinants of Health   Financial Resource Strain: Not on file  Food Insecurity: Not on file  Transportation Needs: Not on file  Physical Activity: Not on file  Stress: Not on file  Social Connections: Not on file  Intimate Partner Violence: Not on file    Outpatient Medications Prior to Visit  Medication Sig Dispense Refill   Ascorbic Acid (VITAMIN C) 1000 MG tablet Take 1,000 mg by mouth daily.     atorvastatin (LIPITOR) 40 MG tablet Take 1 tablet (40 mg total) by mouth daily. 90 tablet 3   blood glucose meter kit and supplies KIT Dispense based on patient and insurance preference. Use up to four times daily as directed. (FOR ICD-9 250.00, 250.01). 1 each 0   celecoxib (CELEBREX) 200 MG capsule Take 1  capsule (200 mg total) by mouth 2 (two) times daily as needed. 60 capsule 6   chlorthalidone (HYGROTON) 25 MG tablet Take 1 tablet by mouth once daily 90 tablet 1   cyanocobalamin 1000 MCG tablet Take 1,000 mcg by mouth daily.     fenofibrate 160 MG tablet Take 1 tablet (160 mg total) by mouth daily. 90 tablet 3   meloxicam (MOBIC) 15 MG tablet Take 1 tablet (15 mg total) by mouth daily. 30 tablet 0   metFORMIN (GLUCOPHAGE) 500 MG tablet Take 1 tablet (500 mg total) by mouth 2 (two) times daily with a meal. 180 tablet 1   metoprolol succinate (TOPROL-XL) 100 MG 24 hr tablet TAKE 1 & 1/2 (ONE & ONE-HALF) TABLETS BY MOUTH ONCE DAILY 135 tablet 1   Multiple Vitamin (MULTIVITAMIN) capsule Take 1 capsule by mouth daily.     NONFORMULARY OR COMPOUNDED ITEM Compression socks #1  As directed   Dx low ext edema 1 each 0   valsartan (DIOVAN) 160 MG tablet Take 1 tablet (160 mg total) by mouth daily. 90 tablet 2   Vitamin D, Ergocalciferol,  (DRISDOL) 1.25 MG (50000 UNIT) CAPS capsule Take 1 capsule by mouth once a week 12 capsule 0   tirzepatide (MOUNJARO) 2.5 MG/0.5ML Pen Inject 2.5 mg into the skin once a week. 2 mL 0   No facility-administered medications prior to visit.    No Known Allergies  Review of Systems  Constitutional:  Positive for weight loss. Negative for fever and malaise/fatigue.  HENT:  Negative for congestion.   Eyes:  Negative for blurred vision.  Respiratory:  Negative for shortness of breath.   Cardiovascular:  Negative for chest pain, palpitations and leg swelling.  Gastrointestinal:  Negative for abdominal pain, blood in stool and nausea.  Genitourinary:  Negative for dysuria and frequency.  Musculoskeletal:  Negative for falls.  Skin:  Negative for rash.  Neurological:  Negative for dizziness, loss of consciousness and headaches.  Endo/Heme/Allergies:  Negative for environmental allergies.  Psychiatric/Behavioral:  Negative for depression. The patient is not nervous/anxious.       Objective:    Physical Exam Vitals and nursing note reviewed.  Constitutional:      General: He is not in acute distress.    Appearance: Normal appearance. He is not ill-appearing.  HENT:     Head: Normocephalic and atraumatic.     Right Ear: External ear normal.     Left Ear: External ear normal.  Eyes:     Extraocular Movements: Extraocular movements intact.     Pupils: Pupils are equal, round, and reactive to light.  Cardiovascular:     Rate and Rhythm: Normal rate and regular rhythm.     Heart sounds: Normal heart sounds. No murmur heard.   No gallop.  Pulmonary:     Effort: Pulmonary effort is normal. No respiratory distress.     Breath sounds: Normal breath sounds. No wheezing or rales.  Skin:    General: Skin is warm and dry.  Neurological:     Mental Status: He is alert and oriented to person, place, and time.  Psychiatric:        Behavior: Behavior normal.        Judgment: Judgment normal.     BP 130/80 (BP Location: Right Arm, Patient Position: Sitting, Cuff Size: Large)   Pulse 86   Temp 98.1 F (36.7 C) (Oral)   Resp 20   Ht $R'6\' 1"'eL$  (1.854 m)   Wt (!) 397  lb 9.6 oz (180.4 kg)   SpO2 99%   BMI 52.46 kg/m  Wt Readings from Last 3 Encounters:  01/13/21 (!) 397 lb 9.6 oz (180.4 kg)  12/16/20 (!) 414 lb 9.6 oz (188.1 kg)  12/01/20 (!) 412 lb (186.9 kg)    Diabetic Foot Exam - Simple   No data filed    Lab Results  Component Value Date   WBC 11.4 (H) 12/16/2020   HGB 14.6 12/16/2020   HCT 43.2 12/16/2020   PLT 281.0 12/16/2020   GLUCOSE 84 12/16/2020   CHOL 176 12/16/2020   TRIG 115.0 12/16/2020   HDL 48.30 12/16/2020   LDLDIRECT 107.0 08/05/2019   LDLCALC 105 (H) 12/16/2020   ALT 27 12/16/2020   AST 23 12/16/2020   NA 141 12/16/2020   K 3.6 12/16/2020   CL 101 12/16/2020   CREATININE 1.05 12/16/2020   BUN 23 12/16/2020   CO2 30 12/16/2020   TSH 2.43 12/16/2020   HGBA1C 6.1 12/16/2020   MICROALBUR 0.7 03/25/2020    Lab Results  Component Value Date   TSH 2.43 12/16/2020   Lab Results  Component Value Date   WBC 11.4 (H) 12/16/2020   HGB 14.6 12/16/2020   HCT 43.2 12/16/2020   MCV 85.8 12/16/2020   PLT 281.0 12/16/2020   Lab Results  Component Value Date   NA 141 12/16/2020   K 3.6 12/16/2020   CO2 30 12/16/2020   GLUCOSE 84 12/16/2020   BUN 23 12/16/2020   CREATININE 1.05 12/16/2020   BILITOT 0.6 12/16/2020   ALKPHOS 59 12/16/2020   AST 23 12/16/2020   ALT 27 12/16/2020   PROT 7.2 12/16/2020   ALBUMIN 4.6 12/16/2020   CALCIUM 9.5 12/16/2020   ANIONGAP 11 08/07/2017   GFR 85.45 12/16/2020   Lab Results  Component Value Date   CHOL 176 12/16/2020   Lab Results  Component Value Date   HDL 48.30 12/16/2020   Lab Results  Component Value Date   LDLCALC 105 (H) 12/16/2020   Lab Results  Component Value Date   TRIG 115.0 12/16/2020   Lab Results  Component Value Date   CHOLHDL 4 12/16/2020   Lab Results  Component  Value Date   HGBA1C 6.1 12/16/2020       Assessment & Plan:   Problem List Items Addressed This Visit       Unprioritized   Morbid obesity ([place]) - Primary   Relevant Medications   tirzepatide (MOUNJARO) 5 MG/0.5ML Pen   Other Visit Diagnoses     Type 2 diabetes mellitus with morbid obesity ([place])       Relevant Medications   tirzepatide (MOUNJARO) 5 MG/0.5ML Pen        Meds ordered this encounter  Medications   DISCONTD: tirzepatide (MOUNJARO) 5 MG/0.5ML Pen    Sig: Inject 5 mg into the skin once a week.    Dispense:  6.42 mL    Refill:  2   tirzepatide (MOUNJARO) 5 MG/0.5ML Pen    Sig: Inject 5 mg into the skin once a week.    Dispense:  6.42 mL    Refill:  2    I, Dr. Roma Schanz, DO, personally preformed the services described in this documentation.  All medical record entries made by the scribe were at my direction and in my presence.  I have reviewed the chart and discharge instructions (if applicable) and agree that the record reflects my personal performance and is accurate and complete. 01/13/2021  I,[name],acting as a Education administrator for [employer], DO.,have documented all relevant documentation on the behalf of Ann Held, DO,as directed by  Ann Held, DO while in the presence of Ann Held, DO.   Ann Held, DO

## 2021-01-13 NOTE — Assessment & Plan Note (Signed)
Increase mounjaro to 5 mg  rto 1 month

## 2021-01-13 NOTE — Patient Instructions (Signed)
Obesity, Adult ?Obesity is the condition of having too much total body fat. Being overweight or obese means that your weight is greater than what is considered healthy for your body size. Obesity is determined by a measurement called BMI (body mass index). BMI is an estimate of body fat and is calculated from height and weight. For adults, a BMI of 30 or higher is considered obese. ?Obesity can lead to other health concerns and major illnesses, including: ?Stroke. ?Coronary artery disease (CAD). ?Type 2 diabetes. ?Some types of cancer, including cancers of the colon, breast, uterus, and gallbladder. ?High blood pressure (hypertension). ?High cholesterol. ?Gallbladder stones. ?Obesity can also contribute to: ?Osteoarthritis. ?Sleep apnea. ?Infertility problems. ?What are the causes? ?Common causes of this condition include: ?Eating daily meals that are high in calories, sugar, and fat. ?Drinking high amounts of sugar-sweetened beverages, such as soft drinks. ?Being born with genes that may make you more likely to become obese. ?Having a medical condition that causes obesity, including: ?Hypothyroidism. ?Polycystic ovarian syndrome (PCOS). ?Binge-eating disorder. ?Cushing syndrome. ?Taking certain medicines, such as steroids, antidepressants, and seizure medicines. ?Not being physically active (sedentary lifestyle). ?Not getting enough sleep. ?What increases the risk? ?The following factors may make you more likely to develop this condition: ?Having a family history of obesity. ?Living in an area with limited access to: ?Parks, recreation centers, or sidewalks. ?Healthy food choices, such as grocery stores and farmers' markets. ?What are the signs or symptoms? ?The main sign of this condition is having too much body fat. ?How is this diagnosed? ?This condition is diagnosed based on: ?Your BMI. If you are an adult with a BMI of 30 or higher, you are considered obese. ?Your waist circumference. This measures the  distance around your waistline. ?Your skinfold thickness. Your health care provider may gently pinch a fold of your skin and measure it. ?You may have other tests to check for underlying conditions. ?How is this treated? ?Treatment for this condition often includes changing your lifestyle. Treatment may include some or all of the following: ?Dietary changes. This may include developing a healthy meal plan. ?Regular physical activity. This may include activity that causes your heart to beat faster (aerobic exercise) and strength training. Work with your health care provider to design an exercise program that works for you. ?Medicine to help you lose weight if you are unable to lose one pound a week after six weeks of healthy eating and more physical activity. ?Treating conditions that cause the obesity (underlying conditions). ?Surgery. Surgical options may include gastric banding and gastric bypass. Surgery may be done if: ?Other treatments have not helped to improve your condition. ?You have a BMI of 40 or higher. ?You have life-threatening health problems related to obesity. ?Follow these instructions at home: ?Eating and drinking ? ?Follow recommendations from your health care provider about what you eat and drink. Your health care provider may advise you to: ?Limit fast food, sweets, and processed snack foods. ?Choose low-fat options, such as low-fat milk instead of whole milk. ?Eat five or more servings of fruits or vegetables every day. ?Choose healthy foods when you eat out. ?Keep low-fat snacks available. ?Limit sugary drinks, such as soda, fruit juice, sweetened iced tea, and flavored milk. ?Drink enough water to keep your urine pale yellow. ?Do not follow a fad diet. Fad diets can be unhealthy and even dangerous. ?Other healthful choices include: ?Eat at home more often. This gives you more control over what you eat. ?Learn to read food labels.   This will help you understand how much food is considered one  serving. ?Learn what a healthy serving size is. ?Physical activity ?Exercise regularly, as told by your health care provider. ?Most adults should get up to 150 minutes of moderate-intensity exercise every week. ?Ask your health care provider what types of exercise are safe for you and how often you should exercise. ?Warm up and stretch before being active. ?Cool down and stretch after being active. ?Rest between periods of activity. ?Lifestyle ?Work with your health care provider and a dietitian to set a weight-loss goal that is healthy and reasonable for you. ?Limit your screen time. ?Find ways to reward yourself that do not involve food. ?Do not drink alcohol if: ?Your health care provider tells you not to drink. ?You are pregnant, may be pregnant, or are planning to become pregnant. ?If you drink alcohol: ?Limit how much you have to: ?0-1 drink a day for women. ?0-2 drinks a day for men. ?Know how much alcohol is in your drink. In the [place] one drink equals one 12 oz bottle of beer (355 mL), one 5 oz glass of wine (148 mL), or one 1? oz glass of hard liquor (44 mL). ?General instructions ?Keep a weight-loss journal to keep track of the food you eat and how much exercise you get. ?Take over-the-counter and prescription medicines only as told by your health care provider. ?Take vitamins and supplements only as told by your health care provider. ?Consider joining a support group. Your health care provider may be able to recommend a support group. ?Pay attention to your mental health as obesity can lead to depression or self esteem issues. ?Keep all follow-up visits. This is important. ?Contact a health care provider if: ?You are unable to meet your weight-loss goal after six weeks of dietary and lifestyle changes. ?You have trouble breathing. ?Summary ?Obesity is the condition of having too much total body fat. ?Being overweight or obese means that your weight is greater than what is considered healthy for your body  size. ?Work with your health care provider and a dietitian to set a weight-loss goal that is healthy and reasonable for you. ?Exercise regularly, as told by your health care provider. Ask your health care provider what types of exercise are safe for you and how often you should exercise. ?This information is not intended to replace advice given to you by your health care provider. Make sure you discuss any questions you have with your health care provider. ?Document Revised: 08/31/2020 Document Reviewed: 08/31/2020 ?Elsevier Patient Education ? 2022 [employer]vier Inc. ? ?

## 2021-01-13 NOTE — Patient Instructions (Addendum)
Congrats on weight loss Mel in 5 days burst Can consider aqua therapy or injections if things get worse See you again in 2-3 months

## 2021-01-16 DIAGNOSIS — M1611 Unilateral primary osteoarthritis, right hip: Secondary | ICD-10-CM | POA: Insufficient documentation

## 2021-01-16 NOTE — Assessment & Plan Note (Signed)
Patient has lost weight and I commended him to continue.  Patient is very motivated.  Patient is considering the gastric bypass surgery as well but would like to try to do it as natural as possible.  Patient is done very well with his primary care and the medications.  I am optimistic that patient can continue to lose weight.  Patient does need to get a BMI under 40 to be a potential candidate for the hip replacement if necessary.  Patient is going to continue to make these different changes.  Continue the vitamin D supplementation.  Follow-up with me again in 2 to 3 months.

## 2021-02-10 ENCOUNTER — Ambulatory Visit: Payer: BC Managed Care – PPO | Admitting: Family Medicine

## 2021-02-10 ENCOUNTER — Encounter: Payer: Self-pay | Admitting: Family Medicine

## 2021-02-10 DIAGNOSIS — E559 Vitamin D deficiency, unspecified: Secondary | ICD-10-CM

## 2021-02-10 DIAGNOSIS — E119 Type 2 diabetes mellitus without complications: Secondary | ICD-10-CM

## 2021-02-10 DIAGNOSIS — I10 Essential (primary) hypertension: Secondary | ICD-10-CM

## 2021-02-10 DIAGNOSIS — M5441 Lumbago with sciatica, right side: Secondary | ICD-10-CM

## 2021-02-10 MED ORDER — TIRZEPATIDE 7.5 MG/0.5ML ~~LOC~~ SOAJ: 7.5000 mg | SUBCUTANEOUS | 2 refills | Status: DC

## 2021-02-10 MED ORDER — CHLORTHALIDONE 25 MG PO TABS: 25.0000 mg | ORAL_TABLET | Freq: Every day | ORAL | 1 refills | Status: DC

## 2021-02-10 MED ORDER — TRAMADOL HCL 50 MG PO TABS: 50.0000 mg | ORAL_TABLET | Freq: Three times a day (TID) | ORAL | 0 refills | Status: AC | PRN

## 2021-02-10 MED ORDER — CYCLOBENZAPRINE HCL 10 MG PO TABS
10.0000 mg | ORAL_TABLET | Freq: Three times a day (TID) | ORAL | 0 refills | Status: DC | PRN
Start: 2021-02-10 — End: 2021-07-05

## 2021-02-10 MED ORDER — VITAMIN D (ERGOCALCIFEROL) 1.25 MG (50000 UNIT) PO CAPS: 50000.0000 [IU] | ORAL_CAPSULE | ORAL | 0 refills | Status: DC

## 2021-02-10 MED ORDER — PREDNISONE 10 MG PO TABS: ORAL_TABLET | ORAL | 0 refills | Status: DC

## 2021-02-10 NOTE — Progress Notes (Signed)
° °Established Patient Office Visit ° °Subjective:  °Patient ID: Sean [name], male    DOB: 12/23/1975  Age: 45 y.o. MRN: [id number] ° °CC:  °Chief Complaint  °Patient presents with  ° Weight Check  ° Back Pain  °  Pt states pulling a muscle on the right. Pt states feeling a spasm while putting on work boots and felt spasm on right side.   ° ° °HPI °Sean [name] presents for f/u weight -  he also c/o back pain --  he was going hunting last weekend and turned funny and felt a pop in his back ----  pain is in the right side of his back and goes down R leg  ° °Past Medical History:  °Diagnosis Date  ° Heartburn   ° Hyperlipidemia   ° Hypertension   ° Sleep apnea   ° SOB (shortness of breath) on exertion   ° Swelling   ° feet and legs  ° ° °No past surgical history on file. ° °Family History  °Problem Relation Age of Onset  ° Heart disease Father 56  °     MI  ° Hypertension Father   ° Sudden death Father   °     Heart disease  ° Hyperlipidemia Father   ° Obesity Father   ° Diabetes Mother   °     Borderline  ° Hyperlipidemia Mother   ° Hypertension Mother   ° Depression Mother   ° Obesity Mother   ° Breast cancer Maternal Grandmother   ° ° °Social History  ° °Socioeconomic History  ° Marital status: Single  °  Spouse name: Not on file  ° Number of children: Not on file  ° Years of education: Not on file  ° Highest education level: Not on file  °Occupational History  ° Occupation: Dining Services Manager  °Tobacco Use  ° Smoking status: Never  ° Smokeless tobacco: Never  °Substance and Sexual Activity  ° Alcohol use: Yes  ° Drug use: No  ° Sexual activity: Yes  °Other Topics Concern  ° Not on file  °Social History Narrative  ° Not on file  ° °Social Determinants of Health  ° °Financial Resource Strain: Not on file  °Food Insecurity: Not on file  °Transportation Needs: Not on file  °Physical Activity: Not on file  °Stress: Not on file  °Social Connections: Not on file  °Intimate Partner Violence: Not on file   ° ° °Outpatient Medications Prior to Visit  °Medication Sig Dispense Refill  ° Ascorbic Acid (VITAMIN C) 1000 MG tablet Take 1,000 mg by mouth daily.    ° atorvastatin (LIPITOR) 40 MG tablet Take 1 tablet (40 mg total) by mouth daily. 90 tablet 3  ° blood glucose meter kit and supplies KIT Dispense based on patient and insurance preference. Use up to four times daily as directed. (FOR ICD-9 250.00, 250.01). 1 each 0  ° celecoxib (CELEBREX) 200 MG capsule Take 1 capsule (200 mg total) by mouth 2 (two) times daily as needed. 60 capsule 6  ° cyanocobalamin 1000 MCG tablet Take 1,000 mcg by mouth daily.    ° fenofibrate 160 MG tablet Take 1 tablet (160 mg total) by mouth daily. 90 tablet 3  ° meloxicam (MOBIC) 15 MG tablet Take 1 tablet (15 mg total) by mouth daily. 30 tablet 0  ° metFORMIN (GLUCOPHAGE) 500 MG tablet Take 1 tablet (500 mg total) by mouth 2 (two) times daily with a meal. 180 tablet   1  ° metoprolol succinate (TOPROL-XL) 100 MG 24 hr tablet TAKE 1 & 1/2 (ONE & ONE-HALF) TABLETS BY MOUTH ONCE DAILY 135 tablet 1  ° Multiple Vitamin (MULTIVITAMIN) capsule Take 1 capsule by mouth daily.    ° NONFORMULARY OR COMPOUNDED ITEM Compression socks #1  As directed   Dx low ext edema 1 each 0  ° valsartan (DIOVAN) 160 MG tablet Take 1 tablet (160 mg total) by mouth daily. 90 tablet 2  ° chlorthalidone (HYGROTON) 25 MG tablet Take 1 tablet by mouth once daily 90 tablet 1  ° tirzepatide (MOUNJARO) 5 MG/0.5ML Pen Inject 5 mg into the skin once a week. 6.42 mL 2  ° Vitamin D, Ergocalciferol, (DRISDOL) 1.25 MG (50000 UNIT) CAPS capsule Take 1 capsule by mouth once a week 12 capsule 0  ° °No facility-administered medications prior to visit.  ° ° °No Known Allergies ° °ROS °Review of Systems  °Constitutional: Negative.   °HENT:  Negative for congestion, ear pain, hearing loss, nosebleeds, postnasal drip, rhinorrhea, sinus pressure, sneezing and tinnitus.   °Eyes:  Negative for photophobia, discharge, itching and visual  disturbance.  °Respiratory: Negative.    °Cardiovascular: Negative.   °Gastrointestinal:  Negative for abdominal distention, abdominal pain, anal bleeding, blood in stool and constipation.  °Endocrine: Negative.   °Genitourinary: Negative.   °Musculoskeletal: Negative.   °Skin: Negative.   °Allergic/Immunologic: Negative.   °Neurological:  Negative for dizziness, weakness, light-headedness, numbness and headaches.  °Psychiatric/Behavioral:  Negative for agitation, confusion, decreased concentration, dysphoric mood, sleep disturbance and suicidal ideas. The patient is not nervous/anxious.   ° °  °Objective:  °  °Physical Exam °Vitals and nursing note reviewed.  °Constitutional:   °   Appearance: He is well-developed.  °HENT:  °   Head: Normocephalic and atraumatic.  °Eyes:  °   Pupils: Pupils are equal, round, and reactive to light.  °Neck:  °   Thyroid: No thyromegaly.  °Cardiovascular:  °   Rate and Rhythm: Normal rate and regular rhythm.  °   Heart sounds: No murmur heard. °Pulmonary:  °   Effort: Pulmonary effort is normal. No respiratory distress.  °   Breath sounds: Normal breath sounds. No wheezing or rales.  °Chest:  °   Chest wall: No tenderness.  °Musculoskeletal:     °   General: No tenderness.  °   Cervical back: Normal range of motion and neck supple.  °Skin: °   General: Skin is warm and dry.  °Neurological:  °   Mental Status: He is alert and oriented to person, place, and time.  °Psychiatric:     °   Behavior: Behavior normal.     °   Thought Content: Thought content normal.     °   Judgment: Judgment normal.  ° ° °BP 120/80 (BP Location: Left Arm, Patient Position: Sitting, Cuff Size: Large)    Pulse (!) 101    Temp 98.7 °F (37.1 °C) (Oral)    Resp 20    Ht 6' 1" (1.854 m)    Wt (!) 384 lb 3.2 oz (174.3 kg)    SpO2 97%    BMI 50.69 kg/m²  °Wt Readings from Last 3 Encounters:  °02/10/21 (!) 384 lb 3.2 oz (174.3 kg)  °01/13/21 (!) 398 lb (180.5 kg)  °01/13/21 (!) 397 lb 9.6 oz (180.4 kg)  ° ° ° °Health  Maintenance Due  °Topic Date Due  ° COVID-19 Vaccine (1) 06/28/1975  ° OPHTHALMOLOGY EXAM  Never   done  ° HIV Screening  Never done  ° COLONOSCOPY (Pts 45-49yrs Insurance coverage will need to be confirmed)  Never done  ° Pneumococcal Vaccine 19-64 Years old (2 - PCV) 03/25/2021  ° ° °There are no preventive care reminders to display for this patient. ° °Lab Results  °Component Value Date  ° TSH 2.43 12/16/2020  ° °Lab Results  °Component Value Date  ° WBC 11.4 (H) 12/16/2020  ° HGB 14.6 12/16/2020  ° HCT 43.2 12/16/2020  ° MCV 85.8 12/16/2020  ° PLT 281.0 12/16/2020  ° °Lab Results  °Component Value Date  ° NA 141 12/16/2020  ° K 3.6 12/16/2020  ° CO2 30 12/16/2020  ° GLUCOSE 84 12/16/2020  ° BUN 23 12/16/2020  ° CREATININE 1.05 12/16/2020  ° BILITOT 0.6 12/16/2020  ° ALKPHOS 59 12/16/2020  ° AST 23 12/16/2020  ° ALT 27 12/16/2020  ° PROT 7.2 12/16/2020  ° ALBUMIN 4.6 12/16/2020  ° CALCIUM 9.5 12/16/2020  ° ANIONGAP 11 08/07/2017  ° GFR 85.45 12/16/2020  ° °Lab Results  °Component Value Date  ° CHOL 176 12/16/2020  ° °Lab Results  °Component Value Date  ° HDL 48.30 12/16/2020  ° °Lab Results  °Component Value Date  ° LDLCALC 105 (H) 12/16/2020  ° °Lab Results  °Component Value Date  ° TRIG 115.0 12/16/2020  ° °Lab Results  °Component Value Date  ° CHOLHDL 4 12/16/2020  ° °Lab Results  °Component Value Date  ° HGBA1C 6.1 12/16/2020  ° ° °  °Assessment & Plan:  ° °Problem List Items Addressed This Visit   ° °  ° Unprioritized  ° Vitamin D deficiency  ° Relevant Medications  ° Vitamin D, Ergocalciferol, (DRISDOL) 1.25 MG (50000 UNIT) CAPS capsule  ° Morbid obesity ([facility]) - Primary  ° Relevant Medications  ° tirzepatide (MOUNJARO) 7.5 MG/0.5ML Pen  ° HTN (hypertension)  °  Well controlled, no changes to meds. Encouraged heart healthy diet such as the DASH diet and exercise as tolerated.  °  °  ° Relevant Medications  ° chlorthalidone (HYGROTON) 25 MG tablet  ° Severe obesity (BMI >= 40) ([facility])  °  Doing well losing weight  with mounjaro  °Inc dose to 7.5 mg  °  °  ° Relevant Medications  ° tirzepatide (MOUNJARO) 7.5 MG/0.5ML Pen  ° °Other Visit Diagnoses   ° ° Essential hypertension      ° Relevant Medications  ° chlorthalidone (HYGROTON) 25 MG tablet  ° Type 2 diabetes mellitus without complication, without long-term current use of insulin ([facility])      ° Relevant Medications  ° tirzepatide (MOUNJARO) 7.5 MG/0.5ML Pen  ° Acute right-sided low back pain with right-sided sciatica      ° Relevant Medications  ° cyclobenzaprine (FLEXERIL) 10 MG tablet  ° traMADol (ULTRAM) 50 MG tablet  ° predniSONE (DELTASONE) 10 MG tablet  ° °  ° ° °Meds ordered this encounter  °Medications  ° chlorthalidone (HYGROTON) 25 MG tablet  °  Sig: Take 1 tablet (25 mg total) by mouth daily.  °  Dispense:  90 tablet  °  Refill:  1  ° Vitamin D, Ergocalciferol, (DRISDOL) 1.25 MG (50000 UNIT) CAPS capsule  °  Sig: Take 1 capsule (50,000 Units total) by mouth once a week.  °  Dispense:  12 capsule  °  Refill:  0  ° tirzepatide (MOUNJARO) 7.5 MG/0.5ML Pen  °  Sig: Inject 7.5 mg into the skin once a week.  °    Dispense:  6 mL  °  Refill:  2  ° cyclobenzaprine (FLEXERIL) 10 MG tablet  °  Sig: Take 1 tablet (10 mg total) by mouth 3 (three) times daily as needed for muscle spasms.  °  Dispense:  30 tablet  °  Refill:  0  ° traMADol (ULTRAM) 50 MG tablet  °  Sig: Take 1 tablet (50 mg total) by mouth every 8 (eight) hours as needed for up to 5 days.  °  Dispense:  15 tablet  °  Refill:  0  ° predniSONE (DELTASONE) 10 MG tablet  °  Sig: TAKE 3 TABLETS PO QD FOR 3 DAYS THEN TAKE 2 TABLETS PO QD FOR 3 DAYS THEN TAKE 1 TABLET PO QD FOR 3 DAYS THEN TAKE 1/2 TAB PO QD FOR 3 DAYS  °  Dispense:  20 tablet  °  Refill:  0  ° ° °Follow-up: Return in about 3 months (around 05/11/2021), or if symptoms worsen or fail to improve.  ° ° ° R Lowne Chase, DO °

## 2021-02-10 NOTE — Assessment & Plan Note (Signed)
Well controlled, no changes to meds. Encouraged heart healthy diet such as the DASH diet and exercise as tolerated.  °

## 2021-02-10 NOTE — Patient Instructions (Signed)

## 2021-02-10 NOTE — Assessment & Plan Note (Signed)
Doing well losing weight with mounjaro  Inc dose to 7.5 mg

## 2021-02-11 ENCOUNTER — Encounter: Payer: Self-pay | Admitting: Family Medicine

## 2021-02-11 ENCOUNTER — Telehealth: Payer: Self-pay

## 2021-02-11 DIAGNOSIS — M5441 Lumbago with sciatica, right side: Secondary | ICD-10-CM

## 2021-02-11 MED ORDER — PREDNISONE 10 MG PO TABS: ORAL_TABLET | ORAL | 0 refills | Status: DC

## 2021-02-11 NOTE — Telephone Encounter (Signed)
PA initiated via Covermymeds; KEY:  [id number]. Awaiting determination.

## 2021-02-11 NOTE — Telephone Encounter (Signed)
PA denied. Awaiting denial information.  °

## 2021-02-15 ENCOUNTER — Encounter: Payer: Self-pay | Admitting: Family Medicine

## 2021-02-15 NOTE — Telephone Encounter (Signed)
Initial Comment Caller is from [employer] regarding prior authorization for [employer] 7.5mg  pen Additional Comment The rx was denied was due to preferred rx's not being tried first. These are Ozemic Rybelsus Trulicity Victoza Pharmacy Name [facility] Rx Pharmacist Name [facility] Pharmacy Number [phone] Disp. Time Disposition Final User 02/14/2021 7:16:38 PM General Information Provided Yes Bjorn Loser Call Closed By: Bjorn Loser Transaction Date/Time: 02/14/2021 7:11:16 PM (ET

## 2021-02-18 NOTE — Telephone Encounter (Signed)
Spoke with pharmacy. They did not have a discount card on file. I called the patient and left VM letting him know the 7.5 was denied but I have a discount card he can try to lower the price. Advised patient to call us back.

## 2021-02-22 NOTE — Progress Notes (Signed)
[place] Phone: [phone] Subjective:   Fontaine No, am serving as a scribe for Dr. Hulan Saas. This visit occurred during the SARS-CoV-2 public health emergency.  Safety protocols were in place, including screening questions prior to the visit, additional usage of staff PPE, and extensive cleaning of exam room while observing appropriate contact time as indicated for disinfecting solutions.  I'm seeing this patient by the request  of:  Ann Held, DO  CC: low back pain   [id number]  01/13/2021 Patient has lost weight and I commended him to continue.  Patient is very motivated.  Patient is considering the gastric bypass surgery as well but would like to try to do it as natural as possible.  Patient is done very well with his primary care and the medications.  I am optimistic that patient can continue to lose weight.  Patient does need to get a BMI under 40 to be a potential candidate for the hip replacement if necessary.  Patient is going to continue to make these different changes.  Continue the vitamin D supplementation.  Follow-up with me again in 2 to 3 months.  Updated 02/23/2021 Linnell Fulling is a 46 y.o. male coming in with complaint of LBP on right side. Patient states that he was doing better. Put on a boot a couple of weeks ago and felt a pull in back on R side. Started using IBU and using TENS. Using prednisone from PCP as well as muscle relaxer. Little relief form prednisone. Does not feel like he has improved and notices a hard muscle near R lumbar spine. Pain with flexion but has trouble getting comfortable even when seated. Pain and numbness can radiate down leg if he is seated on couch.        Past Medical History:  Diagnosis Date   Heartburn    Hyperlipidemia    Hypertension    Sleep apnea    SOB (shortness of breath) on exertion    Swelling    feet and legs   No past  surgical history on file. Social History   Socioeconomic History   Marital status: Single    Spouse name: Not on file   Number of children: Not on file   Years of education: Not on file   Highest education level: Not on file  Occupational History   Occupation: Chief Executive Officer  Tobacco Use   Smoking status: Never   Smokeless tobacco: Never  Substance and Sexual Activity   Alcohol use: Yes   Drug use: No   Sexual activity: Yes  Other Topics Concern   Not on file  Social History Narrative   Not on file   Social Determinants of Health   Financial Resource Strain: Not on file  Food Insecurity: Not on file  Transportation Needs: Not on file  Physical Activity: Not on file  Stress: Not on file  Social Connections: Not on file   No Known Allergies Family History  Problem Relation Age of Onset   Heart disease Father 56       MI   Hypertension Father    Sudden death Father        Heart disease   Hyperlipidemia Father    Obesity Father    Diabetes Mother        Borderline   Hyperlipidemia Mother    Hypertension Mother    Depression Mother    Obesity  Mother    Breast cancer Maternal Grandmother     Current Outpatient Medications (Endocrine & Metabolic):    metFORMIN (GLUCOPHAGE) 500 MG tablet, Take 1 tablet (500 mg total) by mouth 2 (two) times daily with a meal.   predniSONE (DELTASONE) 10 MG tablet, TAKE 3 TABLETS PO QD FOR 3 DAYS THEN TAKE 2 TABLETS PO QD FOR 3 DAYS THEN TAKE 1 TABLET PO QD FOR 3 DAYS THEN TAKE 1/2 TAB PO QD FOR 3 DAYS   tirzepatide (MOUNJARO) 7.5 MG/0.5ML Pen, Inject 7.5 mg into the skin once a week.  Current Outpatient Medications (Cardiovascular):    atorvastatin (LIPITOR) 40 MG tablet, Take 1 tablet (40 mg total) by mouth daily.   chlorthalidone (HYGROTON) 25 MG tablet, Take 1 tablet (25 mg total) by mouth daily.   fenofibrate 160 MG tablet, Take 1 tablet (160 mg total) by mouth daily.   metoprolol succinate (TOPROL-XL) 100 MG 24 hr  tablet, TAKE 1 & 1/2 (ONE & ONE-HALF) TABLETS BY MOUTH ONCE DAILY   valsartan (DIOVAN) 160 MG tablet, Take 1 tablet (160 mg total) by mouth daily.   Current Outpatient Medications (Analgesics):    celecoxib (CELEBREX) 200 MG capsule, Take 1 capsule (200 mg total) by mouth 2 (two) times daily as needed.   meloxicam (MOBIC) 15 MG tablet, Take 1 tablet (15 mg total) by mouth daily.  Current Outpatient Medications (Hematological):    cyanocobalamin 1000 MCG tablet, Take 1,000 mcg by mouth daily.  Current Outpatient Medications (Other):    Ascorbic Acid (VITAMIN C) 1000 MG tablet, Take 1,000 mg by mouth daily.   blood glucose meter kit and supplies KIT, Dispense based on patient and insurance preference. Use up to four times daily as directed. (FOR ICD-9 250.00, 250.01).   cyclobenzaprine (FLEXERIL) 10 MG tablet, Take 1 tablet (10 mg total) by mouth 3 (three) times daily as needed for muscle spasms.   gabapentin (NEURONTIN) 100 MG capsule, Take 2 capsules (200 mg total) by mouth at bedtime.   Multiple Vitamin (MULTIVITAMIN) capsule, Take 1 capsule by mouth daily.   NONFORMULARY OR COMPOUNDED ITEM, Compression socks #1  As directed   Dx low ext edema   Vitamin D, Ergocalciferol, (DRISDOL) 1.25 MG (50000 UNIT) CAPS capsule, Take 1 capsule (50,000 Units total) by mouth once a week.   Reviewed prior external information including notes and imaging from  primary care provider As well as notes that were available from care everywhere and other healthcare systems.  Reviewed primary care notes.  Past medical history, social, surgical and family history all reviewed in electronic medical record.  No pertanent information unless stated regarding to the chief complaint.   Review of Systems:  No headache, visual changes, nausea, vomiting, diarrhea, constipation, dizziness, abdominal pain, skin rash, fevers, chills, night sweats, weight loss, swollen lymph nodes, body aches, joint swelling, chest pain,  shortness of breath, mood changes. POSITIVE muscle aches  Objective  Blood pressure (!) 144/88, pulse 100, height $RemoveBef'6\' 1"'YaTxhShWor$  (1.854 m), weight (!) 380 lb (172.4 kg), SpO2 98 %.   General: No apparent distress alert and oriented x3 mood and affect normal, dressed appropriately. Morbid obese  HEENT: Pupils equal, extraocular movements intact  Respiratory: Patient's speak in full sentences and does not appear short of breath  Cardiovascular: No lower extremity edema, non tender, no erythema  Gait patient is minorly antalgic.  Favoring the right hip.  Patient's low back tender to palpation with significant spasm noted of the lumbar musculature.  Patient has  tightness with straight leg test but no true radicular symptoms at this time.   After verbal consent patient was prepped with alcohol swab and with a 25-gauge 1 inch needle injected into 3 distinct trigger points in the lumbar musculature.  A total of 3 cc of 0.5% Marcaine and 1 cc of Kenalog 40 mg/mL used.   Impression and Recommendations:     The above documentation has been reviewed and is accurate and complete Lyndal Pulley, DO

## 2021-02-23 ENCOUNTER — Ambulatory Visit: Payer: BC Managed Care – PPO | Admitting: Family Medicine

## 2021-02-23 ENCOUNTER — Encounter: Payer: Self-pay | Admitting: Family Medicine

## 2021-02-23 ENCOUNTER — Ambulatory Visit (INDEPENDENT_AMBULATORY_CARE_PROVIDER_SITE_OTHER): Payer: BC Managed Care – PPO

## 2021-02-23 ENCOUNTER — Other Ambulatory Visit: Payer: Self-pay

## 2021-02-23 VITALS — BP 144/88 | HR 100 | Ht 73.0 in | Wt 380.0 lb

## 2021-02-23 DIAGNOSIS — M2550 Pain in unspecified joint: Secondary | ICD-10-CM | POA: Diagnosis not present

## 2021-02-23 DIAGNOSIS — M545 Low back pain, unspecified: Secondary | ICD-10-CM | POA: Diagnosis not present

## 2021-02-23 DIAGNOSIS — M5459 Other low back pain: Secondary | ICD-10-CM | POA: Diagnosis not present

## 2021-02-23 MED ORDER — KETOROLAC TROMETHAMINE 60 MG/2ML IM SOLN
60.0000 mg | Freq: Once | INTRAMUSCULAR | Status: AC
  Administered 2021-02-23: 60 mg via INTRAMUSCULAR

## 2021-02-23 MED ORDER — METHYLPREDNISOLONE ACETATE 80 MG/ML IJ SUSP: 80.0000 mg | Freq: Once | INTRAMUSCULAR | Status: DC

## 2021-02-23 MED ORDER — GABAPENTIN 100 MG PO CAPS: 200.0000 mg | ORAL_CAPSULE | Freq: Every day | ORAL | 0 refills | Status: DC

## 2021-02-23 NOTE — Patient Instructions (Addendum)
Trigger point injections today Gabapentin 200mg  Exercises for back but dont let it make hip angry See me in 6 weeks

## 2021-02-24 DIAGNOSIS — M545 Low back pain, unspecified: Secondary | ICD-10-CM | POA: Insufficient documentation

## 2021-02-24 NOTE — Assessment & Plan Note (Signed)
Lumbar spasm noted.  Trigger point injections given today, given Toradol injection as well to help with some of the pain.  Started on gabapentin to help with worsening nighttime pain.  Patient does have the underlying severe hip arthritis and is continuing to work on losing weight.  Patient knows he needs to get BMI down under 40 and patient has made a fairly good improvement with his weight loss already.  Discussed icing regimen and home exercises.  Increase activity slowly.  Follow-up again in 3-4weeks.

## 2021-03-03 ENCOUNTER — Encounter: Payer: Self-pay | Admitting: Family Medicine

## 2021-03-03 DIAGNOSIS — E119 Type 2 diabetes mellitus without complications: Secondary | ICD-10-CM

## 2021-03-04 ENCOUNTER — Telehealth: Payer: Self-pay

## 2021-03-04 MED ORDER — TIRZEPATIDE 7.5 MG/0.5ML ~~LOC~~ SOAJ: 7.5000 mg | SUBCUTANEOUS | 2 refills | Status: DC

## 2021-03-04 NOTE — Telephone Encounter (Signed)
PA initiated via Covermymeds; KEY: [id number].   PA approved.   PA Case: [id number], Status: Approved, Coverage Starts on: 03/04/2021 12:00:00 AM, Coverage Ends on: 03/04/2022 12:00:00 AM

## 2021-03-24 ENCOUNTER — Ambulatory Visit: Payer: BC Managed Care – PPO | Admitting: Family Medicine

## 2021-05-12 ENCOUNTER — Ambulatory Visit: Payer: BC Managed Care – PPO | Admitting: Family Medicine

## 2021-05-31 ENCOUNTER — Telehealth: Payer: Self-pay | Admitting: Family Medicine

## 2021-05-31 NOTE — Telephone Encounter (Signed)
Pt called, will not have insurance until 5/15. I have scheduled him for an appt 5/18. ? ?R hip was improving but in the past 2 weeks it has gotten back to as bad as it was when he first saw [name]. ? ?Afraid to do exercises, still working long days on his feet. Has lost 45 lbs! ? ?Taking only 1 meloxicam at nightime. No OTC meds. ? ?Pt trying to hold on until insurance effective, any recommendations for the next 2 weeks? ?

## 2021-06-02 ENCOUNTER — Other Ambulatory Visit: Payer: Self-pay

## 2021-06-02 MED ORDER — PREDNISONE 20 MG PO TABS: 40.0000 mg | ORAL_TABLET | Freq: Every day | ORAL | 0 refills | Status: DC

## 2021-06-02 NOTE — Telephone Encounter (Signed)
We can do short course of prednsione if he would like. Would do 40 mg daily for 5 days. Tell him to hold on the meloxicam while he is on it  ?

## 2021-06-02 NOTE — Telephone Encounter (Signed)
Sent patient MyChart message to see what he would like to do.  ?

## 2021-06-20 NOTE — Telephone Encounter (Signed)
err

## 2021-06-22 NOTE — Progress Notes (Signed)
Sean  [name] [address] [name] Phone: [phone] Subjective:   IVilma [name], am serving as a scribe for Dr. Hulan Saas. This visit occurred during the SARS-CoV-2 public health emergency.  Safety protocols were in place, including screening questions prior to the visit, additional usage of staff PPE, and extensive cleaning of exam room while observing appropriate contact time as indicated for disinfecting solutions.   I'm seeing this patient by the request  of:  Sean Held, DO  CC: low back pain   [id number]  02/23/2021 Lumbar spasm noted.  Trigger point injections given today, given Toradol injection as well to help with some of the pain.  Started on gabapentin to help with worsening nighttime pain.  Patient does have the underlying severe hip arthritis and is continuing to work on losing weight.  Patient knows he needs to get BMI down under 40 and patient has made a fairly good improvement with his weight loss already.  Discussed icing regimen and home exercises.  Increase activity slowly.  Follow-up again in 3-4weeks.  Update 06/23/2021 Sean [name] is a 46 y.o. male coming in with complaint of lumbar spine pain. Patient states back is doing much great. Injection helped. Right hip pain today. Injection today in hip. Takes mobic at night with another medication for arthritis. Wants to know next steps. Trying to lose weight naturally.       Past Medical History:  Diagnosis Date   Heartburn    Hyperlipidemia    Hypertension    Sleep apnea    SOB (shortness of breath) on exertion    Swelling    feet and legs   No past surgical history on file. Social History   Socioeconomic History   Marital status: Married    Spouse name: Not on file   Number of children: Not on file   Years of education: Not on file   Highest education level: Not on file  Occupational History   Occupation: Chief Executive Officer   Tobacco Use   Smoking status: Never   Smokeless tobacco: Never  Substance and Sexual Activity   Alcohol use: Yes   Drug use: No   Sexual activity: Yes  Other Topics Concern   Not on file  Social History Narrative   Not on file   Social Determinants of Health   Financial Resource Strain: Not on file  Food Insecurity: Not on file  Transportation Needs: Not on file  Physical Activity: Not on file  Stress: Not on file  Social Connections: Not on file   No Known Allergies Family History  Problem Relation Age of Onset   Heart disease Father 45       MI   Hypertension Father    Sudden death Father        Heart disease   Hyperlipidemia Father    Obesity Father    Diabetes Mother        Borderline   Hyperlipidemia Mother    Hypertension Mother    Depression Mother    Obesity Mother    Breast cancer Maternal Grandmother     Current Outpatient Medications (Endocrine & Metabolic):    metFORMIN (GLUCOPHAGE) 500 MG tablet, Take 1 tablet (500 mg total) by mouth 2 (two) times daily with a meal.   predniSONE (DELTASONE) 20 MG tablet, Take 2 tablets (40 mg total) by mouth daily with breakfast.   tirzepatide (MOUNJARO) 7.5 MG/0.5ML Pen, Inject 7.5 mg into  the skin once a week.  Current Outpatient Medications (Cardiovascular):    atorvastatin (LIPITOR) 40 MG tablet, Take 1 tablet (40 mg total) by mouth daily.   chlorthalidone (HYGROTON) 25 MG tablet, Take 1 tablet (25 mg total) by mouth daily.   fenofibrate 160 MG tablet, Take 1 tablet (160 mg total) by mouth daily.   metoprolol succinate (TOPROL-XL) 100 MG 24 hr tablet, TAKE 1 & 1/2 (ONE & ONE-HALF) TABLETS BY MOUTH ONCE DAILY   valsartan (DIOVAN) 160 MG tablet, Take 1 tablet (160 mg total) by mouth daily.   Current Outpatient Medications (Analgesics):    celecoxib (CELEBREX) 200 MG capsule, Take 1 capsule (200 mg total) by mouth 2 (two) times daily as needed.   meloxicam (MOBIC) 15 MG tablet, Take 1 tablet (15 mg total) by  mouth daily.  Current Outpatient Medications (Hematological):    cyanocobalamin 1000 MCG tablet, Take 1,000 mcg by mouth daily.  Current Outpatient Medications (Other):    Ascorbic Acid (VITAMIN C) 1000 MG tablet, Take 1,000 mg by mouth daily.   blood glucose meter kit and supplies KIT, Dispense based on patient and insurance preference. Use up to four times daily as directed. (FOR ICD-9 250.00, 250.01).   cyclobenzaprine (FLEXERIL) 10 MG tablet, Take 1 tablet (10 mg total) by mouth 3 (three) times daily as needed for muscle spasms.   gabapentin (NEURONTIN) 100 MG capsule, Take 2 capsules (200 mg total) by mouth at bedtime.   Multiple Vitamin (MULTIVITAMIN) capsule, Take 1 capsule by mouth daily.   NONFORMULARY OR COMPOUNDED ITEM, Compression socks #1  As directed   Dx low ext edema   Vitamin D, Ergocalciferol, (DRISDOL) 1.25 MG (50000 UNIT) CAPS capsule, Take 1 capsule (50,000 Units total) by mouth once a week.   Reviewed prior external information including notes and imaging from  primary care provider As well as notes that were available from care everywhere and other healthcare systems.  Past medical history, social, surgical and family history all reviewed in electronic medical record.  No pertanent information unless stated regarding to the chief complaint.   Review of Systems:  No headache, visual changes, nausea, vomiting, diarrhea, constipation, dizziness, abdominal pain, skin rash, fevers, chills, night sweats, weight loss, swollen lymph nodes, body aches, chest pain, shortness of breath, mood changes. POSITIVE muscle aches, joint pain   Objective  Blood pressure (!) 152/98, pulse 77, height $RemoveBe'6\' 1"'bDPEEdDNm$  (1.854 m), weight (!) 363 lb (164.7 kg), SpO2 96 %.   General: No apparent distress alert and oriented x3 mood and affect normal, dressed appropriately.  HEENT: Pupils equal, extraocular movements intact  Respiratory: Patient's speak in full sentences and does not appear short of  breath  Cardiovascular: 1+ lower extremity edema, non tender, no erythema  Gait antalgic MSK:  low back loss of lordosis  Right hip exam has 0 degrees of internal rotation and only 20 degrees of external rotation. Left hip has improvement in range of motion compared to that side but still decreased secondary to patient's body habitus.  Procedure: Real-time Ultrasound Guided Injection of right hip difficult secondary to patient body habitus Device: GE Logiq Q7  Ultrasound guided injection is preferred based studies that show increased duration, increased effect, greater accuracy, decreased procedural pain, increased response rate with ultrasound guided versus blind injection.  Verbal informed consent obtained.  Time-out conducted.  Noted no overlying erythema, induration, or other signs of local infection.  Skin prepped in a sterile fashion.  Local anesthesia: Topical Ethyl chloride.  With  sterile technique and under real time ultrasound guidance:  Anterior capsule visualized, needle visualized going to the head neck junction at the anterior capsule. Pictures taken. Patient did have injection of  3 cc of 0.5% Marcaine, and 1 cc of Kenalog 40 mg/dL. Completed without difficulty  Pain immediately resolved suggesting accurate placement of the medication.  Advised to call if fevers/chills, erythema, induration, drainage, or persistent bleeding.  Impression: Technically successful ultrasound guided injection.   Impression and Recommendations:     The above documentation has been reviewed and is accurate and complete Lyndal Pulley, DO

## 2021-06-23 ENCOUNTER — Ambulatory Visit: Payer: Self-pay

## 2021-06-23 ENCOUNTER — Ambulatory Visit: Payer: BC Managed Care – PPO | Admitting: Family Medicine

## 2021-06-23 VITALS — BP 152/98 | HR 77 | Ht 73.0 in | Wt 363.0 lb

## 2021-06-23 DIAGNOSIS — M1611 Unilateral primary osteoarthritis, right hip: Secondary | ICD-10-CM | POA: Diagnosis not present

## 2021-06-23 NOTE — Patient Instructions (Addendum)
Good to see you  Talk to Lowne on increase the medication when you see her Injected the hip today and hope it buys you time  Goal weight 287 See me again if you need me or can repeat in 12 weeks.  Keep me updated on the weight loss as well

## 2021-06-23 NOTE — Assessment & Plan Note (Signed)
Patient given injection and tolerated the procedure well.  Encourage patient to discuss with primary care if there is any room to increase the [facility].  Discussed with patient about his goal weight and what is going towards so he can be a candidate for the hip replacement and we will review refer accordingly f/u in 12 weeks

## 2021-07-05 ENCOUNTER — Telehealth: Payer: Self-pay | Admitting: Family Medicine

## 2021-07-05 ENCOUNTER — Encounter: Payer: Self-pay | Admitting: Family Medicine

## 2021-07-05 ENCOUNTER — Ambulatory Visit (INDEPENDENT_AMBULATORY_CARE_PROVIDER_SITE_OTHER): Payer: BC Managed Care – PPO | Admitting: Family Medicine

## 2021-07-05 VITALS — BP 160/100 | HR 80 | Temp 98.3°F | Resp 18 | Ht 73.0 in | Wt 359.6 lb

## 2021-07-05 DIAGNOSIS — Z0000 Encounter for general adult medical examination without abnormal findings: Secondary | ICD-10-CM

## 2021-07-05 DIAGNOSIS — E559 Vitamin D deficiency, unspecified: Secondary | ICD-10-CM

## 2021-07-05 DIAGNOSIS — E1169 Type 2 diabetes mellitus with other specified complication: Secondary | ICD-10-CM

## 2021-07-05 DIAGNOSIS — E1165 Type 2 diabetes mellitus with hyperglycemia: Secondary | ICD-10-CM | POA: Diagnosis not present

## 2021-07-05 DIAGNOSIS — I10 Essential (primary) hypertension: Secondary | ICD-10-CM

## 2021-07-05 DIAGNOSIS — M25552 Pain in left hip: Secondary | ICD-10-CM

## 2021-07-05 DIAGNOSIS — E785 Hyperlipidemia, unspecified: Secondary | ICD-10-CM

## 2021-07-05 DIAGNOSIS — M25551 Pain in right hip: Secondary | ICD-10-CM

## 2021-07-05 MED ORDER — FENOFIBRATE 160 MG PO TABS: 160.0000 mg | ORAL_TABLET | Freq: Every day | ORAL | 3 refills | Status: DC

## 2021-07-05 MED ORDER — VITAMIN D (ERGOCALCIFEROL) 1.25 MG (50000 UNIT) PO CAPS: 50000.0000 [IU] | ORAL_CAPSULE | ORAL | 0 refills | Status: DC

## 2021-07-05 MED ORDER — METOPROLOL SUCCINATE ER 100 MG PO TB24: ORAL_TABLET | ORAL | 1 refills | Status: DC

## 2021-07-05 MED ORDER — TIRZEPATIDE 10 MG/0.5ML ~~LOC~~ SOAJ: 10.0000 mg | SUBCUTANEOUS | 0 refills | Status: DC

## 2021-07-05 NOTE — Progress Notes (Signed)
Subjective:   By signing my name below, I, Sean [name], attest that this documentation has been prepared under the direction and in the presence of Donato Schultz, DO. 07/05/2021    Patient ID: Sean [name], male    DOB: 11-Jan-1976, 46 y.o.   MRN: [id number]  Chief Complaint  Patient presents with   Annual Exam    Pt states fasting     HPI Patient is in today for a comprehensive physical exam.   He continues taking 7.5 mg monjaro and reports no new issues while taking it. He finds his weight loss has plateaued. He reports having pain in his hips and seeing his orthopedist specialist. He could not exercise regularly due to the pain. He was given an injection and prednisone in his hip to manage his pain. He found while on prednisone his appetite was increased so he did not follow his diet. His orthopedist specialist told him he will not consider surgery to manage his hip pain until he loses 85 lb's. He is planning on doing water aerobic and water resistance exercise regularly.  Wt Readings from Last 3 Encounters:  07/05/21 (!) 359 lb 9.6 oz (163.1 kg)  06/23/21 (!) 363 lb (164.7 kg)  02/23/21 (!) 380 lb (172.4 kg)   He is requesting for a form for his workplace be filled out.  His blood pressure is elevated during this visit. He continues taking 160 mg valsartan and reports no new issues while taking it. He stopped taking metoprolol succinate and does not remember which provider took him off it.  BP Readings from Last 3 Encounters:  07/05/21 (!) 160/100  06/23/21 (!) 152/98  02/23/21 (!) 144/88   Pulse Readings from Last 3 Encounters:  07/05/21 80  06/23/21 77  02/23/21 100   He continues taking atorvastatin and fenofibrate and reports no new issues while taking it. He is requesting a refill on fenofibrate.  He continues taking meloxicam every night to manage his hip pain and reports no new issues while taking it.   He denies having any fever, new muscle pain, new  joint pain, new moles, congestion, sinus pain, sore throat, chest pain, palpations, cough, SOB, wheezing, n/v/d, constipation, blood in stool, dysuria, frequency, hematuria, or headaches at this time. He reports being UTD on vision care this year.    Past Medical History:  Diagnosis Date   Heartburn    Hyperlipidemia    Hypertension    Sleep apnea    SOB (shortness of breath) on exertion    Swelling    feet and legs    No past surgical history on file.  Family History  Problem Relation Age of Onset   Heart disease Father 37       MI   Hypertension Father    Sudden death Father        Heart disease   Hyperlipidemia Father    Obesity Father    Diabetes Mother        Borderline   Hyperlipidemia Mother    Hypertension Mother    Depression Mother    Obesity Mother    Breast cancer Maternal Grandmother     Social History   Socioeconomic History   Marital status: Married    Spouse name: Not on file   Number of children: Not on file   Years of education: Not on file   Highest education level: Not on file  Occupational History   Occupation: Dietitian  Tobacco  Use   Smoking status: Never   Smokeless tobacco: Never  Substance and Sexual Activity   Alcohol use: Yes   Drug use: No   Sexual activity: Yes  Other Topics Concern   Not on file  Social History Narrative   Not on file   Social Determinants of Health   Financial Resource Strain: Not on file  Food Insecurity: Not on file  Transportation Needs: Not on file  Physical Activity: Not on file  Stress: Not on file  Social Connections: Not on file  Intimate Partner Violence: Not on file    Outpatient Medications Prior to Visit  Medication Sig Dispense Refill   Ascorbic Acid (VITAMIN C) 1000 MG tablet Take 1,000 mg by mouth daily.     atorvastatin (LIPITOR) 40 MG tablet Take 1 tablet (40 mg total) by mouth daily. 90 tablet 3   blood glucose meter kit and supplies KIT Dispense based on patient  and insurance preference. Use up to four times daily as directed. (FOR ICD-9 250.00, 250.01). 1 each 0   celecoxib (CELEBREX) 200 MG capsule Take 1 capsule (200 mg total) by mouth 2 (two) times daily as needed. 60 capsule 6   chlorthalidone (HYGROTON) 25 MG tablet Take 1 tablet (25 mg total) by mouth daily. 90 tablet 1   cyanocobalamin 1000 MCG tablet Take 1,000 mcg by mouth daily.     meloxicam (MOBIC) 15 MG tablet Take 1 tablet (15 mg total) by mouth daily. 30 tablet 0   metFORMIN (GLUCOPHAGE) 500 MG tablet Take 1 tablet (500 mg total) by mouth 2 (two) times daily with a meal. 180 tablet 1   Multiple Vitamin (MULTIVITAMIN) capsule Take 1 capsule by mouth daily.     NONFORMULARY OR COMPOUNDED ITEM Compression socks #1  As directed   Dx low ext edema 1 each 0   valsartan (DIOVAN) 160 MG tablet Take 1 tablet (160 mg total) by mouth daily. 90 tablet 2   fenofibrate 160 MG tablet Take 1 tablet (160 mg total) by mouth daily. 90 tablet 3   tirzepatide (MOUNJARO) 7.5 MG/0.5ML Pen Inject 7.5 mg into the skin once a week. 6 mL 2   cyclobenzaprine (FLEXERIL) 10 MG tablet Take 1 tablet (10 mg total) by mouth 3 (three) times daily as needed for muscle spasms. (Patient not taking: Reported on 07/05/2021) 30 tablet 0   gabapentin (NEURONTIN) 100 MG capsule Take 2 capsules (200 mg total) by mouth at bedtime. (Patient not taking: Reported on 07/05/2021) 180 capsule 0   metoprolol succinate (TOPROL-XL) 100 MG 24 hr tablet TAKE 1 & 1/2 (ONE & ONE-HALF) TABLETS BY MOUTH ONCE DAILY (Patient not taking: Reported on 07/05/2021) 135 tablet 1   predniSONE (DELTASONE) 20 MG tablet Take 2 tablets (40 mg total) by mouth daily with breakfast. 10 tablet 0   Vitamin D, Ergocalciferol, (DRISDOL) 1.25 MG (50000 UNIT) CAPS capsule Take 1 capsule (50,000 Units total) by mouth once a week. (Patient not taking: Reported on 07/05/2021) 12 capsule 0   No facility-administered medications prior to visit.    No Known Allergies  Review  of Systems  Constitutional:  Negative for fever and malaise/fatigue.  HENT:  Negative for congestion, sinus pain and sore throat.   Eyes:  Negative for blurred vision.  Respiratory:  Negative for cough, shortness of breath and wheezing.   Cardiovascular:  Negative for chest pain, palpitations and leg swelling.  Gastrointestinal:  Negative for blood in stool, constipation, diarrhea, nausea and vomiting.  Genitourinary:  Negative for dysuria, frequency and hematuria.  Musculoskeletal:  Negative for back pain, joint pain and myalgias.  Skin:  Negative for rash.       (-)New moles  Neurological:  Negative for loss of consciousness and headaches.      Objective:    Physical Exam Vitals and nursing note reviewed.  Constitutional:      General: He is not in acute distress.    Appearance: Normal appearance. He is not ill-appearing.  HENT:     Head: Normocephalic and atraumatic.     Right Ear: Tympanic membrane, ear canal and external ear normal.     Left Ear: Tympanic membrane, ear canal and external ear normal.  Eyes:     Extraocular Movements: Extraocular movements intact.     Pupils: Pupils are equal, round, and reactive to light.  Cardiovascular:     Rate and Rhythm: Normal rate and regular rhythm.     Heart sounds: Normal heart sounds. No murmur heard.   No gallop.  Pulmonary:     Effort: Pulmonary effort is normal. No respiratory distress.     Breath sounds: Normal breath sounds. No wheezing or rales.  Abdominal:     General: Bowel sounds are normal. There is no distension.     Palpations: Abdomen is soft.     Tenderness: There is no abdominal tenderness. There is no guarding.  Skin:    General: Skin is warm and dry.  Neurological:     Mental Status: He is alert and oriented to person, place, and time.  Psychiatric:        Judgment: Judgment normal.    BP (!) 160/100 (BP Location: Right Arm, Patient Position: Sitting, Cuff Size: Large)   Pulse 80   Temp 98.3 F (36.8  C) (Oral)   Resp 18   Ht $R'6\' 1"'tz$  (1.854 m)   Wt (!) 359 lb 9.6 oz (163.1 kg)   SpO2 98%   BMI 47.44 kg/m  Wt Readings from Last 3 Encounters:  07/05/21 (!) 359 lb 9.6 oz (163.1 kg)  06/23/21 (!) 363 lb (164.7 kg)  02/23/21 (!) 380 lb (172.4 kg)    Diabetic Foot Exam - Simple   No data filed    Lab Results  Component Value Date   WBC 11.4 (H) 12/16/2020   HGB 14.6 12/16/2020   HCT 43.2 12/16/2020   PLT 281.0 12/16/2020   GLUCOSE 84 12/16/2020   CHOL 176 12/16/2020   TRIG 115.0 12/16/2020   HDL 48.30 12/16/2020   LDLDIRECT 107.0 08/05/2019   LDLCALC 105 (H) 12/16/2020   ALT 27 12/16/2020   AST 23 12/16/2020   NA 141 12/16/2020   K 3.6 12/16/2020   CL 101 12/16/2020   CREATININE 1.05 12/16/2020   BUN 23 12/16/2020   CO2 30 12/16/2020   TSH 2.43 12/16/2020   HGBA1C 6.1 12/16/2020   MICROALBUR 0.7 03/25/2020    Lab Results  Component Value Date   TSH 2.43 12/16/2020   Lab Results  Component Value Date   WBC 11.4 (H) 12/16/2020   HGB 14.6 12/16/2020   HCT 43.2 12/16/2020   MCV 85.8 12/16/2020   PLT 281.0 12/16/2020   Lab Results  Component Value Date   NA 141 12/16/2020   K 3.6 12/16/2020   CO2 30 12/16/2020   GLUCOSE 84 12/16/2020   BUN 23 12/16/2020   CREATININE 1.05 12/16/2020   BILITOT 0.6 12/16/2020   ALKPHOS 59 12/16/2020   AST 23 12/16/2020   ALT  27 12/16/2020   PROT 7.2 12/16/2020   ALBUMIN 4.6 12/16/2020   CALCIUM 9.5 12/16/2020   ANIONGAP 11 08/07/2017   GFR 85.45 12/16/2020   Lab Results  Component Value Date   CHOL 176 12/16/2020   Lab Results  Component Value Date   HDL 48.30 12/16/2020   Lab Results  Component Value Date   LDLCALC 105 (H) 12/16/2020   Lab Results  Component Value Date   TRIG 115.0 12/16/2020   Lab Results  Component Value Date   CHOLHDL 4 12/16/2020   Lab Results  Component Value Date   HGBA1C 6.1 12/16/2020       Assessment & Plan:   Problem List Items Addressed This Visit        Unprioritized   Vitamin D deficiency   Relevant Medications   Vitamin D, Ergocalciferol, (DRISDOL) 1.25 MG (50000 UNIT) CAPS capsule   Other Relevant Orders   VITAMIN D 25 Hydroxy (Vit-D Deficiency, Fractures)   Hyperlipidemia   Relevant Medications   metoprolol succinate (TOPROL-XL) 100 MG 24 hr tablet   fenofibrate 160 MG tablet   Uncontrolled type 2 diabetes mellitus with hyperglycemia (HCC)    mounjaro refilled at 10 mg  F/u 3 months        Relevant Medications   tirzepatide (MOUNJARO) 10 MG/0.5ML Pen   Morbid obesity ([place])    ccon't diet and exercise mounjaro helps with this to        Relevant Medications   tirzepatide (MOUNJARO) 10 MG/0.5ML Pen   Hyperlipidemia associated with type 2 diabetes mellitus ([place])    Encourage heart healthy diet such as MIND or DASH diet, increase exercise, avoid trans fats, simple carbohydrates and processed foods, consider a krill or fish or flaxseed oil cap daily.        Relevant Medications   tirzepatide (MOUNJARO) 10 MG/0.5ML Pen   metoprolol succinate (TOPROL-XL) 100 MG 24 hr tablet   fenofibrate 160 MG tablet   Hip pain    Per ortho Needs to lose more weight before surgery can be done        Other Visit Diagnoses     Preventative health care    -  Primary   Relevant Orders   CBC with Differential/Platelet   Comprehensive metabolic panel   Hemoglobin A1c   Lipid panel   TSH   Microalbumin / creatinine urine ratio   Type 2 diabetes mellitus with hyperglycemia, without long-term current use of insulin (HCC)       Relevant Medications   tirzepatide (MOUNJARO) 10 MG/0.5ML Pen   Other Relevant Orders   CBC with Differential/Platelet   Comprehensive metabolic panel   Hemoglobin A1c   Lipid panel   TSH   Microalbumin / creatinine urine ratio   Essential hypertension       Relevant Medications   metoprolol succinate (TOPROL-XL) 100 MG 24 hr tablet   fenofibrate 160 MG tablet   Other Relevant Orders   CBC with  Differential/Platelet   Comprehensive metabolic panel   Hemoglobin A1c   Lipid panel   TSH   Microalbumin / creatinine urine ratio        Meds ordered this encounter  Medications   tirzepatide (MOUNJARO) 10 MG/0.5ML Pen    Sig: Inject 10 mg into the skin once a week.    Dispense:  18 mL    Refill:  0   metoprolol succinate (TOPROL-XL) 100 MG 24 hr tablet    Sig: TAKE 1 & 1/2 (ONE &  ONE-HALF) TABLETS BY MOUTH ONCE DAILY    Dispense:  135 tablet    Refill:  1   fenofibrate 160 MG tablet    Sig: Take 1 tablet (160 mg total) by mouth daily.    Dispense:  90 tablet    Refill:  3   Vitamin D, Ergocalciferol, (DRISDOL) 1.25 MG (50000 UNIT) CAPS capsule    Sig: Take 1 capsule (50,000 Units total) by mouth once a week.    Dispense:  12 capsule    Refill:  0    I, Ann Held, DO, personally preformed the services described in this documentation.  All medical record entries made by the scribe were at my direction and in my presence.  I have reviewed the chart and discharge instructions (if applicable) and agree that the record reflects my personal performance and is accurate and complete. 07/05/2021   I,Sean [name],acting as a scribe for Ann Held, DO.,have documented all relevant documentation on the behalf of Ann Held, DO,as directed by  Ann Held, DO while in the presence of Ann Held, DO.   Ann Held, DO

## 2021-07-05 NOTE — Assessment & Plan Note (Signed)
Per ortho Needs to lose more weight before surgery can be done

## 2021-07-05 NOTE — Assessment & Plan Note (Signed)
Encourage heart healthy diet such as MIND or DASH diet, increase exercise, avoid trans fats, simple carbohydrates and processed foods, consider a krill or fish or flaxseed oil cap daily.  °

## 2021-07-05 NOTE — Assessment & Plan Note (Signed)
ccon't diet and exercise mounjaro helps with this to

## 2021-07-05 NOTE — Patient Instructions (Signed)
Calorie Counting for Weight Loss Calories are units of energy. Your body needs a certain number of calories from food to keep going throughout the day. When you eat or drink more calories than your body needs, your body stores the extra calories mostly as fat. When you eat or drink fewer calories than your body needs, your body burns fat to get the energy it needs. Calorie counting means keeping track of how many calories you eat and drink each day. Calorie counting can be helpful if you need to lose weight. If you eat fewer calories than your body needs, you should lose weight. Ask your health care provider what a healthy weight is for you. For calorie counting to work, you will need to eat the right number of calories each day to lose a healthy amount of weight per week. A dietitian can help you figure out how many calories you need in a day and will suggest ways to reach your calorie goal. A healthy amount of weight to lose each week is usually 1-2 lb (0.5-0.9 kg). This usually means that your daily calorie intake should be reduced by 500-750 calories. Eating 1,200-1,500 calories a day can help most women lose weight. Eating 1,500-1,800 calories a day can help most men lose weight. What do I need to know about calorie counting? Work with your health care provider or dietitian to determine how many calories you should get each day. To meet your daily calorie goal, you will need to: Find out how many calories are in each food that you would like to eat. Try to do this before you eat. Decide how much of the food you plan to eat. Keep a food log. Do this by writing down what you ate and how many calories it had. To successfully lose weight, it is important to balance calorie counting with a healthy lifestyle that includes regular activity. Where do I find calorie information?  The number of calories in a food can be found on a Nutrition Facts label. If a food does not have a Nutrition Facts label, try  to look up the calories online or ask your dietitian for help. Remember that calories are listed per serving. If you choose to have more than one serving of a food, you will have to multiply the calories per serving by the number of servings you plan to eat. For example, the label on a package of bread might say that a serving size is 1 slice and that there are 90 calories in a serving. If you eat 1 slice, you will have eaten 90 calories. If you eat 2 slices, you will have eaten 180 calories. How do I keep a food log? After each time that you eat, record the following in your food log as soon as possible: What you ate. Be sure to include toppings, sauces, and other extras on the food. How much you ate. This can be measured in cups, ounces, or number of items. How many calories were in each food and drink. The total number of calories in the food you ate. Keep your food log near you, such as in a pocket-sized notebook or on an app or website on your mobile phone. Some programs will calculate calories for you and show you how many calories you have left to meet your daily goal. What are some portion-control tips? Know how many calories are in a serving. This will help you know how many servings you can have of a certain   food. Use a measuring cup to measure serving sizes. You could also try weighing out portions on a kitchen scale. With time, you will be able to estimate serving sizes for some foods. Take time to put servings of different foods on your favorite plates or in your favorite bowls and cups so you know what a serving looks like. Try not to eat straight from a food's packaging, such as from a bag or box. Eating straight from the package makes it hard to see how much you are eating and can lead to overeating. Put the amount you would like to eat in a cup or on a plate to make sure you are eating the right portion. Use smaller plates, glasses, and bowls for smaller portions and to prevent  overeating. Try not to multitask. For example, avoid watching TV or using your computer while eating. If it is time to eat, sit down at a table and enjoy your food. This will help you recognize when you are full. It will also help you be more mindful of what and how much you are eating. What are tips for following this plan? Reading food labels Check the calorie count compared with the serving size. The serving size may be smaller than what you are used to eating. Check the source of the calories. Try to choose foods that are high in protein, fiber, and vitamins, and low in saturated fat, trans fat, and sodium. Shopping Read nutrition labels while you shop. This will help you make healthy decisions about which foods to buy. Pay attention to nutrition labels for low-fat or fat-free foods. These foods sometimes have the same number of calories or more calories than the full-fat versions. They also often have added sugar, starch, or salt to make up for flavor that was removed with the fat. Make a grocery list of lower-calorie foods and stick to it. Cooking Try to cook your favorite foods in a healthier way. For example, try baking instead of frying. Use low-fat dairy products. Meal planning Use more fruits and vegetables. One-half of your plate should be fruits and vegetables. Include lean proteins, such as chicken, turkey, and fish. Lifestyle Each week, aim to do one of the following: 150 minutes of moderate exercise, such as walking. 75 minutes of vigorous exercise, such as running. General information Know how many calories are in the foods you eat most often. This will help you calculate calorie counts faster. Find a way of tracking calories that works for you. Get creative. Try different apps or programs if writing down calories does not work for you. What foods should I eat?  Eat nutritious foods. It is better to have a nutritious, high-calorie food, such as an avocado, than a food with  few nutrients, such as a bag of potato chips. Use your calories on foods and drinks that will fill you up and will not leave you hungry soon after eating. Examples of foods that fill you up are nuts and nut butters, vegetables, lean proteins, and high-fiber foods such as whole grains. High-fiber foods are foods with more than 5 g of fiber per serving. Pay attention to calories in drinks. Low-calorie drinks include water and unsweetened drinks. The items listed above may not be a complete list of foods and beverages you can eat. Contact a dietitian for more information. What foods should I limit? Limit foods or drinks that are not good sources of vitamins, minerals, or protein or that are high in unhealthy fats. These   include: Candy. Other sweets. Sodas, specialty coffee drinks, alcohol, and juice. The items listed above may not be a complete list of foods and beverages you should avoid. Contact a dietitian for more information. How do I count calories when eating out? Pay attention to portions. Often, portions are much larger when eating out. Try these tips to keep portions smaller: Consider sharing a meal instead of getting your own. If you get your own meal, eat only half of it. Before you start eating, ask for a container and put half of your meal into it. When available, consider ordering smaller portions from the menu instead of full portions. Pay attention to your food and drink choices. Knowing the way food is cooked and what is included with the meal can help you eat fewer calories. If calories are listed on the menu, choose the lower-calorie options. Choose dishes that include vegetables, fruits, whole grains, low-fat dairy products, and lean proteins. Choose items that are boiled, broiled, grilled, or steamed. Avoid items that are buttered, battered, fried, or served with cream sauce. Items labeled as crispy are usually fried, unless stated otherwise. Choose water, low-fat milk,  unsweetened iced tea, or other drinks without added sugar. If you want an alcoholic beverage, choose a lower-calorie option, such as a glass of wine or light beer. Ask for dressings, sauces, and syrups on the side. These are usually high in calories, so you should limit the amount you eat. If you want a salad, choose a garden salad and ask for grilled meats. Avoid extra toppings such as bacon, cheese, or fried items. Ask for the dressing on the side, or ask for olive oil and vinegar or lemon to use as dressing. Estimate how many servings of a food you are given. Knowing serving sizes will help you be aware of how much food you are eating at restaurants. Where to find more information Centers for Disease Control and Prevention: www.cdc.gov U.[place]. Department of Agriculture: myplate.gov Summary Calorie counting means keeping track of how many calories you eat and drink each day. If you eat fewer calories than your body needs, you should lose weight. A healthy amount of weight to lose per week is usually 1-2 lb (0.5-0.9 kg). This usually means reducing your daily calorie intake by 500-750 calories. The number of calories in a food can be found on a Nutrition Facts label. If a food does not have a Nutrition Facts label, try to look up the calories online or ask your dietitian for help. Use smaller plates, glasses, and bowls for smaller portions and to prevent overeating. Use your calories on foods and drinks that will fill you up and not leave you hungry shortly after a meal. This information is not intended to replace advice given to you by your health care provider. Make sure you discuss any questions you have with your health care provider. Document Revised: 03/06/2019 Document Reviewed: 03/06/2019 Elsevier Patient Education  2023 Elsevier Inc.  

## 2021-07-05 NOTE — Telephone Encounter (Signed)
Pharmacy states they have questions about the quantity.  tirzepatide Greggory Keen) 10 MG/0.5ML Pen   [id number] - [place], [place] - [address]   [address], [place] [address]  Phone:  [phone]  Fax:  [phone]

## 2021-07-05 NOTE — Assessment & Plan Note (Addendum)
mounjaro refilled at 10 mg  F/u 3 months   Pt is on metformin but it alone was not controlling his blood sugar

## 2021-07-06 LAB — VITAMIN D 25 HYDROXY (VIT D DEFICIENCY, FRACTURES): VITD: 37.08 ng/mL (ref 30.00–100.00)

## 2021-07-06 LAB — CBC WITH DIFFERENTIAL/PLATELET
Basophils Absolute: 0.1 10*3/uL (ref 0.0–0.1)
Basophils Relative: 1 % (ref 0.0–3.0)
Eosinophils Absolute: 0.1 10*3/uL (ref 0.0–0.7)
Eosinophils Relative: 1 % (ref 0.0–5.0)
HCT: 46.8 % (ref 39.0–52.0)
Hemoglobin: 15.8 g/dL (ref 13.0–17.0)
Lymphocytes Relative: 21.4 % (ref 12.0–46.0)
Lymphs Abs: 2.9 10*3/uL (ref 0.7–4.0)
MCHC: 33.7 g/dL (ref 30.0–36.0)
MCV: 85.7 fl (ref 78.0–100.0)
Monocytes Absolute: 1 10*3/uL (ref 0.1–1.0)
Monocytes Relative: 7.3 % (ref 3.0–12.0)
Neutro Abs: 9.3 10*3/uL — ABNORMAL HIGH (ref 1.4–7.7)
Neutrophils Relative %: 69.3 % (ref 43.0–77.0)
Platelets: 303 10*3/uL (ref 150.0–400.0)
RBC: 5.45 Mil/uL (ref 4.22–5.81)
RDW: 13.2 % (ref 11.5–15.5)
WBC: 13.4 10*3/uL — ABNORMAL HIGH (ref 4.0–10.5)

## 2021-07-06 LAB — COMPREHENSIVE METABOLIC PANEL
ALT: 21 U/L (ref 0–53)
AST: 18 U/L (ref 0–37)
Albumin: 4.5 g/dL (ref 3.5–5.2)
Alkaline Phosphatase: 59 U/L (ref 39–117)
BUN: 19 mg/dL (ref 6–23)
CO2: 31 mEq/L (ref 19–32)
Calcium: 10.3 mg/dL (ref 8.4–10.5)
Chloride: 99 mEq/L (ref 96–112)
Creatinine, Ser: 1.01 mg/dL (ref 0.40–1.50)
GFR: 89.18 mL/min (ref >60.00)
Glucose, Bld: 81 mg/dL (ref 70–99)
Potassium: 3.6 mEq/L (ref 3.5–5.1)
Sodium: 139 mEq/L (ref 135–145)
Total Bilirubin: 0.7 mg/dL (ref 0.2–1.2)
Total Protein: 7.1 g/dL (ref 6.0–8.3)

## 2021-07-06 LAB — LIPID PANEL
Cholesterol: 175 mg/dL (ref 0–200)
HDL: 52.4 mg/dL (ref >39.00)
LDL Cholesterol: 109 mg/dL — ABNORMAL HIGH (ref 0–99)
NonHDL: 122.5
Total CHOL/HDL Ratio: 3
Triglycerides: 70 mg/dL (ref 0.0–149.0)
VLDL: 14 mg/dL (ref 0.0–40.0)

## 2021-07-06 LAB — MICROALBUMIN / CREATININE URINE RATIO
Creatinine,U: 90.4 mg/dL
Microalb Creat Ratio: 1.6 mg/g (ref 0.0–30.0)
Microalb, Ur: 1.4 mg/dL (ref 0.0–1.9)

## 2021-07-06 LAB — HEMOGLOBIN A1C: Hgb A1c MFr Bld: 5.4 % (ref 4.6–6.5)

## 2021-07-06 LAB — TSH: TSH: 2.13 u[IU]/mL (ref 0.35–5.50)

## 2021-07-06 NOTE — Telephone Encounter (Signed)
Spoke with pharmacy and clarified quantity

## 2021-07-07 ENCOUNTER — Telehealth: Payer: Self-pay

## 2021-07-07 DIAGNOSIS — E1165 Type 2 diabetes mellitus with hyperglycemia: Secondary | ICD-10-CM

## 2021-07-07 NOTE — Telephone Encounter (Signed)
Key: [id number]   Sent to plan

## 2021-07-13 ENCOUNTER — Encounter: Payer: Self-pay | Admitting: Family Medicine

## 2021-07-13 NOTE — Telephone Encounter (Signed)
Ins was termed on 07/06/21.  Prior Berkley Harvey was started on 07/07/21.  Left message on machine for patient to call back with pharmacy card information if he has new ins.

## 2021-07-14 NOTE — Telephone Encounter (Addendum)
Ins info received via mychart.  Prior auth started via cover my meds.  Awaiting determination.  Key: [id number]

## 2021-07-14 NOTE — Telephone Encounter (Signed)
Per cover my meds had to use [employer].  Prior auth started (did not get pa number).

## 2021-07-20 ENCOUNTER — Telehealth: Payer: Self-pay | Admitting: Family Medicine

## 2021-07-20 NOTE — Telephone Encounter (Signed)
Sent patient MyChart message.

## 2021-07-20 NOTE — Telephone Encounter (Signed)
Patient called in regards to his hip pain and mobility. He said that he was advised to loose weight to be a candidate for a hip replacement. He has lost 55 pounds so far and has about 70 more to go. He asked if there were any other options to help with his pain and mobility (he mentioned a chiropractor)?  Please advise.

## 2021-07-20 NOTE — Telephone Encounter (Signed)
He can try the chiropractor but don't know if it would help but worht a try

## 2021-07-20 NOTE — Telephone Encounter (Signed)
Spoke with ins and they are going to send over a questionnaire.  Chart note must say that he has tried and failed metformin containing drug.

## 2021-07-21 ENCOUNTER — Other Ambulatory Visit: Payer: Self-pay | Admitting: Family Medicine

## 2021-07-21 NOTE — Telephone Encounter (Signed)
Please document in last ov that patient has tried and failed metformin.  Pt is currently metformin.

## 2021-07-21 NOTE — Telephone Encounter (Signed)
Other prior auth was withdrawn and I restarted one with notes attached.  Prior Berkley Harvey was done through [employer].  EOC number [id number].

## 2021-07-26 NOTE — Telephone Encounter (Signed)
Checked on status and updated patient.  Prior Berkley Harvey is still in progress.

## 2021-07-28 NOTE — Telephone Encounter (Signed)
Greggory Keen has been approved from 07/27/21-07/27/22.

## 2021-07-30 ENCOUNTER — Encounter: Payer: Self-pay | Admitting: Family Medicine

## 2021-08-02 ENCOUNTER — Encounter: Payer: Self-pay | Admitting: Family Medicine

## 2021-08-02 DIAGNOSIS — M25551 Pain in right hip: Secondary | ICD-10-CM

## 2021-08-03 MED ORDER — TIRZEPATIDE 12.5 MG/0.5ML ~~LOC~~ SOAJ: 12.5000 mg | SUBCUTANEOUS | 0 refills | Status: DC

## 2021-08-03 NOTE — Addendum Note (Signed)
Addended by: Roxanne Gates on: 08/03/2021 08:49 AM   Modules accepted: Orders

## 2021-08-03 NOTE — Telephone Encounter (Signed)
Okay to place new referral? 

## 2021-08-05 ENCOUNTER — Encounter: Payer: Self-pay | Admitting: Family Medicine

## 2021-09-13 NOTE — Progress Notes (Unsigned)
[place] [facility] [place] Phone: [phone] Subjective:   Sean [name], am serving as a scribe for Dr. Hulan Saas.  I'm seeing this patient by the request  of:  Ann Held, DO  CC: Right hip pain  [name]  06/23/2021 Patient given injection and tolerated the procedure well.  Encourage patient to discuss with primary care if there is any room to increase the [facility].  Discussed with patient about his goal weight and what is going towards so he can be a candidate for the hip replacement and we will review refer accordingly f/u in 12 weeks   Update 09/15/2021 Sean [name] is a 46 y.o. male coming in with complaint of R hip pain. Patient states that his pain continues in R groin. Patient has increased his Mounjaro rx. Patient is experiencing numbness and shoot pain into the R leg. Injection last visit only provided him with 2 days of relief.  Patient states that it is affecting daily activities.  Continuing to try to lose weight.  Is on Mounjaro.  States that it is affecting all daily activities including his sleep.  Even getting out of the car has become significantly more difficult.      Past Medical History:  Diagnosis Date   Heartburn    Hyperlipidemia    Hypertension    Sleep apnea    SOB (shortness of breath) on exertion    Swelling    feet and legs   [name]. Social History   Socioeconomic History   Marital status: Married    Spouse name: Not on file   Number of children: Not on file   Years of education: Not on file   Highest education level: Not on file  Occupational History   Occupation: Chief Executive Officer  Tobacco Use   Smoking status: Never   Smokeless tobacco: Never  Substance and Sexual Activity   Alcohol use: Yes   Drug use: [name]   Sexual activity: Yes  Other Topics Concern   Not on file  Social History Narrative   Not on file   Social  Determinants of Health   Financial Resource Strain: Not on file  Food Insecurity: Not on file  Transportation Needs: Not on file  Physical Activity: Not on file  Stress: Not on file  Social Connections: Not on file   [name] Family History  Problem Relation Age of Onset   Heart disease Father 59       MI   Hypertension Father    Sudden death Father        Heart disease   Hyperlipidemia Father    Obesity Father    Diabetes Mother        Borderline   Hyperlipidemia Mother    Hypertension Mother    Depression Mother    Obesity Mother    Breast cancer Maternal Grandmother     Current Outpatient Medications (Endocrine & Metabolic):    metFORMIN (GLUCOPHAGE) 500 MG tablet, Take 1 tablet (500 mg total) by mouth 2 (two) times daily with a meal.   tirzepatide (MOUNJARO) 10 MG/0.5ML Pen, Inject 10 mg into the skin once a week.   tirzepatide [facility]) 12.5 MG/0.5ML Pen, Inject 12.5 mg into the skin once a week.  Current Outpatient Medications (Cardiovascular):    atorvastatin (LIPITOR) 40 MG tablet, Take 1 tablet (40 mg total) by mouth daily.   chlorthalidone (HYGROTON) 25  MG tablet, Take 1 tablet (25 mg total) by mouth daily.   fenofibrate 160 MG tablet, Take 1 tablet (160 mg total) by mouth daily.   metoprolol succinate (TOPROL-XL) 100 MG 24 hr tablet, TAKE 1 & 1/2 (ONE & ONE-HALF) TABLETS BY MOUTH ONCE DAILY   valsartan (DIOVAN) 160 MG tablet, Take 1 tablet (160 mg total) by mouth daily.   Current Outpatient Medications (Analgesics):    celecoxib (CELEBREX) 200 MG capsule, Take 1 capsule (200 mg total) by mouth 2 (two) times daily as needed.   meloxicam (MOBIC) 15 MG tablet, Take 1 tablet (15 mg total) by mouth daily.  Current Outpatient Medications (Hematological):    cyanocobalamin 1000 MCG tablet, Take 1,000 mcg by mouth daily.  Current Outpatient Medications (Other):    Ascorbic Acid (VITAMIN C) 1000 MG tablet, Take 1,000 mg by mouth daily.   blood glucose  meter kit and supplies KIT, Dispense based on patient and insurance preference. Use up to four times daily as directed. (FOR ICD-9 250.00, 250.01).   DULoxetine (CYMBALTA) 20 MG capsule, Take 1 capsule (20 mg total) by mouth daily.   Multiple Vitamin (MULTIVITAMIN) capsule, Take 1 capsule by mouth daily.   NONFORMULARY OR COMPOUNDED ITEM, Compression socks #1  As directed   Dx low ext edema   Vitamin D, Ergocalciferol, (DRISDOL) 1.25 MG (50000 UNIT) CAPS capsule, Take 1 capsule (50,000 Units total) by mouth once a week.   Reviewed prior external information including notes and imaging from  primary care provider As well as notes that were available from care everywhere and other healthcare systems.  Past medical history, social, surgical and family history all reviewed in electronic medical record.  [name].   Review of Systems:  [name], visual changes, nausea, vomiting, diarrhea, constipation, dizziness, abdominal pain, skin rash, fevers, chills, night sweats, weight loss, swollen lymph nodes, body aches, joint swelling, chest pain, shortness of breath, mood changes. POSITIVE muscle aches  Objective  Blood pressure (!) 142/90, pulse 68, height 6' 1" (1.854 m), weight (!) 362 lb (164.2 kg), SpO2 99 %.   General: [name], dressed appropriately.  HEENT: Pupils equal, extraocular movements intact  Respiratory: Patient's speak in full sentences and does not appear short of breath  Cardiovascular: [name], non tender, [name]  Severely antalgic gait walking with an externally rotated right leg. Right hip exam has a less than 5 degrees of internal rotation.  Patient does have some mild weakness with hip abduction and hip flexion compared to the contralateral side.  Procedure: Real-time Ultrasound Guided Injection of right hip Device: GE Logiq Q7   Ultrasound guided injection is preferred based studies that show increased duration, increased effect, greater accuracy, decreased procedural pain, increased response rate with ultrasound guided versus blind injection.  Verbal informed consent obtained.  Time-out conducted.  Noted [name] overlying erythema, induration, or other signs of local infection.  Skin prepped in a sterile fashion.  Local anesthesia: Topical Ethyl chloride.  With sterile technique and under real time ultrasound guidance:  Anterior capsule visualized, needle visualized going to the head neck junction at the anterior capsule. Pictures taken. Patient did have injection of  2 cc of 0.5% Marcaine, and 1 cc of Kenalog 40 mg/dL. Completed without difficulty  Pain immediately improved suggesting accurate placement of the medication.  Advised to call if fevers/chills, erythema, induration, drainage, or persistent bleeding.  Impression: Technically successful ultrasound guided injection.    Impression and Recommendations:    The above documentation has been reviewed and is accurate and complete Lyndal Pulley, DO

## 2021-09-15 ENCOUNTER — Ambulatory Visit: Payer: 59 | Admitting: Family Medicine

## 2021-09-15 ENCOUNTER — Encounter: Payer: Self-pay | Admitting: Family Medicine

## 2021-09-15 ENCOUNTER — Ambulatory Visit: Payer: Self-pay

## 2021-09-15 VITALS — BP 142/90 | HR 68 | Ht 73.0 in | Wt 362.0 lb

## 2021-09-15 DIAGNOSIS — M1611 Unilateral primary osteoarthritis, right hip: Secondary | ICD-10-CM

## 2021-09-15 DIAGNOSIS — M25551 Pain in right hip: Secondary | ICD-10-CM | POA: Diagnosis not present

## 2021-09-15 MED ORDER — MELOXICAM 15 MG PO TABS: 15.0000 mg | ORAL_TABLET | Freq: Every day | ORAL | 0 refills | Status: DC

## 2021-09-15 MED ORDER — DULOXETINE HCL 20 MG PO CPEP: 20.0000 mg | ORAL_CAPSULE | Freq: Every day | ORAL | 0 refills | Status: DC

## 2021-09-15 NOTE — Patient Instructions (Signed)
Cymbalta 20mg  Injected hip today Dr. See me again in

## 2021-09-15 NOTE — Assessment & Plan Note (Signed)
Chronic, with worsening symptoms.  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  Will refer patient to orthopedic surgery to discuss surgical intervention.  Patient is continuing to try to lose weight.  Will see if there is a possibility of any surface or secondary to the degenerative labrum that could be taken arthroscopically before a replacement secondary to patient's age.  Patient understands and sits in habitable that likely will need a knee replacement at some point.  Patient has been working hard on the weight loss and encouraged him to do so.  Does have the Celebrex, started on Cymbalta to see if this will be helpful as well or the meloxicam refilled.  Discussed not taking the Celebrex and the meloxicam at the same time.  Follow-up with me again in 10 to 12 weeks otherwise.

## 2021-09-18 ENCOUNTER — Other Ambulatory Visit: Payer: Self-pay | Admitting: Family Medicine

## 2021-09-18 DIAGNOSIS — E559 Vitamin D deficiency, unspecified: Secondary | ICD-10-CM

## 2021-09-22 ENCOUNTER — Ambulatory Visit (INDEPENDENT_AMBULATORY_CARE_PROVIDER_SITE_OTHER): Payer: 59 | Admitting: Orthopaedic Surgery

## 2021-09-22 DIAGNOSIS — M1611 Unilateral primary osteoarthritis, right hip: Secondary | ICD-10-CM | POA: Diagnosis not present

## 2021-09-22 NOTE — Progress Notes (Signed)
Chief Complaint: Right hip pain     History of Present Illness:    Sean [name] is a 46 y.o. male$RemoveBef' \\presents'[id number]$  with right hip pain over the course of the last several years.  He has had several injections into the right hip most recently with Dr. Tamala Julian which has given him decent relief.  He is taking Celebrex as well as Tylenol in order to get some form of relief.  He does state that he did gain weight during COVID and has subsequently been able to lose 60 pounds.  He has difficulty dressing or even putting on socks at this point.  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  He has had PT but this seemed to just aggravate the pain more than anything.  He is here today for further assessment.  He does experience pain at baseline and even while at rest.  He has extremely limited motion at this time.    Surgical History:   None  PMH/PSH/Family History/Social History/Meds/Allergies:    Past Medical History:  Diagnosis Date   Heartburn    Hyperlipidemia    Hypertension    Sleep apnea    SOB (shortness of breath) on exertion    Swelling    feet and legs   No past surgical history on file. Social History   Socioeconomic History   Marital status: Married    Spouse name: Not on file   Number of children: Not on file   Years of education: Not on file   Highest education level: Not on file  Occupational History   Occupation: Chief Executive Officer  Tobacco Use   Smoking status: Never   Smokeless tobacco: Never  Substance and Sexual Activity   Alcohol use: Yes   Drug use: No   Sexual activity: Yes  Other Topics Concern   Not on file  Social History Narrative   Not on file   Social Determinants of Health   Financial Resource Strain: Not on file  Food Insecurity: Not on file  Transportation Needs: Not on file  Physical Activity: Not on file  Stress: Not on file  Social Connections: Not on file   Family History  Problem Relation  Age of Onset   Heart disease Father 24       MI   Hypertension Father    Sudden death Father        Heart disease   Hyperlipidemia Father    Obesity Father    Diabetes Mother        Borderline   Hyperlipidemia Mother    Hypertension Mother    Depression Mother    Obesity Mother    Breast cancer Maternal Grandmother    No Known Allergies Current Outpatient Medications  Medication Sig Dispense Refill   Ascorbic Acid (VITAMIN C) 1000 MG tablet Take 1,000 mg by mouth daily.     atorvastatin (LIPITOR) 40 MG tablet Take 1 tablet (40 mg total) by mouth daily. 90 tablet 3   blood glucose meter kit and supplies KIT Dispense based on patient and insurance preference. Use up to four times daily as directed. (FOR ICD-9 250.00, 250.01). 1 each 0   celecoxib (CELEBREX) 200 MG capsule Take 1 capsule (200 mg total) by mouth 2 (two) times daily as needed. 60 capsule  6   chlorthalidone (HYGROTON) 25 MG tablet Take 1 tablet (25 mg total) by mouth daily. 90 tablet 1   cyanocobalamin 1000 MCG tablet Take 1,000 mcg by mouth daily.     DULoxetine (CYMBALTA) 20 MG capsule Take 1 capsule (20 mg total) by mouth daily. 30 capsule 0   fenofibrate 160 MG tablet Take 1 tablet (160 mg total) by mouth daily. 90 tablet 3   meloxicam (MOBIC) 15 MG tablet Take 1 tablet (15 mg total) by mouth daily. 30 tablet 0   metFORMIN (GLUCOPHAGE) 500 MG tablet Take 1 tablet (500 mg total) by mouth 2 (two) times daily with a meal. 180 tablet 1   metoprolol succinate (TOPROL-XL) 100 MG 24 hr tablet TAKE 1 & 1/2 (ONE & ONE-HALF) TABLETS BY MOUTH ONCE DAILY 135 tablet 1   Multiple Vitamin (MULTIVITAMIN) capsule Take 1 capsule by mouth daily.     NONFORMULARY OR COMPOUNDED ITEM Compression socks #1  As directed   Dx low ext edema 1 each 0   tirzepatide (MOUNJARO) 10 MG/0.5ML Pen Inject 10 mg into the skin once a week. 18 mL 0   tirzepatide (MOUNJARO) 12.5 MG/0.5ML Pen Inject 12.5 mg into the skin once a week. 18 mL 0   valsartan  (DIOVAN) 160 MG tablet Take 1 tablet (160 mg total) by mouth daily. 90 tablet 2   Vitamin D, Ergocalciferol, (DRISDOL) 1.25 MG (50000 UNIT) CAPS capsule Take 1 capsule by mouth once a week 12 capsule 0   No current facility-administered medications for this visit.   No results found.  Review of Systems:   A ROS was performed including pertinent positives and negatives as documented in the HPI.  Physical Exam :   Constitutional: NAD and appears stated age Neurological: Alert and oriented Psych: Appropriate affect and cooperative There were no vitals taken for this visit.   Comprehensive Musculoskeletal Exam:    Inspection Right Left  Skin No atrophy or gross abnormalities appreciated No atrophy or gross abnormalities appreciated  Palpation    Tenderness Hardware acetabular None  Crepitus None None  Range of Motion    Flexion (passive) 120 120  Extension 30 30  IR 10 with pain 20  ER 20 with pain 45  Strength    Flexion  5/5 5/5  Extension 5/5 5/5  Special Tests    FABER Negative Negative  FADIR Negative Negative  ER Lag/Capsular Insufficiency Negative Negative  Instability Negative Negative  Sacroiliac pain Negative  Negative   Instability    Generalized Laxity No No  Neurologic    sciatic, femoral, obturator nerves intact to light sensation  Vascular/Lymphatic    DP pulse 2+ 2+  Lumbar Exam    Patient has symmetric lumbar range of motion with negative pain referral to hip     Imaging:   Xray (AP pelvis, 2 views right hip): There is significant joint space narrowing consistent with superior source heel osteoarthritis.  Lateral center edge angle is grossly diminished compared to the contralateral side  MRI (right hip): Significant cartilage loss appreciated in the superior dome with degenerative tearing of the labrum  I personally reviewed and interpreted the radiographs.   Assessment:   46 y.o. male with osteoarthritis and degenerative joint disease of the  right hip.  Overall I have described that unfortunately I do not believe that hip preservation surgery would produce a durable and positive outcome.  Particularly given the fact that he does have pain even while at rest as opposed to  positions of impingement as well as the fact that he has very limited range of motion which again hip preservation surgery would be unlikely to improve.  I do believe that he would benefit from a total hip arthroplasty.  We did discuss the implications of his weight on this.  We did discuss that with a BMI greater than 40 he would be at significantly higher risk for infection or need for component revision replacement at an early age which is already problematic given the fact that he is so young.  He is already working on weight loss and is doing quite a good job with this.  We did discuss the role for intermittent injections in the right hip to allow him with his activity.  I did discuss low impact aerobic exercise like pool and elliptical versus stationary bike which I believe would allow him to continue his weight loss journey with overall minimal pain to the hip.  He will continue to work on this I will plan to see him back in 3 months for weight check and reassessment we will also perform injections as needed  Plan :    -Return to clinic in 3 months     I personally saw and evaluated the patient, and participated in the management and treatment plan.  Vanetta Mulders, MD Attending Physician, Orthopedic Surgery  This document was dictated using Dragon voice recognition software. A reasonable attempt at proof reading has been made to minimize errors.

## 2021-10-06 ENCOUNTER — Ambulatory Visit: Payer: BC Managed Care – PPO | Admitting: Family Medicine

## 2021-10-14 ENCOUNTER — Telehealth: Payer: Self-pay | Admitting: Family Medicine

## 2021-10-14 MED ORDER — DULOXETINE HCL 20 MG PO CPEP: 20.0000 mg | ORAL_CAPSULE | Freq: Every day | ORAL | 0 refills | Status: DC

## 2021-10-14 MED ORDER — MELOXICAM 15 MG PO TABS: 15.0000 mg | ORAL_TABLET | Freq: Every day | ORAL | 0 refills | Status: DC

## 2021-10-14 NOTE — Telephone Encounter (Signed)
Patient called asking for a refill on  DULoxetine (CYMBALTA) 20 MG capsule  to be sent to [facility] - [place] [address], [facility] [address] [place] [address].  Please advise.

## 2021-10-17 NOTE — Telephone Encounter (Signed)
Spoke with pharmacy, he picked up prescriptions today already

## 2021-10-17 NOTE — Telephone Encounter (Signed)
Patient called this morning stating that the pharmacy did not receive these two medications that were sent on Friday. Can they please be resent?

## 2021-11-21 ENCOUNTER — Telehealth: Payer: Self-pay

## 2021-11-21 ENCOUNTER — Other Ambulatory Visit: Payer: Self-pay | Admitting: Family Medicine

## 2021-11-21 MED ORDER — DULOXETINE HCL 20 MG PO CPEP: 20.0000 mg | ORAL_CAPSULE | Freq: Every day | ORAL | 0 refills | Status: DC

## 2021-11-21 NOTE — Telephone Encounter (Signed)
Patient called stating he needed a refill on the Cymbalta. Will get that sent in for patient. Patient also wanting the number to the surgeon to discuss the gastric bypass surgery so he can be able to get his hip surgery in the future that number was given to the patient as well.

## 2021-11-22 MED ORDER — MELOXICAM 15 MG PO TABS: 15.0000 mg | ORAL_TABLET | Freq: Every day | ORAL | 0 refills | Status: DC

## 2021-11-29 NOTE — Progress Notes (Unsigned)
[place] Phone: [phone] Subjective:    I'm seeing this patient by the request  of:  Ann Held, DO  CC: Right hip pain  [id number]  09/15/2021 Chronic, with worsening symptoms.  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  Will refer patient to orthopedic surgery to discuss surgical intervention.  Patient is continuing to try to lose weight.  Will see if there is a possibility of any surface or secondary to the degenerative labrum that could be taken arthroscopically before a replacement secondary to patient's age.  Patient understands and sits in habitable that likely will need a knee replacement at some point.  Patient has been working hard on the weight loss and encouraged him to do so.  Does have the Celebrex, started on Cymbalta to see if this will be helpful as well or the meloxicam refilled.  Discussed not taking the Celebrex and the meloxicam at the same time.  Follow-up with me again in 10 to 12 weeks otherwise.  Update 12/01/2021 Sean [name] is a 46 y.o. male coming in with complaint of R hip pain. Saw Dr. Sammuel Hines who recommends THR once patient has decreased BMI. Patient states that he would like to discuss weight loss surgery so that he can get hip replaced. Last week patient was struggling to get into his truck and "tweeked" hip. More pain in groin and feels as if it is swollen. Cymbalta has been helpful.        Past Medical History:  Diagnosis Date   Heartburn    Hyperlipidemia    Hypertension    Sleep apnea    SOB (shortness of breath) on exertion    Swelling    feet and legs   No past surgical history on file. Social History   Socioeconomic History   Marital status: Married    Spouse name: Not on file   Number of children: Not on file   Years of education: Not on file   Highest education level: Not on file   Occupational History   Occupation: Chief Executive Officer  Tobacco Use   Smoking status: Never   Smokeless tobacco: Never  Substance and Sexual Activity   Alcohol use: Yes   Drug use: No   Sexual activity: Yes  Other Topics Concern   Not on file  Social History Narrative   Not on file   Social Determinants of Health   Financial Resource Strain: Not on file  Food Insecurity: Not on file  Transportation Needs: Not on file  Physical Activity: Not on file  Stress: Not on file  Social Connections: Not on file   No Known Allergies Family History  Problem Relation Age of Onset   Heart disease Father 6       MI   Hypertension Father    Sudden death Father        Heart disease   Hyperlipidemia Father    Obesity Father    Diabetes Mother        Borderline   Hyperlipidemia Mother    Hypertension Mother    Depression Mother    Obesity Mother    Breast cancer Maternal Grandmother     Current Outpatient Medications (Endocrine & Metabolic):    metFORMIN (GLUCOPHAGE) 500 MG tablet, Take 1 tablet (500 mg total) by mouth 2 (two) times daily with a meal.   tirzepatide [facility])  10 MG/0.5ML Pen, Inject 10 mg into the skin once a week.   tirzepatide [facility]) 12.5 MG/0.5ML Pen, Inject 12.5 mg into the skin once a week.  Current Outpatient Medications (Cardiovascular):    atorvastatin (LIPITOR) 40 MG tablet, Take 1 tablet (40 mg total) by mouth daily.   chlorthalidone (HYGROTON) 25 MG tablet, Take 1 tablet (25 mg total) by mouth daily.   fenofibrate 160 MG tablet, Take 1 tablet (160 mg total) by mouth daily.   metoprolol succinate (TOPROL-XL) 100 MG 24 hr tablet, TAKE 1 & 1/2 (ONE & ONE-HALF) TABLETS BY MOUTH ONCE DAILY   valsartan (DIOVAN) 160 MG tablet, Take 1 tablet (160 mg total) by mouth daily.   Current Outpatient Medications (Analgesics):    celecoxib (CELEBREX) 200 MG capsule, Take 1 capsule (200 mg total) by mouth 2 (two) times daily as needed.   meloxicam (MOBIC) 15  MG tablet, Take 1 tablet (15 mg total) by mouth daily.  Current Outpatient Medications (Hematological):    cyanocobalamin 1000 MCG tablet, Take 1,000 mcg by mouth daily.  Current Outpatient Medications (Other):    Ascorbic Acid (VITAMIN C) 1000 MG tablet, Take 1,000 mg by mouth daily.   blood glucose meter kit and supplies KIT, Dispense based on patient and insurance preference. Use up to four times daily as directed. (FOR ICD-9 250.00, 250.01).   DULoxetine (CYMBALTA) 20 MG capsule, Take 2 capsules (40 mg total) by mouth daily.   Multiple Vitamin (MULTIVITAMIN) capsule, Take 1 capsule by mouth daily.   NONFORMULARY OR COMPOUNDED ITEM, Compression socks #1  As directed   Dx low ext edema   Vitamin D, Ergocalciferol, (DRISDOL) 1.25 MG (50000 UNIT) CAPS capsule, Take 1 capsule by mouth once a week   Reviewed prior external information including notes and imaging from  primary care provider As well as notes that were available from care everywhere and other healthcare systems.  Past medical history, social, surgical and family history all reviewed in electronic medical record.  No pertanent information unless stated regarding to the chief complaint.   Review of Systems:  No headache, visual changes, nausea, vomiting, diarrhea, constipation, dizziness, abdominal pain, skin rash, fevers, chills, night sweats, weight loss, swollen lymph nodes, body aches, joint swelling, chest pain, shortness of breath, mood changes. POSITIVE muscle aches  Objective  Blood pressure (!) 132/100, pulse 70, height $RemoveBe'6\' 1"'HEWWzZpef$  (1.854 m), weight (!) 350 lb (158.8 kg), SpO2 98 %.   General: No apparent distress alert and oriented x3 mood and affect normal, dressed appropriately.  Morbidly obese HEENT: Pupils equal, extraocular movements intact  Respiratory: Patient's speak in full sentences and does not appear short of breath  Cardiovascular: No lower extremity edema, non tender, no erythema   Antalgic gait favoring the  right hip.  Patient walks with an externally rotated leg.  Patient is tender to palpation noted.  Procedure: Real-time Ultrasound Guided Injection of right hip Device: GE Logiq Q7  Ultrasound guided injection is preferred based studies that show increased duration, increased effect, greater accuracy, decreased procedural pain, increased response rate with ultrasound guided versus blind injection.  Verbal informed consent obtained.  Time-out conducted.  Noted no overlying erythema, induration, or other signs of local infection.  Skin prepped in a sterile fashion.  Local anesthesia: Topical Ethyl chloride.  With sterile technique and under real time ultrasound guidance:  Anterior capsule visualized, needle visualized going to the head neck junction at the anterior capsule. Pictures taken. Patient did have injection of  3 cc  of 0.5% Marcaine, and 1 cc of Kenalog 40 mg/dL. Completed without difficulty  Pain immediately resolved suggesting accurate placement of the medication.  Still significant decrease in range of motion. Advised to call if fevers/chills, erythema, induration, drainage, or persistent bleeding.  Impression: Technically successful ultrasound guided injection.    Impression and Recommendations:    The above documentation has been reviewed and is accurate and complete Lyndal Pulley, DO

## 2021-12-01 ENCOUNTER — Ambulatory Visit: Payer: Self-pay

## 2021-12-01 ENCOUNTER — Ambulatory Visit (INDEPENDENT_AMBULATORY_CARE_PROVIDER_SITE_OTHER): Payer: 59 | Admitting: Family Medicine

## 2021-12-01 ENCOUNTER — Encounter: Payer: Self-pay | Admitting: Family Medicine

## 2021-12-01 VITALS — BP 132/100 | HR 70 | Ht 73.0 in | Wt 350.0 lb

## 2021-12-01 DIAGNOSIS — E669 Obesity, unspecified: Secondary | ICD-10-CM

## 2021-12-01 DIAGNOSIS — M25551 Pain in right hip: Secondary | ICD-10-CM | POA: Diagnosis not present

## 2021-12-01 DIAGNOSIS — M1611 Unilateral primary osteoarthritis, right hip: Secondary | ICD-10-CM

## 2021-12-01 MED ORDER — DULOXETINE HCL 20 MG PO CPEP: 40.0000 mg | ORAL_CAPSULE | Freq: Every day | ORAL | 0 refills | Status: DC

## 2021-12-01 NOTE — Assessment & Plan Note (Signed)
Patient given injection again today.  Chronic problem with worsening symptoms.  Secondary to social determinants of health and patient's obesity he is not a candidate for replacement yet.  Patient does need to get the BMI under 40.  He has had difficulty with this.  Patient is interested in weight loss surgery and will be referred to discuss at this time.  Patient will follow-up with me again for this problem in 3 months if necessary.  We have discussed multiple times if worsening symptoms other management could be helpful such as increasing the duloxetine which has been with before we ended our visit he was okay with that we will increase his Cymbalta to 40 mg.

## 2021-12-01 NOTE — Patient Instructions (Signed)
See me in 8-10 weeks Referral put in for general surgery Injected hip today Cymbalta 40mg  called in to pharmacy

## 2021-12-16 ENCOUNTER — Other Ambulatory Visit: Payer: Self-pay | Admitting: *Deleted

## 2021-12-16 DIAGNOSIS — E1165 Type 2 diabetes mellitus with hyperglycemia: Secondary | ICD-10-CM

## 2021-12-16 MED ORDER — TIRZEPATIDE 12.5 MG/0.5ML ~~LOC~~ SOAJ
12.5000 mg | SUBCUTANEOUS | 2 refills | Status: DC
Start: 2021-12-16 — End: 2022-01-16

## 2021-12-23 ENCOUNTER — Ambulatory Visit (HOSPITAL_BASED_OUTPATIENT_CLINIC_OR_DEPARTMENT_OTHER): Payer: 59 | Admitting: Orthopaedic Surgery

## 2022-01-09 ENCOUNTER — Telehealth: Payer: Self-pay | Admitting: Family Medicine

## 2022-01-09 NOTE — Telephone Encounter (Signed)
Patient will try 60mg  of Cymbalta and then give a call in 1 week if no improvement.

## 2022-01-09 NOTE — Telephone Encounter (Signed)
Pt struggling with R hip/R leg pain. Last seen 10/26 and given injection.   Working full time, on feet a lot. Still trying to get hip replacement/weight loss surgery.  Taking Meloxicam and new dose of Cymbalta.  Wondering what other option he might have for improvement or pain control. The surgeries will not be for months and he has to continue to work.

## 2022-01-09 NOTE — Telephone Encounter (Signed)
Consider increase cymbalta to 60 mg  And then if not better I could send in some tramadol but would be a narcotic and need to be careful

## 2022-01-12 ENCOUNTER — Other Ambulatory Visit: Payer: Self-pay | Admitting: Family Medicine

## 2022-01-12 DIAGNOSIS — I10 Essential (primary) hypertension: Secondary | ICD-10-CM

## 2022-01-15 ENCOUNTER — Other Ambulatory Visit: Payer: Self-pay | Admitting: Family Medicine

## 2022-01-16 ENCOUNTER — Other Ambulatory Visit: Payer: Self-pay | Admitting: Family Medicine

## 2022-01-16 ENCOUNTER — Other Ambulatory Visit: Payer: Self-pay | Admitting: *Deleted

## 2022-01-16 MED ORDER — TIRZEPATIDE 12.5 MG/0.5ML ~~LOC~~ SOAJ: 12.5000 mg | SUBCUTANEOUS | 2 refills | Status: DC

## 2022-01-17 MED ORDER — MELOXICAM 15 MG PO TABS: 15.0000 mg | ORAL_TABLET | Freq: Every day | ORAL | 0 refills | Status: DC

## 2022-01-22 ENCOUNTER — Encounter: Payer: Self-pay | Admitting: Family Medicine

## 2022-01-22 DIAGNOSIS — E785 Hyperlipidemia, unspecified: Secondary | ICD-10-CM

## 2022-01-22 DIAGNOSIS — E559 Vitamin D deficiency, unspecified: Secondary | ICD-10-CM

## 2022-01-22 DIAGNOSIS — I10 Essential (primary) hypertension: Secondary | ICD-10-CM

## 2022-01-23 MED ORDER — CHLORTHALIDONE 25 MG PO TABS: 25.0000 mg | ORAL_TABLET | Freq: Every day | ORAL | 1 refills | Status: DC

## 2022-01-23 MED ORDER — ATORVASTATIN CALCIUM 40 MG PO TABS: 40.0000 mg | ORAL_TABLET | Freq: Every day | ORAL | 1 refills | Status: DC

## 2022-01-23 MED ORDER — VITAMIN D (ERGOCALCIFEROL) 1.25 MG (50000 UNIT) PO CAPS: 50000.0000 [IU] | ORAL_CAPSULE | ORAL | 2 refills | Status: DC

## 2022-02-10 ENCOUNTER — Other Ambulatory Visit: Payer: Self-pay | Admitting: Family Medicine

## 2022-02-10 DIAGNOSIS — I10 Essential (primary) hypertension: Secondary | ICD-10-CM

## 2022-02-28 NOTE — Progress Notes (Unsigned)
[place] Phone: [phone] Subjective:   Sean [name], am serving as a scribe for Dr. Hulan Saas.  I'm seeing this patient by the request  of:  Ann Held, DO  CC: Right hip pain  [id number]  12/01/2021 Patient given injection again today.  Chronic problem with worsening symptoms.  Secondary to social determinants of health and patient's obesity he is not a candidate for replacement yet.  Patient does need to get the BMI under 40.  He has had difficulty with this.  Patient is interested in weight loss surgery and will be referred to discuss at this time.  Patient will follow-up with me again for this problem in 3 months if necessary.  We have discussed multiple times if worsening symptoms other management could be helpful such as increasing the duloxetine which has been with before we ended our visit he was okay with that we will increase his Cymbalta to 40 mg.     Update 03/01/2022 Sean [name] is a 47 y.o. male coming in with complaint of R hip pain. Patient states that his pain seems to have moved up higher into his hip vs groin. Notes sensation of entire leg going to "sleep". Patient using 60mg  of Cymbalta. Has popping in hip that began 1 month ago. Popping worse when he places entire weight on R leg.   Worried that he is causing damage to L hip due to compensatory patterns.        Past Medical History:  Diagnosis Date   Heartburn    Hyperlipidemia    Hypertension    Sleep apnea    SOB (shortness of breath) on exertion    Swelling    feet and legs   [name]. Social History   Socioeconomic History   Marital status: Married    Spouse name: Not on file   Number of children: Not on file   Years of education: Not on file   Highest education level: Not on file  Occupational History   Occupation: Chief Executive Officer  Tobacco Use   Smoking status:  Never   Smokeless tobacco: Never  Substance and Sexual Activity   Alcohol use: Yes   Drug use: [name]   Sexual activity: Yes  Other Topics Concern   Not on file  Social History Narrative   Not on file   Social Determinants of Health   Financial Resource Strain: Not on file  Food Insecurity: Not on file  Transportation Needs: Not on file  Physical Activity: Not on file  Stress: Not on file  Social Connections: Not on file   [name] Family History  Problem Relation Age of Onset   Heart disease Father 33       MI   Hypertension Father    Sudden death Father        Heart disease   Hyperlipidemia Father    Obesity Father    Diabetes Mother        Borderline   Hyperlipidemia Mother    Hypertension Mother    Depression Mother    Obesity Mother    Breast cancer Maternal Grandmother     Current Outpatient Medications (Endocrine & Metabolic):    metFORMIN (GLUCOPHAGE) 500 MG tablet, Take 1 tablet (500 mg total) by mouth 2 (two) times daily with a meal.   tirzepatide (MOUNJARO) 12.5 MG/0.5ML Pen, Inject 12.5 mg into the  skin once a week.  Current Outpatient Medications (Cardiovascular):    atorvastatin (LIPITOR) 40 MG tablet, Take 1 tablet (40 mg total) by mouth daily.   chlorthalidone (HYGROTON) 25 MG tablet, Take 1 tablet (25 mg total) by mouth daily.   fenofibrate 160 MG tablet, Take 1 tablet (160 mg total) by mouth daily.   metoprolol succinate (TOPROL-XL) 100 MG 24 hr tablet, TAKE 1 & 1/2 (ONE & ONE-HALF) TABLETS BY MOUTH ONCE DAILY . MUST make appointment with PCP for future refills.   valsartan (DIOVAN) 160 MG tablet, Take 1 tablet (160 mg total) by mouth daily.   Current Outpatient Medications (Analgesics):    celecoxib (CELEBREX) 200 MG capsule, Take 1 capsule (200 mg total) by mouth 2 (two) times daily as needed.   meloxicam (MOBIC) 15 MG tablet, Take 1 tablet (15 mg total) by mouth daily.   meloxicam (MOBIC) 15 MG tablet, Take 1 tablet (15 mg total) by  mouth daily.  Current Outpatient Medications (Hematological):    cyanocobalamin 1000 MCG tablet, Take 1,000 mcg by mouth daily.  Current Outpatient Medications (Other):    Ascorbic Acid (VITAMIN C) 1000 MG tablet, Take 1,000 mg by mouth daily.   blood glucose meter kit and supplies KIT, Dispense based on patient and insurance preference. Use up to four times daily as directed. (FOR ICD-9 250.00, 250.01).   DULoxetine (CYMBALTA) 20 MG capsule, Take 2 capsules (40 mg total) by mouth daily.   Multiple Vitamin (MULTIVITAMIN) capsule, Take 1 capsule by mouth daily.   NONFORMULARY OR COMPOUNDED ITEM, Compression socks #1  As directed   Dx low ext edema   Vitamin D, Ergocalciferol, (DRISDOL) 1.25 MG (50000 UNIT) CAPS capsule, Take 1 capsule (50,000 Units total) by mouth once a week.   Reviewed prior external information including notes and imaging from  primary care provider As well as notes that were available from care everywhere and other healthcare systems.  Past medical history, social, surgical and family history all reviewed in electronic medical record.  [name].   Review of Systems:  [name], visual changes, nausea, vomiting, diarrhea, constipation, dizziness, abdominal pain, skin rash, fevers, chills, night sweats, weight loss, swollen lymph nodes,  joint swelling, chest pain, shortness of breath, mood changes. POSITIVE muscle aches, body aches  Objective  Blood pressure (!) 138/100, pulse 82, height 6\' 1"  (1.854 m), weight (!) 351 lb (159.2 kg), SpO2 98 %.   General: [name], dressed appropriately.  HEENT: Pupils equal, extraocular movements intact  Respiratory: Patient's speak in full sentences and does not appear short of breath  Cardiovascular: [name], non tender, [name]  Severe otalgic noted.  The patient has [name] internal range of  motion and actually more of it external rotation of the hip of 5 degrees now.  Patient does have given what appears to be shortening of the right leg compared to the contralateral side.  Patient has limited external range of motion of the right leg as well.    Impression and Recommendations:    The above documentation has been reviewed and is accurate and complete Lyndal Pulley, DO

## 2022-03-01 ENCOUNTER — Ambulatory Visit (INDEPENDENT_AMBULATORY_CARE_PROVIDER_SITE_OTHER): Payer: 59 | Admitting: Family Medicine

## 2022-03-01 ENCOUNTER — Encounter: Payer: Self-pay | Admitting: Family Medicine

## 2022-03-01 ENCOUNTER — Other Ambulatory Visit: Payer: Self-pay

## 2022-03-01 ENCOUNTER — Ambulatory Visit: Payer: Self-pay

## 2022-03-01 VITALS — BP 138/100 | HR 82 | Ht 73.0 in | Wt 351.0 lb

## 2022-03-01 DIAGNOSIS — M25551 Pain in right hip: Secondary | ICD-10-CM | POA: Diagnosis not present

## 2022-03-01 DIAGNOSIS — M1611 Unilateral primary osteoarthritis, right hip: Secondary | ICD-10-CM

## 2022-03-01 NOTE — Assessment & Plan Note (Signed)
Patient has significant arthritis of the hip noted.  Patient has done remarkably well and has lost 75 pounds.  I am concerned though with the significant loss of range of motion, how much it is affecting daily activities, as well as the nighttime pain that patient is going to worsen at the moment and we may miss an opportunity for the replacement.  Encouraged him to follow-up with Dr. Grayling Congress to see if he would be a surgical candidate at this point.  We do know his BMI is above 40 and we did discuss again about the risk of infection as well as failure but at this point I do not know if we have another option for him.  Held on the injection today so we would not delay surgery if it was a possibility.  Total time reviewing imaging and discussing with patient 32 minutes

## 2022-03-01 NOTE — Patient Instructions (Signed)
Will touch base with Dr. Sammuel Hines See me again as needed

## 2022-03-03 ENCOUNTER — Ambulatory Visit: Payer: 59 | Admitting: Family Medicine

## 2022-03-03 ENCOUNTER — Telehealth: Payer: Self-pay | Admitting: Family Medicine

## 2022-03-03 MED ORDER — MELOXICAM 15 MG PO TABS: 15.0000 mg | ORAL_TABLET | Freq: Every day | ORAL | 0 refills | Status: DC

## 2022-03-03 MED ORDER — DULOXETINE HCL 20 MG PO CPEP: 40.0000 mg | ORAL_CAPSULE | Freq: Every day | ORAL | 0 refills | Status: DC

## 2022-03-03 MED ORDER — TRAMADOL HCL 50 MG PO TABS: 50.0000 mg | ORAL_TABLET | Freq: Three times a day (TID) | ORAL | 0 refills | Status: AC | PRN

## 2022-03-03 NOTE — Telephone Encounter (Signed)
Done

## 2022-03-03 NOTE — Telephone Encounter (Signed)
Pt informed via phone.

## 2022-03-03 NOTE — Telephone Encounter (Signed)
Expected refills at visit this week, at pharmacy now. Informed we would try to do this today.  Cymbalta Meloxicam Tramadol  [facility] in [place].

## 2022-03-12 ENCOUNTER — Other Ambulatory Visit: Payer: Self-pay | Admitting: Family Medicine

## 2022-03-12 DIAGNOSIS — I10 Essential (primary) hypertension: Secondary | ICD-10-CM

## 2022-03-13 ENCOUNTER — Ambulatory Visit (INDEPENDENT_AMBULATORY_CARE_PROVIDER_SITE_OTHER): Payer: 59

## 2022-03-13 ENCOUNTER — Encounter: Payer: Self-pay | Admitting: Orthopaedic Surgery

## 2022-03-13 ENCOUNTER — Ambulatory Visit (INDEPENDENT_AMBULATORY_CARE_PROVIDER_SITE_OTHER): Payer: 59 | Admitting: Orthopaedic Surgery

## 2022-03-13 VITALS — Ht 70.5 in | Wt 344.6 lb

## 2022-03-13 DIAGNOSIS — M1611 Unilateral primary osteoarthritis, right hip: Secondary | ICD-10-CM

## 2022-03-13 DIAGNOSIS — M25551 Pain in right hip: Secondary | ICD-10-CM

## 2022-03-13 NOTE — Progress Notes (Signed)
The patient is a very pleasant 47 year old who is sent from Dr. Hulan Saas to evaluate and treat well-documented end-stage arthritis of the right hip.  He has not had x-rays in over a year so we did want to get new x-rays today.  He has been on a weight loss journey.  He was over 400 pounds at his highest.  He is down to 344 today but that is a BMI of 48.75.  He has debilitating right hip pain.  He has tried and failed all forms of conservative treatment.  He takes tramadol on occasion.  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  He has limited mobility and at this point his pain is 10 out of 10 with the right hip.  It is detrimentally affecting his mobility, his quality of life and his actives daily living.  This been going on for many years now.  I was able to review all of his notes within epic in terms of his past medical history.  He was able to bring [name] his medication list as well.  He is on a weight loss medication at this point which is Mounjaro.  On exam his right hip has significant limitations with internal and external rotation with severe pain in the groin with attempted rotation.  His left hip moves smoothly.  An AP pelvis and lateral of his right hip shows severe end-stage arthritis with bone-on-bone wear and complete loss of joint space.  There is no space remaining and there is osteophytes around the hip.  Left hip appears normal.  We had a long thorough discussion about total joint replacement surgery.  We need to document more weight loss before we can safely post this case given the high complication rate and BMI over 40 especially with his high BMI.  I did lay him in a supine position so I could see if we can get to his hip safely and he does have a larger soft tissue envelope anteriorly.  I gave him hand about hip replacement surgery and went over hip replacement model in detail.  He understands this is quite a complicated in the upper and that I cannot even post the surgery yet  until he does demonstrate more weight loss so I can document a weight loss journey.  With that being said I would like to see him back in 2 months for repeat weight and BMI calculation.  All questions and concerns were answered and addressed.

## 2022-03-21 ENCOUNTER — Ambulatory Visit: Payer: 59 | Admitting: Orthopaedic Surgery

## 2022-04-01 ENCOUNTER — Encounter: Payer: Self-pay | Admitting: Family Medicine

## 2022-04-03 MED ORDER — TRAMADOL HCL 50 MG PO TABS: 50.0000 mg | ORAL_TABLET | Freq: Four times a day (QID) | ORAL | 0 refills | Status: DC | PRN

## 2022-04-04 ENCOUNTER — Other Ambulatory Visit: Payer: Self-pay | Admitting: Family Medicine

## 2022-04-04 NOTE — Telephone Encounter (Signed)
Would you like Pt to increase to [facility] 15?

## 2022-04-13 ENCOUNTER — Other Ambulatory Visit: Payer: Self-pay | Admitting: Family Medicine

## 2022-04-13 DIAGNOSIS — I10 Essential (primary) hypertension: Secondary | ICD-10-CM

## 2022-05-09 ENCOUNTER — Other Ambulatory Visit: Payer: Self-pay | Admitting: Family Medicine

## 2022-05-09 DIAGNOSIS — I10 Essential (primary) hypertension: Secondary | ICD-10-CM

## 2022-05-15 ENCOUNTER — Encounter: Payer: Self-pay | Admitting: Orthopaedic Surgery

## 2022-05-15 ENCOUNTER — Ambulatory Visit: Payer: 59 | Admitting: Orthopaedic Surgery

## 2022-05-15 ENCOUNTER — Ambulatory Visit (INDEPENDENT_AMBULATORY_CARE_PROVIDER_SITE_OTHER): Payer: 59 | Admitting: Sports Medicine

## 2022-05-15 ENCOUNTER — Other Ambulatory Visit: Payer: Self-pay

## 2022-05-15 VITALS — Ht 70.5 in | Wt 339.4 lb

## 2022-05-15 DIAGNOSIS — M1611 Unilateral primary osteoarthritis, right hip: Secondary | ICD-10-CM | POA: Diagnosis not present

## 2022-05-15 DIAGNOSIS — M25551 Pain in right hip: Secondary | ICD-10-CM | POA: Diagnosis not present

## 2022-05-15 MED ORDER — LIDOCAINE HCL 1 % IJ SOLN
4.0000 mL | INTRAMUSCULAR | Status: AC | PRN
Start: 2022-05-15 — End: 2022-05-15
  Administered 2022-05-15: 4 mL

## 2022-05-15 MED ORDER — NABUMETONE 750 MG PO TABS: 750.0000 mg | ORAL_TABLET | Freq: Two times a day (BID) | ORAL | 2 refills | Status: DC | PRN

## 2022-05-15 MED ORDER — METHYLPREDNISOLONE ACETATE 40 MG/ML IJ SUSP
80.0000 mg | INTRAMUSCULAR | Status: AC | PRN
Start: 2022-05-15 — End: 2022-05-15
  Administered 2022-05-15: 80 mg via INTRA_ARTICULAR

## 2022-05-15 NOTE — Progress Notes (Signed)
   Procedure Note  Patient: Sean [name]             Date of Birth: 01-29-1976           MRN: [id number]             Visit Date: 05/15/2022  Procedures: Visit Diagnoses:  1. Unilateral primary osteoarthritis, right hip    Large Joint Inj: R hip joint on 05/15/2022 9:15 AM Indications: pain Details: 22 G 3.5 in needle, ultrasound-guided anterior approach Medications: 4 mL lidocaine 1 %; 80 mg methylPREDNISolone acetate 40 MG/ML Outcome: tolerated well, no immediate complications  Procedure: US-guided intra-articular hip injection, right After discussion on risks/benefits/indications and informed verbal consent was obtained, a timeout was performed. Patient was lying supine on exam table. The hip was cleaned with betadine and alcohol swabs. Then utilizing ultrasound guidance, the patient's femoral head and neck junction was identified and subsequently injected with 4:2 lidocaine:depomedrol via an in-plane approach with ultrasound visualization of the injectate administered into the hip joint. Patient tolerated procedure well without immediate complications.  Procedure, treatment alternatives, risks and benefits explained, specific risks discussed. Consent was given by the patient. Immediately prior to procedure a time out was called to verify the correct patient, procedure, equipment, support staff and site/side marked as required. Patient was prepped and draped in the usual sterile fashion.   - I evaluated the patient about 10 minutes post-injection and he had good improvement in pain and range of motion - follow-up with Dr. Magnus Ivan as indicated; I am happy to see them as needed  Madelyn Brunner, DO Primary Care Sports Medicine Physician  [facility] - Orthopedics  This note was dictated using Dragon naturally speaking software and may contain errors in syntax, spelling, or content which have not been identified prior to signing this note.

## 2022-05-15 NOTE — Progress Notes (Signed)
The patient is well-known to me.  He is a 47 year old who unfortunately has debilitating arthritis involving his right hip.  We wanted to see him back today to see how he is doing on his weight loss journey.  He used to weigh over 400 pounds.  Today he is down to 339 pounds with a BMI of 48.  He totally understands that we still need to document more weight loss before we would be comfortable with scheduling hip replacement surgery due to the high complication rate and he states a definite understanding of this.  I will send in Relafen 750 mg to take twice daily with food as an anti-inflammatory to try.  He will stop Celebrex.  He will stop meloxicam as well.  He can also take Tylenol in between.  We will see him back in 8 weeks for repeat weight and BMI calculation.  We will see if my partner Dr. Shon Baton can place an ultrasound-guided steroid injection in his right hip soon.

## 2022-05-15 NOTE — Progress Notes (Signed)
Not a diabetic.

## 2022-05-28 ENCOUNTER — Other Ambulatory Visit: Payer: Self-pay | Admitting: Family Medicine

## 2022-05-28 DIAGNOSIS — I10 Essential (primary) hypertension: Secondary | ICD-10-CM

## 2022-05-30 ENCOUNTER — Encounter: Payer: Self-pay | Admitting: Family Medicine

## 2022-05-30 ENCOUNTER — Ambulatory Visit (INDEPENDENT_AMBULATORY_CARE_PROVIDER_SITE_OTHER): Payer: 59 | Admitting: Family Medicine

## 2022-05-30 DIAGNOSIS — E559 Vitamin D deficiency, unspecified: Secondary | ICD-10-CM

## 2022-05-30 DIAGNOSIS — E785 Hyperlipidemia, unspecified: Secondary | ICD-10-CM | POA: Diagnosis not present

## 2022-05-30 DIAGNOSIS — I10 Essential (primary) hypertension: Secondary | ICD-10-CM

## 2022-05-30 DIAGNOSIS — M25552 Pain in left hip: Secondary | ICD-10-CM

## 2022-05-30 DIAGNOSIS — R7303 Prediabetes: Secondary | ICD-10-CM

## 2022-05-30 DIAGNOSIS — M25551 Pain in right hip: Secondary | ICD-10-CM

## 2022-05-30 DIAGNOSIS — E1165 Type 2 diabetes mellitus with hyperglycemia: Secondary | ICD-10-CM

## 2022-05-30 MED ORDER — FENOFIBRATE 160 MG PO TABS: 160.0000 mg | ORAL_TABLET | Freq: Every day | ORAL | 3 refills | Status: DC

## 2022-05-30 MED ORDER — VITAMIN D (ERGOCALCIFEROL) 1.25 MG (50000 UNIT) PO CAPS: 50000.0000 [IU] | ORAL_CAPSULE | ORAL | 2 refills | Status: DC

## 2022-05-30 MED ORDER — ATORVASTATIN CALCIUM 40 MG PO TABS: 40.0000 mg | ORAL_TABLET | Freq: Every day | ORAL | 1 refills | Status: DC

## 2022-05-30 MED ORDER — METOPROLOL SUCCINATE ER 100 MG PO TB24
ORAL_TABLET | ORAL | 3 refills | Status: DC
Start: 2022-05-30 — End: 2022-06-29

## 2022-05-30 MED ORDER — CHLORTHALIDONE 25 MG PO TABS: 25.0000 mg | ORAL_TABLET | Freq: Every day | ORAL | 1 refills | Status: DC

## 2022-05-30 MED ORDER — TIRZEPATIDE 15 MG/0.5ML ~~LOC~~ SOAJ
15.0000 mg | SUBCUTANEOUS | 3 refills | Status: DC
Start: 2022-05-30 — End: 2023-03-06

## 2022-05-30 NOTE — Assessment & Plan Note (Signed)
Poorly controlled will alter medications, encouraged DASH diet, minimize caffeine and obtain adequate sleep. Report concerning symptoms and follow up as directed and as needed  Inc diovan 320 mg daily  F/u 3 months

## 2022-05-30 NOTE — Assessment & Plan Note (Signed)
Inc mounjaro to 15 mg daily  Con't diet and exercise

## 2022-05-30 NOTE — Assessment & Plan Note (Signed)
Tolerating statin, encouraged heart healthy diet, avoid trans fats, minimize simple carbs and saturated fats. Increase exercise as tolerated 

## 2022-05-30 NOTE — Assessment & Plan Note (Signed)
Working on losing weight so he can have a hip replacement

## 2022-05-30 NOTE — Patient Instructions (Addendum)
Obesity, Adult ?Obesity is the condition of having too much total body fat. Being overweight or obese means that your weight is greater than what is considered healthy for your body size. Obesity is determined by a measurement called BMI (body mass index). BMI is an estimate of body fat and is calculated from height and weight. For adults, a BMI of 30 or higher is considered obese. ?Obesity can lead to other health concerns and major illnesses, including: ?Stroke. ?Coronary artery disease (CAD). ?Type 2 diabetes. ?Some types of cancer, including cancers of the colon, breast, uterus, and gallbladder. ?High blood pressure (hypertension). ?High cholesterol. ?Gallbladder stones. ?Obesity can also contribute to: ?Osteoarthritis. ?Sleep apnea. ?Infertility problems. ?What are the causes? ?Common causes of this condition include: ?Eating daily meals that are high in calories, sugar, and fat. ?Drinking high amounts of sugar-sweetened beverages, such as soft drinks. ?Being born with genes that may make you more likely to become obese. ?Having a medical condition that causes obesity, including: ?Hypothyroidism. ?Polycystic ovarian syndrome (PCOS). ?Binge-eating disorder. ?Cushing syndrome. ?Taking certain medicines, such as steroids, antidepressants, and seizure medicines. ?Not being physically active (sedentary lifestyle). ?Not getting enough sleep. ?What increases the risk? ?The following factors may make you more likely to develop this condition: ?Having a family history of obesity. ?Living in an area with limited access to: ?Parks, recreation centers, or sidewalks. ?Healthy food choices, such as grocery stores and farmers' markets. ?What are the signs or symptoms? ?The main sign of this condition is having too much body fat. ?How is this diagnosed? ?This condition is diagnosed based on: ?Your BMI. If you are an adult with a BMI of 30 or higher, you are considered obese. ?Your waist circumference. This measures the  distance around your waistline. ?Your skinfold thickness. Your health care provider may gently pinch a fold of your skin and measure it. ?You may have other tests to check for underlying conditions. ?How is this treated? ?Treatment for this condition often includes changing your lifestyle. Treatment may include some or all of the following: ?Dietary changes. This may include developing a healthy meal plan. ?Regular physical activity. This may include activity that causes your heart to beat faster (aerobic exercise) and strength training. Work with your health care provider to design an exercise program that works for you. ?Medicine to help you lose weight if you are unable to lose one pound a week after six weeks of healthy eating and more physical activity. ?Treating conditions that cause the obesity (underlying conditions). ?Surgery. Surgical options may include gastric banding and gastric bypass. Surgery may be done if: ?Other treatments have not helped to improve your condition. ?You have a BMI of 40 or higher. ?You have life-threatening health problems related to obesity. ?Follow these instructions at home: ?Eating and drinking ? ?Follow recommendations from your health care provider about what you eat and drink. Your health care provider may advise you to: ?Limit fast food, sweets, and processed snack foods. ?Choose low-fat options, such as low-fat milk instead of whole milk. ?Eat five or more servings of fruits or vegetables every day. ?Choose healthy foods when you eat out. ?Keep low-fat snacks available. ?Limit sugary drinks, such as soda, fruit juice, sweetened iced tea, and flavored milk. ?Drink enough water to keep your urine pale yellow. ?Do not follow a fad diet. Fad diets can be unhealthy and even dangerous. ?Other healthful choices include: ?Eat at home more often. This gives you more control over what you eat. ?Learn to read food labels.   This will help you understand how much food is considered one  serving. ?Learn what a healthy serving size is. ?Physical activity ?Exercise regularly, as told by your health care provider. ?Most adults should get up to 150 minutes of moderate-intensity exercise every week. ?Ask your health care provider what types of exercise are safe for you and how often you should exercise. ?Warm up and stretch before being active. ?Cool down and stretch after being active. ?Rest between periods of activity. ?Lifestyle ?Work with your health care provider and a dietitian to set a weight-loss goal that is healthy and reasonable for you. ?Limit your screen time. ?Find ways to reward yourself that do not involve food. ?Do not drink alcohol if: ?Your health care provider tells you not to drink. ?You are pregnant, may be pregnant, or are planning to become pregnant. ?If you drink alcohol: ?Limit how much you have to: ?0-1 drink a day for women. ?0-2 drinks a day for men. ?Know how much alcohol is in your drink. In the [place] one drink equals one 12 oz bottle of beer (355 mL), one 5 oz glass of wine (148 mL), or one 1? oz glass of hard liquor (44 mL). ?General instructions ?Keep a weight-loss journal to keep track of the food you eat and how much exercise you get. ?Take over-the-counter and prescription medicines only as told by your health care provider. ?Take vitamins and supplements only as told by your health care provider. ?Consider joining a support group. Your health care provider may be able to recommend a support group. ?Pay attention to your mental health as obesity can lead to depression or self esteem issues. ?Keep all follow-up visits. This is important. ?Contact a health care provider if: ?You are unable to meet your weight-loss goal after six weeks of dietary and lifestyle changes. ?You have trouble breathing. ?Summary ?Obesity is the condition of having too much total body fat. ?Being overweight or obese means that your weight is greater than what is considered healthy for your body  size. ?Work with your health care provider and a dietitian to set a weight-loss goal that is healthy and reasonable for you. ?Exercise regularly, as told by your health care provider. Ask your health care provider what types of exercise are safe for you and how often you should exercise. ?This information is not intended to replace advice given to you by your health care provider. Make sure you discuss any questions you have with your health care provider. ?Document Revised: 08/31/2020 Document Reviewed: 08/31/2020 ?Elsevier Patient Education ? 2023 [employer] Inc[employer] ? ?

## 2022-05-30 NOTE — Progress Notes (Addendum)
Subjective:   By signing my name below, I, [name], attest that this documentation has been prepared under the direction and in the presence of Donato Schultz, DO. 05/30/2022   Patient ID: Sean [name], male    DOB: 06-19-1975, 47 y.o.   MRN: [id number]  Chief Complaint  Patient presents with   Annual Exam    Pt states fasting     HPI Patient is in today for a comprehensive physical exam.   He continues having hip pain. He is seeing a Careers adviser for his hip. His surgeon needs to see weight loss progress before agreeing to do surgery. He continues taking 12.5 mg mounjaro regularly. He finds his quality of life is gradually declining as his pain worsens. He recently found he has bone spurs on his hips.    Past Medical History:  Diagnosis Date   Heartburn    Hyperlipidemia    Hypertension    Sleep apnea    SOB (shortness of breath) on exertion    Swelling    feet and legs    History reviewed. No pertinent surgical history.  Family History  Problem Relation Age of Onset   Heart disease Father 35       MI   Hypertension Father    Sudden death Father        Heart disease   Hyperlipidemia Father    Obesity Father    Diabetes Mother        Borderline   Hyperlipidemia Mother    Hypertension Mother    Depression Mother    Obesity Mother    Breast cancer Maternal Grandmother     Social History   Socioeconomic History   Marital status: Married    Spouse name: Not on file   Number of children: Not on file   Years of education: Not on file   Highest education level: Not on file  Occupational History   Occupation: Dietitian  Tobacco Use   Smoking status: Never   Smokeless tobacco: Never  Substance and Sexual Activity   Alcohol use: Yes   Drug use: No   Sexual activity: Yes  Other Topics Concern   Not on file  Social History Narrative   Not on file   Social Determinants of Health   Financial Resource Strain: Not on file  Food  Insecurity: Not on file  Transportation Needs: Not on file  Physical Activity: Not on file  Stress: Not on file  Social Connections: Not on file  Intimate Partner Violence: Not on file    Outpatient Medications Prior to Visit  Medication Sig Dispense Refill   Ascorbic Acid (VITAMIN C) 1000 MG tablet Take 1,000 mg by mouth daily.     blood glucose meter kit and supplies KIT Dispense based on patient and insurance preference. Use up to four times daily as directed. (FOR ICD-9 250.00, 250.01). 1 each 0   cyanocobalamin 1000 MCG tablet Take 1,000 mcg by mouth daily.     metFORMIN (GLUCOPHAGE) 500 MG tablet Take 1 tablet (500 mg total) by mouth 2 (two) times daily with a meal. 180 tablet 1   Multiple Vitamin (MULTIVITAMIN) capsule Take 1 capsule by mouth daily.     nabumetone (RELAFEN) 750 MG tablet Take 1 tablet (750 mg total) by mouth 2 (two) times daily as needed. 60 tablet 2   NONFORMULARY OR COMPOUNDED ITEM Compression socks #1  As directed   Dx low ext edema 1 each 0  atorvastatin (LIPITOR) 40 MG tablet Take 1 tablet (40 mg total) by mouth daily. 90 tablet 1   chlorthalidone (HYGROTON) 25 MG tablet Take 1 tablet (25 mg total) by mouth daily. 90 tablet 1   DULoxetine (CYMBALTA) 20 MG capsule Take 2 capsules (40 mg total) by mouth daily. 180 capsule 0   fenofibrate 160 MG tablet Take 1 tablet (160 mg total) by mouth daily. 90 tablet 3   meloxicam (MOBIC) 15 MG tablet Take 1 tablet (15 mg total) by mouth daily. 30 tablet 0   metoprolol succinate (TOPROL-XL) 100 MG 24 hr tablet TAKE 1 & 1/2 (ONE & ONE-HALF) TABLETS BY MOUTH ONCE DAILY . APPOINTMENT REQUIRED FOR FUTURE REFILLS 45 tablet 0   MOUNJARO 12.5 MG/0.5ML Pen INJECT 12.5MG   ONCE A WEEK 4 mL 0   valsartan (DIOVAN) 160 MG tablet Take 1 tablet (160 mg total) by mouth daily. 90 tablet 2   Vitamin D, Ergocalciferol, (DRISDOL) 1.25 MG (50000 UNIT) CAPS capsule Take 1 capsule (50,000 Units total) by mouth once a week. 12 capsule 2   No  facility-administered medications prior to visit.    No Known Allergies  Review of Systems  Constitutional:  Negative for chills, fever and malaise/fatigue.  HENT:  Negative for congestion, hearing loss, sinus pain and sore throat.   Eyes:  Negative for discharge.  Respiratory:  Negative for cough, sputum production, shortness of breath and wheezing.   Cardiovascular:  Negative for chest pain, palpitations and leg swelling.  Gastrointestinal:  Negative for abdominal pain, blood in stool, constipation, diarrhea, heartburn, nausea and vomiting.  Genitourinary:  Negative for dysuria, frequency, hematuria and urgency.  Musculoskeletal:  Negative for back pain, falls and myalgias.       (-)new muscle pain (-)new joint pain (+)hip pain  Skin:  Negative for rash.       (-)New moles  Neurological:  Negative for dizziness, sensory change, loss of consciousness, weakness and headaches.  Endo/Heme/Allergies:  Negative for environmental allergies. Does not bruise/bleed easily.  Psychiatric/Behavioral:  Negative for depression and suicidal ideas. The patient is not nervous/anxious and does not have insomnia.        Objective:    Physical Exam Vitals and nursing note reviewed.  Constitutional:      General: He is not in acute distress.    Appearance: Normal appearance. He is not ill-appearing.  HENT:     Head: Normocephalic and atraumatic.     Right Ear: Tympanic membrane, ear canal and external ear normal.     Left Ear: Tympanic membrane, ear canal and external ear normal.  Eyes:     Extraocular Movements: Extraocular movements intact.     Pupils: Pupils are equal, round, and reactive to light.  Cardiovascular:     Rate and Rhythm: Normal rate and regular rhythm.     Heart sounds: Normal heart sounds. No murmur heard.    No gallop.  Pulmonary:     Effort: Pulmonary effort is normal. No respiratory distress.     Breath sounds: Normal breath sounds. No wheezing or rales.  Abdominal:      General: Bowel sounds are normal. There is no distension.     Palpations: Abdomen is soft.     Tenderness: There is no abdominal tenderness. There is no guarding.  Musculoskeletal:        General: Normal range of motion.  Skin:    General: Skin is warm and dry.  Neurological:     General: No focal deficit present.  Mental Status: He is alert and oriented to person, place, and time.  Psychiatric:        Mood and Affect: Mood normal.        Judgment: Judgment normal.     BP (!) 160/100 (BP Location: Left Arm, Patient Position: Sitting, Cuff Size: Large)   Pulse 80   Temp 98.4 F (36.9 C) (Oral)   Resp 18   Ht 5' 10.5" (1.791 m)   Wt (!) 342 lb 12.8 oz (155.5 kg)   SpO2 97%   BMI 48.49 kg/m  Wt Readings from Last 3 Encounters:  05/30/22 (!) 342 lb 12.8 oz (155.5 kg)  05/15/22 (!) 339 lb 6.4 oz (154 kg)  03/13/22 (!) 344 lb 9.6 oz (156.3 kg)       Assessment & Plan:  Morbid obesity Assessment & Plan: Inc mounjaro to 15 mg  Con't diet and exercise ----unable to exercise due to hip pain  Orders: -     Tirzepatide; Inject 15 mg into the skin once a week.  Dispense: 6 mL; Refill: 3 -     CBC with Differential/Platelet -     Comprehensive metabolic panel -     Lipid panel -     TSH -     VITAMIN D 25 Hydroxy (Vit-D Deficiency, Fractures) -     Ambulatory referral to General Surgery  Essential hypertension -     Chlorthalidone; Take 1 tablet (25 mg total) by mouth daily.  Dispense: 90 tablet; Refill: 1 -     Metoprolol Succinate ER; TAKE 1 & 1/2 (ONE & ONE-HALF) TABLETS BY MOUTH ONCE DAILY . APPOINTMENT REQUIRED FOR FUTURE REFILLS  Dispense: 45 tablet; Refill: 3  Hyperlipidemia, unspecified hyperlipidemia type Assessment & Plan: Tolerating statin, encouraged heart healthy diet, avoid trans fats, minimize simple carbs and saturated fats. Increase exercise as tolerated   Orders: -     Atorvastatin Calcium; Take 1 tablet (40 mg total) by mouth daily.  Dispense: 90  tablet; Refill: 1 -     Fenofibrate; Take 1 tablet (160 mg total) by mouth daily.  Dispense: 90 tablet; Refill: 3  Vitamin D deficiency -     Vitamin D (Ergocalciferol); Take 1 capsule (50,000 Units total) by mouth once a week.  Dispense: 12 capsule; Refill: 2  Bilateral hip pain Assessment & Plan: Working on losing weight so he can have a hip replacement    Type 2 diabetes mellitus with hyperglycemia, without long-term current use of insulin -     CBC with Differential/Platelet -     Comprehensive metabolic panel -     Lipid panel -     TSH -     Hemoglobin A1c -     VITAMIN D 25 Hydroxy (Vit-D Deficiency, Fractures)  Prediabetes  Primary hypertension Assessment & Plan: Poorly controlled will alter medications, encouraged DASH diet, minimize caffeine and obtain adequate sleep. Report concerning symptoms and follow up as directed and as needed  Inc diovan 320 mg daily  F/u 3 months    Severe obesity (BMI >= 40) Assessment & Plan: Inc mounjaro to 15 mg daily  Con't diet and exercise    Uncontrolled type 2 diabetes mellitus with hyperglycemia Assessment & Plan: Check labs  hgba1c to be checked, minimize simple carbs. Increase exercise as tolerated. Continue current meds      I, Donato Schultz, DO, personally preformed the services described in this documentation.  All medical record entries made by the scribe were at  my direction and in my presence.  I have reviewed the chart and discharge instructions (if applicable) and agree that the record reflects my personal performance and is accurate and complete. 05/30/2022   I,[name],acting as a scribe for Donato Schultz, DO.,have documented all relevant documentation on the behalf of Donato Schultz, DO,as directed by  Donato Schultz, DO while in the presence of Donato Schultz, DO.   Donato Schultz, DO

## 2022-05-30 NOTE — Assessment & Plan Note (Signed)
Check labs  hgba1c to be checked, minimize simple carbs. Increase exercise as tolerated. Continue current meds  

## 2022-05-30 NOTE — Assessment & Plan Note (Signed)
Inc mounjaro to 15 mg  Con't diet and exercise ----unable to exercise due to hip pain

## 2022-05-31 LAB — LIPID PANEL
Cholesterol: 144 mg/dL (ref 0–200)
HDL: 45.1 mg/dL (ref >39.00)
LDL Cholesterol: 86 mg/dL (ref 0–99)
NonHDL: 99.39
Total CHOL/HDL Ratio: 3
Triglycerides: 66 mg/dL (ref 0.0–149.0)
VLDL: 13.2 mg/dL (ref 0.0–40.0)

## 2022-05-31 LAB — CBC WITH DIFFERENTIAL/PLATELET
Basophils Absolute: 0.1 10*3/uL (ref 0.0–0.1)
Basophils Relative: 0.9 % (ref 0.0–3.0)
Eosinophils Absolute: 0.5 10*3/uL (ref 0.0–0.7)
Eosinophils Relative: 5.1 % — ABNORMAL HIGH (ref 0.0–5.0)
HCT: 43.8 % (ref 39.0–52.0)
Hemoglobin: 14.9 g/dL (ref 13.0–17.0)
Lymphocytes Relative: 26.1 % (ref 12.0–46.0)
Lymphs Abs: 2.7 10*3/uL (ref 0.7–4.0)
MCHC: 34 g/dL (ref 30.0–36.0)
MCV: 86.3 fl (ref 78.0–100.0)
Monocytes Absolute: 0.8 10*3/uL (ref 0.1–1.0)
Monocytes Relative: 7.5 % (ref 3.0–12.0)
Neutro Abs: 6.2 10*3/uL (ref 1.4–7.7)
Neutrophils Relative %: 60.4 % (ref 43.0–77.0)
Platelets: 319 10*3/uL (ref 150.0–400.0)
RBC: 5.08 Mil/uL (ref 4.22–5.81)
RDW: 13.6 % (ref 11.5–15.5)
WBC: 10.3 10*3/uL (ref 4.0–10.5)

## 2022-05-31 LAB — COMPREHENSIVE METABOLIC PANEL
ALT: 20 U/L (ref 0–53)
AST: 22 U/L (ref 0–37)
Albumin: 4.4 g/dL (ref 3.5–5.2)
Alkaline Phosphatase: 56 U/L (ref 39–117)
BUN: 15 mg/dL (ref 6–23)
CO2: 29 mEq/L (ref 19–32)
Calcium: 9.9 mg/dL (ref 8.4–10.5)
Chloride: 101 mEq/L (ref 96–112)
Creatinine, Ser: 0.95 mg/dL (ref 0.40–1.50)
GFR: 95.38 mL/min (ref >60.00)
Glucose, Bld: 73 mg/dL (ref 70–99)
Potassium: 4.6 mEq/L (ref 3.5–5.1)
Sodium: 138 mEq/L (ref 135–145)
Total Bilirubin: 0.7 mg/dL (ref 0.2–1.2)
Total Protein: 7 g/dL (ref 6.0–8.3)

## 2022-05-31 LAB — TSH: TSH: 2.92 u[IU]/mL (ref 0.35–5.50)

## 2022-05-31 LAB — VITAMIN D 25 HYDROXY (VIT D DEFICIENCY, FRACTURES): VITD: 54.65 ng/mL (ref 30.00–100.00)

## 2022-05-31 LAB — HEMOGLOBIN A1C: Hgb A1c MFr Bld: 5.4 % (ref 4.6–6.5)

## 2022-06-02 ENCOUNTER — Encounter: Payer: 59 | Admitting: Family Medicine

## 2022-06-08 ENCOUNTER — Other Ambulatory Visit: Payer: Self-pay | Admitting: Family Medicine

## 2022-06-08 ENCOUNTER — Encounter: Payer: Self-pay | Admitting: Family Medicine

## 2022-06-08 DIAGNOSIS — G4700 Insomnia, unspecified: Secondary | ICD-10-CM

## 2022-06-08 MED ORDER — TRAZODONE HCL 50 MG PO TABS
25.0000 mg | ORAL_TABLET | Freq: Every evening | ORAL | 3 refills | Status: DC | PRN
Start: 2022-06-08 — End: 2022-09-21

## 2022-06-08 MED ORDER — TRAZODONE HCL 50 MG PO TABS
25.0000 mg | ORAL_TABLET | Freq: Every evening | ORAL | 3 refills | Status: DC | PRN
Start: 2022-06-08 — End: 2022-06-08

## 2022-06-15 MED ORDER — GABAPENTIN 300 MG PO CAPS: ORAL_CAPSULE | ORAL | 2 refills | Status: DC

## 2022-06-29 ENCOUNTER — Other Ambulatory Visit: Payer: Self-pay | Admitting: Family Medicine

## 2022-06-29 DIAGNOSIS — I10 Essential (primary) hypertension: Secondary | ICD-10-CM

## 2022-07-07 ENCOUNTER — Encounter: Payer: Self-pay | Admitting: Family Medicine

## 2022-07-08 ENCOUNTER — Other Ambulatory Visit: Payer: Self-pay | Admitting: Family Medicine

## 2022-07-08 DIAGNOSIS — E785 Hyperlipidemia, unspecified: Secondary | ICD-10-CM

## 2022-07-10 ENCOUNTER — Encounter: Payer: Self-pay | Admitting: Family Medicine

## 2022-07-17 ENCOUNTER — Ambulatory Visit: Payer: 59 | Admitting: Orthopaedic Surgery

## 2022-07-17 ENCOUNTER — Encounter: Payer: Self-pay | Admitting: Orthopaedic Surgery

## 2022-07-17 VITALS — Ht 70.5 in | Wt 325.0 lb

## 2022-07-17 DIAGNOSIS — M1611 Unilateral primary osteoarthritis, right hip: Secondary | ICD-10-CM | POA: Diagnosis not present

## 2022-07-17 DIAGNOSIS — M25551 Pain in right hip: Secondary | ICD-10-CM

## 2022-07-17 NOTE — Progress Notes (Signed)
The patient comes in today for continued follow-up as it relates to his severe debilitating arthritis of his right hip.  He did have an intra-articular steroid injection under ultrasound by Dr. Shon Baton and that really did not help much at all.  His x-rays continue to show bone-on-bone wear of the right hip.  He walks with a Trendelenburg gait and his pain is daily.  He has been on a weight loss journey.  The last time I saw him his BMI was 48 and today is down to 45.  When I have him lay in a supine position he does not have a large thigh and his abdomen is easily mobilized.  I do feel comfortable at some point with proceeding with hip replacement surgery.  He is continuing on his weight loss journey.  I am comfortable with setting him up for a right anterior hip replacement in September of this year.  That we will give him a little bit more time to lose some weight.  We will see him back in August to go over the replacement surgery itself and to make sure he is doing well from a medical standpoint.  Will get a repeat weight and BMI then.  Of note his blood glucose is under excellent control.

## 2022-07-31 ENCOUNTER — Telehealth: Payer: Self-pay

## 2022-07-31 NOTE — Telephone Encounter (Signed)
Drug/Service Name: [facility] 15 MG/0.5 ML PEN Physician/Nurse: Loreen Freud CHASE EOC ID: [id number] Status: Approved Date Requested: 07/31/2022 09:07:27 Date Closed: 07/31/2022 16:18:53 Dispensing Location: [facility]   Est.Time of Completion: N/A

## 2022-07-31 NOTE — Telephone Encounter (Signed)
PA initiated via [email or web address]; Rx ID: [id number]. Awaiting determination.

## 2022-08-27 ENCOUNTER — Encounter: Payer: Self-pay | Admitting: Orthopaedic Surgery

## 2022-08-28 ENCOUNTER — Telehealth: Payer: Self-pay | Admitting: Orthopaedic Surgery

## 2022-08-28 NOTE — Telephone Encounter (Signed)
Patient called stating he was seen in ED at [facility] over the weekend and was told to follow up with [facility] ASAP. Pt states he also sent MyChart message yesterday and has not heard back from [facility]

## 2022-08-30 ENCOUNTER — Telehealth: Payer: Self-pay | Admitting: Orthopaedic Surgery

## 2022-08-30 NOTE — Telephone Encounter (Signed)
Pt wife called in stating they need pt Surgery moved up current surgery date is 10/27/22, pt spoke with Morrie Sheldon yesterday and stated it could possibly be moved to the middle of August

## 2022-08-30 NOTE — Telephone Encounter (Signed)
I called wife and rescheduled surgery to 10/06/22.

## 2022-09-18 ENCOUNTER — Encounter: Payer: Self-pay | Admitting: Orthopaedic Surgery

## 2022-09-18 ENCOUNTER — Ambulatory Visit: Payer: 59 | Admitting: Orthopaedic Surgery

## 2022-09-18 VITALS — Wt 318.0 lb

## 2022-09-18 DIAGNOSIS — M1611 Unilateral primary osteoarthritis, right hip: Secondary | ICD-10-CM | POA: Diagnosis not present

## 2022-09-18 DIAGNOSIS — M25551 Pain in right hip: Secondary | ICD-10-CM

## 2022-09-18 NOTE — Progress Notes (Signed)
The patient continues to follow-up with debilitating right hip pain and severe end-stage arthritis of his right hip.  His hip femoral head is almost in a subluxed position due to the severity of his arthritis.  He does ambit with a cane.  He has been on a weight loss journey.  His BMI used to be over 50.  Today it is down to 44.98.  His hemoglobin A1c is always been good below 6.  At this point his hip pain is debilitating.  We actually have him scheduled for a right total hip arthroplasty at the end of the month.  He has had steroid injections as well in that right hip.  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  We had a long and thorough discussion once again about hip replacement surgery.  We talked about what to expect from an intraoperative and postoperative standpoint.  We discussed the risks and benefits of the surgery and he understands that his risks are heightened given his weight but he has been on such a weight loss journey he will likely continue to lose weight after surgery.  All questions and concerns were answered and addressed.  Will we will see him the day of surgery and again give him a note to keep him out of work from today until further notice given his debilitating hip pain and his upcoming surgery.

## 2022-09-19 ENCOUNTER — Other Ambulatory Visit: Payer: Self-pay | Admitting: Orthopaedic Surgery

## 2022-09-19 ENCOUNTER — Other Ambulatory Visit: Payer: Self-pay

## 2022-09-21 ENCOUNTER — Telehealth: Payer: Self-pay | Admitting: Orthopaedic Surgery

## 2022-09-21 ENCOUNTER — Other Ambulatory Visit: Payer: Self-pay | Admitting: Family Medicine

## 2022-09-21 DIAGNOSIS — G4700 Insomnia, unspecified: Secondary | ICD-10-CM

## 2022-09-21 DIAGNOSIS — I10 Essential (primary) hypertension: Secondary | ICD-10-CM

## 2022-09-21 NOTE — Telephone Encounter (Signed)
[employer] forms received. To [employer].

## 2022-09-23 ENCOUNTER — Other Ambulatory Visit: Payer: Self-pay | Admitting: Family Medicine

## 2022-09-25 ENCOUNTER — Telehealth: Payer: Self-pay | Admitting: Orthopaedic Surgery

## 2022-09-25 NOTE — Patient Instructions (Addendum)
SURGICAL WAITING ROOM VISITATION  Patients having surgery or a procedure may have no more than 2 support people in the waiting area - these visitors may rotate.    Children under the age of 28 must have an adult with them who is not the patient.  Due to an increase in RSV and influenza rates and associated hospitalizations, children ages 68 and under may not visit patients in [facility] hospitals.  If the patient needs to stay at the hospital during part of their recovery, the visitor guidelines for inpatient rooms apply. Pre-op nurse will coordinate an appropriate time for 1 support person to accompany patient in pre-op.  This support person may not rotate.    Please refer to the [facility] website for the visitor guidelines for Inpatients (after your surgery is over and you are in a regular room).       Your procedure is scheduled on:  10/06/2022    Report to [facility] Main Entrance    Report to admitting at   [phone]   Call this number if you have problems the morning of surgery [phone]   Do not eat food :After Midnight.   After Midnight you may have the following liquids until _ 0645_____ AM  DAY OF SURGERY  Water Non-Citrus Juices (without pulp, NO RED-Apple, White grape, White cranberry) Black Coffee (NO MILK/CREAM OR CREAMERS, sugar ok)  Clear Tea (NO MILK/CREAM OR CREAMERS, sugar ok) regular and decaf                             Plain Jell-O (NO RED)                                           Fruit ices (not with fruit pulp, NO RED)                                     Popsicles (NO RED)                                                               Sports drinks like Gatorade (NO RED)                   The day of surgery:  Drink ONE (1) Pre-Surgery Clear Ensure or G2 at    [phone] ( have completed by )  the morning of surgery. Drink in one sitting. Do not sip.  This drink was given to you during your hospital  pre-op appointment visit. Nothing else to  drink after completing the  Pre-Surgery Clear Ensure or G2.          If you have questions, please contact your surgeon's office.       Oral Hygiene is also important to reduce your risk of infection.                                    Remember - BRUSH YOUR TEETH THE MORNING OF SURGERY  WITH YOUR REGULAR TOOTHPASTE  DENTURES WILL BE REMOVED PRIOR TO SURGERY PLEASE DO NOT APPLY "Poly grip" OR ADHESIVES!!!   Do NOT smoke after Midnight   Stop all vitamins and herbal supplements 7 days before surgery.   Take these medicines the morning of surgery with A SIP OF WATER:  Toprol    Mounjaro- last dose on 09/29/22   DO NOT TAKE ANY ORAL DIABETIC MEDICATIONS DAY OF YOUR SURGERY  Bring CPAP mask and tubing day of surgery.                              You may not have any metal on your body including hair pins, jewelry, and body piercing             Do not wear make-up, lotions, powders, perfumes/cologne, or deodorant  Do not wear nail polish including gel and S&S, artificial/acrylic nails, or any other type of covering on natural nails including finger and toenails. If you have artificial nails, gel coating, etc. that needs to be removed by a nail salon please have this removed prior to surgery or surgery may need to be canceled/ delayed if the surgeon/ anesthesia feels like they are unable to be safely monitored.   Do not shave  48 hours prior to surgery.               Men may shave face and neck.   Do not bring valuables to the hospital. [facility] IS NOT             RESPONSIBLE   FOR VALUABLES.   Contacts, glasses, dentures or bridgework may not be worn into surgery.   Bring small overnight bag day of surgery.   DO NOT BRING YOUR HOME MEDICATIONS TO THE HOSPITAL. PHARMACY WILL DISPENSE MEDICATIONS LISTED ON YOUR MEDICATION LIST TO YOU DURING YOUR ADMISSION IN THE HOSPITAL!    Patients discharged on the day of surgery will not be allowed to drive home.  Someone NEEDS to stay with  you for the first 24 hours after anesthesia.   Special Instructions: Bring a copy of your healthcare power of attorney and living will documents the day of surgery if you haven't scanned them before.              Please read over the following fact sheets you were given: IF YOU HAVE QUESTIONS ABOUT YOUR PRE-OP INSTRUCTIONS PLEASE CALL [phone]   If you received a COVID test during your pre-op visit  it is requested that you wear a mask when out in public, stay away from anyone that may not be feeling well and notify your surgeon if you develop symptoms. If you test positive for Covid or have been in contact with anyone that has tested positive in the last 10 days please notify you surgeon.      Pre-operative 5 CHG Bath Instructions      You can play a key role in reducing the risk of infection after surgery. Your skin needs to be as free of germs as possible. You can reduce the number of germs on your skin by washing with CHG (chlorhexidine gluconate) soap before surgery. CHG is an antiseptic soap that kills germs and continues to kill germs even after washing.   DO NOT use if you have an allergy to chlorhexidine/CHG or antibacterial soaps. If your skin becomes reddened or irritated, stop using the CHG and notify one of our  RNs at [phone].   Please shower with the CHG soap starting 4 days before surgery using the following schedule:     Please keep in mind the following:  DO NOT shave, including legs and underarms, starting the day of your first shower.   You may shave your face at any point before/day of surgery.  Place clean sheets on your bed the day you start using CHG soap. Use a clean washcloth (not used since being washed) for each shower. DO NOT sleep with pets once you start using the CHG.   CHG Shower Instructions:  If you choose to wash your hair and private area, wash first with your normal shampoo/soap.  After you use shampoo/soap, rinse your hair and body  thoroughly to remove shampoo/soap residue.  Turn the water OFF and apply about 3 tablespoons (45 ml) of CHG soap to a CLEAN washcloth.  Apply CHG soap ONLY FROM YOUR NECK DOWN TO YOUR TOES (washing for 3-5 minutes)  DO NOT use CHG soap on face, private areas, open wounds, or sores.  Pay special attention to the area where your surgery is being performed.  If you are having back surgery, having someone wash your back for you may be helpful. Wait 2 minutes after CHG soap is applied, then you may rinse off the CHG soap.  Pat dry with a clean towel  Put on clean clothes/pajamas   If you choose to wear lotion, please use ONLY the CHG-compatible lotions on the back of this paper.     Additional instructions for the day of surgery: DO NOT APPLY any lotions, deodorants, cologne, or perfumes.   Put on clean/comfortable clothes.  Brush your teeth.  Ask your nurse before applying any prescription medications to the skin.      CHG Compatible Lotions   Aveeno Moisturizing lotion  Cetaphil Moisturizing Cream  Cetaphil Moisturizing Lotion  Clairol Herbal Essence Moisturizing Lotion, Dry Skin  Clairol Herbal Essence Moisturizing Lotion, Extra Dry Skin  Clairol Herbal Essence Moisturizing Lotion, Normal Skin  Curel Age Defying Therapeutic Moisturizing Lotion with Alpha Hydroxy  Curel Extreme Care Body Lotion  Curel Soothing Hands Moisturizing Hand Lotion  Curel Therapeutic Moisturizing Cream, Fragrance-Free  Curel Therapeutic Moisturizing Lotion, Fragrance-Free  Curel Therapeutic Moisturizing Lotion, Original Formula  Eucerin Daily Replenishing Lotion  Eucerin Dry Skin Therapy Plus Alpha Hydroxy Crme  Eucerin Dry Skin Therapy Plus Alpha Hydroxy Lotion  Eucerin Original Crme  Eucerin Original Lotion  Eucerin Plus Crme Eucerin Plus Lotion  Eucerin TriLipid Replenishing Lotion  Keri Anti-Bacterial Hand Lotion  Keri Deep Conditioning Original Lotion Dry Skin Formula Softly Scented  Keri  Deep Conditioning Original Lotion, Fragrance Free Sensitive Skin Formula  Keri Lotion Fast Absorbing Fragrance Free Sensitive Skin Formula  Keri Lotion Fast Absorbing Softly Scented Dry Skin Formula  Keri Original Lotion  Keri Skin Renewal Lotion Keri Silky Smooth Lotion  Keri Silky Smooth Sensitive Skin Lotion  Nivea Body Creamy Conditioning Oil  Nivea Body Extra Enriched Teacher, adult education Moisturizing Lotion Nivea Crme  Nivea Skin Firming Lotion  NutraDerm 30 Skin Lotion  NutraDerm Skin Lotion  NutraDerm Therapeutic Skin Cream  NutraDerm Therapeutic Skin Lotion  ProShield Protective Hand Cream  Provon moisturizing lotion

## 2022-09-25 NOTE — Telephone Encounter (Signed)
[employer] forms received. To [employer].

## 2022-09-25 NOTE — Progress Notes (Signed)
Anesthesia Review:  PCP: Loreen Freud [facility] 05/30/22  Cardiologist : Chest x-ray : EKG : 8/20 /24 24-  Echo : Stress test: Cardiac Cath :  Activity level:  Sleep Study/ CPAP : Fasting Blood Sugar :      / Checks Blood Sugar -- times a day:   Blood Thinner/ Instructions /Last Dose: ASA / Instructions/ Last Dose :    Type 2 DM-  Hgba1c- 8/20/24Greggory Keen- weekly -

## 2022-09-26 ENCOUNTER — Other Ambulatory Visit: Payer: Self-pay

## 2022-09-26 ENCOUNTER — Encounter (HOSPITAL_COMMUNITY): Payer: Self-pay

## 2022-09-26 ENCOUNTER — Encounter: Payer: Self-pay | Admitting: Orthopaedic Surgery

## 2022-09-26 ENCOUNTER — Encounter (HOSPITAL_COMMUNITY)
Admission: RE | Admit: 2022-09-26 | Discharge: 2022-09-26 | Disposition: A | Discharge status: Home / Self Care | Payer: 59 | Source: Ambulatory Visit | Attending: Orthopaedic Surgery | Admitting: Orthopaedic Surgery

## 2022-09-26 VITALS — BP 133/89 | HR 65 | Temp 98.7°F | Ht 69.0 in | Wt 317.0 lb

## 2022-09-26 DIAGNOSIS — Z01812 Encounter for preprocedural laboratory examination: Secondary | ICD-10-CM | POA: Insufficient documentation

## 2022-09-26 DIAGNOSIS — Z01818 Encounter for other preprocedural examination: Secondary | ICD-10-CM

## 2022-09-26 DIAGNOSIS — Z01810 Encounter for preprocedural cardiovascular examination: Secondary | ICD-10-CM | POA: Insufficient documentation

## 2022-09-26 DIAGNOSIS — M1611 Unilateral primary osteoarthritis, right hip: Secondary | ICD-10-CM | POA: Diagnosis not present

## 2022-09-26 HISTORY — DX: Prediabetes: R73.03

## 2022-09-26 HISTORY — DX: Unspecified osteoarthritis, unspecified site: M19.90

## 2022-09-26 LAB — COMPREHENSIVE METABOLIC PANEL
ALT: 39 U/L (ref 0–44)
AST: 27 U/L (ref 15–41)
Albumin: 4.3 g/dL (ref 3.5–5.0)
Alkaline Phosphatase: 56 U/L (ref 38–126)
Anion gap: 8 (ref 5–15)
BUN: 19 mg/dL (ref 6–20)
CO2: 26 mmol/L (ref 22–32)
Calcium: 9.1 mg/dL (ref 8.9–10.3)
Chloride: 102 mmol/L (ref 98–111)
Creatinine, Ser: 0.73 mg/dL (ref 0.61–1.24)
GFR, Estimated: >60 mL/min (ref >60)
Glucose, Bld: 81 mg/dL (ref 70–99)
Potassium: 3.7 mmol/L (ref 3.5–5.1)
Sodium: 136 mmol/L (ref 135–145)
Total Bilirubin: 0.8 mg/dL (ref 0.3–1.2)
Total Protein: 7.3 g/dL (ref 6.5–8.1)

## 2022-09-26 LAB — CBC
HCT: 46.1 % (ref 39.0–52.0)
Hemoglobin: 15.5 g/dL (ref 13.0–17.0)
MCH: 28.9 pg (ref 26.0–34.0)
MCHC: 33.6 g/dL (ref 30.0–36.0)
MCV: 86 fL (ref 80.0–100.0)
Platelets: 293 10*3/uL (ref 150–400)
RBC: 5.36 MIL/uL (ref 4.22–5.81)
RDW: 12.1 % (ref 11.5–15.5)
WBC: 10.6 10*3/uL — ABNORMAL HIGH (ref 4.0–10.5)
nRBC: 0 % (ref 0.0–0.2)

## 2022-09-26 LAB — SURGICAL PCR SCREEN
MRSA, PCR: NEGATIVE
Staphylococcus aureus: NEGATIVE

## 2022-09-26 LAB — HEMOGLOBIN A1C
Hgb A1c MFr Bld: 5.3 % (ref 4.8–5.6)
Mean Plasma Glucose: 105.41 mg/dL

## 2022-09-26 NOTE — Progress Notes (Addendum)
For Short Stay: COVID SWAB appointment date:  Bowel Prep reminder:   For Anesthesia: PCP - Loreen Freud [facility] 05/30/22  Cardiologist - N/A  Chest x-ray -  EKG - 09/26/22 Stress Test -  ECHO -  Cardiac Cath -  Pacemaker/ICD device last checked: Pacemaker orders received: Device Rep notified:  Spinal Cord Stimulator:  Sleep Study - Yes CPAP - NO  Fasting Blood Sugar - N/A Checks Blood Sugar __0___ times a day Date and result of last Hgb A1c-5.3: 09/26/22  Last dose of GLP1 agonist- 09/26/22 GLP1 instructions: To hold it after 09/26/22.  Last dose of SGLT-2 inhibitors- N/A SGLT-2 instructions: To keep on hold.  Blood Thinner Instructions:N/A Aspirin Instructions: Last Dose:  Activity level: Can go up a flight of stairs and activities of daily living without stopping and without chest pain and/or shortness of breath   Able to exercise without chest pain and/or shortness of breath  Anesthesia review: Hx: HTN,Pre-DIA,OSA(NO CPAP).  Patient denies shortness of breath, fever, cough and chest pain at PAT appointment   Patient verbalized understanding of instructions that were given to them at the PAT appointment. Patient was also instructed that they will need to review over the PAT instructions again at home before surgery.

## 2022-10-05 ENCOUNTER — Other Ambulatory Visit: Payer: Self-pay

## 2022-10-05 DIAGNOSIS — Z96641 Presence of right artificial hip joint: Secondary | ICD-10-CM

## 2022-10-05 NOTE — H&P (Signed)
TOTAL HIP ADMISSION H&P  Patient is admitted for right total hip arthroplasty.  Subjective:  Chief Complaint: right hip pain  HPI: Sean [name], 47 y.o. male, has a history of pain and functional disability in the right hip(s) due to arthritis and patient has failed non-surgical conservative treatments for greater than 12 weeks to include NSAID's and/or analgesics, corticosteriod injections, use of assistive devices, weight reduction as appropriate, and activity modification.  Onset of symptoms was gradual starting 4 years ago with gradually worsening course since that time.The patient noted no past surgery on the right hip(s).  Patient currently rates pain in the right hip at 10 out of 10 with activity. Patient has night pain, worsening of pain with activity and weight bearing, trendelenberg gait, pain that interfers with activities of daily living, and pain with passive range of motion. Patient has evidence of subchondral cysts, subchondral sclerosis, periarticular osteophytes, and joint space narrowing by imaging studies. This condition presents safety issues increasing the risk of falls.  There is no current active infection.  Patient Active Problem List   Diagnosis Date Noted   Low back pain 02/24/2021   Arthritis of right hip 01/16/2021   Bronchitis due to COVID-19 virus 11/01/2020   Hip pain 11/01/2020   Right thigh pain 05/13/2019   Uncontrolled type 2 diabetes mellitus with hyperglycemia (HCC) 04/10/2019   Hyperlipidemia associated with type 2 diabetes mellitus (HCC) 04/10/2019   Cutaneous skin tags 04/10/2019   Class 3 severe obesity with serious comorbidity and body mass index (BMI) of 50.0 to 59.9 in adult [facility]) 03/18/2018   Cellulitis of left lower extremity 09/07/2017   History of cellulitis 08/19/2017   Hypokalemia 08/19/2017   Lower extremity edema 08/19/2017   Depression 02/15/2017   Prediabetes 09/19/2016   Vitamin D deficiency 09/19/2016   Other fatigue 09/05/2016    Dyspnea on exertion 09/05/2016   Snoring 09/05/2016   Morbid obesity (HCC) 09/05/2016   Patellofemoral arthritis of right knee 08/11/2016   Severe obesity (BMI >= 40) (HCC) 05/09/2015   Hyperlipidemia 02/19/2014   Sinusitis, acute maxillary 02/09/2014   Infected laceration 02/09/2014   HTN (hypertension) 03/13/2011   Past Medical History:  Diagnosis Date   Arthritis    Heartburn    Hyperlipidemia    Hypertension    Pre-diabetes    Sleep apnea    SOB (shortness of breath) on exertion    Swelling    feet and legs    Past Surgical History:  Procedure Laterality Date   NO PAST SURGERIES      No current facility-administered medications for this encounter.   Current Outpatient Medications  Medication Sig Dispense Refill Last Dose   Ascorbic Acid (VITAMIN C) 1000 MG tablet Take 1,000 mg by mouth daily.      atorvastatin (LIPITOR) 40 MG tablet Take 1 tablet (40 mg total) by mouth daily. 90 tablet 1    chlorthalidone (HYGROTON) 25 MG tablet Take 1 tablet (25 mg total) by mouth daily. 90 tablet 1    fenofibrate 160 MG tablet Take 1 tablet by mouth once daily 90 tablet 0    gabapentin (NEURONTIN) 300 MG capsule Take 1 to 2 tablets in the evening to help with hip pain. (Patient taking differently: Take 600 mg by mouth at bedtime.  to help with hip pain.) 60 capsule 2    ibuprofen (ADVIL) 200 MG tablet Take 800 mg by mouth every 8 (eight) hours as needed for moderate pain.      Misc  Natural Products (GLUCOSAMINE CHOND MSM FORMULA PO) Take 1 tablet by mouth daily. With Potassium      Multiple Vitamins-Minerals (MULTIVITAMIN WITH MINERALS) tablet Take 1 tablet by mouth daily.      nabumetone (RELAFEN) 750 MG tablet Take 1 tablet (750 mg total) by mouth 2 (two) times daily as needed. 60 tablet 2    Tart Cherry 1200 MG CAPS Take 1,200 mg by mouth daily.      tirzepatide (MOUNJARO) 15 MG/0.5ML Pen Inject 15 mg into the skin once a week. 6 mL 3    TURMERIC CURCUMIN PO Take 1 capsule by mouth  daily. 1500 mg      Vitamin D, Ergocalciferol, (DRISDOL) 1.25 MG (50000 UNIT) CAPS capsule Take 1 capsule (50,000 Units total) by mouth once a week. 12 capsule 2    blood glucose meter kit and supplies KIT Dispense based on patient and insurance preference. Use up to four times daily as directed. (FOR ICD-9 250.00, 250.01). 1 each 0    metoprolol succinate (TOPROL-XL) 100 MG 24 hr tablet TAKE 1 & 1/2 (ONE & ONE-HALF) TABLETS BY MOUTH ONCE DAILY 45 tablet 0    NONFORMULARY OR COMPOUNDED ITEM Compression socks #1  As directed   Dx low ext edema 1 each 0    traMADol (ULTRAM) 50 MG tablet Take 1 tablet (50 mg total) by mouth every 12 (twelve) hours as needed. 30 tablet 0    traZODone (DESYREL) 50 MG tablet TAKE 1/2 TO 1 (ONE-HALF TO ONE) TABLET BY MOUTH AT BEDTIME AS NEEDED FOR SLEEP 30 tablet 0    No Known Allergies  Social History   Tobacco Use   Smoking status: Never   Smokeless tobacco: Never  Substance Use Topics   Alcohol use: Yes    Comment: occas.    Family History  Problem Relation Age of Onset   Heart disease Father 47       MI   Hypertension Father    Sudden death Father        Heart disease   Hyperlipidemia Father    Obesity Father    Diabetes Mother        Borderline   Hyperlipidemia Mother    Hypertension Mother    Depression Mother    Obesity Mother    Breast cancer Maternal Grandmother      Review of Systems  Objective:  Physical Exam Vitals reviewed.  Constitutional:      Appearance: Normal appearance. He is obese.  HENT:     Head: Normocephalic and atraumatic.  Eyes:     Extraocular Movements: Extraocular movements intact.     Pupils: Pupils are equal, round, and reactive to light.  Cardiovascular:     Rate and Rhythm: Normal rate and regular rhythm.     Pulses: Normal pulses.  Pulmonary:     Effort: Pulmonary effort is normal.     Breath sounds: Normal breath sounds.  Abdominal:     Palpations: Abdomen is soft.  Musculoskeletal:     Cervical  back: Normal range of motion and neck supple.     Right hip: Tenderness and bony tenderness present. Decreased range of motion. Decreased strength.  Neurological:     Mental Status: He is alert and oriented to person, place, and time.  Psychiatric:        Behavior: Behavior normal.     Vital signs in last 24 hours:    Labs:   Estimated body mass index is 46.81 kg/m as calculated  from the following:   Height as of 09/26/22: 5\' 9"  (1.753 m).   Weight as of 09/26/22: 143.8 kg.   Imaging Review Plain radiographs demonstrate severe degenerative joint disease of the right hip(s). The bone quality appears to be excellent for age and reported activity level.      Assessment/Plan:  End stage arthritis, right hip(s)  The patient history, physical examination, clinical judgement of the provider and imaging studies are consistent with end stage degenerative joint disease of the right hip(s) and total hip arthroplasty is deemed medically necessary. The treatment options including medical management, injection therapy, arthroscopy and arthroplasty were discussed at length. The risks and benefits of total hip arthroplasty were presented and reviewed. The risks due to aseptic loosening, infection, stiffness, dislocation/subluxation,  thromboembolic complications and other imponderables were discussed.  The patient acknowledged the explanation, agreed to proceed with the plan and consent was signed. Patient is being admitted for inpatient treatment for surgery, pain control, PT, OT, prophylactic antibiotics, VTE prophylaxis, progressive ambulation and ADL's and discharge planning.The patient is planning to be discharged home with home health services

## 2022-10-06 ENCOUNTER — Observation Stay (HOSPITAL_COMMUNITY): Payer: 59

## 2022-10-06 ENCOUNTER — Ambulatory Visit (HOSPITAL_COMMUNITY): Payer: 59

## 2022-10-06 ENCOUNTER — Ambulatory Visit (HOSPITAL_COMMUNITY): Payer: 59 | Admitting: Anesthesiology

## 2022-10-06 ENCOUNTER — Other Ambulatory Visit: Payer: Self-pay

## 2022-10-06 ENCOUNTER — Encounter (HOSPITAL_COMMUNITY): Payer: Self-pay | Admitting: Orthopaedic Surgery

## 2022-10-06 ENCOUNTER — Observation Stay (HOSPITAL_COMMUNITY)
Admission: RE | Admit: 2022-10-06 | Discharge: 2022-10-07 | Disposition: A | Discharge status: Home / Self Care | Payer: 59 | Attending: Orthopaedic Surgery | Admitting: Orthopaedic Surgery

## 2022-10-06 ENCOUNTER — Ambulatory Visit (HOSPITAL_BASED_OUTPATIENT_CLINIC_OR_DEPARTMENT_OTHER): Payer: 59 | Admitting: Anesthesiology

## 2022-10-06 ENCOUNTER — Encounter (HOSPITAL_COMMUNITY)
Admission: RE | Disposition: A | Discharge status: Home / Self Care | Payer: Self-pay | Source: Home / Self Care | Attending: Orthopaedic Surgery

## 2022-10-06 DIAGNOSIS — Z96641 Presence of right artificial hip joint: Secondary | ICD-10-CM

## 2022-10-06 DIAGNOSIS — Z6841 Body Mass Index (BMI) 40.0 and over, adult: Secondary | ICD-10-CM | POA: Diagnosis not present

## 2022-10-06 DIAGNOSIS — M1611 Unilateral primary osteoarthritis, right hip: Secondary | ICD-10-CM

## 2022-10-06 DIAGNOSIS — Z8616 Personal history of COVID-19: Secondary | ICD-10-CM | POA: Insufficient documentation

## 2022-10-06 DIAGNOSIS — Z79899 Other long term (current) drug therapy: Secondary | ICD-10-CM | POA: Insufficient documentation

## 2022-10-06 DIAGNOSIS — I10 Essential (primary) hypertension: Secondary | ICD-10-CM | POA: Diagnosis not present

## 2022-10-06 DIAGNOSIS — E119 Type 2 diabetes mellitus without complications: Secondary | ICD-10-CM | POA: Diagnosis not present

## 2022-10-06 HISTORY — PX: TOTAL HIP ARTHROPLASTY: SHX124

## 2022-10-06 LAB — ABO/RH: ABO/RH(D): O POS

## 2022-10-06 LAB — TYPE AND SCREEN
ABO/RH(D): O POS
Antibody Screen: NEGATIVE

## 2022-10-06 SURGERY — ARTHROPLASTY, HIP, TOTAL, ANTERIOR APPROACH
Anesthesia: Monitor Anesthesia Care | Site: Hip | Laterality: Right

## 2022-10-06 MED ORDER — ACETAMINOPHEN 160 MG/5ML PO SOLN: 1000.0000 mg | Freq: Once | ORAL | Status: DC | PRN

## 2022-10-06 MED ORDER — ASPIRIN 81 MG PO CHEW
81.0000 mg | CHEWABLE_TABLET | Freq: Two times a day (BID) | ORAL | Status: DC
  Administered 2022-10-06 – 2022-10-07 (×2): 81 mg via ORAL
  Filled 2022-10-06 (×2): qty 1

## 2022-10-06 MED ORDER — METOPROLOL SUCCINATE ER 50 MG PO TB24
100.0000 mg | ORAL_TABLET | Freq: Every day | ORAL | Status: DC
  Administered 2022-10-07: 100 mg via ORAL
  Filled 2022-10-06: qty 2

## 2022-10-06 MED ORDER — ORAL CARE MOUTH RINSE: 15.0000 mL | Freq: Once | OROMUCOSAL | Status: AC

## 2022-10-06 MED ORDER — CEFAZOLIN SODIUM-DEXTROSE 2-4 GM/100ML-% IV SOLN
2.0000 g | Freq: Four times a day (QID) | INTRAVENOUS | Status: AC
  Administered 2022-10-06 (×2): 2 g via INTRAVENOUS
  Filled 2022-10-06 (×2): qty 100

## 2022-10-06 MED ORDER — OXYCODONE HCL 5 MG/5ML PO SOLN: 5.0000 mg | Freq: Once | ORAL | Status: DC | PRN

## 2022-10-06 MED ORDER — ONDANSETRON HCL 4 MG/2ML IJ SOLN
INTRAMUSCULAR | Status: AC
  Filled 2022-10-06: qty 2

## 2022-10-06 MED ORDER — PROPOFOL 10 MG/ML IV BOLUS
INTRAVENOUS | Status: DC | PRN
  Administered 2022-10-06: 20 mg via INTRAVENOUS

## 2022-10-06 MED ORDER — ATORVASTATIN CALCIUM 40 MG PO TABS
40.0000 mg | ORAL_TABLET | Freq: Every day | ORAL | Status: DC
  Administered 2022-10-06: 40 mg via ORAL
  Filled 2022-10-06: qty 1

## 2022-10-06 MED ORDER — OXYCODONE HCL 5 MG PO TABS
5.0000 mg | ORAL_TABLET | ORAL | Status: DC | PRN
  Administered 2022-10-07: 10 mg via ORAL
  Administered 2022-10-07: 5 mg via ORAL
  Filled 2022-10-06: qty 2

## 2022-10-06 MED ORDER — DIPHENHYDRAMINE HCL 12.5 MG/5ML PO ELIX: 12.5000 mg | ORAL_SOLUTION | ORAL | Status: DC | PRN

## 2022-10-06 MED ORDER — LACTATED RINGERS IV SOLN: INTRAVENOUS | Status: DC

## 2022-10-06 MED ORDER — BUPIVACAINE IN DEXTROSE 0.75-8.25 % IT SOLN
INTRATHECAL | Status: DC | PRN
  Administered 2022-10-06: 2 mL via INTRATHECAL

## 2022-10-06 MED ORDER — OXYCODONE HCL 5 MG PO TABS: 5.0000 mg | ORAL_TABLET | Freq: Once | ORAL | Status: DC | PRN

## 2022-10-06 MED ORDER — CEFAZOLIN IN SODIUM CHLORIDE 3-0.9 GM/100ML-% IV SOLN
3.0000 g | INTRAVENOUS | Status: AC
  Administered 2022-10-06: 3 g via INTRAVENOUS
  Filled 2022-10-06: qty 100

## 2022-10-06 MED ORDER — CHLORTHALIDONE 25 MG PO TABS
25.0000 mg | ORAL_TABLET | Freq: Every day | ORAL | Status: DC
  Administered 2022-10-07: 25 mg via ORAL
  Filled 2022-10-06: qty 1

## 2022-10-06 MED ORDER — ACETAMINOPHEN 325 MG PO TABS
325.0000 mg | ORAL_TABLET | Freq: Four times a day (QID) | ORAL | Status: DC | PRN
  Administered 2022-10-07: 650 mg via ORAL
  Filled 2022-10-06: qty 2

## 2022-10-06 MED ORDER — SODIUM CHLORIDE 0.9 % IR SOLN
Status: DC | PRN
  Administered 2022-10-06: 1000 mL

## 2022-10-06 MED ORDER — PROPOFOL 10 MG/ML IV BOLUS
INTRAVENOUS | Status: AC
  Filled 2022-10-06: qty 20

## 2022-10-06 MED ORDER — ONDANSETRON HCL 4 MG/2ML IJ SOLN: 4.0000 mg | Freq: Four times a day (QID) | INTRAMUSCULAR | Status: DC | PRN

## 2022-10-06 MED ORDER — METHOCARBAMOL 500 MG PO TABS
500.0000 mg | ORAL_TABLET | Freq: Four times a day (QID) | ORAL | Status: DC | PRN
  Administered 2022-10-06 – 2022-10-07 (×4): 500 mg via ORAL
  Filled 2022-10-06 (×4): qty 1

## 2022-10-06 MED ORDER — VITAMIN C 500 MG PO TABS
1000.0000 mg | ORAL_TABLET | Freq: Every day | ORAL | Status: DC
  Administered 2022-10-06 – 2022-10-07 (×2): 1000 mg via ORAL
  Filled 2022-10-06 (×2): qty 2

## 2022-10-06 MED ORDER — ORAL CARE MOUTH RINSE: 15.0000 mL | OROMUCOSAL | Status: DC | PRN

## 2022-10-06 MED ORDER — ONDANSETRON HCL 4 MG/2ML IJ SOLN
INTRAMUSCULAR | Status: DC | PRN
  Administered 2022-10-06: 4 mg via INTRAVENOUS

## 2022-10-06 MED ORDER — PHENOL 1.4 % MT LIQD: 1.0000 | OROMUCOSAL | Status: DC | PRN

## 2022-10-06 MED ORDER — POVIDONE-IODINE 10 % EX SWAB: 2.0000 | Freq: Once | CUTANEOUS | Status: DC

## 2022-10-06 MED ORDER — MENTHOL 3 MG MT LOZG: 1.0000 | LOZENGE | OROMUCOSAL | Status: DC | PRN

## 2022-10-06 MED ORDER — ACETAMINOPHEN 10 MG/ML IV SOLN: 1000.0000 mg | Freq: Once | INTRAVENOUS | Status: DC | PRN

## 2022-10-06 MED ORDER — METOCLOPRAMIDE HCL 5 MG PO TABS: 5.0000 mg | ORAL_TABLET | Freq: Three times a day (TID) | ORAL | Status: DC | PRN

## 2022-10-06 MED ORDER — HYDROMORPHONE HCL 1 MG/ML IJ SOLN: 0.5000 mg | INTRAMUSCULAR | Status: DC | PRN

## 2022-10-06 MED ORDER — ALUM & MAG HYDROXIDE-SIMETH 200-200-20 MG/5ML PO SUSP: 30.0000 mL | ORAL | Status: DC | PRN

## 2022-10-06 MED ORDER — GABAPENTIN 300 MG PO CAPS
600.0000 mg | ORAL_CAPSULE | Freq: Every day | ORAL | Status: DC
  Administered 2022-10-06: 600 mg via ORAL
  Filled 2022-10-06: qty 2

## 2022-10-06 MED ORDER — PROPOFOL 1000 MG/100ML IV EMUL
INTRAVENOUS | Status: AC
  Filled 2022-10-06: qty 100

## 2022-10-06 MED ORDER — STERILE WATER FOR IRRIGATION IR SOLN
Status: DC | PRN
Start: 2022-10-06 — End: 2022-10-06
  Administered 2022-10-06: 2000 mL

## 2022-10-06 MED ORDER — FENTANYL CITRATE (PF) 100 MCG/2ML IJ SOLN
INTRAMUSCULAR | Status: DC | PRN
  Administered 2022-10-06 (×3): 50 ug via INTRAVENOUS

## 2022-10-06 MED ORDER — FENOFIBRATE 160 MG PO TABS
160.0000 mg | ORAL_TABLET | Freq: Every day | ORAL | Status: DC
  Administered 2022-10-06 – 2022-10-07 (×2): 160 mg via ORAL
  Filled 2022-10-06 (×2): qty 1

## 2022-10-06 MED ORDER — METHOCARBAMOL 1000 MG/10ML IJ SOLN: 500.0000 mg | Freq: Four times a day (QID) | INTRAVENOUS | Status: DC | PRN

## 2022-10-06 MED ORDER — FENTANYL CITRATE PF 50 MCG/ML IJ SOSY: 25.0000 ug | PREFILLED_SYRINGE | INTRAMUSCULAR | Status: DC | PRN

## 2022-10-06 MED ORDER — SODIUM CHLORIDE 0.9 % IV SOLN: INTRAVENOUS | Status: DC

## 2022-10-06 MED ORDER — ONDANSETRON HCL 4 MG PO TABS: 4.0000 mg | ORAL_TABLET | Freq: Four times a day (QID) | ORAL | Status: DC | PRN

## 2022-10-06 MED ORDER — PROPOFOL 500 MG/50ML IV EMUL
INTRAVENOUS | Status: DC | PRN
  Administered 2022-10-06: 100 ug/kg/min via INTRAVENOUS

## 2022-10-06 MED ORDER — PHENYLEPHRINE HCL-NACL 20-0.9 MG/250ML-% IV SOLN
INTRAVENOUS | Status: DC | PRN
  Administered 2022-10-06: 25 ug/min via INTRAVENOUS

## 2022-10-06 MED ORDER — MIDAZOLAM HCL 5 MG/5ML IJ SOLN
INTRAMUSCULAR | Status: DC | PRN
  Administered 2022-10-06: 2 mg via INTRAVENOUS

## 2022-10-06 MED ORDER — MIDAZOLAM HCL 2 MG/2ML IJ SOLN
INTRAMUSCULAR | Status: AC
  Filled 2022-10-06: qty 2

## 2022-10-06 MED ORDER — TRANEXAMIC ACID-NACL 1000-0.7 MG/100ML-% IV SOLN
1000.0000 mg | INTRAVENOUS | Status: AC
  Administered 2022-10-06: 1000 mg via INTRAVENOUS
  Filled 2022-10-06: qty 100

## 2022-10-06 MED ORDER — DEXAMETHASONE SODIUM PHOSPHATE 10 MG/ML IJ SOLN
INTRAMUSCULAR | Status: DC | PRN
  Administered 2022-10-06: 10 mg via INTRAVENOUS

## 2022-10-06 MED ORDER — CHLORHEXIDINE GLUCONATE 0.12 % MT SOLN
15.0000 mL | Freq: Once | OROMUCOSAL | Status: AC
  Administered 2022-10-06: 15 mL via OROMUCOSAL

## 2022-10-06 MED ORDER — PANTOPRAZOLE SODIUM 40 MG PO TBEC
40.0000 mg | DELAYED_RELEASE_TABLET | Freq: Every day | ORAL | Status: DC
  Administered 2022-10-06 – 2022-10-07 (×2): 40 mg via ORAL
  Filled 2022-10-06 (×2): qty 1

## 2022-10-06 MED ORDER — ACETAMINOPHEN 500 MG PO TABS: 1000.0000 mg | ORAL_TABLET | Freq: Once | ORAL | Status: DC | PRN

## 2022-10-06 MED ORDER — OXYCODONE HCL 5 MG PO TABS
10.0000 mg | ORAL_TABLET | ORAL | Status: DC | PRN
  Administered 2022-10-06 (×2): 15 mg via ORAL
  Administered 2022-10-06: 10 mg via ORAL
  Administered 2022-10-07: 15 mg via ORAL
  Filled 2022-10-06: qty 3
  Filled 2022-10-06: qty 2
  Filled 2022-10-06: qty 3
  Filled 2022-10-06: qty 2
  Filled 2022-10-06: qty 3

## 2022-10-06 MED ORDER — DOCUSATE SODIUM 100 MG PO CAPS
100.0000 mg | ORAL_CAPSULE | Freq: Two times a day (BID) | ORAL | Status: DC
  Administered 2022-10-06 – 2022-10-07 (×2): 100 mg via ORAL
  Filled 2022-10-06 (×2): qty 1

## 2022-10-06 MED ORDER — METOCLOPRAMIDE HCL 5 MG/ML IJ SOLN: 5.0000 mg | Freq: Three times a day (TID) | INTRAMUSCULAR | Status: DC | PRN

## 2022-10-06 MED ORDER — 0.9 % SODIUM CHLORIDE (POUR BTL) OPTIME
TOPICAL | Status: DC | PRN
  Administered 2022-10-06: 1000 mL

## 2022-10-06 MED ORDER — FENTANYL CITRATE (PF) 100 MCG/2ML IJ SOLN
INTRAMUSCULAR | Status: AC
  Filled 2022-10-06: qty 2

## 2022-10-06 MED ORDER — DEXAMETHASONE SODIUM PHOSPHATE 10 MG/ML IJ SOLN
INTRAMUSCULAR | Status: AC
  Filled 2022-10-06: qty 1

## 2022-10-06 SURGICAL SUPPLY — 43 items
APL SKNCLS STERI-STRIP NONHPOA (GAUZE/BANDAGES/DRESSINGS)
BAG COUNTER SPONGE SURGICOUNT (BAG) ×1 IMPLANT
BAG SPEC THK2 15X12 ZIP CLS (MISCELLANEOUS)
BAG SPNG CNTER NS LX DISP (BAG) ×1
BAG ZIPLOCK 12X15 (MISCELLANEOUS) IMPLANT
BENZOIN TINCTURE PRP APPL 2/3 (GAUZE/BANDAGES/DRESSINGS) IMPLANT
BLADE SAW SGTL 18X1.27X75 (BLADE) ×1 IMPLANT
COVER PERINEAL POST (MISCELLANEOUS) ×1 IMPLANT
COVER SURGICAL LIGHT HANDLE (MISCELLANEOUS) ×1 IMPLANT
CUP ACET PNNCL SECTR W/GRIP 56 (Hips) IMPLANT
DRAPE FOOT SWITCH (DRAPES) ×1 IMPLANT
DRAPE STERI IOBAN 125X83 (DRAPES) ×1 IMPLANT
DRAPE U-SHAPE 47X51 STRL (DRAPES) ×2 IMPLANT
DRSG AQUACEL AG ADV 3.5X10 (GAUZE/BANDAGES/DRESSINGS) ×1 IMPLANT
DURAPREP 26ML APPLICATOR (WOUND CARE) ×1 IMPLANT
ELECT REM PT RETURN 15FT ADLT (MISCELLANEOUS) ×1 IMPLANT
GAUZE XEROFORM 1X8 LF (GAUZE/BANDAGES/DRESSINGS) IMPLANT
GLOVE BIO SURGEON STRL SZ7.5 (GLOVE) ×1 IMPLANT
GLOVE BIOGEL PI IND STRL 8 (GLOVE) ×2 IMPLANT
GLOVE ECLIPSE 8.0 STRL XLNG CF (GLOVE) ×1 IMPLANT
GOWN STRL REUS W/ TWL XL LVL3 (GOWN DISPOSABLE) ×2 IMPLANT
GOWN STRL REUS W/TWL XL LVL3 (GOWN DISPOSABLE) ×2
HANDPIECE INTERPULSE COAX TIP (DISPOSABLE) ×1
HEAD CERAMIC 36 PLUS5 (Hips) IMPLANT
HOLDER FOLEY CATH W/STRAP (MISCELLANEOUS) ×1 IMPLANT
KIT TURNOVER KIT A (KITS) IMPLANT
PACK ANTERIOR HIP CUSTOM (KITS) ×1 IMPLANT
PINN SECTOR W/GRIP ACE CUP 56 (Hips) ×1 IMPLANT
PINNACLE ALTRX PLUS 4 N 36X56 (Hips) IMPLANT
SCREW 6.5MMX25MM (Screw) IMPLANT
SET HNDPC FAN SPRY TIP SCT (DISPOSABLE) ×1 IMPLANT
STAPLER VISISTAT 35W (STAPLE) IMPLANT
STEM FEMORAL SZ6 HIGH ACTIS (Stem) IMPLANT
STRIP CLOSURE SKIN 1/2X4 (GAUZE/BANDAGES/DRESSINGS) IMPLANT
SUT ETHIBOND NAB CT1 #1 30IN (SUTURE) ×1 IMPLANT
SUT ETHILON 2 0 PS N (SUTURE) IMPLANT
SUT MNCRL AB 4-0 PS2 18 (SUTURE) IMPLANT
SUT VIC AB 0 CT1 36 (SUTURE) ×1 IMPLANT
SUT VIC AB 1 CT1 36 (SUTURE) ×1 IMPLANT
SUT VIC AB 2-0 CT1 27 (SUTURE) ×2
SUT VIC AB 2-0 CT1 TAPERPNT 27 (SUTURE) ×2 IMPLANT
TRAY FOLEY MTR SLVR 16FR STAT (SET/KITS/TRAYS/PACK) IMPLANT
YANKAUER SUCT BULB TIP NO VENT (SUCTIONS) ×1 IMPLANT

## 2022-10-06 NOTE — Anesthesia Preprocedure Evaluation (Addendum)
Anesthesia Evaluation  Patient identified by MRN, date of birth, ID band Patient awake    Reviewed: Allergy & Precautions, NPO status , Patient's Chart, lab work & pertinent test results  History of Anesthesia Complications Negative for: history of anesthetic complications  Airway Mallampati: III  TM Distance: >3 FB Neck ROM: Full    Dental  (+) Dental Advisory Given, Teeth Intact   Pulmonary neg shortness of breath, sleep apnea , neg COPD, neg recent URI   breath sounds clear to auscultation       Cardiovascular hypertension, Pt. on medications and Pt. on home beta blockers (-) angina (-) Past MI and (-) CHF  Rhythm:Regular     Neuro/Psych  PSYCHIATRIC DISORDERS  Depression    negative neurological ROS     GI/Hepatic negative GI ROS, Neg liver ROS,,,  Endo/Other  diabetes  Morbid obesityLab Results      Component                Value               Date                      HGBA1C                   5.3                 09/26/2022             Renal/GU Lab Results      Component                Value               Date                      NA                       136                 09/26/2022                K                        3.7                 09/26/2022                CO2                      26                  09/26/2022                GLUCOSE                  81                  09/26/2022                BUN                      19                  09/26/2022                CREATININE  0.73                09/26/2022                CALCIUM                  9.1                 09/26/2022                GFR                      95.38               05/30/2022                GFRNONAA                 >60                 09/26/2022                Musculoskeletal  (+) Arthritis ,    Abdominal   Peds  Hematology Lab Results      Component                Value               Date                       WBC                      10.6 (H)            09/26/2022                HGB                      15.5                09/26/2022                HCT                      46.1                09/26/2022                MCV                      86.0                09/26/2022                PLT                      293                 09/26/2022             Denies blood thinners   Anesthesia Other Findings   Reproductive/Obstetrics                             Anesthesia Physical Anesthesia Plan  ASA: 3  Anesthesia Plan: MAC and Spinal   Post-op Pain Management: Minimal or no pain anticipated   Induction: Intravenous  PONV Risk Score and Plan: 1 and  Ondansetron and Propofol infusion  Airway Management Planned: Nasal Cannula, Natural Airway and Simple Face Mask  Additional Equipment: None  Intra-op Plan:   Post-operative Plan:   Informed Consent: I have reviewed the patients History and Physical, chart, labs and discussed the procedure including the risks, benefits and alternatives for the proposed anesthesia with the patient or authorized representative who has indicated his/her understanding and acceptance.     Dental advisory given  Plan Discussed with: CRNA  Anesthesia Plan Comments:        Anesthesia Quick Evaluation

## 2022-10-06 NOTE — Anesthesia Procedure Notes (Signed)
Spinal  Patient location during procedure: OR End time: 10/06/2022 10:15 AM Reason for block: surgical anesthesia Staffing Performed: resident/CRNA  Resident/CRNA: Atilano Median, CRNA Performed by: Trevyon Swor, Clinical cytogeneticist D, CRNA Authorized by: Val Eagle, MD   Preanesthetic Checklist Completed: patient identified, IV checked, site marked, risks and benefits discussed, surgical consent, monitors and equipment checked, pre-op evaluation and timeout performed Spinal Block Patient position: sitting Prep: DuraPrep Patient monitoring: heart rate, continuous pulse ox and blood pressure Approach: midline Location: L3-4 Injection technique: single-shot Needle Needle type: Pencan and Quincke  Needle gauge: 22 G Needle length: 9 cm Assessment Sensory level: T6 Events: CSF return

## 2022-10-06 NOTE — Evaluation (Signed)
Physical Therapy Evaluation Patient Details Name: Sean [name] MRN: [id number] DOB: [birth year] Today's Date: 10/06/2022  History of Present Illness  47 yo male presents to therapy s/p R THA, anterior approach on 10/06/2022 due to failure of conservative measures. Pt PMH includes but is not limited to: LBP, DM II, LLE cellulitis, LE edema, depression, DOE, HTN, and OSA.  Clinical Impression      MEL MARCELENO is a 47 y.o. male POD 0 s/p R THA. Patient reports mod I with mobility at baseline. Patient is now limited by functional impairments (see PT problem list below) and requires CGA for bed mobility and CGA and cues for transfers. Patient was able to ambulate 45 feet with RW and CGA level of assist. Patient instructed in exercise to facilitate ROM and circulation to manage edema. Patient will benefit from continued skilled PT interventions to address impairments and progress towards PLOF. Acute PT will follow to progress mobility and stair training in preparation for safe discharge home with family support and OPPT services scheduled for 9/4.     If plan is discharge home, recommend the following: A little help with walking and/or transfers;A little help with bathing/dressing/bathroom;Assistance with cooking/housework;Assist for transportation;Help with stairs or ramp for entrance   Can travel by private vehicle        Equipment Recommendations None recommended by PT  Recommendations for Other Services       Functional Status Assessment Patient has had a recent decline in their functional status and demonstrates the ability to make significant improvements in function in a reasonable and predictable amount of time.     Precautions / Restrictions Precautions Precautions: Fall Restrictions Weight Bearing Restrictions: No      Mobility  Bed Mobility Overal bed mobility: Needs Assistance Bed Mobility: Supine to Sit     Supine to sit: Contact guard, HOB elevated, Used rails      General bed mobility comments: min cues    Transfers Overall transfer level: Needs assistance Equipment used: Rolling walker (2 wheels) Transfers: Sit to/from Stand Sit to Stand: Contact guard assist, From elevated surface           General transfer comment: min cues for proper UE placement    Ambulation/Gait Ambulation/Gait assistance: Contact guard assist Gait Distance (Feet): 45 Feet Assistive device: Rolling walker (2 wheels) Gait Pattern/deviations: Step-to pattern, Antalgic Gait velocity: decreased     General Gait Details: limited R hip and knee flexion with swing phase  Stairs            Wheelchair Mobility     Tilt Bed    Modified Rankin (Stroke Patients Only)       Balance Overall balance assessment: Needs assistance Sitting-balance support: Feet supported Sitting balance-Leahy Scale: Good     Standing balance support: Bilateral upper extremity supported, During functional activity, Reliant on assistive device for balance Standing balance-Leahy Scale: Poor Standing balance comment: static standing 1 UE support at RW                             Pertinent Vitals/Pain Pain Assessment Pain Assessment: 0-10 Pain Score: 4  Pain Location: R hip Pain Descriptors / Indicators: Aching, Constant, Discomfort, Operative site guarding Pain Intervention(s): Limited activity within patient's tolerance, Monitored during session, Premedicated before session, Repositioned, Ice applied    Home Living Family/patient expects to be discharged to:: Private residence Living Arrangements: Spouse/significant other;Children Available Help at Discharge: Family Type of  Home: House Home Access: Stairs to enter Entrance Stairs-Rails: None Entrance Stairs-Number of Steps: 1 Alternate Level Stairs-Number of Steps: 4 Home Layout: Multi-level;Bed/bath upstairs Home Equipment: Agricultural consultant (2 wheels);Cane - single point      Prior Function Prior Level  of Function : Independent/Modified Independent;Working/employed;Driving             Mobility Comments: mod I with intermittent use of SPC for all ADLs, self care tasks, IADLs       Extremity/Trunk Assessment        Lower Extremity Assessment Lower Extremity Assessment: RLE deficits/detail RLE Deficits / Details: ankle DF/PF 5/5 RLE Sensation: decreased light touch (pt indicates some residual abn sensation R LE)    Cervical / Trunk Assessment Cervical / Trunk Assessment:  (wfl)  Communication   Communication Communication: No apparent difficulties  Cognition Arousal: Alert Behavior During Therapy: WFL for tasks assessed/performed Overall Cognitive Status: Within Functional Limits for tasks assessed                                          General Comments      Exercises Total Joint Exercises Ankle Circles/Pumps: AROM, Both, 20 reps   Assessment/Plan    PT Assessment Patient needs continued PT services  PT Problem List Decreased strength;Decreased range of motion;Decreased activity tolerance;Decreased balance;Decreased mobility;Decreased coordination;Pain       PT Treatment Interventions DME instruction;Gait training;Stair training;Functional mobility training;Therapeutic activities;Therapeutic exercise;Balance training;Neuromuscular re-education;Patient/family education;Modalities    PT Goals (Current goals can be found in the Care Plan section)  Acute Rehab PT Goals Patient Stated Goal: to be able to walk (more  normal gait pattern) and work without pain as well as be intimate with wife PT Goal Formulation: With patient/family Time For Goal Achievement: 10/20/22 Potential to Achieve Goals: Good    Frequency 7X/week     Co-evaluation               AM-PAC PT "6 Clicks" Mobility  Outcome Measure Help needed turning from your back to your side while in a flat bed without using bedrails?: A Little Help needed moving from lying on your  back to sitting on the side of a flat bed without using bedrails?: A Little Help needed moving to and from a bed to a chair (including a wheelchair)?: A Little Help needed standing up from a chair using your arms (e.g., wheelchair or bedside chair)?: A Little Help needed to walk in hospital room?: A Little Help needed climbing 3-5 steps with a railing? : A Lot 6 Click Score: 17    End of Session Equipment Utilized During Treatment: Gait belt Activity Tolerance: Patient tolerated treatment well Patient left: in chair;with call bell/phone within reach;with family/visitor present Nurse Communication: Mobility status PT Visit Diagnosis: Unsteadiness on feet (R26.81);Other abnormalities of gait and mobility (R26.89);Muscle weakness (generalized) (M62.81);Difficulty in walking, not elsewhere classified (R26.2);Pain Pain - Right/Left: Right Pain - part of body: Leg;Hip    Time: 1700-1729 PT Time Calculation (min) (ACUTE ONLY): 29 min   Charges:   PT Evaluation $PT Eval Low Complexity: 1 Low PT Treatments $Gait Training: 8-22 mins PT General Charges $$ ACUTE PT VISIT: 1 Visit         Johnny Bridge, PT Acute Rehab   Jacqualyn Posey 10/06/2022, 5:39 PM

## 2022-10-06 NOTE — Op Note (Signed)
Operative Note  Date of operation: 10/06/2022 Preoperative diagnosis: Right hip primary osteoarthritis Postoperative diagnosis: Same  Procedure: Right direct anterior total hip arthroplasty  Implants: Implant Name Type Inv. Item Serial No. Manufacturer Lot No. LRB No. Used Action  PINN SECTOR W/GRIP ACE CUP 56 - [id number] Hips PINN SECTOR W/GRIP ACE CUP 56  [employer] [id number] Right 1 Implanted  PINNACLE ALTRX PLUS 4 N 36X56 - [id number] Hips PINNACLE ALTRX PLUS 4 N 36X56  [employer] [id number] Right 1 Implanted  SCREW 6.5MMX25MM - [id number] Screw SCREW 6.5MMX25MM  [employer] [id number] Right 1 Implanted  HEAD CERAMIC 36 PLUS5 - [id number] Hips HEAD CERAMIC 36 PLUS5  [employer] [id number] Right 1 Implanted  STEM FEMORAL SZ6 HIGH ACTIS - [id number] Stem STEM FEMORAL SZ6 HIGH ACTIS  [employer] [id number] Right 1 Implanted   Surgeon: Vanita Panda. Magnus Ivan, MD Assistant: Rexene Edison, PA-C  Anesthesia: Spinal Antibiotics: 3 g IV Ancef EBL: 300 cc Complications: None  Indications: The patient is a 47 year old gentleman with debilitating arthritis involving his right hip that has gotten worse over the last 12 months.  His x-rays show severe end-stage arthritis of his right hip with bone-on-bone wear.  He has tried and failed all forms of conservative treatment.  His BMI has been incredibly high over 50 and he has gotten down to 46.  He is aware that this surgery can be incredibly difficult in someone with his body habitus.  He has a heightened risk of all the complications happening including the risk of acute blood loss anemia, nerve or vessel injury, fracture, infection, DVT, implant failure, leg length differences and especially wound healing issues.  He understands her goals are hopefully decrease pain, improve mobility and improved quality of life and hopefully he can stay on his weight loss journey.  Procedure description: After informed consent was obtained  and the appropriate right hip was marked, the patient was brought to the operating room and sat up on the stretcher where spinal anesthesia was obtained he was then laid in supine position on stretcher and traction boots were placed on both his feet and a Foley catheter was placed.  He was next placed supine on the Hana fracture table with a perineal post in place in both legs and inline skeletal traction devices but no traction applied.  His right operative hip and pelvis were assessed radiographically.  The right hip was then prepped and draped in DuraPrep and sterile drapes.  A timeout was called and he was identified as the correct patient and the correct right hip.  An incision was then made just inferior and posterior to the ASIS and carried slightly obliquely down the leg.  Dissection was carried down to the tensor fascia lata muscle and the tensor fascia was divided longitudinally to proceed with a direct interposed the hip.  Circumflex vessels were identified and cauterized.  The hip capsule was identified and opened up in L-type format finding a very large joint effusion consistent with his severe arthritis.  Cobra retractors were placed around the medial and lateral femoral neck and a femoral neck cut was made with an oscillating saw just proximal to the lesser trochanter and this was completed with an osteotome.  A corkscrew guide was placed in the femoral head and the femoral head was removed in its entirety and it was significantly deformed with complete loss of cartilage.  A bent Hohmann was then placed over the medial acetabular rim and remnants of the acetabular labrum and other  debris removed.  Reaming was then initiated from a size 43 reamer and stepwise increments going up to a size 55 reamer with all reamers placed under direct visualization and the last reamer placed under direct fluoroscopy in order to obtain the depth of reaming, the inclination and the anteversion.  The real DePuy sector  GRIPTION acetabular pendant size 56 was then placed without difficulty followed by single screw and a 36+4 polyethylene liner.  Attention was then turned to the femur.  With the right leg externally rotated to 120 degrees, extended and adducted, a Mueller retractor was placed medially and a Hohmann retractor was placed by the greater trochanter.  The lateral joint capsule was released and a box cutting osteotome was used in her femoral canal.  Broaching was then initiated using the Actis broaching system from a size 0 going up to a size 6.  With a size 6 in place we trialed a high offset femoral neck and a 36+1.5 trial hip ball.  The leg was brought over and up and with traction and internal rotation reduced in the pelvis.  Based on radiographic and clinical assessment we needed to be just a little bit longer.  We dislocated the hip and remove the trial components.  We then placed the real Actis femoral component with high offset size 6 and the real 36+5 ceramic head ball.  Again this was reduced into the acetabulum and we are pleased with leg length, offset, range of motion and stability.  We then irrigated the soft tissue with normal saline solution using pulsatile lavage.  There was minimal joint capsule to closed with interrupted #1 Ethibond suture followed by #1 Vicryl to close the tensor fascia.  0 Vicryl was used to close the deep tissue and 2-0 Vicryl was used to close subcutaneous tissue.  The skin was closed with staples.  An Aquacel dressing was applied.  The patient was taken off of the Hana table and taken the recovery room.  Rexene Edison, PA-C did assist during the entire case and beginning to end and his assistance was medically necessary and crucial for soft tissue management and retraction, helping guide implant placement and a layered closure of the wound.

## 2022-10-06 NOTE — Transfer of Care (Signed)
Immediate Anesthesia Transfer of Care Note  Patient: ARTEEN MORAITIS  Procedure(s) Performed: RIGHT TOTAL HIP ARTHROPLASTY ANTERIOR APPROACH (Right: Hip)  Patient Location: PACU  Anesthesia Type:Spinal  Level of Consciousness: awake, alert , and oriented  Airway & Oxygen Therapy: Patient Spontanous Breathing and Patient connected to face mask oxygen  Post-op Assessment: Report given to RN and Post -op Vital signs reviewed and stable  Post vital signs: Reviewed and stable  Last Vitals:  Vitals Value Taken Time  BP    Temp    Pulse 59 10/06/22 1151  Resp 16 10/06/22 1151  SpO2 100 % 10/06/22 1151  Vitals shown include unfiled device data.  Last Pain:  Vitals:   10/06/22 0738  TempSrc:   PainSc: 0-No pain         Complications: No notable events documented.

## 2022-10-06 NOTE — Anesthesia Postprocedure Evaluation (Signed)
Anesthesia Post Note  Patient: Sean [name]  Procedure(s) Performed: RIGHT TOTAL HIP ARTHROPLASTY ANTERIOR APPROACH (Right: Hip)     Patient location during evaluation: PACU Anesthesia Type: MAC and Spinal Level of consciousness: awake and alert Pain management: pain level controlled Vital Signs Assessment: post-procedure vital signs reviewed and stable Respiratory status: spontaneous breathing, nonlabored ventilation and respiratory function stable Cardiovascular status: stable and blood pressure returned to baseline Postop Assessment: no apparent nausea or vomiting Anesthetic complications: no   No notable events documented.  Last Vitals:  Vitals:   10/06/22 1300 10/06/22 1336  BP: (!) 97/57 100/63  Pulse: (!) 56 (!) 56  Resp: 16 15  Temp:  36.6 C  SpO2: 96% 98%    Last Pain:  Vitals:   10/06/22 1336  TempSrc: Oral  PainSc:                  Sarayah Bacchi

## 2022-10-06 NOTE — Interval H&P Note (Signed)
History and Physical Interval Note: The patient comes in today for a right total hip replacement to treat his severe right hip arthritis.  There has been no acute or interval change in his medical status.  The risks and benefits of surgery been discussed in detail and informed consent has been obtained.  He understands that surgery will be difficult given his high BMI but his hip arthritis is so severe this is the neck step to hopefully help decrease his pain and improve his quality of life.  10/06/2022 8:43 AM  Aurea Graff  has presented today for surgery, with the diagnosis of OSTEOARTHRITIS RIGHT HIP.  The various methods of treatment have been discussed with the patient and family. After consideration of risks, benefits and other options for treatment, the patient has consented to  Procedure(s): RIGHT TOTAL HIP ARTHROPLASTY ANTERIOR APPROACH (Right) as a surgical intervention.  The patient's history has been reviewed, patient examined, no change in status, stable for surgery.  I have reviewed the patient's chart and labs.  Questions were answered to the patient's satisfaction.     Kathryne Hitch

## 2022-10-07 ENCOUNTER — Other Ambulatory Visit: Payer: Self-pay | Admitting: Family Medicine

## 2022-10-07 DIAGNOSIS — M1611 Unilateral primary osteoarthritis, right hip: Secondary | ICD-10-CM | POA: Diagnosis not present

## 2022-10-07 DIAGNOSIS — E785 Hyperlipidemia, unspecified: Secondary | ICD-10-CM

## 2022-10-07 LAB — CBC
HCT: 36.8 % — ABNORMAL LOW (ref 39.0–52.0)
Hemoglobin: 12.5 g/dL — ABNORMAL LOW (ref 13.0–17.0)
MCH: 29.1 pg (ref 26.0–34.0)
MCHC: 34 g/dL (ref 30.0–36.0)
MCV: 85.6 fL (ref 80.0–100.0)
Platelets: 248 10*3/uL (ref 150–400)
RBC: 4.3 MIL/uL (ref 4.22–5.81)
RDW: 12.1 % (ref 11.5–15.5)
WBC: 15.1 10*3/uL — ABNORMAL HIGH (ref 4.0–10.5)
nRBC: 0 % (ref 0.0–0.2)

## 2022-10-07 LAB — BASIC METABOLIC PANEL
Anion gap: 11 (ref 5–15)
BUN: 15 mg/dL (ref 6–20)
CO2: 25 mmol/L (ref 22–32)
Calcium: 8.9 mg/dL (ref 8.9–10.3)
Chloride: 101 mmol/L (ref 98–111)
Creatinine, Ser: 0.92 mg/dL (ref 0.61–1.24)
GFR, Estimated: >60 mL/min (ref >60)
Glucose, Bld: 141 mg/dL — ABNORMAL HIGH (ref 70–99)
Potassium: 3.3 mmol/L — ABNORMAL LOW (ref 3.5–5.1)
Sodium: 137 mmol/L (ref 135–145)

## 2022-10-07 MED ORDER — OXYCODONE HCL 5 MG PO TABS: 5.0000 mg | ORAL_TABLET | Freq: Four times a day (QID) | ORAL | 0 refills | Status: DC | PRN

## 2022-10-07 MED ORDER — ASPIRIN 81 MG PO CHEW: 81.0000 mg | CHEWABLE_TABLET | Freq: Two times a day (BID) | ORAL | 0 refills | Status: DC

## 2022-10-07 MED ORDER — TIZANIDINE HCL 4 MG PO TABS: 4.0000 mg | ORAL_TABLET | Freq: Four times a day (QID) | ORAL | 0 refills | Status: DC | PRN

## 2022-10-07 NOTE — Plan of Care (Signed)
  Problem: Education: Goal: Knowledge of the prescribed therapeutic regimen will improve Outcome: Adequate for Discharge Goal: Understanding of discharge needs will improve Outcome: Adequate for Discharge   Problem: Activity: Goal: Ability to avoid complications of mobility impairment will improve Outcome: Adequate for Discharge Goal: Ability to tolerate increased activity will improve Outcome: Adequate for Discharge   Problem: Clinical Measurements: Goal: Postoperative complications will be avoided or minimized Outcome: Adequate for Discharge   Problem: Pain Management: Goal: Pain level will decrease with appropriate interventions Outcome: Adequate for Discharge   Problem: Skin Integrity: Goal: Will show signs of wound healing Outcome: Adequate for Discharge   Problem: Education: Goal: Knowledge of General Education information will improve Description: Including pain rating scale, medication(s)/side effects and non-pharmacologic comfort measures Outcome: Adequate for Discharge   Problem: Health Behavior/Discharge Planning: Goal: Ability to manage health-related needs will improve Outcome: Adequate for Discharge   Problem: Clinical Measurements: Goal: Ability to maintain clinical measurements within normal limits will improve Outcome: Adequate for Discharge Goal: Will remain free from infection Outcome: Adequate for Discharge Goal: Diagnostic test results will improve Outcome: Adequate for Discharge Goal: Respiratory complications will improve Outcome: Adequate for Discharge Goal: Cardiovascular complication will be avoided Outcome: Adequate for Discharge   Problem: Activity: Goal: Risk for activity intolerance will decrease Outcome: Adequate for Discharge   Problem: Nutrition: Goal: Adequate nutrition will be maintained Outcome: Adequate for Discharge   Problem: Coping: Goal: Level of anxiety will decrease Outcome: Adequate for Discharge   Problem:  Elimination: Goal: Will not experience complications related to bowel motility Outcome: Adequate for Discharge Goal: Will not experience complications related to urinary retention Outcome: Adequate for Discharge   Problem: Pain Managment: Goal: General experience of comfort will improve Outcome: Adequate for Discharge   Problem: Safety: Goal: Ability to remain free from injury will improve Outcome: Adequate for Discharge   Problem: Skin Integrity: Goal: Risk for impaired skin integrity will decrease Outcome: Adequate for Discharge

## 2022-10-07 NOTE — Progress Notes (Signed)
AVS given to Pt and wife.  All questions answered. All belongings taken with pt.  Pt stable at time of discharge.

## 2022-10-07 NOTE — Plan of Care (Signed)
  Problem: Education: Goal: Knowledge of the prescribed therapeutic regimen will improve Outcome: Progressing Goal: Understanding of discharge needs will improve Outcome: Progressing   Problem: Activity: Goal: Ability to avoid complications of mobility impairment will improve Outcome: Progressing Goal: Ability to tolerate increased activity will improve Outcome: Progressing   Problem: Clinical Measurements: Goal: Postoperative complications will be avoided or minimized Outcome: Progressing   Problem: Pain Management: Goal: Pain level will decrease with appropriate interventions Outcome: Progressing   Problem: Skin Integrity: Goal: Will show signs of wound healing Outcome: Progressing   Problem: Education: Goal: Knowledge of General Education information will improve Description: Including pain rating scale, medication(s)/side effects and non-pharmacologic comfort measures Outcome: Progressing   Problem: Health Behavior/Discharge Planning: Goal: Ability to manage health-related needs will improve Outcome: Progressing   Problem: Clinical Measurements: Goal: Ability to maintain clinical measurements within normal limits will improve Outcome: Progressing Goal: Will remain free from infection Outcome: Progressing Goal: Diagnostic test results will improve Outcome: Progressing Goal: Respiratory complications will improve Outcome: Progressing Goal: Cardiovascular complication will be avoided Outcome: Progressing   Problem: Activity: Goal: Risk for activity intolerance will decrease Outcome: Progressing   Problem: Nutrition: Goal: Adequate nutrition will be maintained Outcome: Progressing   Problem: Coping: Goal: Level of anxiety will decrease Outcome: Progressing   Problem: Elimination: Goal: Will not experience complications related to bowel motility Outcome: Progressing Goal: Will not experience complications related to urinary retention Outcome: Progressing    Problem: Pain Managment: Goal: General experience of comfort will improve Outcome: Progressing   Problem: Safety: Goal: Ability to remain free from injury will improve Outcome: Progressing   Problem: Skin Integrity: Goal: Risk for impaired skin integrity will decrease Outcome: Progressing

## 2022-10-07 NOTE — Progress Notes (Signed)
Physical Therapy Treatment Patient Details Name: Sean [name] MRN: [id number] DOB: 10-Jul-1975 Today's Date: 10/07/2022   History of Present Illness 47 yo male presents to therapy s/p R THA, anterior approach on 10/06/2022 due to failure of conservative measures. Pt PMH includes but is not limited to: LBP, DM II, LLE cellulitis, LE edema, depression, DOE, HTN, and OSA.    PT Comments  Pt progressing very well, meeting goals and  feels ready to d/c home today with family assist as needed. PT in agreement with plan; pt would like to get back to pool exercise--educated on importance of incision being fully healed and being cleared by MD.    If plan is discharge home, recommend the following: A little help with walking and/or transfers;A little help with bathing/dressing/bathroom;Assistance with cooking/housework;Assist for transportation;Help with stairs or ramp for entrance   Can travel by private vehicle        Equipment Recommendations  None recommended by PT    Recommendations for Other Services       Precautions / Restrictions Precautions Precautions: Fall Restrictions Weight Bearing Restrictions: No     Mobility  Bed Mobility Overal bed mobility: Needs Assistance Bed Mobility: Supine to Sit     Supine to sit: Contact guard, HOB elevated, Min assist     General bed mobility comments: min cues    Transfers Overall transfer level: Needs assistance Equipment used: Rolling walker (2 wheels) Transfers: Sit to/from Stand Sit to Stand: Contact guard assist, From elevated surface, Supervision           General transfer comment: verbal cues for proper UE placement and to control descent    Ambulation/Gait Ambulation/Gait assistance: Supervision Gait Distance (Feet): 100 Feet Assistive device: Rolling walker (2 wheels) Gait Pattern/deviations: Step-to pattern, Step-through pattern, Decreased stance time - right Gait velocity: decreased     General Gait Details:  cues for sequence, incr wt shift to RLE, progression to step through gait   Stairs Stairs: Yes Stairs assistance: Contact guard assist Stair Management: Two rails, Step to pattern, Forwards (pt pushes on wall and holds rail at home) Number of Stairs: 3 General stair comments: cues for sequence; CGA for safety; wife present and supportive, able to return demo   Wheelchair Mobility     Tilt Bed    Modified Rankin (Stroke Patients Only)       Balance Overall balance assessment: Needs assistance Sitting-balance support: Feet supported Sitting balance-Leahy Scale: Good     Standing balance support: During functional activity, Reliant on assistive device for balance, No upper extremity supported Standing balance-Leahy Scale: Fair Standing balance comment: static standing fair - dynamic of  support at [employer] Arousal: Alert Behavior During Therapy: [facility] for tasks assessed/performed Overall Cognitive Status: Within Functional Limits for tasks assessed                                          Exercises Total Joint Exercises Ankle Circles/Pumps: AROM, Both, 5 reps Quad Sets:  (educated/instructed pt and wife, deferred d/t pain/burning) Heel Slides:  (instructed in heels slides with gait belt)    General Comments        Pertinent Vitals/Pain Pain Assessment Pain Assessment: 0-10 Pain Score: 5  Pain Location:  R hip Pain Descriptors / Indicators: Aching, Constant, Discomfort, Operative site guarding Pain Intervention(s): Limited activity within patient's tolerance, Monitored during session, Premedicated before session, Repositioned, Ice applied    Home Living                          Prior Function            PT Goals (current goals can now be found in the care plan section) Acute Rehab PT Goals Patient Stated Goal: to be able to walk (more  normal gait pattern) and work without pain as well as  be intimate with wife PT Goal Formulation: With patient/family Time For Goal Achievement: 10/20/22 Potential to Achieve Goals: Good    Frequency    7X/week      PT Plan      Co-evaluation              AM-PAC PT "6 Clicks" Mobility   Outcome Measure  Help needed turning from your back to your side while in a flat bed without using bedrails?: A Little Help needed moving from lying on your back to sitting on the side of a flat bed without using bedrails?: A Little Help needed moving to and from a bed to a chair (including a wheelchair)?: A Little Help needed standing up from a chair using your arms (e.g., wheelchair or bedside chair)?: A Little Help needed to walk in hospital room?: A Little Help needed climbing 3-5 steps with a railing? : A Little 6 Click Score: 18    End of Session Equipment Utilized During Treatment: Gait belt Activity Tolerance: Patient tolerated treatment well Patient left: in chair;with call bell/phone within reach;with family/visitor present Nurse Communication: Mobility status PT Visit Diagnosis: Unsteadiness on feet (R26.81);Other abnormalities of gait and mobility (R26.89);Muscle weakness (generalized) (M62.81);Difficulty in walking, not elsewhere classified (R26.2);Pain Pain - Right/Left: Right Pain - part of body: Leg;Hip     Time: 1114-1140 PT Time Calculation (min) (ACUTE ONLY): 26 min  Charges:    $Gait Training: 23-37 mins PT General Charges $$ ACUTE PT VISIT: 1 Visit                     Dacie Mandel, PT  Acute Rehab Dept [facility]) [phone]  10/07/2022    [facility] 10/07/2022, 11:54 AM

## 2022-10-07 NOTE — Discharge Instructions (Signed)

## 2022-10-07 NOTE — Progress Notes (Signed)
Subjective: 1 Day Post-Op Procedure(s) (LRB): RIGHT TOTAL HIP ARTHROPLASTY ANTERIOR APPROACH (Right) Patient reports pain as moderate.  Has not been up with PT yet this am.  Objective: Vital signs in last 24 hours: Temp:  [97.7 F (36.5 C)-99.3 F (37.4 C)] 99.1 F (37.3 C) (08/31 0547) Pulse Rate:  [56-88] 88 (08/31 0547) Resp:  [13-19] 17 (08/31 0547) BP: (91-132)/(55-83) 108/69 (08/31 0547) SpO2:  [95 %-100 %] 96 % (08/31 0547)  Intake/Output from previous day: 08/30 0701 - 08/31 0700 In: 2528.9 [I.V.:2330.6; IV Piggyback:198.3] Out: 3450 [Urine:3150; Blood:300] Intake/Output this shift: No intake/output data recorded.  Recent Labs    10/07/22 0328  HGB 12.5*   Recent Labs    10/07/22 0328  WBC 15.1*  RBC 4.30  HCT 36.8*  PLT 248   Recent Labs    10/07/22 0328  NA 137  K 3.3*  CL 101  CO2 25  BUN 15  CREATININE 0.92  GLUCOSE 141*  CALCIUM 8.9   No results for input(s): "LABPT", "INR" in the last 72 hours.  Sensation intact distally Intact pulses distally Dorsiflexion/Plantar flexion intact Incision: scant drainage   Assessment/Plan: 1 Day Post-Op Procedure(s) (LRB): RIGHT TOTAL HIP ARTHROPLASTY ANTERIOR APPROACH (Right) Up with therapy Discharge home with home health this afternoon if clears therapy.      Kathryne Hitch 10/07/2022, 10:32 AM

## 2022-10-07 NOTE — Discharge Summary (Signed)
Patient ID: Sean [name] MRN: [id number] DOB/AGE: 1975-02-20 47 y.o.  Admit date: 10/06/2022 Discharge date: 10/07/2022  Admission Diagnoses:  Principal Problem:   Arthritis of right hip Active Problems:   Status post total replacement of right hip   Discharge Diagnoses:  Same  Past Medical History:  Diagnosis Date   Arthritis    Heartburn    Hyperlipidemia    Hypertension    Pre-diabetes    Sleep apnea    SOB (shortness of breath) on exertion    Swelling    feet and legs    Surgeries: Procedure(s): RIGHT TOTAL HIP ARTHROPLASTY ANTERIOR APPROACH on 10/06/2022   Consultants:   Discharged Condition: Improved  Hospital Course: Sean [name] is an 47 y.o. male who was admitted 10/06/2022 for operative treatment ofArthritis of right hip. Patient has severe unremitting pain that affects sleep, daily activities, and work/hobbies. After pre-op clearance the patient was taken to the operating room on 10/06/2022 and underwent  Procedure(s): RIGHT TOTAL HIP ARTHROPLASTY ANTERIOR APPROACH.    Patient was given perioperative antibiotics:  Anti-infectives (From admission, onward)    Start     Dose/Rate Route Frequency Ordered Stop   10/06/22 1600  ceFAZolin (ANCEF) IVPB 2g/100 mL premix        2 g 200 mL/hr over 30 Minutes Intravenous Every 6 hours 10/06/22 1333 10/06/22 2210   10/06/22 0730  ceFAZolin (ANCEF) IVPB 3g/100 mL premix        3 g 200 mL/hr over 30 Minutes Intravenous On call to O.R. 10/06/22 0719 10/06/22 1016        Patient was given sequential compression devices, early ambulation, and chemoprophylaxis to prevent DVT.  Patient benefited maximally from hospital stay and there were no complications.    Recent vital signs: Patient Vitals for the past 24 hrs:  BP Temp Temp src Pulse Resp SpO2  10/07/22 0547 108/69 99.1 F (37.3 C) Oral 88 17 96 %  10/07/22 0148 116/73 99.3 F (37.4 C) Oral 82 17 96 %  10/06/22 2133 112/66 99 F (37.2 C) Oral 77 18 97 %   10/06/22 1733 132/77 98.6 F (37 C) Oral 66 17 97 %  10/06/22 1536 116/75 98.2 F (36.8 C) Oral 66 15 98 %  10/06/22 1336 100/63 97.8 F (36.6 C) Oral (!) 56 15 98 %  10/06/22 1300 (!) 97/57 -- -- (!) 56 16 96 %  10/06/22 1245 91/60 -- -- (!) 56 18 97 %  10/06/22 1230 95/63 -- -- 66 18 95 %  10/06/22 1215 99/83 -- -- 64 13 95 %  10/06/22 1200 (!) 106/58 -- -- (!) 58 17 100 %  10/06/22 1150 (!) 110/55 97.7 F (36.5 C) -- 64 19 100 %     Recent laboratory studies:  Recent Labs    10/07/22 0328  WBC 15.1*  HGB 12.5*  HCT 36.8*  PLT 248  NA 137  K 3.3*  CL 101  CO2 25  BUN 15  CREATININE 0.92  GLUCOSE 141*  CALCIUM 8.9     Discharge Medications:   Allergies as of 10/07/2022   No Known Allergies      Medication List     STOP taking these medications    nabumetone 750 MG tablet Commonly known as: RELAFEN   traMADol 50 MG tablet Commonly known as: ULTRAM       TAKE these medications    aspirin 81 MG chewable tablet Chew 1 tablet (81 mg total) by mouth 2 (  two) times daily.   atorvastatin 40 MG tablet Commonly known as: LIPITOR Take 1 tablet (40 mg total) by mouth daily.   blood glucose meter kit and supplies Kit Dispense based on patient and insurance preference. Use up to four times daily as directed. (FOR ICD-9 250.00, 250.01).   chlorthalidone 25 MG tablet Commonly known as: HYGROTON Take 1 tablet (25 mg total) by mouth daily.   fenofibrate 160 MG tablet Take 1 tablet by mouth once daily   gabapentin 300 MG capsule Commonly known as: NEURONTIN Take 1 to 2 tablets in the evening to help with hip pain. What changed:  how much to take how to take this when to take this additional instructions   GLUCOSAMINE CHOND MSM FORMULA PO Take 1 tablet by mouth daily. With Potassium   ibuprofen 200 MG tablet Commonly known as: ADVIL Take 800 mg by mouth every 8 (eight) hours as needed for moderate pain.   metoprolol succinate 100 MG 24 hr  tablet Commonly known as: TOPROL-XL TAKE 1 & 1/2 (ONE & ONE-HALF) TABLETS BY MOUTH ONCE DAILY   multivitamin with minerals tablet Take 1 tablet by mouth daily.   NONFORMULARY OR COMPOUNDED ITEM Compression socks #1  As directed   Dx low ext edema   oxyCODONE 5 MG immediate release tablet Commonly known as: Oxy IR/ROXICODONE Take 1-2 tablets (5-10 mg total) by mouth every 6 (six) hours as needed for moderate pain (pain score 4-6). No more than 6 tablets per day.   Tart Cherry 1200 MG Caps Take 1,200 mg by mouth daily.   tirzepatide 15 MG/0.5ML Pen Commonly known as: MOUNJARO Inject 15 mg into the skin once a week.   tiZANidine 4 MG tablet Commonly known as: ZANAFLEX Take 1 tablet (4 mg total) by mouth every 6 (six) hours as needed for muscle spasms.   traZODone 50 MG tablet Commonly known as: DESYREL TAKE 1/2 TO 1 (ONE-HALF TO ONE) TABLET BY MOUTH AT BEDTIME AS NEEDED FOR SLEEP   TURMERIC CURCUMIN PO Take 1 capsule by mouth daily. 1500 mg   vitamin C 1000 MG tablet Take 1,000 mg by mouth daily.   Vitamin D (Ergocalciferol) 1.25 MG (50000 UNIT) Caps capsule Commonly known as: DRISDOL Take 1 capsule (50,000 Units total) by mouth once a week.               Durable Medical Equipment  (From admission, onward)           Start     Ordered   10/06/22 1334  DME 3 n 1  Once        10/06/22 1333   10/06/22 1334  DME Walker rolling  Once       Question Answer Comment  Walker: With 5 Inch Wheels   Patient needs a walker to treat with the following condition Status post total replacement of right hip      10/06/22 1333            Diagnostic Studies: DG Pelvis Portable  Result Date: 10/06/2022 CLINICAL DATA:  Status post right hip arthroplasty. Anterior approach. EXAM: PORTABLE PELVIS 1-2 VIEWS COMPARISON:  Right hip radiographs 03/13/2022 FINDINGS: Interval total right hip arthroplasty. No perihardware lucency is seen to indicate hardware failure or loosening.  Expected postoperative changes including lateral right pelvis and proximal thigh subcutaneous air and lateral surgical skin staples. Mild superior left femoroacetabular joint space narrowing and peripheral osteophytosis. Mild bilateral sacroiliac subchondral sclerosis. No acute fracture or dislocation. IMPRESSION: Interval  total right hip arthroplasty without evidence of hardware failure or loosening. Electronically Signed   By: Neita Garnet M.D.   On: 10/06/2022 13:51   DG HIP UNILAT WITH PELVIS 1V RIGHT  Result Date: 10/06/2022 CLINICAL DATA:  Elective surgery. Total right hip arthroplasty. Intraoperative fluoroscopy. EXAM: DG HIP (WITH OR WITHOUT PELVIS) 1V RIGHT COMPARISON:  Pelvis and right hip radiographs 03/13/2022 FINDINGS: Images were performed intraoperatively without the presence of a radiologist. Severe right femoroacetabular osteoarthritis. Uncovering of the superolateral right femoral head secondary to acetabular dysplasia. The patient is undergoing total right hip arthroplasty. No hardware complication is seen. Total fluoroscopy images: 6 Total fluoroscopy time: 22 seconds Total dose: Radiation Exposure Index (as provided by the fluoroscopic device): 7.57 mGy air Kerma Please see intraoperative findings for further detail. IMPRESSION: Intraoperative fluoroscopy for total right hip arthroplasty. Electronically Signed   By: Neita Garnet M.D.   On: 10/06/2022 13:02   DG C-Arm 1-60 Min-No Report  Result Date: 10/06/2022 Fluoroscopy was utilized by the requesting physician.  No radiographic interpretation.    Disposition: Discharge disposition: 01-Home or Self Care          Follow-up Information     Kathryne Hitch, MD Follow up in 2 week(s).   Specialty: Orthopedic Surgery Contact information: [address] [place] [address] [phone]                  Signed: Kathryne Hitch 10/07/2022, 11:01 AM

## 2022-10-10 ENCOUNTER — Encounter (HOSPITAL_COMMUNITY): Payer: Self-pay | Admitting: Orthopaedic Surgery

## 2022-10-11 ENCOUNTER — Ambulatory Visit: Payer: 59 | Attending: Orthopaedic Surgery | Admitting: Physical Therapy

## 2022-10-11 ENCOUNTER — Other Ambulatory Visit: Payer: Self-pay

## 2022-10-11 ENCOUNTER — Encounter: Payer: Self-pay | Admitting: Physical Therapy

## 2022-10-11 ENCOUNTER — Telehealth: Payer: Self-pay

## 2022-10-11 DIAGNOSIS — Z471 Aftercare following joint replacement surgery: Secondary | ICD-10-CM | POA: Diagnosis present

## 2022-10-11 DIAGNOSIS — M6281 Muscle weakness (generalized): Secondary | ICD-10-CM | POA: Diagnosis not present

## 2022-10-11 DIAGNOSIS — M25551 Pain in right hip: Secondary | ICD-10-CM | POA: Diagnosis not present

## 2022-10-11 DIAGNOSIS — R262 Difficulty in walking, not elsewhere classified: Secondary | ICD-10-CM | POA: Diagnosis not present

## 2022-10-11 DIAGNOSIS — Z96641 Presence of right artificial hip joint: Secondary | ICD-10-CM | POA: Diagnosis present

## 2022-10-11 IMAGING — DX DG HIP (WITH OR WITHOUT PELVIS) 2-3V*R*
3 series · 3 of 3 positions shown · non-contrast
Comparison: None.

CLINICAL DATA: Right hip pain

EXAM:
DG HIP (WITH OR WITHOUT PELVIS) 2-3V RIGHT

[pelvis ap]
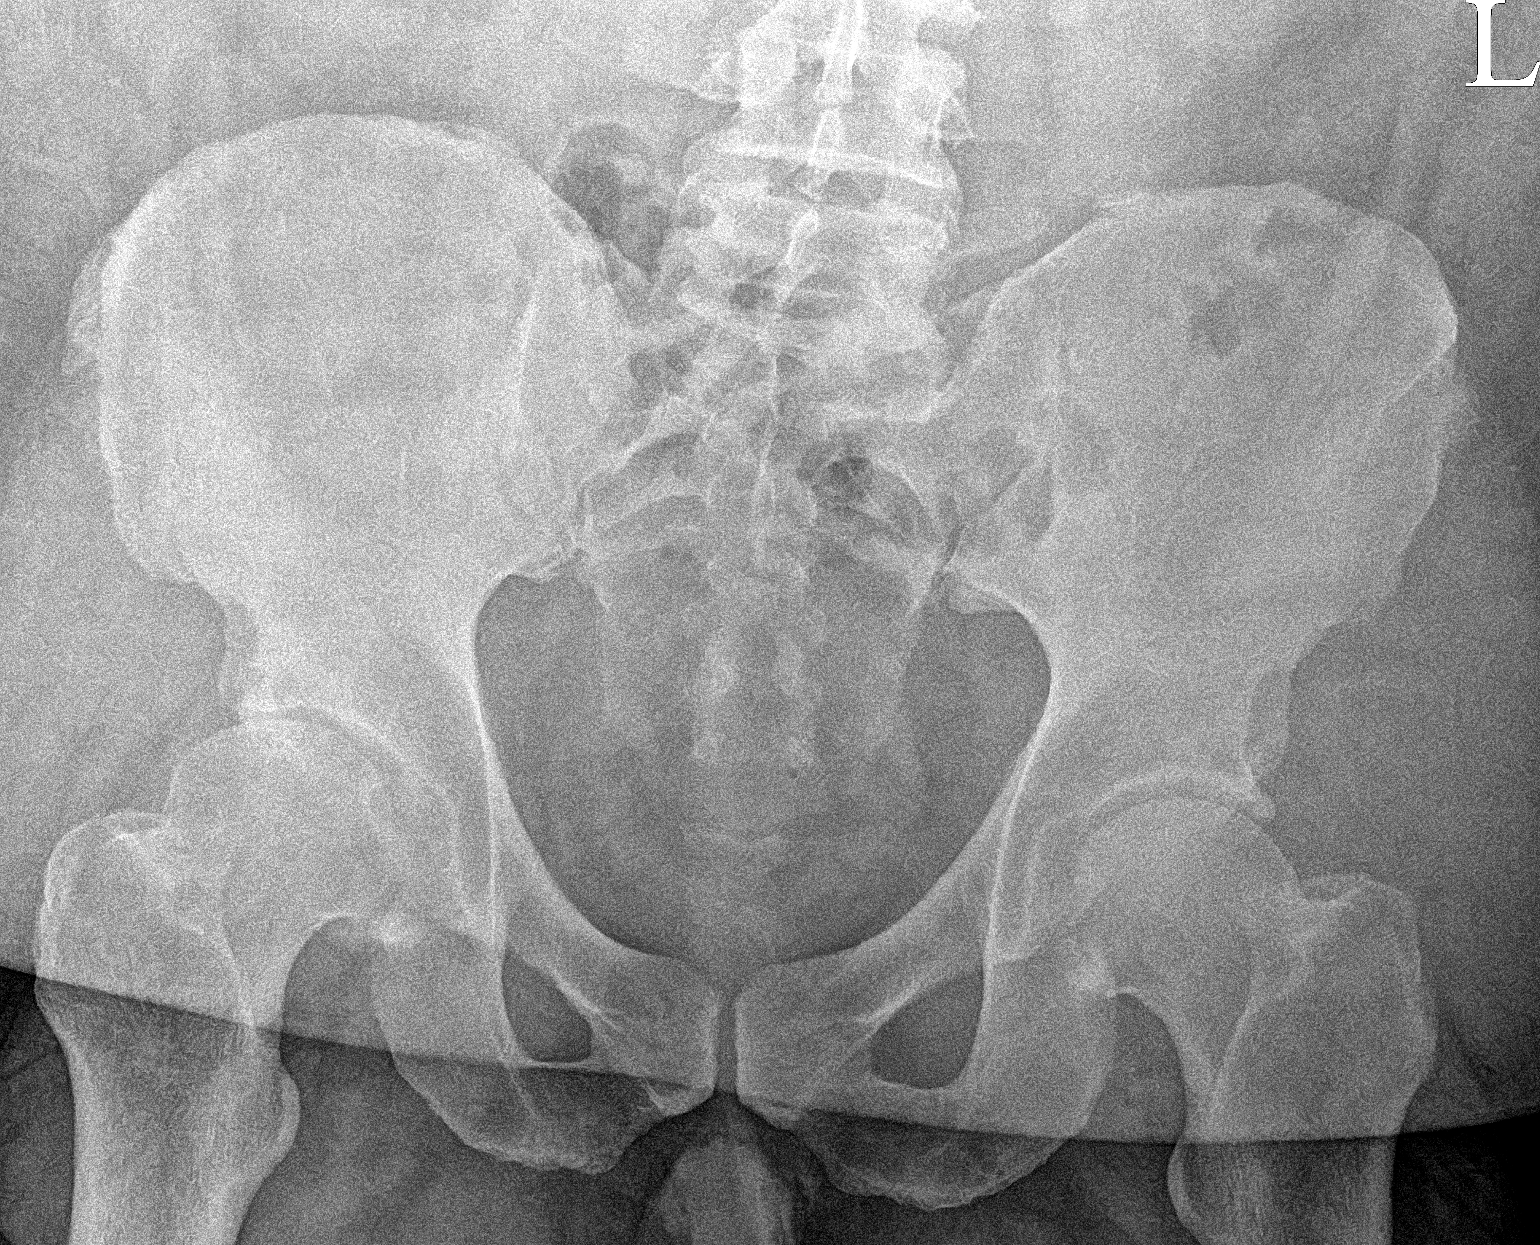

[hip ap]
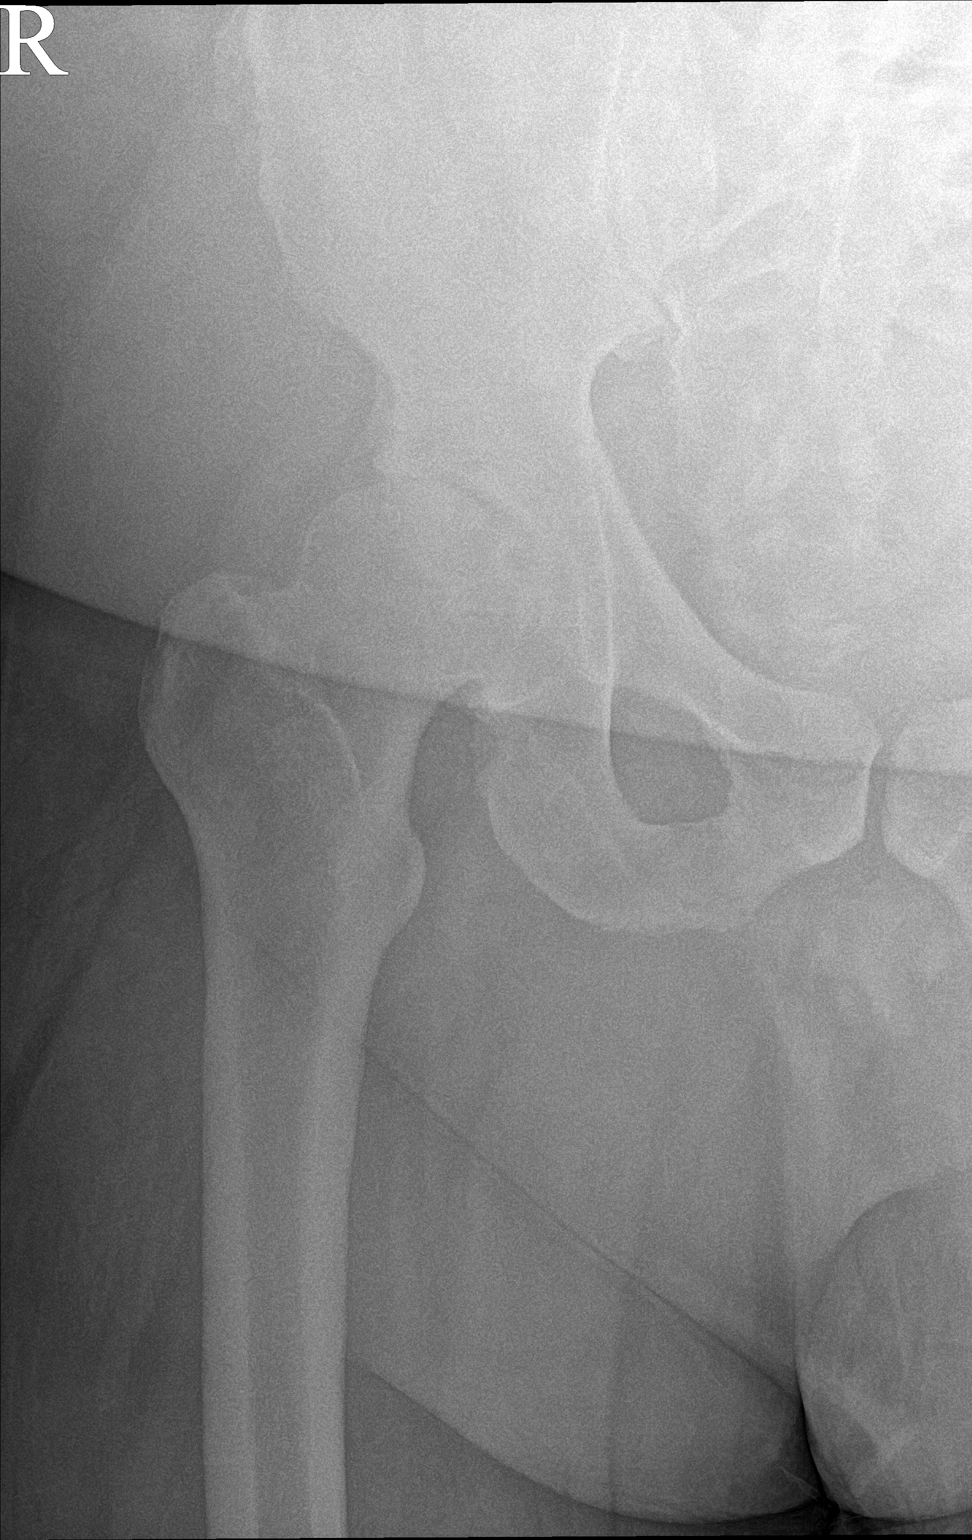

[hip frog leg]
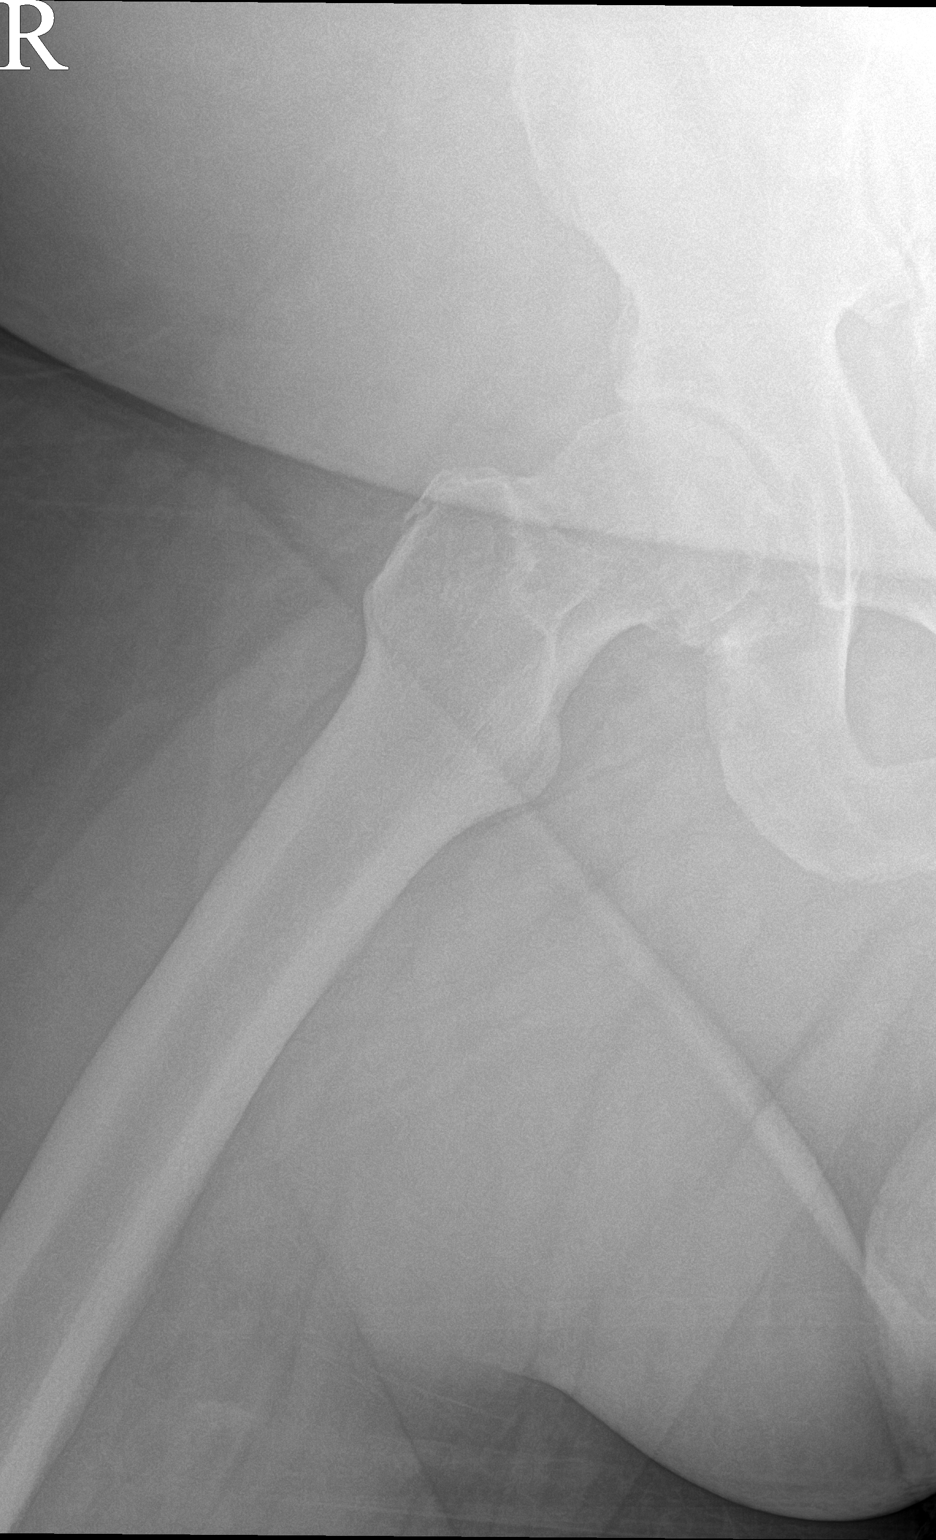

[3 of 3 positions shown; findings below may reference images not displayed]

FINDINGS: Mild right hip degenerative arthritis. No acute fracture or
dislocation. Limited evaluation of the left hip is unremarkable.
Soft tissues are unremarkable.
IMPRESSION: Mild asymmetric right hip degenerative arthritis.

## 2022-10-11 NOTE — Therapy (Signed)
OUTPATIENT PHYSICAL THERAPY LOWER EXTREMITY EVALUATION   Patient Name: Sean [name] MRN: [id number] DOB:04/20/1975, 47 y.o., male Today's Date: 10/11/2022  END OF SESSION:  PT End of Session - 10/11/22 1553     Visit Number 1    Number of Visits 16    Date for PT Re-Evaluation 12/06/22    Authorization Type [employer]    PT Start Time 1515    PT Stop Time 1551    PT Time Calculation (min) 36 min    Activity Tolerance Patient tolerated treatment well    Behavior During Therapy WFL for tasks assessed/performed             Past Medical History:  Diagnosis Date   Arthritis    Heartburn    Hyperlipidemia    Hypertension    Pre-diabetes    Sleep apnea    SOB (shortness of breath) on exertion    Swelling    feet and legs   Past Surgical History:  Procedure Laterality Date   NO PAST SURGERIES     TOTAL HIP ARTHROPLASTY Right 10/06/2022   Procedure: RIGHT TOTAL HIP ARTHROPLASTY ANTERIOR APPROACH;  Surgeon: Kathryne Hitch, MD;  Location: [facility];  Service: Orthopedics;  Laterality: Right;   Patient Active Problem List   Diagnosis Date Noted   Status post total replacement of right hip 10/06/2022   Low back pain 02/24/2021   Arthritis of right hip 01/16/2021   Bronchitis due to COVID-19 virus 11/01/2020   Hip pain 11/01/2020   Right thigh pain 05/13/2019   Uncontrolled type 2 diabetes mellitus with hyperglycemia (HCC) 04/10/2019   Hyperlipidemia associated with type 2 diabetes mellitus (HCC) 04/10/2019   Cutaneous skin tags 04/10/2019   Class 3 severe obesity with serious comorbidity and body mass index (BMI) of 50.0 to 59.9 in adult [facility]) 03/18/2018   Cellulitis of left lower extremity 09/07/2017   History of cellulitis 08/19/2017   Hypokalemia 08/19/2017   Lower extremity edema 08/19/2017   Depression 02/15/2017   Prediabetes 09/19/2016   Vitamin D deficiency 09/19/2016   Other fatigue 09/05/2016   Dyspnea on exertion 09/05/2016   Snoring 09/05/2016    Morbid obesity (HCC) 09/05/2016   Patellofemoral arthritis of right knee 08/11/2016   Severe obesity (BMI >= 40) (HCC) 05/09/2015   Hyperlipidemia 02/19/2014   Sinusitis, acute maxillary 02/09/2014   Infected laceration 02/09/2014   HTN (hypertension) 03/13/2011    PCP: Seabron Spates  REFERRING PROVIDER: Doneen Poisson  REFERRING DIAG: s/p Rt THA  THERAPY DIAG:  Muscle weakness (generalized)  Pain in right hip  Difficulty in walking, not elsewhere classified  Rationale for Evaluation and Treatment: Rehabilitation  ONSET DATE: 10/06/22  SUBJECTIVE:   SUBJECTIVE STATEMENT: Pt is s/p Rt THA 10/06/22. He stayed in the hospital overnight and was released the next day. Pt states the most difficulty he is having right now is finding a comfortable position to sleep. He has been using ice for pain control and uses pain meds at night. He has been performing gait with RW for about a block a few times a day. He has been able to shower independently. He is still not driving.  PERTINENT HISTORY: Arthritis, HTN PAIN:  Are you having pain? Yes: NPRS scale: 4-5/10 currenlty, up to 8/10 at times/10 Pain location: Rt hip Pain description: sore, burning Aggravating factors: certain positions Relieving factors: ice, meds  PRECAUTIONS: None  RED FLAGS: None   WEIGHT BEARING RESTRICTIONS: No  FALLS:  Has patient fallen  in last 6 months? 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  PLOF: Independent  PATIENT GOALS: return to work mid October, be able to sleep, get up into his truck  NEXT MD VISIT: 10/19/22  OBJECTIVE:    PATIENT SURVEYS:  FOTO 49  COGNITION: Overall cognitive status: Within functional limits for tasks  assessed     SENSATION: WFL    PALPATION: TTP throughout Rt hip  LOWER EXTREMITY ROM:  Active ROM Right eval Left eval  Hip flexion 48   Hip extension -18   Hip abduction 0   Hip adduction    Hip internal rotation    Hip external rotation    Knee flexion    Knee extension    Ankle dorsiflexion    Ankle plantarflexion    Ankle inversion    Ankle eversion     (Blank rows = not tested)  LOWER EXTREMITY MMT:  MMT Right eval Left eval  Hip flexion 3-   Hip extension 3-   Hip abduction 3-   Hip adduction    Hip internal rotation    Hip external rotation    Knee flexion 3   Knee extension 3   Ankle dorsiflexion    Ankle plantarflexion    Ankle inversion    Ankle eversion     (Blank rows = not tested)   GAIT: Distance walked: 110' Assistive device utilized: Environmental consultant - 2 wheeled Level of assistance: Complete Independence Comments: decreased cadence, decreased step and stride length   TODAY'S TREATMENT:                                                                                                                              DATE: 10/11/22 Hip flex, ext, abd slides in standing for AROM - in tolerable range Seated LAQ, HS curl    PATIENT EDUCATION:  Education details: PT POC and goals, HEP Person educated: Patient Education method: Explanation, Demonstration, and Handouts Education comprehension: verbalized understanding and returned demonstration  HOME EXERCISE PROGRAM: Access Code: [email or web address] URL: [email or web address] Date: 10/11/2022 Prepared by: Reggy Eye  Exercises - Seated Long Arc Quad  - 1 x daily - 7 x weekly - 3 sets - 10 reps - Seated Knee Flexion Slide  - 1 x daily - 7 x weekly - 3 sets - 10 reps - Standing Hip Abduction  - 1 x daily - 7 x weekly - 3 sets - 10 reps - Standing Hip Extension  - 1 x daily - 7 x weekly - 3 sets - 10 reps - Standing Hip Flexion with Counter Support  - 1 x daily - 7 x weekly - 3 sets - 10  reps  ASSESSMENT:  CLINICAL IMPRESSION: Patient is a 47 y.o.  male who was seen today for physical therapy evaluation and treatment for s/p Rt THA. Pt presents with impaired gait and balance, decreased strength and ROM, decreased functional activity tolerance and will benefit from skilled PT to address deficits and improve functional mobility and independence.   OBJECTIVE IMPAIRMENTS: decreased activity tolerance, decreased balance, decreased mobility, difficulty walking, decreased ROM, decreased strength, and pain.   ACTIVITY LIMITATIONS: standing, squatting, stairs, transfers, bed mobility, and locomotion level  PARTICIPATION LIMITATIONS: meal prep, cleaning, driving, community activity, occupation, and yard work  PERSONAL FACTORS: Profession are also affecting patient's functional outcome.   REHAB POTENTIAL: Good  CLINICAL DECISION MAKING: Stable/uncomplicated  EVALUATION COMPLEXITY: Low   GOALS: Goals reviewed with patient? Yes  SHORT TERM GOALS: Target date: 11/08/2022   Pt will be independent with initial HEP Baseline: Goal status: INITIAL  2.  Pt will improve Rt hip strength to 3+/5 to improve gait and bed mobility Baseline:  Goal status: INITIAL   LONG TERM GOALS: Target date: 12/06/2022    Pt will be independent in advanced HEP Baseline:  Goal status: INITIAL  2.  Pt will improve FOTO to >= 72 to demo improved functional mobility Baseline:  Goal status: INITIAL  3.  Pt will improve Rt hip strength to 4+/5 to return to work duties Baseline:  Goal status: INITIAL  4.  Pt will perform gait with normalized gait pattern without AD x 150' to return to work Baseline:  Goal status: INITIAL  5.  Pt will perform supine <> sit and rolling in bed with pain <= 2/10 Baseline:  Goal status: INITIAL    PLAN:  PT FREQUENCY: 2x/week  PT DURATION: 8 weeks  PLANNED INTERVENTIONS: Therapeutic exercises, Therapeutic activity, Neuromuscular re-education, Balance  training, Gait training, Patient/Family education, Self Care, Joint mobilization, Aquatic Therapy, Dry Needling, Electrical stimulation, Cryotherapy, Moist heat, Taping, Ionotophoresis 4mg /ml Dexamethasone, Manual therapy, and Re-evaluation  PLAN FOR NEXT SESSION: assess response to HEP, hip strength, gait   Kimbria Camposano, PT 10/11/2022, 3:55 PM

## 2022-10-11 NOTE — Transitions of Care (Post Inpatient/ED Visit) (Signed)
10/11/2022  Name: Sean [name] MRN: [id number] DOB: 07/22/1975  Today's TOC FU Call Status: Today's TOC FU Call Status:: Successful TOC FU Call Completed TOC FU Call Complete Date: 10/11/22 Patient's Name and Date of Birth confirmed.  Transition Care Management Follow-up Telephone Call Date of Discharge: 10/07/22 Discharge Facility: [facility]) Type of Discharge: Inpatient Admission Primary Inpatient Discharge Diagnosis:: Arthrtis of Right Hip How have you been since you were released from the hospital?: Better Any questions or concerns?: No  Items Reviewed: Medications obtained,verified, and reconciled?: Yes (Medications Reviewed) Any new allergies since your discharge?: No Dietary orders reviewed?: NA Do you have support at home?: Yes People in Home: spouse  Medications Reviewed Today: Medications Reviewed Today     Reviewed by Anthoney Harada, LPN (Licensed Practical Nurse) on 10/11/22 at 1234  Med List Status: <None>   Medication Order Taking? Sig Documenting Provider Last Dose Status Informant  Ascorbic Acid (VITAMIN C) 1000 MG tablet [id number] Yes Take 1,000 mg by mouth daily. [provider] Taking Active Self  aspirin 81 MG chewable tablet [id number] Yes Chew 1 tablet (81 mg total) by mouth 2 (two) times daily. Kathryne Hitch, MD Taking Active   atorvastatin (LIPITOR) 40 MG tablet [id number] Yes Take 1 tablet (40 mg total) by mouth daily. Donato Schultz, DO Taking Active Self  blood glucose meter kit and supplies KIT [id number] Yes Dispense based on patient and insurance preference. Use up to four times daily as directed. (FOR ICD-9 250.00, 250.01). Donato Schultz, DO Taking Active Self  chlorthalidone (HYGROTON) 25 MG tablet [id number] Yes Take 1 tablet (25 mg total) by mouth daily. Donato Schultz, DO Taking Active Self  fenofibrate 160 MG tablet [id number] Yes Take 1 tablet by mouth once daily Zola Button, Grayling Congress, DO Taking  Active   gabapentin (NEURONTIN) 300 MG capsule [id number] Yes Take 1 to 2 tablets in the evening to help with hip pain.  Patient taking differently: Take 600 mg by mouth at bedtime.  to help with hip pain.   Donato Schultz, DO Taking Active Self  ibuprofen (ADVIL) 200 MG tablet [id number] Yes Take 800 mg by mouth every 8 (eight) hours as needed for moderate pain. [provider] Taking Active Self  metoprolol succinate (TOPROL-XL) 100 MG 24 hr tablet [id number] Yes TAKE 1 & 1/2 (ONE & ONE-HALF) TABLETS BY MOUTH ONCE DAILY Zola Button, Grayling Congress, DO Taking Active   Misc Natural Products (GLUCOSAMINE CHOND MSM FORMULA PO) [id number] Yes Take 1 tablet by mouth daily. With Potassium [provider] Taking Active Self  Multiple Vitamins-Minerals (MULTIVITAMIN WITH MINERALS) tablet [id number] Yes Take 1 tablet by mouth daily. [provider] Taking Active Self  NONFORMULARY OR COMPOUNDED ITEM [id number] Yes Compression socks #1  As directed   Dx low ext edema Zola Button, Grayling Congress, DO Taking Active Self  oxyCODONE (OXY IR/ROXICODONE) 5 MG immediate release tablet [id number] Yes Take 1-2 tablets (5-10 mg total) by mouth every 6 (six) hours as needed for moderate pain (pain score 4-6). No more than 6 tablets per day. Kathryne Hitch, MD Taking Active   Tart Cherry 1200 MG CAPS [id number] Yes Take 1,200 mg by mouth daily. [provider] Taking Active Self  tirzepatide Greggory Keen) 15 MG/0.5ML Pen [id number] Yes Inject 15 mg into the skin once a week. Seabron Spates R, DO Taking Active Self  tiZANidine (ZANAFLEX) 4 MG tablet [id number] Yes Take 1 tablet (4  mg total) by mouth every 6 (six) hours as needed for muscle spasms. Kathryne Hitch, MD Taking Active   traZODone (DESYREL) 50 MG tablet [id number] Yes TAKE 1/2 TO 1 (ONE-HALF TO ONE) TABLET BY MOUTH AT BEDTIME AS NEEDED FOR SLEEP Donato Schultz, DO Taking Active   TURMERIC CURCUMIN PO [id number] Yes  Take 1 capsule by mouth daily. 1500 mg [provider] Taking Active Self  Vitamin D, Ergocalciferol, (DRISDOL) 1.25 MG (50000 UNIT) CAPS capsule [id number] Yes Take 1 capsule (50,000 Units total) by mouth once a week. Donato Schultz, DO Taking Active Self            Home Care and Equipment/Supplies: Were Home Health Services Ordered?: No Any new equipment or medical supplies ordered?: Yes Were you able to get the equipment/medical supplies?: Yes Do you have any questions related to the use of the equipment/supplies?: No  Functional Questionnaire: Do you need assistance with bathing/showering or dressing?: No Do you need assistance with meal preparation?: Yes Do you need assistance with eating?: No Do you have difficulty maintaining continence: No Do you need assistance with getting out of bed/getting out of a chair/moving?: No Do you have difficulty managing or taking your medications?: No  Follow up appointments reviewed: PCP Follow-up appointment confirmed?: NA Specialist Hospital Follow-up appointment confirmed?: Yes Date of Specialist follow-up appointment?: 10/19/22 Follow-Up Specialty Provider:: Dr. Doneen Poisson Do you need transportation to your follow-up appointment?: No Do you understand care options if your condition(s) worsen?: Yes-patient verbalized understanding    SIGNATURE Kandis Fantasia, LPN [facility] Health Advisor [place] [facility] You Are. We Are. [employer] # [phone]

## 2022-10-13 MED ORDER — TIZANIDINE HCL 4 MG PO TABS: 4.0000 mg | ORAL_TABLET | Freq: Four times a day (QID) | ORAL | 0 refills | Status: DC | PRN

## 2022-10-13 NOTE — Telephone Encounter (Signed)
Pt called due to needing a refill on his meds. He stated he had surgery August 30th on hip. Dr. Magnus Ivan is having him take Tizanidine 4mg  1 tablet every 6 hours. He has an appt coming up Sept 12th but he will run out before then. Asked if he could get a refill til his appt. Catalina Lunger [phone]

## 2022-10-16 ENCOUNTER — Ambulatory Visit: Payer: 59

## 2022-10-16 DIAGNOSIS — R29898 Other symptoms and signs involving the musculoskeletal system: Secondary | ICD-10-CM

## 2022-10-16 DIAGNOSIS — R262 Difficulty in walking, not elsewhere classified: Secondary | ICD-10-CM

## 2022-10-16 DIAGNOSIS — Z471 Aftercare following joint replacement surgery: Secondary | ICD-10-CM | POA: Diagnosis not present

## 2022-10-16 DIAGNOSIS — M6281 Muscle weakness (generalized): Secondary | ICD-10-CM

## 2022-10-16 DIAGNOSIS — M25551 Pain in right hip: Secondary | ICD-10-CM

## 2022-10-16 NOTE — Therapy (Signed)
OUTPATIENT PHYSICAL THERAPY LOWER EXTREMITY TREATMENT   Patient Name: Sean [name] MRN: [id number] DOB:01/31/1976, 47 y.o., male Today's Date: 10/16/2022  END OF SESSION:  PT End of Session - 10/16/22 1529     Visit Number 2    Number of Visits 16    Date for PT Re-Evaluation 12/06/22    Authorization Type [employer]    PT Start Time 1532    PT Stop Time 1615    PT Time Calculation (min) 43 min    Activity Tolerance Patient tolerated treatment well    Behavior During Therapy WFL for tasks assessed/performed             Past Medical History:  Diagnosis Date   Arthritis    Heartburn    Hyperlipidemia    Hypertension    Pre-diabetes    Sleep apnea    SOB (shortness of breath) on exertion    Swelling    feet and legs   Past Surgical History:  Procedure Laterality Date   NO PAST SURGERIES     TOTAL HIP ARTHROPLASTY Right 10/06/2022   Procedure: RIGHT TOTAL HIP ARTHROPLASTY ANTERIOR APPROACH;  Surgeon: Kathryne Hitch, MD;  Location: [facility];  Service: Orthopedics;  Laterality: Right;   Patient Active Problem List   Diagnosis Date Noted   Status post total replacement of right hip 10/06/2022   Low back pain 02/24/2021   Arthritis of right hip 01/16/2021   Bronchitis due to COVID-19 virus 11/01/2020   Hip pain 11/01/2020   Right thigh pain 05/13/2019   Uncontrolled type 2 diabetes mellitus with hyperglycemia (HCC) 04/10/2019   Hyperlipidemia associated with type 2 diabetes mellitus (HCC) 04/10/2019   Cutaneous skin tags 04/10/2019   Class 3 severe obesity with serious comorbidity and body mass index (BMI) of 50.0 to 59.9 in adult [facility]) 03/18/2018   Cellulitis of left lower extremity 09/07/2017   History of cellulitis 08/19/2017   Hypokalemia 08/19/2017   Lower extremity edema 08/19/2017   Depression 02/15/2017   Prediabetes 09/19/2016   Vitamin D deficiency 09/19/2016   Other fatigue 09/05/2016   Dyspnea on exertion 09/05/2016   Snoring 09/05/2016   Morbid  obesity (HCC) 09/05/2016   Patellofemoral arthritis of right knee 08/11/2016   Severe obesity (BMI >= 40) (HCC) 05/09/2015   Hyperlipidemia 02/19/2014   Sinusitis, acute maxillary 02/09/2014   Infected laceration 02/09/2014   HTN (hypertension) 03/13/2011    PCP: Seabron Spates  REFERRING PROVIDER: Doneen Poisson  REFERRING DIAG: s/p Rt THA  THERAPY DIAG:  Muscle weakness (generalized)  Pain in right hip  Difficulty in walking, not elsewhere classified  Other symptoms and signs involving the musculoskeletal system  Rationale for Evaluation and Treatment: Rehabilitation  ONSET DATE: 10/06/22  SUBJECTIVE:   SUBJECTIVE STATEMENT: Patient arrived to PT with SPC. Patient reports he is still uncomfortable sleeping, states he needs to be on side to fall asleep, doesn't do well on his back. Patient states the nerve pain is getting better but can have sharp pain with certain movements. Patient states he is still walking with FWW and navigating stairs  at home.   EVAL: Pt is s/p Rt THA 10/06/22. He stayed in the hospital overnight and was released the next day. Pt states the most difficulty he is having right now is finding a comfortable position to sleep. He has been using ice for pain control and uses pain meds at night. He has been performing gait with RW for about a block a few  times a day. He has been able to shower independently. He is still not driving.  PERTINENT HISTORY: Arthritis, HTN PAIN:  Are you having pain? Yes: NPRS scale: 4-5/10 currenlty, up to 8/10 at times/10 Pain location: Rt hip Pain description: sore, burning Aggravating factors: certain positions Relieving factors: ice, meds  PRECAUTIONS: None  RED FLAGS: None   WEIGHT BEARING RESTRICTIONS: No  FALLS:  Has patient fallen in last 6 months? 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  PLOF: Independent  PATIENT GOALS: return to work mid October, be able to sleep, get up into his truck  NEXT MD VISIT: 10/19/22  OBJECTIVE:    PATIENT SURVEYS:  FOTO 49  COGNITION: Overall cognitive status: Within functional limits for tasks assessed     SENSATION: WFL    PALPATION: TTP throughout Rt hip  LOWER EXTREMITY ROM:  Active ROM Right eval Left eval  Hip flexion 48   Hip extension -18   Hip abduction 0   Hip adduction    Hip internal rotation    Hip external rotation    Knee flexion    Knee extension    Ankle dorsiflexion    Ankle plantarflexion    Ankle inversion    Ankle eversion     (Blank rows = not tested)  LOWER EXTREMITY MMT:  MMT Right eval Left eval  Hip flexion 3-   Hip extension 3-   Hip abduction 3-   Hip adduction    Hip internal rotation    Hip external rotation    Knee flexion 3   Knee extension 3   Ankle dorsiflexion    Ankle plantarflexion    Ankle inversion    Ankle eversion     (Blank rows = not tested)   GAIT: Distance walked: 110' Assistive device utilized: Environmental consultant - 2 wheeled Level of assistance: Complete Independence Comments: decreased cadence, decreased step and stride length   TODAY'S TREATMENT:    [facility] Adult PT Treatment:                                                DATE: 10/16/2022 Therapeutic Exercise: Standing: Hip abd & ext (L) x10 each Heel raises x15 Seated: Knee flexion (R) with slider x10 M/L (small range) with slider x10 LAQ (R) 10x5" Standing marching  Seated self-massage with myofascial ball and roller stick Therapeutic Activity: Adjusting SPC for proper height --> gait training with SPC Lateral weight shifting on R LE Staggered stance fwd weight shifting on R LE Discussion on awareness of functional hip flexion and equal weigh bearing  DATE: 10/11/22 Hip flex, ext, abd slides in standing for AROM - in tolerable range Seated LAQ, HS curl    PATIENT EDUCATION:  Education details: PT POC and goals, HEP Person educated: Patient Education method: Explanation, Demonstration, and Handouts Education comprehension: verbalized understanding and returned demonstration  HOME EXERCISE PROGRAM: Access Code: [email or web address] URL: [email or web address] Date: 10/11/2022 Prepared by: Reggy Eye  Exercises - Seated Long Arc Quad  - 1 x daily - 7 x weekly - 3 sets - 10 reps - Seated Knee Flexion Slide  - 1 x daily - 7 x weekly - 3 sets - 10 reps - Standing Hip Abduction  - 1 x daily - 7 x weekly - 3 sets - 10 reps - Standing Hip Extension  - 1 x daily - 7 x weekly - 3 sets - 10 reps - Standing Hip Flexion with Counter Support  - 1 x daily - 7 x weekly - 3 sets - 10 reps  ASSESSMENT:  CLINICAL IMPRESSION: SPC adjusted for proper height; gait training performed to improve tolerance with weight bearing on R LE and decreased L trunk lean compensation. Weight shifting in lateral and forward directions performed, cueing glute activation with weight bearing on R LE.    EVAL: Patient is a 47 y.o. male who was seen today for physical therapy evaluation and treatment for s/p Rt THA. Pt presents with impaired gait and balance, decreased strength and ROM, decreased functional activity tolerance and will benefit from skilled PT to address deficits and improve functional mobility and independence.   OBJECTIVE IMPAIRMENTS: decreased activity tolerance, decreased balance, decreased mobility, difficulty walking, decreased ROM, decreased strength, and pain.   ACTIVITY LIMITATIONS: standing, squatting, stairs, transfers, bed mobility, and locomotion level  PARTICIPATION LIMITATIONS: meal prep, cleaning, driving, community activity, occupation, and  yard work  PERSONAL FACTORS: Profession are also affecting patient's functional outcome.   REHAB POTENTIAL: Good  CLINICAL DECISION MAKING: Stable/uncomplicated  EVALUATION COMPLEXITY: Low   GOALS: Goals reviewed with patient? Yes  SHORT TERM GOALS: Target date: 11/08/2022  Pt will be independent with initial HEP Baseline: Goal status: INITIAL  2.  Pt will improve Rt hip strength to 3+/5 to improve gait and bed mobility Baseline:  Goal status: INITIAL   LONG TERM GOALS: Target date: 12/06/2022  Pt will be independent in advanced HEP Baseline:  Goal status: INITIAL  2.  Pt will improve FOTO to >= 72 to demo improved functional mobility Baseline:  Goal status: INITIAL  3.  Pt will improve Rt hip strength to 4+/5 to return to work duties Baseline:  Goal status: INITIAL  4.  Pt will perform gait with normalized gait pattern without AD x 150' to return to work Baseline:  Goal status: INITIAL  5.  Pt will perform supine <> sit and rolling in bed with pain <= 2/10 Baseline:  Goal status: INITIAL    PLAN:  PT FREQUENCY: 2x/week  PT DURATION: 8 weeks  PLANNED INTERVENTIONS: Therapeutic exercises, Therapeutic activity, Neuromuscular re-education, Balance training, Gait training, Patient/Family education, Self Care, Joint mobilization, Aquatic Therapy, Dry Needling, Electrical stimulation, Cryotherapy, Moist heat, Taping, Ionotophoresis 4mg /ml Dexamethasone, Manual therapy, and Re-evaluation  PLAN FOR NEXT SESSION: Progress hip and core strength, gait and stair training   Sanjuana Mae, PTA 10/16/2022, 4:18 PM

## 2022-10-17 ENCOUNTER — Other Ambulatory Visit: Payer: Self-pay | Admitting: Family Medicine

## 2022-10-17 DIAGNOSIS — G4700 Insomnia, unspecified: Secondary | ICD-10-CM

## 2022-10-18 ENCOUNTER — Other Ambulatory Visit: Payer: Self-pay | Admitting: Family Medicine

## 2022-10-18 ENCOUNTER — Encounter: Payer: Self-pay | Admitting: Physical Therapy

## 2022-10-18 ENCOUNTER — Ambulatory Visit: Payer: 59 | Admitting: Physical Therapy

## 2022-10-18 DIAGNOSIS — R29898 Other symptoms and signs involving the musculoskeletal system: Secondary | ICD-10-CM

## 2022-10-18 DIAGNOSIS — I10 Essential (primary) hypertension: Secondary | ICD-10-CM

## 2022-10-18 DIAGNOSIS — R262 Difficulty in walking, not elsewhere classified: Secondary | ICD-10-CM

## 2022-10-18 DIAGNOSIS — Z471 Aftercare following joint replacement surgery: Secondary | ICD-10-CM | POA: Diagnosis not present

## 2022-10-18 DIAGNOSIS — M25551 Pain in right hip: Secondary | ICD-10-CM

## 2022-10-18 DIAGNOSIS — M6281 Muscle weakness (generalized): Secondary | ICD-10-CM

## 2022-10-18 NOTE — Therapy (Signed)
OUTPATIENT PHYSICAL THERAPY LOWER EXTREMITY TREATMENT   Patient Name: Sean [name] MRN: [id number] DOB:19-Oct-1975, 47 y.o., male Today's Date: 10/18/2022  END OF SESSION:  PT End of Session - 10/18/22 1501     Visit Number 3    Number of Visits 16    Date for PT Re-Evaluation 12/06/22    Authorization Type [employer]    PT Start Time 1423    PT Stop Time 1501    PT Time Calculation (min) 38 min    Activity Tolerance Patient tolerated treatment well    Behavior During Therapy WFL for tasks assessed/performed              Past Medical History:  Diagnosis Date   Arthritis    Heartburn    Hyperlipidemia    Hypertension    Pre-diabetes    Sleep apnea    SOB (shortness of breath) on exertion    Swelling    feet and legs   Past Surgical History:  Procedure Laterality Date   NO PAST SURGERIES     TOTAL HIP ARTHROPLASTY Right 10/06/2022   Procedure: RIGHT TOTAL HIP ARTHROPLASTY ANTERIOR APPROACH;  Surgeon: Kathryne Hitch, MD;  Location: [facility];  Service: Orthopedics;  Laterality: Right;   Patient Active Problem List   Diagnosis Date Noted   Status post total replacement of right hip 10/06/2022   Low back pain 02/24/2021   Arthritis of right hip 01/16/2021   Bronchitis due to COVID-19 virus 11/01/2020   Hip pain 11/01/2020   Right thigh pain 05/13/2019   Uncontrolled type 2 diabetes mellitus with hyperglycemia (HCC) 04/10/2019   Hyperlipidemia associated with type 2 diabetes mellitus (HCC) 04/10/2019   Cutaneous skin tags 04/10/2019   Class 3 severe obesity with serious comorbidity and body mass index (BMI) of 50.0 to 59.9 in adult [facility]) 03/18/2018   Cellulitis of left lower extremity 09/07/2017   History of cellulitis 08/19/2017   Hypokalemia 08/19/2017   Lower extremity edema 08/19/2017   Depression 02/15/2017   Prediabetes 09/19/2016   Vitamin D deficiency 09/19/2016   Other fatigue 09/05/2016   Dyspnea on exertion 09/05/2016   Snoring 09/05/2016    Morbid obesity (HCC) 09/05/2016   Patellofemoral arthritis of right knee 08/11/2016   Severe obesity (BMI >= 40) (HCC) 05/09/2015   Hyperlipidemia 02/19/2014   Sinusitis, acute maxillary 02/09/2014   Infected laceration 02/09/2014   HTN (hypertension) 03/13/2011    PCP: Seabron Spates  REFERRING PROVIDER: Doneen Poisson  REFERRING DIAG: s/p Rt THA  THERAPY DIAG:  Muscle weakness (generalized)  Pain in right hip  Difficulty in walking, not elsewhere classified  Other symptoms and signs involving the musculoskeletal system  Rationale for Evaluation and Treatment: Rehabilitation  ONSET DATE: 10/06/22  SUBJECTIVE:   SUBJECTIVE STATEMENT: Patient reports today is the first day he did not take prescription pain meds. He returns to MD tomorrow. He is able to get more comfortable at night but still has trouble sleeping longer than an hour or 2.  EVAL: Pt is s/p Rt THA 10/06/22. He stayed in the hospital overnight and was released the next day. Pt states the most difficulty he is having right now is finding a comfortable position to sleep. He has been using ice for pain control and uses pain meds at night. He has been performing gait with RW for about a block a few times a day. He has been able to shower independently. He is still not driving.  PERTINENT HISTORY: Arthritis, HTN  PAIN:  Are you having pain? Yes: NPRS scale: 4-5/10 currenlty, up to 8/10 at times/10 Pain location: Rt hip Pain description: sore, burning Aggravating factors: certain positions Relieving factors: ice, meds  PRECAUTIONS: None  RED FLAGS: None   WEIGHT BEARING RESTRICTIONS: No  FALLS:  Has patient fallen in last 6 months? 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  PLOF: Independent  PATIENT GOALS: return to work mid October, be able to sleep, get up into his truck  NEXT MD VISIT: 10/19/22  OBJECTIVE:    PATIENT SURVEYS:  FOTO 49   PALPATION: TTP throughout Rt hip  LOWER EXTREMITY ROM:  Active ROM Right eval Left eval  Hip flexion 48   Hip extension -18   Hip abduction 0   Hip adduction    Hip internal rotation    Hip external rotation    Knee flexion    Knee extension    Ankle dorsiflexion    Ankle plantarflexion    Ankle inversion    Ankle eversion     (Blank rows = not tested)  LOWER EXTREMITY MMT:  MMT Right eval Left eval  Hip flexion 3-   Hip extension 3-   Hip abduction 3-   Hip adduction    Hip internal rotation    Hip external rotation    Knee flexion 3   Knee extension 3   Ankle dorsiflexion    Ankle plantarflexion    Ankle inversion    Ankle eversion     (Blank rows = not tested)   GAIT: Distance walked: 110' Assistive device utilized: Environmental consultant - 2 wheeled Level of assistance: Complete Independence Comments: decreased cadence, decreased step and stride length   TODAY'S TREATMENT:    [facility] Adult PT Treatment:                                                DATE: 10/18/22 Therapeutic Exercise: Nustep L5 x 5 min Rt LE hip abd and ext on slider 2 x 10 Standing heel raises x 20 Standing march 2 x 10 Seated LAQ 10 x 5 sec hold Sit <> stand 2 x 5 (seated on blue foam on mat table)  Therapeutic Activity: Gait forward and backwards focus on equal wt bearing Gait sideways for balance and hip strength   [facility] Adult PT Treatment:                                                DATE: 10/16/2022 Therapeutic Exercise: Standing: Hip abd & ext (L) x10 each Heel raises x15 Seated: Knee flexion (R) with slider x10 M/L (small range) with slider x10 LAQ (R) 10x5" Standing marching  Seated self-massage with myofascial ball and roller stick Therapeutic Activity: Adjusting SPC for proper height -->  gait training with SPC Lateral weight shifting on R LE Staggered stance fwd weight shifting on R LE Discussion on awareness of functional hip flexion and equal weigh bearing  DATE: 10/11/22 Hip flex, ext, abd slides in standing for AROM - in tolerable range Seated LAQ, HS curl    PATIENT EDUCATION:  Education details: PT POC and goals, HEP Person educated: Patient Education method: Explanation, Demonstration, and Handouts Education comprehension: verbalized understanding and returned demonstration  HOME EXERCISE PROGRAM: Access Code: [email or web address] URL: [email or web address] Date: 10/11/2022 Prepared by: Reggy Eye  Exercises - Seated Long Arc Quad  - 1 x daily - 7 x weekly - 3 sets - 10 reps - Seated Knee Flexion Slide  - 1 x daily - 7 x weekly - 3 sets - 10 reps - Standing Hip Abduction  - 1 x daily - 7 x weekly - 3 sets - 10 reps - Standing Hip Extension  - 1 x daily - 7 x weekly - 3 sets - 10 reps - Standing Hip Flexion with Counter Support  - 1 x daily - 7 x weekly - 3 sets - 10 reps  ASSESSMENT:  CLINICAL IMPRESSION: Pt continues to progress well. Some increased soreness with sit to stand, resolved with frequent rest breaks. Pt able to increase reps of all exercises with no increase in symptoms   EVAL: Patient is a 47 y.o. male who was seen today for physical therapy evaluation and treatment for s/p Rt THA. Pt presents with impaired gait and balance, decreased strength and ROM, decreased functional activity tolerance and will benefit from skilled PT to address deficits and improve functional mobility and independence.   OBJECTIVE IMPAIRMENTS: decreased activity tolerance, decreased balance, decreased mobility, difficulty walking, decreased ROM, decreased strength, and pain.   ACTIVITY LIMITATIONS: standing, squatting, stairs, transfers,  bed mobility, and locomotion level  PARTICIPATION LIMITATIONS: meal prep, cleaning, driving, community activity, occupation, and yard work  PERSONAL FACTORS: Profession are also affecting patient's functional outcome.   REHAB POTENTIAL: Good  CLINICAL DECISION MAKING: Stable/uncomplicated  EVALUATION COMPLEXITY: Low   GOALS: Goals reviewed with patient? Yes  SHORT TERM GOALS: Target date: 11/08/2022  Pt will be independent with initial HEP Baseline: Goal status: INITIAL  2.  Pt will improve Rt hip strength to 3+/5 to improve gait and bed mobility Baseline:  Goal status: INITIAL   LONG TERM GOALS: Target date: 12/06/2022  Pt will be independent in advanced HEP Baseline:  Goal status: INITIAL  2.  Pt will improve FOTO to >= 72 to demo improved functional mobility Baseline:  Goal status: INITIAL  3.  Pt will improve Rt hip strength to 4+/5 to return to work duties Baseline:  Goal status: INITIAL  4.  Pt will perform gait with normalized gait pattern without AD x 150' to return to work Baseline:  Goal status: INITIAL  5.  Pt will perform supine <> sit and rolling in bed with pain <= 2/10 Baseline:  Goal status: INITIAL    PLAN:  PT FREQUENCY: 2x/week  PT DURATION: 8 weeks  PLANNED INTERVENTIONS: Therapeutic exercises, Therapeutic activity, Neuromuscular re-education, Balance training, Gait training, Patient/Family education, Self Care, Joint mobilization, Aquatic Therapy, Dry Needling, Electrical stimulation, Cryotherapy, Moist heat, Taping, Ionotophoresis 4mg /ml Dexamethasone, Manual therapy, and Re-evaluation  PLAN FOR NEXT SESSION: Progress hip and core strength, gait and stair training   Marwan Lipe, PT 10/18/2022, 3:02 PM

## 2022-10-19 ENCOUNTER — Ambulatory Visit (INDEPENDENT_AMBULATORY_CARE_PROVIDER_SITE_OTHER): Payer: 59 | Admitting: Orthopaedic Surgery

## 2022-10-19 ENCOUNTER — Encounter: Payer: Self-pay | Admitting: Orthopaedic Surgery

## 2022-10-19 DIAGNOSIS — Z96641 Presence of right artificial hip joint: Secondary | ICD-10-CM

## 2022-10-19 MED ORDER — OXYCODONE HCL 5 MG PO TABS: 5.0000 mg | ORAL_TABLET | Freq: Four times a day (QID) | ORAL | 0 refills | Status: DC | PRN

## 2022-10-19 NOTE — Progress Notes (Signed)
The patient is here for his first postoperative visit status post a right total hip replacement.  He is only 47 years old and had debilitating arthritis involving his right hip.  There were no complications from the surgery.  He has been compliant with aspirin twice daily.  He was not on aspirin before this so I told him he could stop his aspirin.  His calf is soft.  His right hip incision looks good.  I did drain about 30 cc of a seroma from the soft tissue.  The staples are removed and Steri-Strips applied.  Overall he does look good.  He is ambulating with a cane.  He will continue to slowly increase his activities as comfort allows.  Will see him back in 4 weeks to see how he is doing overall but no x-rays are needed.

## 2022-10-23 ENCOUNTER — Ambulatory Visit: Payer: 59

## 2022-10-23 DIAGNOSIS — R262 Difficulty in walking, not elsewhere classified: Secondary | ICD-10-CM

## 2022-10-23 DIAGNOSIS — Z471 Aftercare following joint replacement surgery: Secondary | ICD-10-CM | POA: Diagnosis not present

## 2022-10-23 DIAGNOSIS — M25551 Pain in right hip: Secondary | ICD-10-CM

## 2022-10-23 DIAGNOSIS — R29898 Other symptoms and signs involving the musculoskeletal system: Secondary | ICD-10-CM

## 2022-10-23 DIAGNOSIS — M6281 Muscle weakness (generalized): Secondary | ICD-10-CM

## 2022-10-23 NOTE — Therapy (Signed)
OUTPATIENT PHYSICAL THERAPY LOWER EXTREMITY TREATMENT   Patient Name: Sean [name] MRN: [id number] DOB:[birth year], 47 y.o., male Today's Date: 10/23/2022  END OF SESSION:  PT End of Session - 10/23/22 1535     Visit Number 4    Number of Visits 16    Date for PT Re-Evaluation 12/06/22    Authorization Type [employer]    PT Start Time 1535    PT Stop Time 1615    PT Time Calculation (min) 40 min    Activity Tolerance Patient tolerated treatment well    Behavior During Therapy WFL for tasks assessed/performed              Past Medical History:  Diagnosis Date   Arthritis    Heartburn    Hyperlipidemia    Hypertension    Pre-diabetes    Sleep apnea    SOB (shortness of breath) on exertion    Swelling    feet and legs   Past Surgical History:  Procedure Laterality Date   NO PAST SURGERIES     TOTAL HIP ARTHROPLASTY Right 10/06/2022   Procedure: RIGHT TOTAL HIP ARTHROPLASTY ANTERIOR APPROACH;  Surgeon: Kathryne Hitch, MD;  Location: [facility];  Service: Orthopedics;  Laterality: Right;   Patient Active Problem List   Diagnosis Date Noted   Status post total replacement of right hip 10/06/2022   Low back pain 02/24/2021   Arthritis of right hip 01/16/2021   Bronchitis due to COVID-19 virus 11/01/2020   Hip pain 11/01/2020   Right thigh pain 05/13/2019   Uncontrolled type 2 diabetes mellitus with hyperglycemia (HCC) 04/10/2019   Hyperlipidemia associated with type 2 diabetes mellitus (HCC) 04/10/2019   Cutaneous skin tags 04/10/2019   Class 3 severe obesity with serious comorbidity and body mass index (BMI) of 50.0 to 59.9 in adult [facility]) 03/18/2018   Cellulitis of left lower extremity 09/07/2017   History of cellulitis 08/19/2017   Hypokalemia 08/19/2017   Lower extremity edema 08/19/2017   Depression 02/15/2017   Prediabetes 09/19/2016   Vitamin D deficiency 09/19/2016   Other fatigue 09/05/2016   Dyspnea on exertion 09/05/2016   Snoring 09/05/2016    Morbid obesity (HCC) 09/05/2016   Patellofemoral arthritis of right knee 08/11/2016   Severe obesity (BMI >= 40) (HCC) 05/09/2015   Hyperlipidemia 02/19/2014   Sinusitis, acute maxillary 02/09/2014   Infected laceration 02/09/2014   HTN (hypertension) 03/13/2011    PCP: Seabron Spates  REFERRING PROVIDER: Doneen Poisson  REFERRING DIAG: s/p Rt THA  THERAPY DIAG:  Muscle weakness (generalized)  Pain in right hip  Difficulty in walking, not elsewhere classified  Other symptoms and signs involving the musculoskeletal system  Rationale for Evaluation and Treatment: Rehabilitation  ONSET DATE: 10/06/22  SUBJECTIVE:   SUBJECTIVE STATEMENT: Patient received a good report from surgeon and he removed the staples and cleared him for driving. Patient states he is only taken pain medication at night, states he is still not getting very much sleep. Patient states hip is stiff and sore when he first stands up after sitting; states he gets a sharp pain above incision site and lower lateral thigh.   EVAL: Pt is s/p Rt THA 10/06/22. He stayed in the hospital overnight and was released the next day. Pt states the most difficulty he is having right now is finding a comfortable position to sleep. He has been using ice for pain control and uses pain meds at night. He has been performing gait with RW for  about a block a few times a day. He has been able to shower independently. He is still not driving.  PERTINENT HISTORY: Arthritis, HTN PAIN:  Are you having pain? Yes: NPRS scale: 4-5/10 currenlty, up to 8/10 at times/10 Pain location: Rt hip Pain description: sore, burning Aggravating factors: certain positions Relieving factors: ice, meds  PRECAUTIONS: None  RED FLAGS: None   WEIGHT BEARING RESTRICTIONS: No  FALLS:  Has patient fallen in last 6 months? 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  PLOF: Independent  PATIENT GOALS: return to work mid October, be able to sleep, get up into his truck  NEXT MD VISIT: 11/13/22  OBJECTIVE:    PATIENT SURVEYS:  FOTO 49   PALPATION: TTP throughout Rt hip  LOWER EXTREMITY ROM:  Active ROM Right eval Left eval  Hip flexion 48   Hip extension -18   Hip abduction 0   Hip adduction    Hip internal rotation    Hip external rotation    Knee flexion    Knee extension    Ankle dorsiflexion    Ankle plantarflexion    Ankle inversion    Ankle eversion     (Blank rows = not tested)  LOWER EXTREMITY MMT:  MMT Right eval Left eval  Hip flexion 3-   Hip extension 3-   Hip abduction 3-   Hip adduction    Hip internal rotation    Hip external rotation    Knee flexion 3   Knee extension 3   Ankle dorsiflexion    Ankle plantarflexion    Ankle inversion    Ankle eversion     (Blank rows = not tested)   GAIT: Distance walked: 110' Assistive device utilized: Environmental consultant - 2 wheeled Level of assistance: Complete Independence Comments: decreased cadence, decreased step and stride length   TODAY'S TREATMENT:    [facility] Adult PT Treatment:                                                DATE: 10/23/2022 Therapeutic Exercise: NuStep L6 x Standing: Hip abd & ext with slider (R moving) Heel raises x20 (R) high knee step over mid level yoga block (R) runner's lunge stretch Seated R SLR --> supine SLR (small range) x5 Seated knee flexion (R) with slider x12 (R) LAQ x12 Therapeutic Activity: Side stepping 15'x4 Zig zag stepping fwd/bkwd 15'x2 STS with focus on equal weight bearing b/w legs and hip hinge mechanics    [facility] Adult PT Treatment:                                                DATE: 10/18/22 Therapeutic Exercise: Nustep L5 x 5 min Rt LE hip abd and ext on slider  2 x 10 Standing heel raises x 20 Standing march 2 x 10 Seated LAQ 10 x 5 sec hold Sit <> stand 2 x 5 (seated on blue foam on mat table) Therapeutic Activity: Gait forward and backwards focus on equal  wt bearing Gait sideways for balance and hip strength   [facility] Adult PT Treatment:                                                DATE: 10/16/2022 Therapeutic Exercise: Standing: Hip abd & ext (L) x10 each Heel raises x15 Seated: Knee flexion (R) with slider x10 M/L (small range) with slider x10 LAQ (R) 10x5" Standing marching  Seated self-massage with myofascial ball and roller stick Therapeutic Activity: Adjusting SPC for proper height --> gait training with SPC Lateral weight shifting on R LE Staggered stance fwd weight shifting on R LE Discussion on awareness of functional hip flexion and equal weigh bearing                                                                                                             PATIENT EDUCATION:  Education details: PT POC and goals, HEP Person educated: Patient Education method: Explanation, Demonstration, and Handouts Education comprehension: verbalized understanding and returned demonstration  HOME EXERCISE PROGRAM: Access Code: [email or web address] URL: [email or web address] Date: 10/11/2022 Prepared by: Reggy Eye  Exercises - Seated Long Arc Quad  - 1 x daily - 7 x weekly - 3 sets - 10 reps - Seated Knee Flexion Slide  - 1 x daily - 7 x weekly - 3 sets - 10 reps - Standing Hip Abduction  - 1 x daily - 7 x weekly - 3 sets - 10 reps - Standing Hip Extension  - 1 x daily - 7 x weekly - 3 sets - 10 reps - Standing Hip Flexion with Counter Support  - 1 x daily - 7 x weekly - 3 sets - 10 reps  ASSESSMENT:  CLINICAL IMPRESSION: Functional knee flexion performed in standing to progress transfers in/out of vehicle. Cueing improved hip hinge mechanics and anterior weight shifting during sit to stand transfers.    EVAL: Patient is  a 47 y.o. male who was seen today for physical therapy evaluation and treatment for s/p Rt THA. Pt presents with impaired gait and balance, decreased strength and ROM, decreased functional activity tolerance and will benefit from skilled PT to address deficits and improve functional mobility and independence.   OBJECTIVE IMPAIRMENTS: decreased activity tolerance, decreased balance, decreased mobility, difficulty walking, decreased ROM, decreased strength, and pain.   ACTIVITY LIMITATIONS: standing, squatting, stairs, transfers, bed mobility, and locomotion level  PARTICIPATION LIMITATIONS: meal prep, cleaning, driving, community activity, occupation, and yard work  PERSONAL FACTORS: Profession are also affecting patient's functional outcome.   REHAB POTENTIAL: Good  CLINICAL DECISION MAKING: Stable/uncomplicated  EVALUATION COMPLEXITY: Low   GOALS: Goals reviewed with patient? Yes  SHORT TERM GOALS: Target date: 11/08/2022  Pt will be independent with initial HEP Baseline: Goal status: INITIAL  2.  Pt will improve Rt hip strength to 3+/5 to improve gait and bed mobility Baseline:  Goal status: INITIAL   LONG TERM  GOALS: Target date: 12/06/2022  Pt will be independent in advanced HEP Baseline:  Goal status: INITIAL  2.  Pt will improve FOTO to >= 72 to demo improved functional mobility Baseline: 59 Goal status: IN PROGRESS  3.  Pt will improve Rt hip strength to 4+/5 to return to work duties Baseline:  Goal status: INITIAL  4.  Pt will perform gait with normalized gait pattern without AD x 150' to return to work Baseline:  Goal status: INITIAL  5.  Pt will perform supine <> sit and rolling in bed with pain <= 2/10 Baseline:  Goal status: INITIAL    PLAN:  PT FREQUENCY: 2x/week  PT DURATION: 8 weeks  PLANNED INTERVENTIONS: Therapeutic exercises, Therapeutic activity, Neuromuscular re-education, Balance training, Gait training, Patient/Family education, Self  Care, Joint mobilization, Aquatic Therapy, Dry Needling, Electrical stimulation, Cryotherapy, Moist heat, Taping, Ionotophoresis 4mg /ml Dexamethasone, Manual therapy, and Re-evaluation  PLAN FOR NEXT SESSION: Progress hip and core strength, gait and stair training   Sanjuana Mae, PTA 10/23/2022, 4:16 PM

## 2022-10-25 ENCOUNTER — Ambulatory Visit: Payer: 59

## 2022-10-25 DIAGNOSIS — R262 Difficulty in walking, not elsewhere classified: Secondary | ICD-10-CM

## 2022-10-25 DIAGNOSIS — M6281 Muscle weakness (generalized): Secondary | ICD-10-CM

## 2022-10-25 DIAGNOSIS — M25551 Pain in right hip: Secondary | ICD-10-CM

## 2022-10-25 DIAGNOSIS — Z471 Aftercare following joint replacement surgery: Secondary | ICD-10-CM | POA: Diagnosis not present

## 2022-10-25 DIAGNOSIS — R29898 Other symptoms and signs involving the musculoskeletal system: Secondary | ICD-10-CM

## 2022-10-25 NOTE — Therapy (Signed)
OUTPATIENT PHYSICAL THERAPY LOWER EXTREMITY TREATMENT   Patient Name: Sean [name] MRN: [id number] DOB:1975/11/08, 47 y.o., male Today's Date: 10/25/2022  END OF SESSION:  PT End of Session - 10/25/22 1442     Visit Number 5    Number of Visits 16    Date for PT Re-Evaluation 12/06/22    Authorization Type [employer]    PT Start Time 1443    PT Stop Time 1526    PT Time Calculation (min) 43 min    Activity Tolerance Patient tolerated treatment well    Behavior During Therapy WFL for tasks assessed/performed              Past Medical History:  Diagnosis Date   Arthritis    Heartburn    Hyperlipidemia    Hypertension    Pre-diabetes    Sleep apnea    SOB (shortness of breath) on exertion    Swelling    feet and legs   Past Surgical History:  Procedure Laterality Date   NO PAST SURGERIES     TOTAL HIP ARTHROPLASTY Right 10/06/2022   Procedure: RIGHT TOTAL HIP ARTHROPLASTY ANTERIOR APPROACH;  Surgeon: Kathryne Hitch, MD;  Location: [facility];  Service: Orthopedics;  Laterality: Right;   Patient Active Problem List   Diagnosis Date Noted   Status post total replacement of right hip 10/06/2022   Low back pain 02/24/2021   Arthritis of right hip 01/16/2021   Bronchitis due to COVID-19 virus 11/01/2020   Hip pain 11/01/2020   Right thigh pain 05/13/2019   Uncontrolled type 2 diabetes mellitus with hyperglycemia (HCC) 04/10/2019   Hyperlipidemia associated with type 2 diabetes mellitus (HCC) 04/10/2019   Cutaneous skin tags 04/10/2019   Class 3 severe obesity with serious comorbidity and body mass index (BMI) of 50.0 to 59.9 in adult [facility]) 03/18/2018   Cellulitis of left lower extremity 09/07/2017   History of cellulitis 08/19/2017   Hypokalemia 08/19/2017   Lower extremity edema 08/19/2017   Depression 02/15/2017   Prediabetes 09/19/2016   Vitamin D deficiency 09/19/2016   Other fatigue 09/05/2016   Dyspnea on exertion 09/05/2016   Snoring 09/05/2016    Morbid obesity (HCC) 09/05/2016   Patellofemoral arthritis of right knee 08/11/2016   Severe obesity (BMI >= 40) (HCC) 05/09/2015   Hyperlipidemia 02/19/2014   Sinusitis, acute maxillary 02/09/2014   Infected laceration 02/09/2014   HTN (hypertension) 03/13/2011    PCP: Seabron Spates  REFERRING PROVIDER: Doneen Poisson  REFERRING DIAG: s/p Rt THA  THERAPY DIAG:  Muscle weakness (generalized)  Pain in right hip  Difficulty in walking, not elsewhere classified  Other symptoms and signs involving the musculoskeletal system  Rationale for Evaluation and Treatment: Rehabilitation  ONSET DATE: 10/06/22  SUBJECTIVE:   SUBJECTIVE STATEMENT: Patient states he was tired after last session but not sore. Patient states he uses a step stool to get in his truck. Patient states he is sleeping better but continues to be woken up by nerve pain.   PERTINENT HISTORY: Arthritis, HTN PAIN:  Are you having pain? Yes: NPRS scale: 4-5/10 currenlty, up to 8/10 at times/10 Pain location: Rt hip Pain description: sore, burning Aggravating factors: certain positions Relieving factors: ice, meds  PRECAUTIONS: None  RED FLAGS: None   WEIGHT BEARING RESTRICTIONS: No  FALLS:  Has patient fallen in last 6 months? 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  PLOF: Independent  PATIENT GOALS: return to work mid October, be able to sleep, get up into his truck  NEXT MD VISIT: 11/13/22  OBJECTIVE:    PATIENT SURVEYS:  FOTO 49   PALPATION: TTP throughout Rt hip  LOWER EXTREMITY ROM:  Active ROM Right eval Left eval  Hip flexion 48   Hip extension -18   Hip abduction 0   Hip adduction    Hip internal rotation    Hip external rotation    Knee  flexion    Knee extension    Ankle dorsiflexion    Ankle plantarflexion    Ankle inversion    Ankle eversion     (Blank rows = not tested)  LOWER EXTREMITY MMT:  MMT Right eval Left eval  Hip flexion 3-   Hip extension 3-   Hip abduction 3-   Hip adduction    Hip internal rotation    Hip external rotation    Knee flexion 3   Knee extension 3   Ankle dorsiflexion    Ankle plantarflexion    Ankle inversion    Ankle eversion     (Blank rows = not tested)   GAIT: Distance walked: 110' Assistive device utilized: Environmental consultant - 2 wheeled Level of assistance: Complete Independence Comments: decreased cadence, decreased step and stride length   TODAY'S TREATMENT:    [facility] Adult PT Treatment:                                                DATE: 10/25/2022 Therapeutic Exercise: NuStep L6 x Counter: Resisted side stepping RTB (ankles) Fwd & bkwd zig-zag stepping RTB (ankles) Heel raises 2x10 Squats with seat tap on pillow (corner of table) 2x10 Supine SLR (R) x6, x4, x2 Seated on airex --> hip flexor stretch x30" Therapeutic Activity: Gait training with focus on functional hip/knee flexion during swing phase    [facility] Adult PT Treatment:                                                DATE: 10/23/2022 Therapeutic Exercise: NuStep L6 x Standing: Hip abd & ext with slider (R moving) Heel raises x20 (R) high knee step over mid level yoga block (R) runner's lunge stretch Seated R SLR --> supine SLR (small range) x5 Seated knee flexion (R) with slider x12 (R) LAQ x12 Therapeutic Activity: Side stepping 15'x4 Zig zag stepping fwd/bkwd 15'x2 STS with focus on equal weight bearing b/w legs and hip hinge mechanics    [facility] Adult PT Treatment:                                                DATE: 10/18/22 Therapeutic Exercise: Nustep L5 x 5 min Rt LE hip abd and ext on slider 2 x 10 Standing heel raises x 20 Standing march 2 x 10 Seated LAQ 10 x 5 sec hold Sit <>  stand 2 x 5 (seated on blue foam on  mat table) Therapeutic Activity: Gait forward and backwards focus on equal wt bearing Gait sideways for balance and hip strength                                                                                                     PATIENT EDUCATION:  Education details: PT POC and goals, HEP Person educated: Patient Education method: Explanation, Demonstration, and Handouts Education comprehension: verbalized understanding and returned demonstration  HOME EXERCISE PROGRAM: Access Code: [email or web address] URL: [email or web address] Date: 10/25/2022 Prepared by: Carlynn Herald  Exercises - Seated Long Arc Quad  - 1 x daily - 7 x weekly - 3 sets - 10 reps - Small Range Straight Leg Raise  - 1 x daily - 7 x weekly - 3 sets - 10 reps - 3-5 sec hold - Seated Knee Flexion Slide  - 1 x daily - 7 x weekly - 3 sets - 10 reps - Standing Hip Flexion with Counter Support  - 1 x daily - 7 x weekly - 3 sets - 10 reps - Standing Hip Extension with Counter Support  - 1 x daily - 7 x weekly - 3 sets - 10 reps - Standing Hip Abduction with Counter Support  - 1 x daily - 7 x weekly - 3 sets - 10 reps - Standing 3-Way Leg Reach with Resistance at Ankles and Counter Support  - 1 x daily - 7 x weekly - 3 sets - 10 reps - Squat with Chair Touch  - 1 x daily - 7 x weekly - 3 sets - 10 reps  ASSESSMENT:  CLINICAL IMPRESSION: Functional LE strengthening progressed with standing squats. Patient continues to demonstrate moderate quad fatigue (R LE) with small range straight leg raises. Cueing increased knee flexion during gait training improved swing phase mechanics (R) and decreased antalgic gait pattern.   OBJECTIVE IMPAIRMENTS: decreased activity tolerance, decreased balance, decreased mobility, difficulty walking, decreased ROM, decreased strength, and pain.   ACTIVITY LIMITATIONS: standing, squatting, stairs, transfers, bed mobility, and locomotion  level  PARTICIPATION LIMITATIONS: meal prep, cleaning, driving, community activity, occupation, and yard work  PERSONAL FACTORS: Profession are also affecting patient's functional outcome.   REHAB POTENTIAL: Good  CLINICAL DECISION MAKING: Stable/uncomplicated  EVALUATION COMPLEXITY: Low   GOALS: Goals reviewed with patient? Yes  SHORT TERM GOALS: Target date: 11/08/2022  Pt will be independent with initial HEP Baseline: Goal status: INITIAL  2.  Pt will improve Rt hip strength to 3+/5 to improve gait and bed mobility Baseline:  Goal status: INITIAL   LONG TERM GOALS: Target date: 12/06/2022  Pt will be independent in advanced HEP Baseline:  Goal status: INITIAL  2.  Pt will improve FOTO to >= 72 to demo improved functional mobility Baseline: 59 Goal status: IN PROGRESS  3.  Pt will improve Rt hip strength to 4+/5 to return to work duties Baseline:  Goal status: INITIAL  4.  Pt will perform gait with normalized gait pattern without AD x 150' to return to work Baseline:  Goal status: INITIAL  5.  Pt will perform supine <> sit  and rolling in bed with pain <= 2/10 Baseline:  Goal status: INITIAL    PLAN:  PT FREQUENCY: 2x/week  PT DURATION: 8 weeks  PLANNED INTERVENTIONS: Therapeutic exercises, Therapeutic activity, Neuromuscular re-education, Balance training, Gait training, Patient/Family education, Self Care, Joint mobilization, Aquatic Therapy, Dry Needling, Electrical stimulation, Cryotherapy, Moist heat, Taping, Ionotophoresis 4mg /ml Dexamethasone, Manual therapy, and Re-evaluation  PLAN FOR NEXT SESSION: Progress hip and core strength, gait and stair training   Sanjuana Mae, PTA 10/25/2022, 3:32 PM

## 2022-10-30 ENCOUNTER — Ambulatory Visit: Payer: 59

## 2022-10-30 DIAGNOSIS — R262 Difficulty in walking, not elsewhere classified: Secondary | ICD-10-CM

## 2022-10-30 DIAGNOSIS — Z471 Aftercare following joint replacement surgery: Secondary | ICD-10-CM | POA: Diagnosis not present

## 2022-10-30 DIAGNOSIS — M6281 Muscle weakness (generalized): Secondary | ICD-10-CM

## 2022-10-30 DIAGNOSIS — R29898 Other symptoms and signs involving the musculoskeletal system: Secondary | ICD-10-CM

## 2022-10-30 DIAGNOSIS — M25551 Pain in right hip: Secondary | ICD-10-CM

## 2022-10-30 NOTE — Therapy (Signed)
OUTPATIENT PHYSICAL THERAPY LOWER EXTREMITY TREATMENT   Patient Name: Sean [name] MRN: [id number] DOB:1975-11-26, 47 y.o., male Today's Date: 10/30/2022  END OF SESSION:  PT End of Session - 10/30/22 1537     Visit Number 6    Number of Visits 16    Date for PT Re-Evaluation 12/06/22    Authorization Type [employer]    PT Start Time 1536    PT Stop Time 1620    PT Time Calculation (min) 44 min    Activity Tolerance Patient tolerated treatment well    Behavior During Therapy WFL for tasks assessed/performed              Past Medical History:  Diagnosis Date   Arthritis    Heartburn    Hyperlipidemia    Hypertension    Pre-diabetes    Sleep apnea    SOB (shortness of breath) on exertion    Swelling    feet and legs   Past Surgical History:  Procedure Laterality Date   NO PAST SURGERIES     TOTAL HIP ARTHROPLASTY Right 10/06/2022   Procedure: RIGHT TOTAL HIP ARTHROPLASTY ANTERIOR APPROACH;  Surgeon: Kathryne Hitch, MD;  Location: [facility];  Service: Orthopedics;  Laterality: Right;   Patient Active Problem List   Diagnosis Date Noted   Status post total replacement of right hip 10/06/2022   Low back pain 02/24/2021   Arthritis of right hip 01/16/2021   Bronchitis due to COVID-19 virus 11/01/2020   Hip pain 11/01/2020   Right thigh pain 05/13/2019   Uncontrolled type 2 diabetes mellitus with hyperglycemia (HCC) 04/10/2019   Hyperlipidemia associated with type 2 diabetes mellitus (HCC) 04/10/2019   Cutaneous skin tags 04/10/2019   Class 3 severe obesity with serious comorbidity and body mass index (BMI) of 50.0 to 59.9 in adult [facility]) 03/18/2018   Cellulitis of left lower extremity 09/07/2017   History of cellulitis 08/19/2017   Hypokalemia 08/19/2017   Lower extremity edema 08/19/2017   Depression 02/15/2017   Prediabetes 09/19/2016   Vitamin D deficiency 09/19/2016   Other fatigue 09/05/2016   Dyspnea on exertion 09/05/2016   Snoring 09/05/2016    Morbid obesity (HCC) 09/05/2016   Patellofemoral arthritis of right knee 08/11/2016   Severe obesity (BMI >= 40) (HCC) 05/09/2015   Hyperlipidemia 02/19/2014   Sinusitis, acute maxillary 02/09/2014   Infected laceration 02/09/2014   HTN (hypertension) 03/13/2011    PCP: Seabron Spates  REFERRING PROVIDER: Doneen Poisson  REFERRING DIAG: s/p Rt THA  THERAPY DIAG:  Muscle weakness (generalized)  Pain in right hip  Difficulty in walking, not elsewhere classified  Other symptoms and signs involving the musculoskeletal system  Rationale for Evaluation and Treatment: Rehabilitation  ONSET DATE: 10/06/22  SUBJECTIVE:   SUBJECTIVE STATEMENT: Patient reports he continues to have numbness along incision site but over all less pain.   PERTINENT HISTORY: Arthritis, HTN PAIN:  Are you having pain? Yes: NPRS scale: 4-5/10 currenlty, up to 8/10 at times/10 Pain location: Rt hip Pain description: sore, burning Aggravating factors: certain positions Relieving factors: ice, meds  PRECAUTIONS: None  RED FLAGS: None   WEIGHT BEARING RESTRICTIONS: No  FALLS:  Has patient fallen in last 6 months? 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  PLOF:  Independent  PATIENT GOALS: return to work mid October, be able to sleep, get up into his truck  NEXT MD VISIT: 11/13/22  OBJECTIVE:    PATIENT SURVEYS:  FOTO 49   PALPATION: TTP throughout Rt hip  LOWER EXTREMITY ROM:  Active ROM Right eval Left eval  Hip flexion 48   Hip extension -18   Hip abduction 0   Hip adduction    Hip internal rotation    Hip external rotation    Knee flexion    Knee extension    Ankle dorsiflexion    Ankle plantarflexion    Ankle inversion    Ankle  eversion     (Blank rows = not tested)  LOWER EXTREMITY MMT:  MMT Right eval Left eval  Hip flexion 3-   Hip extension 3-   Hip abduction 3-   Hip adduction    Hip internal rotation    Hip external rotation    Knee flexion 3   Knee extension 3   Ankle dorsiflexion    Ankle plantarflexion    Ankle inversion    Ankle eversion     (Blank rows = not tested)   GAIT: Distance walked: 110' Assistive device utilized: Environmental consultant - 2 wheeled Level of assistance: Complete Independence Comments: decreased cadence, decreased step and stride length   TODAY'S TREATMENT:    [facility] Adult PT Treatment:                                                DATE: 10/30/2022 Therapeutic Exercise: Walking with focus on functional hip/knee flexion NuStep L6 x Standing: (R) fwd/bkwd step over block to fatigue  (R) M/L step over block to fatigue Side stepping GTB crossed at ankles Fwd & bkwd zig-zag stepping GTB crossed at ankles STS + pillow seat tap 3x10 Seated (R) hip flexor stretch Standing resisted (R) hip ext YTB 2x10 6" stepper: step ups front & lateral (alt stepping pattern) x1 min each Butterfly adductor stretch with support form black bolsters x1 min    [facility] Adult PT Treatment:                                                DATE: 10/25/2022 Therapeutic Exercise: NuStep L6 x Counter: Resisted side stepping RTB (ankles) Fwd & bkwd zig-zag stepping RTB (ankles) Heel raises 2x10 Squats with seat tap on pillow (corner of table) 2x10 Supine SLR (R) x6, x4, x2 Seated on airex --> hip flexor stretch x30" Therapeutic Activity: Gait training with focus on functional hip/knee flexion during swing phase    [facility] Adult PT Treatment:                                                DATE: 10/23/2022 Therapeutic Exercise: NuStep L6 x Standing: Hip abd & ext with slider (R moving) Heel raises x20 (R) high knee step over mid level yoga block (R) runner's lunge stretch Seated R SLR  --> supine SLR (small range) x5 Seated knee flexion (R) with slider x12 (R) LAQ x12 Therapeutic Activity: Side stepping 15'x4 Zig zag  stepping fwd/bkwd 15'x2 STS with focus on equal weight bearing b/w legs and hip hinge mechanics           PATIENT EDUCATION:  Education details: PT POC and goals, HEP Person educated: Patient Education method: Explanation, Demonstration, and Handouts Education comprehension: verbalized understanding and returned demonstration  HOME EXERCISE PROGRAM: Access Code: [email or web address] URL: [email or web address] Date: 10/25/2022 Prepared by: Carlynn Herald  Exercises - Seated Long Arc Quad  - 1 x daily - 7 x weekly - 3 sets - 10 reps - Small Range Straight Leg Raise  - 1 x daily - 7 x weekly - 3 sets - 10 reps - 3-5 sec hold - Seated Knee Flexion Slide  - 1 x daily - 7 x weekly - 3 sets - 10 reps - Standing Hip Flexion with Counter Support  - 1 x daily - 7 x weekly - 3 sets - 10 reps - Standing Hip Extension with Counter Support  - 1 x daily - 7 x weekly - 3 sets - 10 reps - Standing Hip Abduction with Counter Support  - 1 x daily - 7 x weekly - 3 sets - 10 reps - Standing 3-Way Leg Reach with Resistance at Ankles and Counter Support  - 1 x daily - 7 x weekly - 3 sets - 10 reps - Squat with Chair Touch  - 1 x daily - 7 x weekly - 3 sets - 10 reps  ASSESSMENT:  CLINICAL IMPRESSION: Functional hip strengthening continued with added front and lateral step ups, focusing on repetitive patterning and weight shifting. Increased groin tightness reported after performing lateral step ups; discomfort alleviated with supine adductor stretch. Cueing improved bed mobility; patient able to complete with no pain.    OBJECTIVE IMPAIRMENTS: decreased activity tolerance, decreased balance, decreased mobility, difficulty walking, decreased ROM, decreased strength, and pain.   ACTIVITY LIMITATIONS: standing, squatting, stairs, transfers, bed mobility, and locomotion  level  PARTICIPATION LIMITATIONS: meal prep, cleaning, driving, community activity, occupation, and yard work  PERSONAL FACTORS: Profession are also affecting patient's functional outcome.   REHAB POTENTIAL: Good  CLINICAL DECISION MAKING: Stable/uncomplicated  EVALUATION COMPLEXITY: Low   GOALS: Goals reviewed with patient? Yes  SHORT TERM GOALS: Target date: 11/08/2022  Pt will be independent with initial HEP Baseline: Goal status: INITIAL  2.  Pt will improve Rt hip strength to 3+/5 to improve gait and bed mobility Baseline:  Goal status: INITIAL   LONG TERM GOALS: Target date: 12/06/2022  Pt will be independent in advanced HEP Baseline:  Goal status: INITIAL  2.  Pt will improve FOTO to >= 72 to demo improved functional mobility Baseline: 59 Goal status: IN PROGRESS  3.  Pt will improve Rt hip strength to 4+/5 to return to work duties Baseline:  Goal status: INITIAL  4.  Pt will perform gait with normalized gait pattern without AD x 150' to return to work Baseline:  Goal status: INITIAL  5.  Pt will perform supine <> sit and rolling in bed with pain <= 2/10 Baseline: no pain Goal status: MET    PLAN:  PT FREQUENCY: 2x/week  PT DURATION: 8 weeks  PLANNED INTERVENTIONS: Therapeutic exercises, Therapeutic activity, Neuromuscular re-education, Balance training, Gait training, Patient/Family education, Self Care, Joint mobilization, Aquatic Therapy, Dry Needling, Electrical stimulation, Cryotherapy, Moist heat, Taping, Ionotophoresis 4mg /ml Dexamethasone, Manual therapy, and Re-evaluation  PLAN FOR NEXT SESSION: Progress hip and core strength, gait and stair training   Sanjuana Mae, PTA 10/30/2022, 4:22 PM

## 2022-11-01 ENCOUNTER — Encounter: Payer: Self-pay | Admitting: Physical Therapy

## 2022-11-01 ENCOUNTER — Ambulatory Visit: Payer: 59 | Admitting: Physical Therapy

## 2022-11-01 DIAGNOSIS — M6281 Muscle weakness (generalized): Secondary | ICD-10-CM

## 2022-11-01 DIAGNOSIS — R29898 Other symptoms and signs involving the musculoskeletal system: Secondary | ICD-10-CM

## 2022-11-01 DIAGNOSIS — Z471 Aftercare following joint replacement surgery: Secondary | ICD-10-CM | POA: Diagnosis not present

## 2022-11-01 DIAGNOSIS — M25551 Pain in right hip: Secondary | ICD-10-CM

## 2022-11-01 DIAGNOSIS — R262 Difficulty in walking, not elsewhere classified: Secondary | ICD-10-CM

## 2022-11-01 NOTE — Therapy (Signed)
OUTPATIENT PHYSICAL THERAPY LOWER EXTREMITY TREATMENT   Patient Name: Sean [name] MRN: [id number] DOB:1975-12-16, 47 y.o., male Today's Date: 11/01/2022  END OF SESSION:  PT End of Session - 11/01/22 1527     Visit Number 7    Number of Visits 16    Date for PT Re-Evaluation 12/06/22    Authorization Type [employer]    PT Start Time 1445    PT Stop Time 1527    PT Time Calculation (min) 42 min    Activity Tolerance Patient tolerated treatment well    Behavior During Therapy WFL for tasks assessed/performed               Past Medical History:  Diagnosis Date   Arthritis    Heartburn    Hyperlipidemia    Hypertension    Pre-diabetes    Sleep apnea    SOB (shortness of breath) on exertion    Swelling    feet and legs   Past Surgical History:  Procedure Laterality Date   NO PAST SURGERIES     TOTAL HIP ARTHROPLASTY Right 10/06/2022   Procedure: RIGHT TOTAL HIP ARTHROPLASTY ANTERIOR APPROACH;  Surgeon: Kathryne Hitch, MD;  Location: [facility];  Service: Orthopedics;  Laterality: Right;   Patient Active Problem List   Diagnosis Date Noted   Status post total replacement of right hip 10/06/2022   Low back pain 02/24/2021   Arthritis of right hip 01/16/2021   Bronchitis due to COVID-19 virus 11/01/2020   Hip pain 11/01/2020   Right thigh pain 05/13/2019   Uncontrolled type 2 diabetes mellitus with hyperglycemia (HCC) 04/10/2019   Hyperlipidemia associated with type 2 diabetes mellitus (HCC) 04/10/2019   Cutaneous skin tags 04/10/2019   Class 3 severe obesity with serious comorbidity and body mass index (BMI) of 50.0 to 59.9 in adult [facility]) 03/18/2018   Cellulitis of left lower extremity 09/07/2017   History of cellulitis 08/19/2017   Hypokalemia 08/19/2017   Lower extremity edema 08/19/2017   Depression 02/15/2017   Prediabetes 09/19/2016   Vitamin D deficiency 09/19/2016   Other fatigue 09/05/2016   Dyspnea on exertion 09/05/2016   Snoring 09/05/2016    Morbid obesity (HCC) 09/05/2016   Patellofemoral arthritis of right knee 08/11/2016   Severe obesity (BMI >= 40) (HCC) 05/09/2015   Hyperlipidemia 02/19/2014   Sinusitis, acute maxillary 02/09/2014   Infected laceration 02/09/2014   HTN (hypertension) 03/13/2011    PCP: Seabron Spates  REFERRING PROVIDER: Doneen Poisson  REFERRING DIAG: s/p Rt THA  THERAPY DIAG:  Muscle weakness (generalized)  Pain in right hip  Difficulty in walking, not elsewhere classified  Other symptoms and signs involving the musculoskeletal system  Rationale for Evaluation and Treatment: Rehabilitation  ONSET DATE: 10/06/22  SUBJECTIVE:   SUBJECTIVE STATEMENT: Patient reports he was sore after last visit due to muscle fatigue but he is having less pain overall  PERTINENT HISTORY: Arthritis, HTN PAIN:  Are you having pain? Yes: NPRS scale: 3/10 currenlty, up to 8/10 at times/10 Pain location: Rt hip Pain description: sore, burning Aggravating factors: certain positions Relieving factors: ice, meds  PRECAUTIONS: None  RED FLAGS: None   WEIGHT BEARING RESTRICTIONS: No  FALLS:  Has patient fallen in last 6 months? 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  PLOF: Independent  PATIENT GOALS: return to work mid October, be able to sleep, get up into his truck  NEXT MD VISIT: 11/13/22  OBJECTIVE:    PATIENT SURVEYS:  FOTO 49   PALPATION: TTP throughout Rt hip  LOWER EXTREMITY ROM:  Active ROM Right eval Left eval  Hip flexion 48   Hip extension -18   Hip abduction 0   Hip adduction    Hip internal rotation    Hip external rotation    Knee flexion    Knee extension    Ankle dorsiflexion    Ankle plantarflexion    Ankle inversion     Ankle eversion     (Blank rows = not tested)  LOWER EXTREMITY MMT:  MMT Right eval Left eval Right 9/25  Hip flexion 3-  3  Hip extension 3-  3  Hip abduction 3-  3  Hip adduction     Hip internal rotation     Hip external rotation     Knee flexion 3    Knee extension 3    Ankle dorsiflexion     Ankle plantarflexion     Ankle inversion     Ankle eversion      (Blank rows = not tested)   GAIT: Distance walked: 110' Assistive device utilized: Environmental consultant - 2 wheeled Level of assistance: Complete Independence Comments: decreased cadence, decreased step and stride length   TODAY'S TREATMENT:    [facility] Adult PT Treatment:                                                DATE: 11/01/22 Therapeutic Exercise: Nustep L6 x 5 min Strength measurements (see above) Side stepping green TB crossed at ankles Fwd/Bkwd zig zag stepping with green TB crossed at ankles Sit <> stand seat tap 3 x 10 Seated hip flexor stretch Butterfly adductor stretch  Gait: Stair negotiation x 2 flights - pt continues to perform step to pattern, requires 1 UE support   [facility] Adult PT Treatment:                                                DATE: 10/30/2022 Therapeutic Exercise: Walking with focus on functional hip/knee flexion NuStep L6 x Standing: (R) fwd/bkwd step over block to fatigue  (R) M/L step over block to fatigue Side stepping GTB crossed at ankles Fwd & bkwd zig-zag stepping GTB crossed at ankles STS + pillow seat tap 3x10 Seated (R) hip flexor stretch Standing resisted (R) hip ext YTB 2x10 6" stepper: step ups front & lateral (alt stepping pattern) x1 min each Butterfly adductor stretch with support form black bolsters x1 min    [facility] Adult PT Treatment:                                                DATE: 10/25/2022 Therapeutic Exercise: NuStep L6 x Counter: Resisted side stepping RTB (ankles) Fwd & bkwd zig-zag stepping RTB (ankles) Heel raises 2x10 Squats with seat tap on  pillow (corner of table) 2x10 Supine SLR (R) x6, x4,  x2 Seated on airex --> hip flexor stretch x30" Therapeutic Activity: Gait training with focus on functional hip/knee flexion during swing phase           PATIENT EDUCATION:  Education details: PT POC and goals, HEP Person educated: Patient Education method: Explanation, Demonstration, and Handouts Education comprehension: verbalized understanding and returned demonstration  HOME EXERCISE PROGRAM: Access Code: [email or web address] URL: [email or web address] Date: 10/25/2022 Prepared by: Carlynn Herald  Exercises - Seated Long Arc Quad  - 1 x daily - 7 x weekly - 3 sets - 10 reps - Small Range Straight Leg Raise  - 1 x daily - 7 x weekly - 3 sets - 10 reps - 3-5 sec hold - Seated Knee Flexion Slide  - 1 x daily - 7 x weekly - 3 sets - 10 reps - Standing Hip Flexion with Counter Support  - 1 x daily - 7 x weekly - 3 sets - 10 reps - Standing Hip Extension with Counter Support  - 1 x daily - 7 x weekly - 3 sets - 10 reps - Standing Hip Abduction with Counter Support  - 1 x daily - 7 x weekly - 3 sets - 10 reps - Standing 3-Way Leg Reach with Resistance at Ankles and Counter Support  - 1 x daily - 7 x weekly - 3 sets - 10 reps - Squat with Chair Touch  - 1 x daily - 7 x weekly - 3 sets - 10 reps  ASSESSMENT:  CLINICAL IMPRESSION: Pt has improved LE strength. He continues with decreased endurance and continues to use step to pattern with stairs due to weakness and decreased balance. Discussed pt increasing time on his feet at home to build endurance for return to work. Pt will continue to benefit from strength and endurance training with goal of returning to work next month   OBJECTIVE IMPAIRMENTS: decreased activity tolerance, decreased balance, decreased mobility, difficulty walking, decreased ROM, decreased strength, and pain.     GOALS: Goals reviewed with patient? Yes  SHORT TERM GOALS: Target date: 11/08/2022  Pt will  be independent with initial HEP Baseline: Goal status: INITIAL  2.  Pt will improve Rt hip strength to 3+/5 to improve gait and bed mobility Baseline:  Goal status: INITIAL   LONG TERM GOALS: Target date: 12/06/2022  Pt will be independent in advanced HEP Baseline:  Goal status: INITIAL  2.  Pt will improve FOTO to >= 72 to demo improved functional mobility Baseline: 59 Goal status: IN PROGRESS  3.  Pt will improve Rt hip strength to 4+/5 to return to work duties Baseline:  Goal status: INITIAL  4.  Pt will perform gait with normalized gait pattern without AD x 150' to return to work Baseline:  Goal status: INITIAL  5.  Pt will perform supine <> sit and rolling in bed with pain <= 2/10 Baseline: no pain Goal status: MET    PLAN:  PT FREQUENCY: 2x/week  PT DURATION: 8 weeks  PLANNED INTERVENTIONS: Therapeutic exercises, Therapeutic activity, Neuromuscular re-education, Balance training, Gait training, Patient/Family education, Self Care, Joint mobilization, Aquatic Therapy, Dry Needling, Electrical stimulation, Cryotherapy, Moist heat, Taping, Ionotophoresis 4mg /ml Dexamethasone, Manual therapy, and Re-evaluation  PLAN FOR NEXT SESSION: Progress hip and core strength, gait and stair training   Gibson Telleria, PT 11/01/2022, 3:28 PM

## 2022-11-04 ENCOUNTER — Other Ambulatory Visit: Payer: Self-pay | Admitting: Family Medicine

## 2022-11-04 DIAGNOSIS — I10 Essential (primary) hypertension: Secondary | ICD-10-CM

## 2022-11-05 ENCOUNTER — Other Ambulatory Visit: Payer: Self-pay | Admitting: Family Medicine

## 2022-11-05 DIAGNOSIS — I10 Essential (primary) hypertension: Secondary | ICD-10-CM

## 2022-11-07 ENCOUNTER — Encounter: Payer: Self-pay | Admitting: Physical Therapy

## 2022-11-07 ENCOUNTER — Ambulatory Visit: Payer: 59 | Attending: Orthopaedic Surgery | Admitting: Physical Therapy

## 2022-11-07 DIAGNOSIS — M25551 Pain in right hip: Secondary | ICD-10-CM | POA: Diagnosis present

## 2022-11-07 DIAGNOSIS — R29898 Other symptoms and signs involving the musculoskeletal system: Secondary | ICD-10-CM | POA: Insufficient documentation

## 2022-11-07 DIAGNOSIS — R262 Difficulty in walking, not elsewhere classified: Secondary | ICD-10-CM | POA: Insufficient documentation

## 2022-11-07 DIAGNOSIS — M6281 Muscle weakness (generalized): Secondary | ICD-10-CM | POA: Diagnosis present

## 2022-11-07 NOTE — Therapy (Signed)
OUTPATIENT PHYSICAL THERAPY LOWER EXTREMITY TREATMENT   Patient Name: Sean [name] MRN: [id number] DOB:1975-08-19, 47 y.o., male Today's Date: 11/07/2022  END OF SESSION:  PT End of Session - 11/07/22 1440     Visit Number 8    Number of Visits 16    Date for PT Re-Evaluation 12/06/22    PT Start Time 1315    PT Stop Time 1357    PT Time Calculation (min) 42 min    Activity Tolerance Patient tolerated treatment well    Behavior During Therapy WFL for tasks assessed/performed                Past Medical History:  Diagnosis Date   Arthritis    Heartburn    Hyperlipidemia    Hypertension    Pre-diabetes    Sleep apnea    SOB (shortness of breath) on exertion    Swelling    feet and legs   Past Surgical History:  Procedure Laterality Date   NO PAST SURGERIES     TOTAL HIP ARTHROPLASTY Right 10/06/2022   Procedure: RIGHT TOTAL HIP ARTHROPLASTY ANTERIOR APPROACH;  Surgeon: Kathryne Hitch, MD;  Location: [facility];  Service: Orthopedics;  Laterality: Right;   Patient Active Problem List   Diagnosis Date Noted   Status post total replacement of right hip 10/06/2022   Low back pain 02/24/2021   Arthritis of right hip 01/16/2021   Bronchitis due to COVID-19 virus 11/01/2020   Hip pain 11/01/2020   Right thigh pain 05/13/2019   Uncontrolled type 2 diabetes mellitus with hyperglycemia (HCC) 04/10/2019   Hyperlipidemia associated with type 2 diabetes mellitus (HCC) 04/10/2019   Cutaneous skin tags 04/10/2019   Class 3 severe obesity with serious comorbidity and body mass index (BMI) of 50.0 to 59.9 in adult [facility]) 03/18/2018   Cellulitis of left lower extremity 09/07/2017   History of cellulitis 08/19/2017   Hypokalemia 08/19/2017   Lower extremity edema 08/19/2017   Depression 02/15/2017   Prediabetes 09/19/2016   Vitamin D deficiency 09/19/2016   Other fatigue 09/05/2016   Dyspnea on exertion 09/05/2016   Snoring 09/05/2016   Morbid obesity (HCC)  09/05/2016   Patellofemoral arthritis of right knee 08/11/2016   Severe obesity (BMI >= 40) (HCC) 05/09/2015   Hyperlipidemia 02/19/2014   Sinusitis, acute maxillary 02/09/2014   Infected laceration 02/09/2014   HTN (hypertension) 03/13/2011    PCP: Seabron Spates  REFERRING PROVIDER: Doneen Poisson  REFERRING DIAG: s/p Rt THA  THERAPY DIAG:  Muscle weakness (generalized)  Pain in right hip  Difficulty in walking, not elsewhere classified  Rationale for Evaluation and Treatment: Rehabilitation  ONSET DATE: 10/06/22  SUBJECTIVE:   SUBJECTIVE STATEMENT: Patient reports he goes to MD 11/13/22 and hopes to return to work after that visit. He states he still has trouble with hip ER to put on his shoe. He is able to manage stairs without as much difficulty  PERTINENT HISTORY: Arthritis, HTN PAIN:  Are you having pain? Yes: NPRS scale: 1/10 currenlty, up to 5/10 at times/10 Pain location: Rt hip Pain description: sore, burning Aggravating factors: certain positions Relieving factors: ice, meds  PRECAUTIONS: None  RED FLAGS: None   WEIGHT BEARING RESTRICTIONS: No  FALLS:  Has patient fallen in last 6 months? 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  PLOF: Independent  PATIENT GOALS: return to work mid October, be able to sleep, get up into his truck  NEXT MD VISIT: 11/13/22  OBJECTIVE:    PATIENT SURVEYS:  FOTO 49   PALPATION: TTP throughout Rt hip  LOWER EXTREMITY ROM:  Active ROM Right eval Left eval  Hip flexion 48   Hip extension -18   Hip abduction 0   Hip adduction    Hip internal rotation    Hip external rotation    Knee flexion    Knee extension    Ankle dorsiflexion    Ankle plantarflexion    Ankle  inversion    Ankle eversion     (Blank rows = not tested)  LOWER EXTREMITY MMT:  MMT Right eval Left eval Right 9/25  Hip flexion 3-  3  Hip extension 3-  3  Hip abduction 3-  3  Hip adduction     Hip internal rotation     Hip external rotation     Knee flexion 3    Knee extension 3    Ankle dorsiflexion     Ankle plantarflexion     Ankle inversion     Ankle eversion      (Blank rows = not tested)   GAIT: Distance walked: 110' Assistive device utilized: Environmental consultant - 2 wheeled Level of assistance: Complete Independence Comments: decreased cadence, decreased step and stride length   TODAY'S TREATMENT:    [facility] Adult PT Treatment:                                                DATE: 11/07/22 Therapeutic Exercise: Nustep L6 x 5 min LEs only Resisted walking backward/forward 20# x 10 Resisted walking laterally 20# x 10 bilat Gait stepping over obstacles forward and laterally focus on Rt hip flexion strength and endurance Suitcase carry 20# x 2 laps Sled push/pull 50# x 3 Squat tap 3 x 10 Hip ER stretch seated with strap 10 x 10 sec  Therapeutic Activity: FOTO 65   [facility] Adult PT Treatment:                                                DATE: 11/01/22 Therapeutic Exercise: Nustep L6 x 5 min Strength measurements (see above) Side stepping green TB crossed at ankles Fwd/Bkwd zig zag stepping with green TB crossed at ankles Sit <> stand seat tap 3 x 10 Seated hip flexor stretch Butterfly adductor stretch  Gait: Stair negotiation x 2 flights - pt continues to perform step to pattern, requires 1 UE support   [facility] Adult PT Treatment:                                                DATE: 10/30/2022 Therapeutic Exercise: Walking with focus on functional hip/knee flexion NuStep L6 x Standing: (R) fwd/bkwd step over block to fatigue  (R) M/L step over block to fatigue Side stepping GTB crossed at ankles Fwd & bkwd zig-zag stepping GTB crossed at ankles STS + pillow  seat  tap 3x10 Seated (R) hip flexor stretch Standing resisted (R) hip ext YTB 2x10 6" stepper: step ups front & lateral (alt stepping pattern) x1 min each Butterfly adductor stretch with support form black bolsters x1 min           PATIENT EDUCATION:  Education details: PT POC and goals, HEP Person educated: Patient Education method: Explanation, Demonstration, and Handouts Education comprehension: verbalized understanding and returned demonstration  HOME EXERCISE PROGRAM: Access Code: [email or web address] URL: [email or web address] Date: 10/25/2022 Prepared by: Carlynn Herald  Exercises - Seated Long Arc Quad  - 1 x daily - 7 x weekly - 3 sets - 10 reps - Small Range Straight Leg Raise  - 1 x daily - 7 x weekly - 3 sets - 10 reps - 3-5 sec hold - Seated Knee Flexion Slide  - 1 x daily - 7 x weekly - 3 sets - 10 reps - Standing Hip Flexion with Counter Support  - 1 x daily - 7 x weekly - 3 sets - 10 reps - Standing Hip Extension with Counter Support  - 1 x daily - 7 x weekly - 3 sets - 10 reps - Standing Hip Abduction with Counter Support  - 1 x daily - 7 x weekly - 3 sets - 10 reps - Standing 3-Way Leg Reach with Resistance at Ankles and Counter Support  - 1 x daily - 7 x weekly - 3 sets - 10 reps - Squat with Chair Touch  - 1 x daily - 7 x weekly - 3 sets - 10 reps  ASSESSMENT:  CLINICAL IMPRESSION: Pt with good tolerance to increased standing and work simulation during session. Some decreased ER strength and ROM - good tolerance to stretching with strap. He is progressing well towards goals and hopes to return to work next week.   OBJECTIVE IMPAIRMENTS: decreased activity tolerance, decreased balance, decreased mobility, difficulty walking, decreased ROM, decreased strength, and pain.     GOALS: Goals reviewed with patient? Yes  SHORT TERM GOALS: Target date: 11/08/2022  Pt will be independent with initial HEP Baseline: Goal status: MET  2.  Pt will improve Rt  hip strength to 3+/5 to improve gait and bed mobility Baseline:  Goal status: IN PROGRESS   LONG TERM GOALS: Target date: 12/06/2022  Pt will be independent in advanced HEP Baseline:  Goal status: INITIAL  2.  Pt will improve FOTO to >= 72 to demo improved functional mobility Baseline: 59 Goal status: IN PROGRESS  3.  Pt will improve Rt hip strength to 4+/5 to return to work duties Baseline:  Goal status: INITIAL  4.  Pt will perform gait with normalized gait pattern without AD x 150' to return to work Baseline:  Goal status: INITIAL  5.  Pt will perform supine <> sit and rolling in bed with pain <= 2/10 Baseline: no pain Goal status: MET    PLAN:  PT FREQUENCY: 2x/week  PT DURATION: 8 weeks  PLANNED INTERVENTIONS: Therapeutic exercises, Therapeutic activity, Neuromuscular re-education, Balance training, Gait training, Patient/Family education, Self Care, Joint mobilization, Aquatic Therapy, Dry Needling, Electrical stimulation, Cryotherapy, Moist heat, Taping, Ionotophoresis 4mg /ml Dexamethasone, Manual therapy, and Re-evaluation  PLAN FOR NEXT SESSION: check hip IR and strength, Progress hip and core strength, gait and stair training   Donnia Poplaski, PT 11/07/2022, 2:41 PM

## 2022-11-09 ENCOUNTER — Encounter: Payer: 59 | Admitting: Orthopaedic Surgery

## 2022-11-09 ENCOUNTER — Ambulatory Visit: Payer: 59

## 2022-11-09 ENCOUNTER — Other Ambulatory Visit: Payer: Self-pay

## 2022-11-09 DIAGNOSIS — M25551 Pain in right hip: Secondary | ICD-10-CM

## 2022-11-09 DIAGNOSIS — R29898 Other symptoms and signs involving the musculoskeletal system: Secondary | ICD-10-CM

## 2022-11-09 DIAGNOSIS — R262 Difficulty in walking, not elsewhere classified: Secondary | ICD-10-CM

## 2022-11-09 DIAGNOSIS — M6281 Muscle weakness (generalized): Secondary | ICD-10-CM | POA: Diagnosis not present

## 2022-11-09 NOTE — Therapy (Addendum)
OUTPATIENT PHYSICAL THERAPY LOWER EXTREMITY TREATMENT AND DISCHARGE   Patient Name: Sean [name] MRN: [id number] DOB:Feb 24, 1975, 47 y.o., male Today's Date: 11/09/2022  END OF SESSION:  PT End of Session - 11/09/22 1434     Visit Number 9    Number of Visits 16    Date for PT Re-Evaluation 12/06/22    Authorization Type [employer]    PT Start Time 1015    PT Stop Time 1056    PT Time Calculation (min) 41 min    Activity Tolerance Patient tolerated treatment well    Behavior During Therapy WFL for tasks assessed/performed            Past Medical History:  Diagnosis Date   Arthritis    Heartburn    Hyperlipidemia    Hypertension    Pre-diabetes    Sleep apnea    SOB (shortness of breath) on exertion    Swelling    feet and legs   Past Surgical History:  Procedure Laterality Date   NO PAST SURGERIES     TOTAL HIP ARTHROPLASTY Right 10/06/2022   Procedure: RIGHT TOTAL HIP ARTHROPLASTY ANTERIOR APPROACH;  Surgeon: Kathryne Hitch, MD;  Location: [facility];  Service: Orthopedics;  Laterality: Right;   Patient Active Problem List   Diagnosis Date Noted   Status post total replacement of right hip 10/06/2022   Low back pain 02/24/2021   Arthritis of right hip 01/16/2021   Bronchitis due to COVID-19 virus 11/01/2020   Hip pain 11/01/2020   Right thigh pain 05/13/2019   Uncontrolled type 2 diabetes mellitus with hyperglycemia (HCC) 04/10/2019   Hyperlipidemia associated with type 2 diabetes mellitus (HCC) 04/10/2019   Cutaneous skin tags 04/10/2019   Class 3 severe obesity with serious comorbidity and body mass index (BMI) of 50.0 to 59.9 in adult [facility]) 03/18/2018   Cellulitis of left lower extremity 09/07/2017   History of cellulitis 08/19/2017   Hypokalemia 08/19/2017   Lower extremity edema 08/19/2017   Depression 02/15/2017   Prediabetes 09/19/2016   Vitamin D deficiency 09/19/2016   Other fatigue 09/05/2016   Dyspnea on exertion 09/05/2016   Snoring  09/05/2016   Morbid obesity (HCC) 09/05/2016   Patellofemoral arthritis of right knee 08/11/2016   Severe obesity (BMI >= 40) (HCC) 05/09/2015   Hyperlipidemia 02/19/2014   Sinusitis, acute maxillary 02/09/2014   Infected laceration 02/09/2014   HTN (hypertension) 03/13/2011    PCP: Seabron Spates  REFERRING PROVIDER: Doneen Poisson  REFERRING DIAG: s/p Rt THA  THERAPY DIAG:  Muscle weakness (generalized)  Pain in right hip  Difficulty in walking, not elsewhere classified  Other symptoms and signs involving the musculoskeletal system  Rationale for Evaluation and Treatment: Rehabilitation  ONSET DATE: 10/06/22  SUBJECTIVE:   SUBJECTIVE STATEMENT: Patient returned to the clinic stating that his main goals are to continue strengthening the R lower extremity as well as improving his ability to cross his R foot to his L knee. He will be following up with Orthopedics on 11/13/2022 but is planning to continue physical therapy to achieve these goals.  PERTINENT HISTORY: Arthritis, HTN PAIN:  Are you having pain? Yes: NPRS scale: 1/10 currenlty, up to 5/10 at times/10 Pain location: Rt hip Pain description: sore, burning Aggravating factors: certain positions Relieving factors: ice, meds  PRECAUTIONS: None  RED FLAGS: None   WEIGHT BEARING RESTRICTIONS: No  FALLS:  Has patient fallen in last 6 months? 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  PLOF: Independent  PATIENT GOALS: return to work mid October, be able to sleep, get up into his truck  NEXT MD VISIT: 11/13/22  OBJECTIVE:    PATIENT SURVEYS:  FOTO 49   PALPATION: TTP throughout Rt hip  LOWER EXTREMITY ROM:  Active ROM Right eval Left eval Right 10/03  Hip  flexion 48    Hip extension -18    Hip abduction 0    Hip adduction     Hip internal rotation   20 (seated)  Hip external rotation     Knee flexion     Knee extension     Ankle dorsiflexion     Ankle plantarflexion     Ankle inversion     Ankle eversion      (Blank rows = not tested)  LOWER EXTREMITY MMT:  MMT Right eval Left eval Right 9/25 Right 10/03  Hip flexion 3-  3 4  Hip extension 3-  3 4  Hip abduction 3-  3 3+  Hip adduction    4  Hip internal rotation    4  Hip external rotation    4  Knee flexion 3     Knee extension 3     Ankle dorsiflexion      Ankle plantarflexion      Ankle inversion      Ankle eversion       (Blank rows = not tested)   GAIT: Distance walked: 110' Assistive device utilized: Environmental consultant - 2 wheeled Level of assistance: Complete Independence Comments: decreased cadence, decreased step and stride length   TODAY'S TREATMENT:    [facility] Adult PT Treatment:                                                DATE: 11/09/22 Therapeutic Exercise: Supine: Manual stretching of R hip into flexion, flexion + external rotation Hooklying, R ankle crossed to L knee: Active R hip IR/ER, 3 x 20, increasing hip flexion each set Seated on table, R ankle on physio ball to position R hip into FABER position: Active R hip IR/ER, 3 x 20, lowering table height after each set to deepen FABER position  [facility] Adult PT Treatment:                                                DATE: 11/07/22 Therapeutic Exercise: Nustep L6 x 5 min LEs only Resisted walking backward/forward 20# x 10 Resisted walking laterally 20# x 10 bilat Gait stepping over obstacles forward and laterally focus on Rt hip flexion strength and endurance Suitcase carry 20# x 2 laps Sled push/pull 50# x 3 Squat tap 3 x 10 Hip ER stretch seated with strap 10 x 10 sec  Therapeutic Activity: FOTO 65  [facility] Adult PT Treatment:  DATE: 11/01/22 Therapeutic  Exercise: Nustep L6 x 5 min Strength measurements (see above) Side stepping green TB crossed at ankles Fwd/Bkwd zig zag stepping with green TB crossed at ankles Sit <> stand seat tap 3 x 10 Seated hip flexor stretch Butterfly adductor stretch  Gait: Stair negotiation x 2 flights - pt continues to perform step to pattern, requires 1 UE support   [facility] Adult PT Treatment:                                                DATE: 10/30/2022 Therapeutic Exercise: Walking with focus on functional hip/knee flexion NuStep L6 x Standing: (R) fwd/bkwd step over block to fatigue  (R) M/L step over block to fatigue Side stepping GTB crossed at ankles Fwd & bkwd zig-zag stepping GTB crossed at ankles STS + pillow seat tap 3x10 Seated (R) hip flexor stretch Standing resisted (R) hip ext YTB 2x10 6" stepper: step ups front & lateral (alt stepping pattern) x1 min each Butterfly adductor stretch with support form black bolsters x1 min           PATIENT EDUCATION:  Education details: PT POC and goals, HEP Person educated: Patient Education method: Explanation, Demonstration, and Handouts Education comprehension: verbalized understanding and returned demonstration  HOME EXERCISE PROGRAM: Access Code: [email or web address] URL: [email or web address] Date: 10/25/2022 Prepared by: Carlynn Herald  Exercises - Seated Long Arc Quad  - 1 x daily - 7 x weekly - 3 sets - 10 reps - Small Range Straight Leg Raise  - 1 x daily - 7 x weekly - 3 sets - 10 reps - 3-5 sec hold - Seated Knee Flexion Slide  - 1 x daily - 7 x weekly - 3 sets - 10 reps - Standing Hip Flexion with Counter Support  - 1 x daily - 7 x weekly - 3 sets - 10 reps - Standing Hip Extension with Counter Support  - 1 x daily - 7 x weekly - 3 sets - 10 reps - Standing Hip Abduction with Counter Support  - 1 x daily - 7 x weekly - 3 sets - 10 reps - Standing 3-Way Leg Reach with Resistance at Ankles and Counter Support  - 1 x daily -  7 x weekly - 3 sets - 10 reps - Squat with Chair Touch  - 1 x daily - 7 x weekly - 3 sets - 10 reps  ASSESSMENT:  CLINICAL IMPRESSION: Focus of this session was on R hip mobility into FABER position using both active and passive techniques. Patient tolerated well with notable improvement in ability to lift R foot toward L knee, reaching top 1/3 of L shin after interventions today. The patient does continue to demonstrate ROM and strength restrictions at the R hip and therefore continuing physical therapy after follow up with Orthopedics is indicated to further restore function.   OBJECTIVE IMPAIRMENTS: decreased activity tolerance, decreased balance, decreased mobility, difficulty walking, decreased ROM, decreased strength, and pain.   GOALS: Goals reviewed with patient? Yes  SHORT TERM GOALS: Target date: 11/08/2022  Pt will be independent with initial HEP Baseline: Goal status: MET  2.  Pt will improve Rt hip strength to 3+/5 to improve gait and bed mobility Baseline:  Goal status: IN PROGRESS   LONG TERM GOALS: Target date: 12/06/2022  Pt will be independent in advanced HEP Baseline:  Goal status: INITIAL  2.  Pt will improve FOTO to >= 72 to demo improved functional mobility Baseline: 59 Goal status: IN PROGRESS  3.  Pt will improve Rt hip strength to 4+/5 to return to work duties Baseline:  Goal status: INITIAL  4.  Pt will perform gait with normalized gait pattern without AD x 150' to return to work Baseline:  Goal status: INITIAL  5.  Pt will perform supine <> sit and rolling in bed with pain <= 2/10 Baseline: no pain Goal status: MET    PLAN:  PT FREQUENCY: 2x/week  PT DURATION: 8 weeks  PLANNED INTERVENTIONS: Therapeutic exercises, Therapeutic activity, Neuromuscular re-education, Balance training, Gait training, Patient/Family education, Self Care, Joint mobilization, Aquatic Therapy, Dry Needling, Electrical stimulation, Cryotherapy, Moist heat,  Taping, Ionotophoresis 4mg /ml Dexamethasone, Manual therapy, and Re-evaluation  PLAN FOR NEXT SESSION: check hip IR and strength, Progress hip and core strength, gait and stair training  PHYSICAL THERAPY DISCHARGE SUMMARY  Visits from Start of Care: 9  Current functional level related to goals / functional outcomes: Improved strength and ROM and activity tolerance   Remaining deficits: See above   Education / Equipment: HEP   Patient agrees to discharge. Patient goals were partially met. Patient is being discharged due to being pleased with the current functional level. Reggy Eye, PT,DPT11/08/249:21 AM   Clelia Schaumann, PT 11/09/2022, 2:36 PM

## 2022-11-13 ENCOUNTER — Encounter: Payer: Self-pay | Admitting: Orthopaedic Surgery

## 2022-11-13 ENCOUNTER — Ambulatory Visit (INDEPENDENT_AMBULATORY_CARE_PROVIDER_SITE_OTHER): Payer: 59 | Admitting: Orthopaedic Surgery

## 2022-11-13 DIAGNOSIS — Z96641 Presence of right artificial hip joint: Secondary | ICD-10-CM

## 2022-11-13 NOTE — Progress Notes (Signed)
The patient is getting close to 6 weeks status post a right total hip arthroplasty.  He is only 47 years old but had debilitating arthritis of the right hip and was dealing with morbid obesity as well.  He had been on a weight loss journey and we felt comfortable with proceeding with hip replacement surgery.  He is now in physical therapy and doing well overall.  He feels like he can go back to work for half days starting in a week.  He is walking without assistive device but still has a limp to be expected given that he was dealing with such severe hip issues for 2 years during his weight loss journey.  His leg lengths feel equal and he tolerates me putting his hip through range of motion on the right side.  I did give him a note to let him return to work starting October 14 but for 4 to 6-hour days only for the first 2 weeks.  Starting October 20 he can resume full work days.  I would like to see him back in 6 weeks.  At that visit I would like a standing AP pelvis and lateral of his right operative hip.

## 2022-12-03 ENCOUNTER — Other Ambulatory Visit: Payer: Self-pay | Admitting: Family Medicine

## 2022-12-03 DIAGNOSIS — I10 Essential (primary) hypertension: Secondary | ICD-10-CM

## 2022-12-07 ENCOUNTER — Encounter: Payer: Self-pay | Admitting: Podiatry

## 2022-12-07 ENCOUNTER — Ambulatory Visit: Payer: 59 | Admitting: Podiatry

## 2022-12-07 DIAGNOSIS — L600 Ingrowing nail: Secondary | ICD-10-CM | POA: Diagnosis not present

## 2022-12-07 NOTE — Progress Notes (Signed)
Subjective:  Patient ID: Sean [name], male    DOB: [birth year],   MRN: [id number]  Chief Complaint  Patient presents with   Nail Problem    Left hallux nail painful, no drainage non diabetic, injured the nail about 2 years ago    47 y.o. male presents for concern of left hallux nail that he injured a couple years ago. Relates it is raised and can be painful. States he has tried trimming and dealing with it but wanted to come have it checked out . Denies any other pedal complaints. Denies n/v/f/c.   Past Medical History:  Diagnosis Date   Arthritis    Heartburn    Hyperlipidemia    Hypertension    Pre-diabetes    Sleep apnea    SOB (shortness of breath) on exertion    Swelling    feet and legs    Objective:  Physical Exam: Vascular: DP/PT pulses 2/4 bilateral. CFT <3 seconds. Normal hair growth on digits. No edema.  Skin. No lacerations or abrasions bilateral feet. Lateral border left hallux with incurvation and mild erythema surrounding and tender to palpation.  Musculoskeletal: MMT 5/5 bilateral lower extremities in DF, PF, Inversion and Eversion. Deceased ROM in DF of ankle joint.  Neurological: Sensation intact to light touch.   Assessment:   1. Ingrown left greater toenail      Plan:  Patient was evaluated and treated and all questions answered. Discussed ingrown toenails etiology and treatment options including procedure for removal vs conservative care.  Patient requesting removal of ingrown nail today. Procedure below.  Discussed procedure and post procedure care and patient expressed understanding.  Will follow-up in 2 weeks for nail check or sooner if any problems arise.    Procedure:  Procedure: partial Nail Avulsion of left hallux lateral nail border.  Surgeon: Louann Sjogren, DPM  Pre-op Dx: Ingrown toenail without infection Post-op: Same  Place of Surgery: Office exam room.  Indications for surgery: Painful and ingrown toenail.    The patient is  requesting removal of nail with  chemical matrixectomy. Risks and complications were discussed with the patient for which they understand and written consent was obtained. Under sterile conditions a total of 3 mL of  1% lidocaine plain was infiltrated in a hallux block fashion. Once anesthetized, the skin was prepped in sterile fashion. A tourniquet was then applied. Next the lateral aspect of hallux nail border was then sharply excised making sure to remove the entire offending nail border.  Next phenol was then applied under standard conditions to permanently destroy the matrix and copiously irrigated. Silvadene was applied. A dry sterile dressing was applied. After application of the dressing the tourniquet was removed and there is found to be an immediate capillary refill time to the digit. The patient tolerated the procedure well without any complications. Post procedure instructions were discussed the patient for which he verbally understood. Follow-up in two weeks for nail check or sooner if any problems are to arise. Discussed signs/symptoms of infection and directed to call the office immediately should any occur or go directly to the emergency room. In the meantime, encouraged to call the office with any questions, concerns, changes symptoms.   Louann Sjogren, DPM

## 2022-12-07 NOTE — Patient Instructions (Signed)

## 2022-12-22 ENCOUNTER — Ambulatory Visit (INDEPENDENT_AMBULATORY_CARE_PROVIDER_SITE_OTHER): Payer: 59 | Admitting: Podiatry

## 2022-12-22 ENCOUNTER — Encounter: Payer: Self-pay | Admitting: Podiatry

## 2022-12-22 DIAGNOSIS — L600 Ingrowing nail: Secondary | ICD-10-CM

## 2022-12-22 NOTE — Progress Notes (Signed)
  Subjective:  Patient ID: Sean [name], male    DOB: [birth year],   MRN: [id number]  No chief complaint on file.   47 y.o. male presents for follow-up of left ingrown nail procedure. Has been soaking as instructed and keeping bandage on it. Relates some soreness but overall doing well.  . Denies any other pedal complaints. Denies n/v/f/c.   Past Medical History:  Diagnosis Date   Arthritis    Heartburn    Hyperlipidemia    Hypertension    Pre-diabetes    Sleep apnea    SOB (shortness of breath) on exertion    Swelling    feet and legs    Objective:  Physical Exam: Vascular: DP/PT pulses 2/4 bilateral. CFT <3 seconds. Normal hair growth on digits. No edema.  Skin. No lacerations or abrasions bilateral feet. Left hallux healing well. Mild erythema noted but no major concerns for infection.  Musculoskeletal: MMT 5/5 bilateral lower extremities in DF, PF, Inversion and Eversion. Deceased ROM in DF of ankle joint.  Neurological: Sensation intact to light touch.   Assessment:   1. Ingrown left greater toenail      Plan:  Patient was evaluated and treated and all questions answered. Toe was evaluated and appears to be healing well.  May discontinue soaks and neosporin. Taper off.  Patient to follow-up as needed.    Louann Sjogren, DPM

## 2022-12-25 ENCOUNTER — Encounter: Payer: Self-pay | Admitting: Orthopaedic Surgery

## 2022-12-25 ENCOUNTER — Other Ambulatory Visit (INDEPENDENT_AMBULATORY_CARE_PROVIDER_SITE_OTHER): Payer: 59

## 2022-12-25 ENCOUNTER — Ambulatory Visit (INDEPENDENT_AMBULATORY_CARE_PROVIDER_SITE_OTHER): Payer: 59 | Admitting: Orthopaedic Surgery

## 2022-12-25 DIAGNOSIS — Z96641 Presence of right artificial hip joint: Secondary | ICD-10-CM

## 2022-12-25 NOTE — Progress Notes (Signed)
The patient is here today in follow-up as a relates to his right total hip replacement.  This was performed on October 06, 2022.  It is now almost 3 months since that surgery.  He has been back to work on a regular schedule at this point.  He is now getting better with performing his ADLs and getting less stiff in terms of putting his shoes and socks on.  He has been dealing with worsening hip for several years.  He is walking with a normal-appearing gait.  He tolerates me putting both hips through rotation.  His right hip incision is healed nicely and there is no complicating features of the incision.  I think his leg lengths are equal.  An AP pelvis and lateral of the right hip shows a well-seated total hip arthroplasty with no complicating features.  The patient's native left hip joint space is well-maintained.  At this point he will continue to increase his activities as comfort allows.  I would like to see him back in about 6 months with a final AP pelvis.  If there are issues before then he knows to let us know.

## 2023-03-01 ENCOUNTER — Other Ambulatory Visit: Payer: Self-pay | Admitting: Family Medicine

## 2023-03-01 DIAGNOSIS — I10 Essential (primary) hypertension: Secondary | ICD-10-CM

## 2023-03-06 ENCOUNTER — Ambulatory Visit (HOSPITAL_BASED_OUTPATIENT_CLINIC_OR_DEPARTMENT_OTHER)
Admission: RE | Admit: 2023-03-06 | Discharge: 2023-03-06 | Disposition: A | Discharge status: Home / Self Care | Payer: BC Managed Care – PPO | Source: Ambulatory Visit | Attending: Family Medicine | Admitting: Family Medicine

## 2023-03-06 ENCOUNTER — Encounter: Payer: Self-pay | Admitting: Family Medicine

## 2023-03-06 ENCOUNTER — Ambulatory Visit: Payer: 59 | Admitting: Family Medicine

## 2023-03-06 VITALS — BP 128/70 | HR 77 | Temp 99.1°F | Resp 18 | Ht 69.0 in | Wt 353.2 lb

## 2023-03-06 DIAGNOSIS — E785 Hyperlipidemia, unspecified: Secondary | ICD-10-CM

## 2023-03-06 DIAGNOSIS — E559 Vitamin D deficiency, unspecified: Secondary | ICD-10-CM

## 2023-03-06 DIAGNOSIS — E1165 Type 2 diabetes mellitus with hyperglycemia: Secondary | ICD-10-CM

## 2023-03-06 DIAGNOSIS — J40 Bronchitis, not specified as acute or chronic: Secondary | ICD-10-CM

## 2023-03-06 DIAGNOSIS — R051 Acute cough: Secondary | ICD-10-CM

## 2023-03-06 DIAGNOSIS — I10 Essential (primary) hypertension: Secondary | ICD-10-CM | POA: Diagnosis not present

## 2023-03-06 LAB — POC INFLUENZA A&B (BINAX/QUICKVUE)
Influenza A, POC: NEGATIVE
Influenza B, POC: NEGATIVE

## 2023-03-06 LAB — POC COVID19 BINAXNOW: SARS Coronavirus 2 Ag: NEGATIVE

## 2023-03-06 MED ORDER — TIRZEPATIDE 15 MG/0.5ML ~~LOC~~ SOAJ: 15.0000 mg | SUBCUTANEOUS | 3 refills | Status: DC

## 2023-03-06 MED ORDER — FENOFIBRATE 160 MG PO TABS
160.0000 mg | ORAL_TABLET | Freq: Every day | ORAL | 1 refills | Status: DC
Start: 2023-03-06 — End: 2023-07-31

## 2023-03-06 MED ORDER — TIRZEPATIDE 15 MG/0.5ML ~~LOC~~ SOAJ
15.0000 mg | SUBCUTANEOUS | 3 refills | Status: DC
Start: 2023-03-06 — End: 2023-03-06

## 2023-03-06 MED ORDER — METOPROLOL SUCCINATE ER 100 MG PO TB24
ORAL_TABLET | ORAL | 3 refills | Status: DC
Start: 2023-03-06 — End: 2023-07-31

## 2023-03-06 MED ORDER — AMOXICILLIN-POT CLAVULANATE 875-125 MG PO TABS: 1.0000 | ORAL_TABLET | Freq: Two times a day (BID) | ORAL | 0 refills | Status: DC

## 2023-03-06 MED ORDER — ATORVASTATIN CALCIUM 40 MG PO TABS
40.0000 mg | ORAL_TABLET | Freq: Every day | ORAL | 1 refills | Status: DC
Start: 2023-03-06 — End: 2023-07-31

## 2023-03-06 MED ORDER — CHLORTHALIDONE 25 MG PO TABS
25.0000 mg | ORAL_TABLET | Freq: Every day | ORAL | 1 refills | Status: DC
Start: 2023-03-06 — End: 2023-07-31

## 2023-03-06 MED ORDER — PROMETHAZINE-DM 6.25-15 MG/5ML PO SYRP
5.0000 mL | ORAL_SOLUTION | Freq: Four times a day (QID) | ORAL | 0 refills | Status: DC | PRN
Start: 2023-03-06 — End: 2023-06-04

## 2023-03-06 NOTE — Progress Notes (Signed)
Established Patient Office Visit  Subjective   Patient ID: Sean [name], male    DOB: Aug 13, 1975  Age: 48 y.o. MRN: [id number]  Chief Complaint  Patient presents with   Cough    Sxs started wed/Thursday last week. Pt states otc meds. Mostly dry cough, trouble sleeping with cough. Sxs going through house. Wife has walking pneumonia. No COVID test    HPI Discussed the use of AI scribe software for clinical note transcription with the patient, who gave verbal consent to proceed.  History of Present Illness   The patient presents with a persistent cough and respiratory symptoms.  He began feeling unwell around Wednesday of last week, with symptoms worsening by Thursday. He developed a predominant, non-productive cough that has persisted and worsened despite self-treatment. The cough is severe enough to disrupt his sleep, waking him up every 30 minutes at night. He also experiences a runny nose and some productivity in what he coughs up, but notes that 'everything up here hurts' due to the frequent coughing. No fever, but he mentions a low-grade temperature. He feels tightness in the chest and experiences some wheezing. Despite these efforts, he feels very drained and reports that his wife had to sleep in another room due to his coughing.  He has been taking chlorosetam, Mucinex, and increased fluid intake to manage his symptoms. He has also been taking ibuprofen 800 mg after work. He mentions having received a flu shot this past year and has been trying to avoid getting sick, especially since his wife had walking pneumonia and his stepchildren have been ill. He has been working throughout this period, including a 13-hour shift on Friday, which he found very challenging due to his symptoms.  He works at [employer] and has been there for three years. His wife had to sleep in another room due to his coughing.  He had a hip replacement on August 30th of last year, which he describes as a  positive experience overall. He is currently on several medications including metoprolol, fenofibrate, chlorthalidone, atorvastatin, and vitamin D. He also takes a multivitamin, vitamin C, turmeric, tart cherry, potassium, and calcium supplements. He has not taken his [place] medication in the past two weeks.      Patient Active Problem List   Diagnosis Date Noted   Status post total replacement of right hip 10/06/2022   Low back pain 02/24/2021   Arthritis of right hip 01/16/2021   Bronchitis due to COVID-19 virus 11/01/2020   Hip pain 11/01/2020   Right thigh pain 05/13/2019   Uncontrolled type 2 diabetes mellitus with hyperglycemia (HCC) 04/10/2019   Hyperlipidemia associated with type 2 diabetes mellitus (HCC) 04/10/2019   Cutaneous skin tags 04/10/2019   Class 3 severe obesity with serious comorbidity and body mass index (BMI) of 50.0 to 59.9 in adult [facility]) 03/18/2018   Cellulitis of left lower extremity 09/07/2017   History of cellulitis 08/19/2017   Hypokalemia 08/19/2017   Lower extremity edema 08/19/2017   Depression 02/15/2017   Prediabetes 09/19/2016   Vitamin D deficiency 09/19/2016   Other fatigue 09/05/2016   Dyspnea on exertion 09/05/2016   Snoring 09/05/2016   Morbid obesity (HCC) 09/05/2016   Patellofemoral arthritis of right knee 08/11/2016   Severe obesity (BMI >= 40) (HCC) 05/09/2015   Hyperlipidemia 02/19/2014   Sinusitis, acute maxillary 02/09/2014   Infected laceration 02/09/2014   HTN (hypertension) 03/13/2011   Past Medical History:  Diagnosis Date   Arthritis    Heartburn  Hyperlipidemia    Hypertension    Pre-diabetes    Sleep apnea    SOB (shortness of breath) on exertion    Swelling    feet and legs   Past Surgical History:  Procedure Laterality Date   NO PAST SURGERIES     TOTAL HIP ARTHROPLASTY Right 10/06/2022   Procedure: RIGHT TOTAL HIP ARTHROPLASTY ANTERIOR APPROACH;  Surgeon: Kathryne Hitch, MD;  Location: [facility];  Service:  Orthopedics;  Laterality: Right;   Social History   Tobacco Use   Smoking status: Never   Smokeless tobacco: Never  Vaping Use   Vaping status: Never Used  Substance Use Topics   Alcohol use: Yes    Comment: occas.   Drug use: No   Social History   Socioeconomic History   Marital status: Married    Spouse name: Not on file   Number of children: Not on file   Years of education: Not on file   Highest education level: Not on file  Occupational History   Occupation: Dietitian  Tobacco Use   Smoking status: Never   Smokeless tobacco: Never  Vaping Use   Vaping status: Never Used  Substance and Sexual Activity   Alcohol use: Yes    Comment: occas.   Drug use: No   Sexual activity: Yes  Other Topics Concern   Not on file  Social History Narrative   Not on file   Social Drivers of Health   Financial Resource Strain: Not on file  Food Insecurity: No Food Insecurity (10/06/2022)   Hunger Vital Sign    Worried About Running Out of Food in the Last Year: Never true    Ran Out of Food in the Last Year: Never true  Transportation Needs: No Transportation Needs (10/06/2022)   PRAPARE - Administrator, Civil Service (Medical): No    Lack of Transportation (Non-Medical): No  Physical Activity: Not on file  Stress: Not on file  Social Connections: Unknown (06/19/2021)   Received from [facility]   Social Network    Social Network: Not on file  Intimate Partner Violence: Not At Risk (10/06/2022)   Humiliation, Afraid, Rape, and Kick questionnaire    Fear of Current or Ex-Partner: No    Emotionally Abused: No    Physically Abused: No    Sexually Abused: No   Family Status  Relation Name Status   Father  Deceased   Mother  Alive   MGM  (Not Specified)  No partnership data on file   Family History  Problem Relation Age of Onset   Heart disease Father 72       MI   Hypertension Father    Sudden death Father        Heart  disease   Hyperlipidemia Father    Obesity Father    Diabetes Mother        Borderline   Hyperlipidemia Mother    Hypertension Mother    Depression Mother    Obesity Mother    Breast cancer Maternal Grandmother    No Known Allergies    ROS    Objective:     BP 128/70 (BP Location: Right Arm, Patient Position: Sitting, Cuff Size: Large)   Pulse 77   Temp 99.1 F (37.3 C) (Oral)   Resp 18   Ht 5\' 9"  (1.753 m)   Wt (!) 353 lb 3.2 oz (160.2 kg)   SpO2 97%   BMI 52.16  kg/m  BP Readings from Last 3 Encounters:  03/06/23 128/70  10/07/22 126/69  09/26/22 133/89   Wt Readings from Last 3 Encounters:  03/06/23 (!) 353 lb 3.2 oz (160.2 kg)  10/06/22 (!) 317 lb (143.8 kg)  09/26/22 (!) 317 lb (143.8 kg)   SpO2 Readings from Last 3 Encounters:  03/06/23 97%  10/07/22 97%  09/26/22 100%      Physical Exam   No results found for any visits on 03/06/23.  Last CBC Lab Results  Component Value Date   WBC 15.1 (H) 10/07/2022   HGB 12.5 (L) 10/07/2022   HCT 36.8 (L) 10/07/2022   MCV 85.6 10/07/2022   MCH 29.1 10/07/2022   RDW 12.1 10/07/2022   PLT 248 10/07/2022   Last metabolic panel Lab Results  Component Value Date   GLUCOSE 141 (H) 10/07/2022   NA 137 10/07/2022   K 3.3 (L) 10/07/2022   CL 101 10/07/2022   CO2 25 10/07/2022   BUN 15 10/07/2022   CREATININE 0.92 10/07/2022   GFRNONAA >60 10/07/2022   CALCIUM 8.9 10/07/2022   PROT 7.3 09/26/2022   ALBUMIN 4.3 09/26/2022   LABGLOB 2.7 03/14/2018   AGRATIO 1.6 03/14/2018   BILITOT 0.8 09/26/2022   ALKPHOS 56 09/26/2022   AST 27 09/26/2022   ALT 39 09/26/2022   ANIONGAP 11 10/07/2022   Last lipids Lab Results  Component Value Date   CHOL 144 05/30/2022   HDL 45.10 05/30/2022   LDLCALC 86 05/30/2022   LDLDIRECT 107.0 08/05/2019   TRIG 66.0 05/30/2022   CHOLHDL 3 05/30/2022   Last hemoglobin A1c Lab Results  Component Value Date   HGBA1C 5.3 09/26/2022   Last thyroid functions Lab Results   Component Value Date   TSH 2.92 05/30/2022   T3TOTAL 113 03/14/2018   Last vitamin D Lab Results  Component Value Date   VD25OH 54.65 05/30/2022   Last vitamin B12 and Folate Lab Results  Component Value Date   VITAMINB12 557 12/16/2020   FOLATE 10.9 09/05/2016      The 10-year ASCVD risk score (Arnett DK, et al., 2019) is: 4.3%    Assessment & Plan:   Problem List Items Addressed This Visit   None  Assessment and Plan    Persistent Cough A non-productive cough has persisted for a week, worsening despite over-the-counter medications like chlorpheniramine and Mucinex, and increased fluid intake. Symptoms include rhinorrhea, chest tightness, and wheezing, but no fever. The differential diagnosis includes viral URI, bronchitis, or early pneumonia. A chest x-ray will confirm if antibiotics are needed for a bacterial infection. Cough medicine is advised for sleep, and taking a couple of days off work is recommended, with a potential return on Friday if symptoms improve. Flu and COVID-19 tests are ordered, along with a chest x-ray. An antibiotic will be prescribed if the chest x-ray indicates a bacterial infection.  Obesity There has been a weight gain of 20-30 pounds over the holidays, but there is readiness to resume weight loss efforts. Currently on semaglutide 15 mg, and it is important to continue this dose if the break in medication was less than two weeks. Encouragement is given for healthy lifestyle modifications.  Hypertension Currently on metoprolol for hypertension and compliant with the medication regimen. A prescription renewal is required.  Hyperlipidemia On fenofibrate and atorvastatin for hyperlipidemia, with compliance in the medication regimen. Prescription renewals are needed for both medications.  Vitamin D Deficiency Previously on a high-dose vitamin D supplement, it is now  advised to switch to OTC vitamin D3, 2000 units daily, which is sufficient for  maintenance.  General Health Maintenance Compliant with a supplement regimen that includes multivitamins, vitamin C, turmeric, tart cherry, potassium, and calcium. Continuation of the current supplement regimen is advised.  Follow-up A follow-up appointment is scheduled in three months.       No follow-ups on file.    Donato Schultz, DO

## 2023-03-07 LAB — CBC WITH DIFFERENTIAL/PLATELET
Basophils Absolute: 0.1 10*3/uL (ref 0.0–0.1)
Basophils Relative: 0.9 % (ref 0.0–3.0)
Eosinophils Absolute: 0.6 10*3/uL (ref 0.0–0.7)
Eosinophils Relative: 6.7 % — ABNORMAL HIGH (ref 0.0–5.0)
HCT: 42.2 % (ref 39.0–52.0)
Hemoglobin: 14.5 g/dL (ref 13.0–17.0)
Lymphocytes Relative: 26.7 % (ref 12.0–46.0)
Lymphs Abs: 2.4 10*3/uL (ref 0.7–4.0)
MCHC: 34.4 g/dL (ref 30.0–36.0)
MCV: 84.2 fL (ref 78.0–100.0)
Monocytes Absolute: 0.9 10*3/uL (ref 0.1–1.0)
Monocytes Relative: 10.5 % (ref 3.0–12.0)
Neutro Abs: 4.9 10*3/uL (ref 1.4–7.7)
Neutrophils Relative %: 55.2 % (ref 43.0–77.0)
Platelets: 276 10*3/uL (ref 150.0–400.0)
RBC: 5.01 Mil/uL (ref 4.22–5.81)
RDW: 13.4 % (ref 11.5–15.5)
WBC: 8.9 10*3/uL (ref 4.0–10.5)

## 2023-03-07 LAB — COMPREHENSIVE METABOLIC PANEL
ALT: 28 U/L (ref 0–53)
AST: 29 U/L (ref 0–37)
Albumin: 4.3 g/dL (ref 3.5–5.2)
Alkaline Phosphatase: 61 U/L (ref 39–117)
BUN: 16 mg/dL (ref 6–23)
CO2: 30 meq/L (ref 19–32)
Calcium: 9.5 mg/dL (ref 8.4–10.5)
Chloride: 100 meq/L (ref 96–112)
Creatinine, Ser: 1.05 mg/dL (ref 0.40–1.50)
GFR: 84.13 mL/min (ref >60.00)
Glucose, Bld: 88 mg/dL (ref 70–99)
Potassium: 3.1 meq/L — ABNORMAL LOW (ref 3.5–5.1)
Sodium: 138 meq/L (ref 135–145)
Total Bilirubin: 0.7 mg/dL (ref 0.2–1.2)
Total Protein: 6.7 g/dL (ref 6.0–8.3)

## 2023-03-07 LAB — HEMOGLOBIN A1C: Hgb A1c MFr Bld: 5.9 % (ref 4.6–6.5)

## 2023-03-07 LAB — LIPID PANEL
Cholesterol: 154 mg/dL (ref 0–200)
HDL: 36 mg/dL — ABNORMAL LOW (ref >39.00)
LDL Cholesterol: 100 mg/dL — ABNORMAL HIGH (ref 0–99)
NonHDL: 118.05
Total CHOL/HDL Ratio: 4
Triglycerides: 88 mg/dL (ref 0.0–149.0)
VLDL: 17.6 mg/dL (ref 0.0–40.0)

## 2023-03-08 ENCOUNTER — Other Ambulatory Visit: Payer: Self-pay | Admitting: Family Medicine

## 2023-03-08 DIAGNOSIS — I10 Essential (primary) hypertension: Secondary | ICD-10-CM

## 2023-03-09 ENCOUNTER — Encounter: Payer: Self-pay | Admitting: Family Medicine

## 2023-03-14 ENCOUNTER — Other Ambulatory Visit (HOSPITAL_BASED_OUTPATIENT_CLINIC_OR_DEPARTMENT_OTHER): Payer: Self-pay | Admitting: Nurse Practitioner

## 2023-03-14 ENCOUNTER — Encounter: Payer: Self-pay | Admitting: Nurse Practitioner

## 2023-03-14 MED ORDER — AZITHROMYCIN 250 MG PO TABS: ORAL_TABLET | ORAL | 0 refills | Status: AC

## 2023-03-19 ENCOUNTER — Other Ambulatory Visit: Payer: Self-pay | Admitting: Family Medicine

## 2023-03-19 DIAGNOSIS — J40 Bronchitis, not specified as acute or chronic: Secondary | ICD-10-CM

## 2023-03-19 MED ORDER — PREDNISONE 10 MG PO TABS: ORAL_TABLET | ORAL | 0 refills | Status: DC

## 2023-06-04 ENCOUNTER — Ambulatory Visit: Payer: BC Managed Care – PPO | Admitting: Family Medicine

## 2023-06-04 ENCOUNTER — Encounter: Payer: Self-pay | Admitting: Family Medicine

## 2023-06-04 DIAGNOSIS — E1165 Type 2 diabetes mellitus with hyperglycemia: Secondary | ICD-10-CM

## 2023-06-04 DIAGNOSIS — Z6841 Body Mass Index (BMI) 40.0 and over, adult: Secondary | ICD-10-CM | POA: Diagnosis not present

## 2023-06-04 DIAGNOSIS — Z7985 Long-term (current) use of injectable non-insulin antidiabetic drugs: Secondary | ICD-10-CM | POA: Diagnosis not present

## 2023-06-04 NOTE — Progress Notes (Signed)
 Established Patient Office Visit  Subjective   Patient ID: Sean [name], male    DOB: [birth year]  Age: 48 y.o. MRN: [id number]  Chief Complaint  Patient presents with   Weight Check    HPI Discussed the use of AI scribe software for clinical note transcription with the patient, who gave verbal consent to proceed.  History of Present Illness Sean [name] is a 48 year old male who presents with concerns about weight management and work-related stress.  He experiences mental and physical fatigue due to a demanding work schedule as a Biochemist, clinical, which has affected his focus on weight management and maintaining a regular workout routine. He works long hours without consecutive days off, contributing to his stress and fatigue.  He is dissatisfied with his current weight management, noting a lack of focus compared to before. He takes a medication for weight management, which he received a three-month supply of in January, and is good through July. The medication has a suppressant effect, but he sometimes overeats and feels unwell afterward, describing it as feeling like 'eating Thanksgiving dinner times ten.' He is trying to re-establish a routine that includes regular exercise.  He discusses a recent experience at a pharmacy where he was informed about a lack of documentation for hepatitis A and B vaccinations. 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  He mentions a previous blood workup in January, which showed slightly off potassium levels but was otherwise normal. He feels generally well but acknowledges that he is not at his best weight-wise.  He has some residual numbness in his leg but reports improved mobility, such as being able to lift his leg to put on a sock and shoe. He attributes this improvement to staying active at work.   Patient Active Problem List   Diagnosis Date  Noted   Status post total replacement of right hip 10/06/2022   Low back pain 02/24/2021   Arthritis of right hip 01/16/2021   Bronchitis due to COVID-19 virus 11/01/2020   Hip pain 11/01/2020   Right thigh pain 05/13/2019   Uncontrolled type 2 diabetes mellitus with hyperglycemia (HCC) 04/10/2019   Hyperlipidemia associated with type 2 diabetes mellitus (HCC) 04/10/2019   Cutaneous skin tags 04/10/2019   Class 3 severe obesity with serious comorbidity and body mass index (BMI) of 50.0 to 59.9 in adult [facility]) 03/18/2018   Cellulitis of left lower extremity 09/07/2017   History of cellulitis 08/19/2017   Hypokalemia 08/19/2017   Lower extremity edema 08/19/2017   Depression 02/15/2017   Prediabetes 09/19/2016   Vitamin D  deficiency 09/19/2016   Other fatigue 09/05/2016   Dyspnea on exertion 09/05/2016   Snoring 09/05/2016   Morbid obesity (HCC) 09/05/2016   Patellofemoral arthritis of right knee 08/11/2016   Severe obesity (BMI >= 40) (HCC) 05/09/2015   Hyperlipidemia 02/19/2014   Sinusitis, acute maxillary 02/09/2014   Infected laceration 02/09/2014   HTN (hypertension) 03/13/2011   Past Medical History:  Diagnosis Date   Arthritis    Heartburn    Hyperlipidemia    Hypertension    Pre-diabetes    Sleep apnea    SOB (shortness of breath) on exertion    Swelling    feet and legs   Past Surgical History:  Procedure Laterality Date   NO PAST SURGERIES     TOTAL HIP ARTHROPLASTY Right 10/06/2022   Procedure: RIGHT TOTAL HIP  ARTHROPLASTY ANTERIOR APPROACH;  Surgeon: Arnie Lao, MD;  Location: [facility];  Service: Orthopedics;  Laterality: Right;   Social History   Tobacco Use   Smoking status: Never   Smokeless tobacco: Never  Vaping Use   Vaping status: Never Used  Substance Use Topics   Alcohol use: Yes    Comment: occas.   Drug use: No   Social History   Socioeconomic History   Marital status: Married    Spouse name: Not on file   Number of  children: Not on file   Years of education: Not on file   Highest education level: Not on file  Occupational History   Occupation: Dietitian  Tobacco Use   Smoking status: Never   Smokeless tobacco: Never  Vaping Use   Vaping status: Never Used  Substance and Sexual Activity   Alcohol use: Yes    Comment: occas.   Drug use: No   Sexual activity: Yes  Other Topics Concern   Not on file  Social History Narrative   Not on file   Social Drivers of Health   Financial Resource Strain: Not on file  Food Insecurity: No Food Insecurity (10/06/2022)   Hunger Vital Sign    Worried About Running Out of Food in the Last Year: Never true    Ran Out of Food in the Last Year: Never true  Transportation Needs: No Transportation Needs (10/06/2022)   PRAPARE - Administrator, Civil Service (Medical): No    Lack of Transportation (Non-Medical): No  Physical Activity: Not on file  Stress: Not on file  Social Connections: Unknown (06/19/2021)   Received from [facility]   Social Network    Social Network: Not on file  Intimate Partner Violence: Not At Risk (10/06/2022)   Humiliation, Afraid, Rape, and Kick questionnaire    Fear of Current or Ex-Partner: No    Emotionally Abused: No    Physically Abused: No    Sexually Abused: No   Family Status  Relation Name Status   Father  Deceased   Mother  Alive   MGM  (Not Specified)  No partnership data on file   Family History  Problem Relation Age of Onset   Heart disease Father 55       MI   Hypertension Father    Sudden death Father        Heart disease   Hyperlipidemia Father    Obesity Father    Diabetes Mother        Borderline   Hyperlipidemia Mother    Hypertension Mother    Depression Mother    Obesity Mother    Breast cancer Maternal Grandmother    No Known Allergies  Review of Systems  Constitutional:  Negative for fever and malaise/fatigue.  HENT:  Negative for congestion.    Eyes:  Negative for blurred vision.  Respiratory:  Negative for cough and shortness of breath.   Cardiovascular:  Negative for chest pain, palpitations and leg swelling.  Gastrointestinal:  Negative for abdominal pain, blood in stool, nausea and vomiting.  Genitourinary:  Negative for dysuria and frequency.  Musculoskeletal:  Negative for back pain and falls.  Skin:  Negative for rash.  Neurological:  Negative for dizziness, loss of consciousness and headaches.  Endo/Heme/Allergies:  Negative for environmental allergies.  Psychiatric/Behavioral:  Negative for depression. The patient is not nervous/anxious.       Objective:     BP 118/88 (BP  Location: Left Arm, Patient Position: Sitting, Cuff Size: Large)   Pulse 73   Temp 98.9 F (37.2 C) (Oral)   Resp 18   Ht 5\' 9"  (1.753 m)   Wt (!) 350 lb 3.2 oz (158.8 kg)   SpO2 98%   BMI 51.72 kg/m  BP Readings from Last 3 Encounters:  06/04/23 118/88  03/06/23 128/70  10/07/22 126/69   Wt Readings from Last 3 Encounters:  06/04/23 (!) 350 lb 3.2 oz (158.8 kg)  03/06/23 (!) 353 lb 3.2 oz (160.2 kg)  10/06/22 (!) 317 lb (143.8 kg)   SpO2 Readings from Last 3 Encounters:  06/04/23 98%  03/06/23 97%  10/07/22 97%      Physical Exam Vitals and nursing note reviewed.  Constitutional:      General: He is not in acute distress.    Appearance: Normal appearance. He is well-developed.  HENT:     Head: Normocephalic and atraumatic.  Eyes:     General: No scleral icterus.       Right eye: No discharge.        Left eye: No discharge.  Cardiovascular:     Rate and Rhythm: Normal rate and regular rhythm.     Heart sounds: No murmur heard. Pulmonary:     Effort: Pulmonary effort is normal. No respiratory distress.     Breath sounds: Normal breath sounds.  Musculoskeletal:        General: Normal range of motion.     Cervical back: Normal range of motion and neck supple.     Right lower leg: No edema.     Left lower leg: No  edema.  Skin:    General: Skin is warm and dry.  Neurological:     Mental Status: He is alert and oriented to person, place, and time.  Psychiatric:        Mood and Affect: Mood normal.        Behavior: Behavior normal.        Thought Content: Thought content normal.        Judgment: Judgment normal.      No results found for any visits on 06/04/23.  Last CBC Lab Results  Component Value Date   WBC 8.9 03/06/2023   HGB 14.5 03/06/2023   HCT 42.2 03/06/2023   MCV 84.2 03/06/2023   MCH 29.1 10/07/2022   RDW 13.4 03/06/2023   PLT 276.0 03/06/2023   Last metabolic panel Lab Results  Component Value Date   GLUCOSE 88 03/06/2023   NA 138 03/06/2023   K 3.1 (L) 03/06/2023   CL 100 03/06/2023   CO2 30 03/06/2023   BUN 16 03/06/2023   CREATININE 1.05 03/06/2023   GFR 84.13 03/06/2023   CALCIUM  9.5 03/06/2023   PROT 6.7 03/06/2023   ALBUMIN 4.3 03/06/2023   LABGLOB 2.7 03/14/2018   AGRATIO 1.6 03/14/2018   BILITOT 0.7 03/06/2023   ALKPHOS 61 03/06/2023   AST 29 03/06/2023   ALT 28 03/06/2023   ANIONGAP 11 10/07/2022   Last lipids Lab Results  Component Value Date   CHOL 154 03/06/2023   HDL 36.00 (L) 03/06/2023   LDLCALC 100 (H) 03/06/2023   LDLDIRECT 107.0 08/05/2019   TRIG 88.0 03/06/2023   CHOLHDL 4 03/06/2023   Last hemoglobin A1c Lab Results  Component Value Date   HGBA1C 5.9 03/06/2023   Last thyroid functions Lab Results  Component Value Date   TSH 2.92 05/30/2022   T3TOTAL 113 03/14/2018   Last  vitamin D  Lab Results  Component Value Date   VD25OH 54.65 05/30/2022   Last vitamin B12 and Folate Lab Results  Component Value Date   VITAMINB12 557 12/16/2020   FOLATE 10.9 09/05/2016      The 10-year ASCVD risk score (Arnett DK, et al., 2019) is: 5.3%    Assessment & Plan:   Problem List Items Addressed This Visit       Unprioritized   Morbid obesity (HCC)   Other Visit Diagnoses       Type 2 diabetes mellitus with hyperglycemia,  without long-term current use of insulin  (HCC)          Assessment and Plan Assessment & Plan Overweight   He experiences mental and physical fatigue due to work stress, which affects his weight management efforts. He acknowledges occasional overeating followed by guilt. Currently, he is on Mounjaro  for appetite suppression but is concerned about potential weight regain. He is attempting to re-establish a routine of exercise and healthy eating, focusing on protein, healthy fats, and vegetables. Emphasize dietary focus on these elements. Advise moderation with red meat and include grilled meats and fish. Reinforce the importance of maintaining a routine for exercise and healthy eating.  Hypokalemia   His potassium levels were slightly low in January's blood work. No acute symptoms or concerns related to hypokalemia were present during this visit. Monitor potassium levels in the next scheduled blood work in July.   No follow-ups on file.    Owain Eckerman R Lowne Chase, DO

## 2023-06-25 ENCOUNTER — Ambulatory Visit: Payer: 59 | Admitting: Orthopaedic Surgery

## 2023-07-30 ENCOUNTER — Encounter: Payer: Self-pay | Admitting: Family Medicine

## 2023-07-30 DIAGNOSIS — E1165 Type 2 diabetes mellitus with hyperglycemia: Secondary | ICD-10-CM

## 2023-07-30 DIAGNOSIS — I10 Essential (primary) hypertension: Secondary | ICD-10-CM

## 2023-07-30 DIAGNOSIS — E785 Hyperlipidemia, unspecified: Secondary | ICD-10-CM

## 2023-07-31 ENCOUNTER — Telehealth: Payer: Self-pay

## 2023-07-31 MED ORDER — ATORVASTATIN CALCIUM 40 MG PO TABS
40.0000 mg | ORAL_TABLET | Freq: Every day | ORAL | 0 refills | Status: DC
Start: 2023-07-31 — End: 2023-10-29

## 2023-07-31 MED ORDER — METOPROLOL SUCCINATE ER 100 MG PO TB24: ORAL_TABLET | ORAL | 0 refills | Status: DC

## 2023-07-31 MED ORDER — FENOFIBRATE 160 MG PO TABS: 160.0000 mg | ORAL_TABLET | Freq: Every day | ORAL | 0 refills | Status: DC

## 2023-07-31 MED ORDER — TIRZEPATIDE 15 MG/0.5ML ~~LOC~~ SOAJ: 15.0000 mg | SUBCUTANEOUS | 0 refills | Status: DC

## 2023-07-31 MED ORDER — CHLORTHALIDONE 25 MG PO TABS: 25.0000 mg | ORAL_TABLET | Freq: Every day | ORAL | 0 refills | Status: DC

## 2023-07-31 NOTE — Telephone Encounter (Signed)
 Pharmacy Patient Advocate Encounter   Received notification from CoverMyMeds that prior authorization for Mounjaro  15MG /0.5ML auto-injectors is required/requested.   Insurance verification completed.   The patient is insured through [employer] .   Per test claim: PA required; PA started via CoverMyMeds. KEY [id number] . Waiting for clinical questions to populate.  PRIOR AUTH EOC ID [id number]

## 2023-08-01 ENCOUNTER — Other Ambulatory Visit (HOSPITAL_COMMUNITY): Payer: Self-pay

## 2023-08-01 NOTE — Telephone Encounter (Signed)
 Ozempic /Mounjaro  is approved exclusively as an adjunct to diet and exercise to improve glycemic  control in adults with type 2 diabetes mellitus. A review of patient's medical chart reveals NO  DOCUMENTED DIAGNOSIS OF TYPE 2 DIABETES OR AN A1C 6.5 OR GREATER indicative of diabetes. Therefore, they do not  currently meet the criteria for prior authorization of this medication. If clinically appropriate, alternative  options such as Saxenda , Zepbound , or Georjean may be considered for this   Prior Authorization form/request asks a question that requires your assistance. Please see the question below and advise accordingly. The PA will not be submitted until the necessary information is received.

## 2023-08-03 ENCOUNTER — Other Ambulatory Visit (HOSPITAL_COMMUNITY): Payer: Self-pay

## 2023-08-07 ENCOUNTER — Other Ambulatory Visit (HOSPITAL_COMMUNITY): Payer: Self-pay

## 2023-09-03 ENCOUNTER — Ambulatory Visit: Payer: Self-pay | Admitting: Family Medicine

## 2023-10-27 ENCOUNTER — Other Ambulatory Visit: Payer: Self-pay | Admitting: Family Medicine

## 2023-10-27 DIAGNOSIS — E785 Hyperlipidemia, unspecified: Secondary | ICD-10-CM

## 2023-11-10 ENCOUNTER — Other Ambulatory Visit: Payer: Self-pay | Admitting: Family Medicine

## 2023-11-10 DIAGNOSIS — I10 Essential (primary) hypertension: Secondary | ICD-10-CM

## 2023-12-10 ENCOUNTER — Encounter: Payer: Self-pay | Admitting: Radiology

## 2023-12-13 ENCOUNTER — Other Ambulatory Visit: Payer: Self-pay | Admitting: Family Medicine

## 2023-12-13 DIAGNOSIS — I10 Essential (primary) hypertension: Secondary | ICD-10-CM

## 2023-12-27 ENCOUNTER — Other Ambulatory Visit: Payer: Self-pay | Admitting: Family Medicine

## 2023-12-27 DIAGNOSIS — E785 Hyperlipidemia, unspecified: Secondary | ICD-10-CM

## 2024-01-14 ENCOUNTER — Other Ambulatory Visit: Payer: Self-pay

## 2024-01-14 DIAGNOSIS — I10 Essential (primary) hypertension: Secondary | ICD-10-CM

## 2024-01-14 MED ORDER — CHLORTHALIDONE 25 MG PO TABS: 25.0000 mg | ORAL_TABLET | Freq: Every day | ORAL | 0 refills | Status: DC

## 2024-01-18 ENCOUNTER — Ambulatory Visit: Payer: Self-pay

## 2024-01-18 ENCOUNTER — Ambulatory Visit: Payer: Self-pay | Admitting: Family Medicine

## 2024-01-18 ENCOUNTER — Encounter: Payer: Self-pay | Admitting: Family Medicine

## 2024-01-18 VITALS — BP 132/80 | HR 82 | Temp 98.3°F | Resp 18 | Ht 69.0 in | Wt 381.0 lb

## 2024-01-18 DIAGNOSIS — E559 Vitamin D deficiency, unspecified: Secondary | ICD-10-CM

## 2024-01-18 DIAGNOSIS — E66813 Obesity, class 3: Secondary | ICD-10-CM

## 2024-01-18 DIAGNOSIS — E1165 Type 2 diabetes mellitus with hyperglycemia: Secondary | ICD-10-CM

## 2024-01-18 DIAGNOSIS — Z23 Encounter for immunization: Secondary | ICD-10-CM

## 2024-01-18 DIAGNOSIS — L720 Epidermal cyst: Secondary | ICD-10-CM

## 2024-01-18 DIAGNOSIS — E785 Hyperlipidemia, unspecified: Secondary | ICD-10-CM

## 2024-01-18 DIAGNOSIS — I10 Essential (primary) hypertension: Secondary | ICD-10-CM

## 2024-01-18 DIAGNOSIS — Z1211 Encounter for screening for malignant neoplasm of colon: Secondary | ICD-10-CM

## 2024-01-18 LAB — COMPREHENSIVE METABOLIC PANEL WITH GFR
ALT: 25 U/L (ref 0–53)
AST: 22 U/L (ref 0–37)
Albumin: 4.3 g/dL (ref 3.5–5.2)
Alkaline Phosphatase: 55 U/L (ref 39–117)
BUN: 18 mg/dL (ref 6–23)
CO2: 32 meq/L (ref 19–32)
Calcium: 9.6 mg/dL (ref 8.4–10.5)
Chloride: 103 meq/L (ref 96–112)
Creatinine, Ser: 0.95 mg/dL (ref 0.40–1.50)
GFR: 94.29 mL/min (ref >60.00)
Glucose, Bld: 86 mg/dL (ref 70–99)
Potassium: 3.8 meq/L (ref 3.5–5.1)
Sodium: 141 meq/L (ref 135–145)
Total Bilirubin: 0.6 mg/dL (ref 0.2–1.2)
Total Protein: 6.8 g/dL (ref 6.0–8.3)

## 2024-01-18 LAB — CBC WITH DIFFERENTIAL/PLATELET
Basophils Absolute: 0.1 K/uL (ref 0.0–0.1)
Basophils Relative: 0.8 % (ref 0.0–3.0)
Eosinophils Absolute: 0.5 K/uL (ref 0.0–0.7)
Eosinophils Relative: 6.1 % — ABNORMAL HIGH (ref 0.0–5.0)
HCT: 45.1 % (ref 39.0–52.0)
Hemoglobin: 15.3 g/dL (ref 13.0–17.0)
Lymphocytes Relative: 33.1 % (ref 12.0–46.0)
Lymphs Abs: 2.8 K/uL (ref 0.7–4.0)
MCHC: 33.8 g/dL (ref 30.0–36.0)
MCV: 86.6 fl (ref 78.0–100.0)
Monocytes Absolute: 0.8 K/uL (ref 0.1–1.0)
Monocytes Relative: 9.2 % (ref 3.0–12.0)
Neutro Abs: 4.3 K/uL (ref 1.4–7.7)
Neutrophils Relative %: 50.8 % (ref 43.0–77.0)
Platelets: 249 K/uL (ref 150.0–400.0)
RBC: 5.21 Mil/uL (ref 4.22–5.81)
RDW: 13.6 % (ref 11.5–15.5)
WBC: 8.4 K/uL (ref 4.0–10.5)

## 2024-01-18 LAB — LIPID PANEL
Cholesterol: 151 mg/dL (ref 0–200)
HDL: 44.1 mg/dL (ref >39.00)
LDL Cholesterol: 93 mg/dL (ref 0–99)
NonHDL: 106.43
Total CHOL/HDL Ratio: 3
Triglycerides: 66 mg/dL (ref 0.0–149.0)
VLDL: 13.2 mg/dL (ref 0.0–40.0)

## 2024-01-18 LAB — HEMOGLOBIN A1C: Hgb A1c MFr Bld: 5.5 % (ref 4.6–6.5)

## 2024-01-18 MED ORDER — VITAMIN D (ERGOCALCIFEROL) 1.25 MG (50000 UNIT) PO CAPS: 50000.0000 [IU] | ORAL_CAPSULE | ORAL | 2 refills | Status: AC

## 2024-01-18 MED ORDER — FENOFIBRATE 160 MG PO TABS: 160.0000 mg | ORAL_TABLET | Freq: Every day | ORAL | 1 refills | Status: AC

## 2024-01-18 MED ORDER — CHLORTHALIDONE 25 MG PO TABS: 25.0000 mg | ORAL_TABLET | Freq: Every day | ORAL | 1 refills | Status: AC

## 2024-01-18 MED ORDER — TIRZEPATIDE 5 MG/0.5ML ~~LOC~~ SOAJ: 5.0000 mg | SUBCUTANEOUS | 0 refills | Status: DC

## 2024-01-18 MED ORDER — ATORVASTATIN CALCIUM 40 MG PO TABS: 40.0000 mg | ORAL_TABLET | Freq: Every day | ORAL | 1 refills | Status: AC

## 2024-01-18 MED ORDER — METOPROLOL SUCCINATE ER 100 MG PO TB24: ORAL_TABLET | ORAL | 1 refills | Status: AC

## 2024-01-18 NOTE — Assessment & Plan Note (Signed)
Restart mounjaro

## 2024-01-18 NOTE — Assessment & Plan Note (Signed)
 Start back on mounjaro  Encouraged exercise and high protein diet  F/u 3 months

## 2024-01-18 NOTE — Assessment & Plan Note (Signed)
 Encourage heart healthy diet such as MIND or DASH diet, increase exercise, avoid trans fats, simple carbohydrates and processed foods, consider a krill or fish or flaxseed oil cap daily.

## 2024-01-18 NOTE — Patient Instructions (Signed)
 Obesity, Adult Obesity is the condition of having too much total body fat. Being overweight or obese means that your weight is greater than what is considered healthy for your body size. Obesity is determined by a measurement called BMI (body mass index). BMI is an estimate of body fat and is calculated from height and weight. For adults, a BMI of 30 or higher is considered obese. Obesity can lead to other health concerns and major illnesses, including: Stroke. Coronary artery disease (CAD). Type 2 diabetes. Some types of cancer, including cancers of the colon, breast, uterus, and gallbladder. High blood pressure (hypertension). High cholesterol. Gallbladder stones. Obesity can also contribute to: Osteoarthritis. Sleep apnea. Infertility problems. What are the causes? Common causes of this condition include: Eating daily meals that are high in calories, sugar, and fat. Drinking high amounts of sugar-sweetened beverages, such as soft drinks. Being born with genes that may make you more likely to become obese. Having a medical condition that causes obesity, including: Hypothyroidism. Polycystic ovarian syndrome (PCOS). Binge-eating disorder. Cushing syndrome. Taking certain medicines, such as steroids, antidepressants, and seizure medicines. Not being physically active (sedentary lifestyle). Not getting enough sleep. What increases the risk? The following factors may make you more likely to develop this condition: Having a family history of obesity. Living in an area with limited access to: [place], recreation centers, or sidewalks. Healthy food choices, such as grocery stores and farmers' markets. What are the signs or symptoms? The main sign of this condition is having too much body fat. How is this diagnosed? This condition is diagnosed based on: Your BMI. If you are an adult with a BMI of 30 or higher, you are considered obese. Your waist circumference. This measures the  distance around your waistline. Your skinfold thickness. Your health care provider may gently pinch a fold of your skin and measure it. You may have other tests to check for underlying conditions. How is this treated? Treatment for this condition often includes changing your lifestyle. Treatment may include some or all of the following: Dietary changes. This may include developing a healthy meal plan. Regular physical activity. This may include activity that causes your heart to beat faster (aerobic exercise) and strength training. Work with your health care provider to design an exercise program that works for you. Medicine to help you lose weight if you are unable to lose one pound a week after six weeks of healthy eating and more physical activity. Treating conditions that cause the obesity (underlying conditions). Surgery. Surgical options may include gastric banding and gastric bypass. Surgery may be done if: Other treatments have not helped to improve your condition. You have a BMI of 40 or higher. You have life-threatening health problems related to obesity. Follow these instructions at home: Eating and drinking  Follow recommendations from your health care provider about what you eat and drink. Your health care provider may advise you to: Limit fast food, sweets, and processed snack foods. Choose low-fat options, such as low-fat milk instead of whole milk. Eat five or more servings of fruits or vegetables every day. Choose healthy foods when you eat out. Keep low-fat snacks available. Limit sugary drinks, such as soda, fruit juice, sweetened iced tea, and flavored milk. Drink enough water to keep your urine pale yellow. Do not follow a fad diet. Fad diets can be unhealthy and even dangerous. Other healthful choices include: Eat at home more often. This gives you more control over what you eat. Learn to read food labels.  This will help you understand how much food is considered one  serving. Learn what a healthy serving size is. Physical activity Exercise regularly, as told by your health care provider. Most adults should get up to 150 minutes of moderate-intensity exercise every week. Ask your health care provider what types of exercise are safe for you and how often you should exercise. Warm up and stretch before being active. Cool down and stretch after being active. Rest between periods of activity. Lifestyle Work with your health care provider and a dietitian to set a weight-loss goal that is healthy and reasonable for you. Limit your screen time. Find ways to reward yourself that do not involve food. Do not drink alcohol if: Your health care provider tells you not to drink. You are pregnant, may be pregnant, or are planning to become pregnant. If you drink alcohol: Limit how much you have to: 0-1 drink a day for women. 0-2 drinks a day for men. Know how much alcohol is in your drink. In the [place] one drink equals one 12 oz bottle of beer (355 mL), one 5 oz glass of wine (148 mL), or one 1 oz glass of hard liquor (44 mL). General instructions Keep a weight-loss journal to keep track of the food you eat and how much exercise you get. Take over-the-counter and prescription medicines only as told by your health care provider. Take vitamins and supplements only as told by your health care provider. Consider joining a support group. Your health care provider may be able to recommend a support group. Pay attention to your mental health as obesity can lead to depression or self esteem issues. Keep all follow-up visits. This is important. Contact a health care provider if: You are unable to meet your weight-loss goal after six weeks of dietary and lifestyle changes. You have trouble breathing. Summary Obesity is the condition of having too much total body fat. Being overweight or obese means that your weight is greater than what is considered healthy for your body  size. Work with your health care provider and a dietitian to set a weight-loss goal that is healthy and reasonable for you. Exercise regularly, as told by your health care provider. Ask your health care provider what types of exercise are safe for you and how often you should exercise. This information is not intended to replace advice given to you by your health care provider. Make sure you discuss any questions you have with your health care provider. Document Revised: 08/31/2020 Document Reviewed: 08/31/2020 Elsevier Patient Education  2024 [employer].

## 2024-01-18 NOTE — Assessment & Plan Note (Signed)
 hgba1c to be checked, minimize simple carbs. Increase exercise as tolerated. Continue current meds

## 2024-01-18 NOTE — Progress Notes (Signed)
 Subjective:    Patient ID: Sean [name], male    DOB: 1976/01/21, 48 y.o.   MRN: [id number]  Chief Complaint  Patient presents with   Hyperlipidemia   Hypertension   Weight Check    HPI Patient is in today for f/u weight, chol and bp.  Discussed the use of AI scribe software for clinical note transcription with the patient, who gave verbal consent to proceed.  History of Present Illness Sean [name] is a 48 year old male who presents for weight management and evaluation of scalp cysts.  He is concerned about significant weight gain following a challenging year and hip replacement surgery. He previously used Mounjaro  injections but found them less effective at higher doses and discontinued use after surgery. He is interested in resuming treatment to aid weight loss, aiming to reach a weight of 275-300 pounds. He feels okay but acknowledges letting his health focus lapse.  He has multiple cyst-like bumps on his scalp for several years, which are discomforting when cutting his hair but not painful. He describes them as 'fairly good size' and notes they do not seem to change in size.  He experiences occasional swelling in his legs, noticeable when wearing socks, but not pitting edema. He is considering increasing water  intake and using compression socks to manage this.  He reports numbness and tingling in his fingers, particularly on one side, which occurs sporadically and resolves after about 10-15 minutes. He suspects it may be related to his sleeping position.  He has a history of hypertension and is currently managed with medications including chlorthalidone , fenofibrate , metoprolol , and atorvastatin . He recently received a flu shot and is considering vitamin D  supplementation.  He has a history of hip replacement surgery performed by Dr. Vernetta, which he reports went well and has allowed him to remain active and on his feet frequently.    Past Medical History:   Diagnosis Date   Arthritis    Heartburn    Hyperlipidemia    Hypertension    Pre-diabetes    Sleep apnea    SOB (shortness of breath) on exertion    Swelling    feet and legs    Past Surgical History:  Procedure Laterality Date   NO PAST SURGERIES     TOTAL HIP ARTHROPLASTY Right 10/06/2022   Procedure: RIGHT TOTAL HIP ARTHROPLASTY ANTERIOR APPROACH;  Surgeon: Vernetta Lonni GRADE, MD;  Location: [facility];  Service: Orthopedics;  Laterality: Right;    Family History  Problem Relation Age of Onset   Heart disease Father 66       MI   Hypertension Father    Sudden death Father        Heart disease   Hyperlipidemia Father    Obesity Father    Diabetes Mother        Borderline   Hyperlipidemia Mother    Hypertension Mother    Depression Mother    Obesity Mother    Breast cancer Maternal Grandmother     Social History   Socioeconomic History   Marital status: Married    Spouse name: Not on file   Number of children: Not on file   Years of education: Not on file   Highest education level: Not on file  Occupational History   Occupation: Dietitian  Tobacco Use   Smoking status: Never   Smokeless tobacco: Never  Vaping Use   Vaping status: Never  Used  Substance and Sexual Activity   Alcohol use: Yes    Comment: occas.   Drug use: No   Sexual activity: Yes  Other Topics Concern   Not on file  Social History Narrative   Not on file   Social Drivers of Health   Tobacco Use: Low Risk (01/18/2024)   Patient History    Smoking Tobacco Use: Never    Smokeless Tobacco Use: Never    Passive Exposure: Not on file  Financial Resource Strain: Not on file  Food Insecurity: No Food Insecurity (10/06/2022)   Hunger Vital Sign    Worried About Running Out of Food in the Last Year: Never true    Ran Out of Food in the Last Year: Never true  Transportation Needs: No Transportation Needs (10/06/2022)   PRAPARE - Administrator, Civil Service  (Medical): No    Lack of Transportation (Non-Medical): No  Physical Activity: Not on file  Stress: Not on file  Social Connections: Unknown (06/19/2021)   [facility]   Social Network    Social Network: Not on file  Intimate Partner Violence: Not At Risk (10/06/2022)   Humiliation, Afraid, Rape, and Kick questionnaire    Fear of Current or Ex-Partner: No    Emotionally Abused: No    Physically Abused: No    Sexually Abused: No  Depression (PHQ2-9): Low Risk (01/18/2024)   Depression (PHQ2-9)    PHQ-2 Score: 4  Alcohol Screen: Not on file  Housing: Low Risk (10/06/2022)   Housing    Last Housing Risk Score: 0  Utilities: Not At Risk (10/06/2022)   AHC Utilities    Threatened with loss of utilities: No  Health Literacy: Not on file    Outpatient Medications Prior to Visit  Medication Sig Dispense Refill   Ascorbic Acid  (VITAMIN C ) 1000 MG tablet Take 1,000 mg by mouth daily.     ibuprofen  (ADVIL ) 200 MG tablet Take 800 mg by mouth every 8 (eight) hours as needed for moderate pain.     Misc Natural Products (GLUCOSAMINE CHOND MSM FORMULA PO) Take 1 tablet by mouth daily. With Potassium     Multiple Vitamins-Minerals (MULTIVITAMIN WITH MINERALS) tablet Take 1 tablet by mouth daily.     NONFORMULARY OR COMPOUNDED ITEM Compression socks #1  As directed   Dx low ext edema 1 each 0   Tart Cherry 1200 MG CAPS Take 1,200 mg by mouth daily.     tirzepatide  (MOUNJARO ) 15 MG/0.5ML Pen Inject 15 mg into the skin once a week. 6 mL 0   TURMERIC CURCUMIN PO Take 1 capsule by mouth daily. 1500 mg     atorvastatin  (LIPITOR) 40 MG tablet Take 1 tablet by mouth once daily 60 tablet 0   chlorthalidone  (HYGROTON ) 25 MG tablet Take 1 tablet (25 mg total) by mouth daily. Needs appt 30 tablet 0   fenofibrate  160 MG tablet Take 1 tablet by mouth once daily 60 tablet 0   metoprolol  succinate (TOPROL -XL) 100 MG 24 hr tablet 1 1/2 po every day 135 tablet 0   Vitamin D , Ergocalciferol ,  (DRISDOL ) 1.25 MG (50000 UNIT) CAPS capsule Take 1 capsule (50,000 Units total) by mouth once a week. 12 capsule 2   aspirin  81 MG chewable tablet Chew 1 tablet (81 mg total) by mouth 2 (two) times daily. (Patient not taking: Reported on 06/04/2023) 30 tablet 0   blood glucose meter kit and supplies KIT Dispense based on patient and insurance  preference. Use up to four times daily as directed. (FOR ICD-9 250.00, 250.01). (Patient not taking: Reported on 12/07/2022) 1 each 0   gabapentin  (NEURONTIN ) 300 MG capsule Take 1 to 2 tablets in the evening to help with hip pain. (Patient not taking: Reported on 12/07/2022) 60 capsule 2   No facility-administered medications prior to visit.    Allergies[1]  Review of Systems  Constitutional:  Negative for fever and malaise/fatigue.  HENT:  Negative for congestion.   Eyes:  Negative for blurred vision.  Respiratory:  Negative for shortness of breath.   Cardiovascular:  Negative for chest pain, palpitations and leg swelling.  Gastrointestinal:  Negative for abdominal pain, blood in stool and nausea.  Genitourinary:  Negative for dysuria and frequency.  Musculoskeletal:  Negative for falls.  Skin:  Negative for rash.  Neurological:  Negative for dizziness, loss of consciousness and headaches.  Endo/Heme/Allergies:  Negative for environmental allergies.  Psychiatric/Behavioral:  Negative for depression. The patient is not nervous/anxious.        Objective:    Physical Exam Vitals and nursing note reviewed.  Constitutional:      General: He is not in acute distress.    Appearance: Normal appearance. He is well-developed.  HENT:     Head: Normocephalic and atraumatic.  Eyes:     General: No scleral icterus.       Right eye: No discharge.        Left eye: No discharge.  Cardiovascular:     Rate and Rhythm: Normal rate and regular rhythm.     Heart sounds: No murmur heard. Pulmonary:     Effort: Pulmonary effort is normal. No respiratory  distress.     Breath sounds: Normal breath sounds.  Musculoskeletal:        General: Normal range of motion.     Cervical back: Normal range of motion and neck supple.     Right lower leg: No edema.     Left lower leg: No edema.  Skin:    General: Skin is warm and dry.  Neurological:     Mental Status: He is alert and oriented to person, place, and time.  Psychiatric:        Mood and Affect: Mood normal.        Behavior: Behavior normal.        Thought Content: Thought content normal.        Judgment: Judgment normal.     BP 132/80 (BP Location: Left Arm, Patient Position: Sitting, Cuff Size: Large)   Pulse 82   Temp 98.3 F (36.8 C) (Oral)   Resp 18   Ht 5' 9 (1.753 m)   Wt (!) 381 lb (172.8 kg)   SpO2 98%   BMI 56.26 kg/m  Wt Readings from Last 3 Encounters:  01/18/24 (!) 381 lb (172.8 kg)  06/04/23 (!) 350 lb 3.2 oz (158.8 kg)  03/06/23 (!) 353 lb 3.2 oz (160.2 kg)    Diabetic Foot Exam - Simple   No data filed    Lab Results  Component Value Date   WBC 8.9 03/06/2023   HGB 14.5 03/06/2023   HCT 42.2 03/06/2023   PLT 276.0 03/06/2023   GLUCOSE 88 03/06/2023   CHOL 154 03/06/2023   TRIG 88.0 03/06/2023   HDL 36.00 (L) 03/06/2023   LDLDIRECT 107.0 08/05/2019   LDLCALC 100 (H) 03/06/2023   ALT 28 03/06/2023   AST 29 03/06/2023   NA 138 03/06/2023   K 3.1 (L)  03/06/2023   CL 100 03/06/2023   CREATININE 1.05 03/06/2023   BUN 16 03/06/2023   CO2 30 03/06/2023   TSH 2.92 05/30/2022   HGBA1C 5.9 03/06/2023   MICROALBUR 0.8 11/25/2019    Lab Results  Component Value Date   TSH 2.92 05/30/2022   Lab Results  Component Value Date   WBC 8.9 03/06/2023   HGB 14.5 03/06/2023   HCT 42.2 03/06/2023   MCV 84.2 03/06/2023   PLT 276.0 03/06/2023   Lab Results  Component Value Date   NA 138 03/06/2023   K 3.1 (L) 03/06/2023   CO2 30 03/06/2023   GLUCOSE 88 03/06/2023   BUN 16 03/06/2023   CREATININE 1.05 03/06/2023   BILITOT 0.7 03/06/2023    ALKPHOS 61 03/06/2023   AST 29 03/06/2023   ALT 28 03/06/2023   PROT 6.7 03/06/2023   ALBUMIN 4.3 03/06/2023   CALCIUM  9.5 03/06/2023   ANIONGAP 11 10/07/2022   GFR 84.13 03/06/2023   Lab Results  Component Value Date   CHOL 154 03/06/2023   Lab Results  Component Value Date   HDL 36.00 (L) 03/06/2023   Lab Results  Component Value Date   LDLCALC 100 (H) 03/06/2023   Lab Results  Component Value Date   TRIG 88.0 03/06/2023   Lab Results  Component Value Date   CHOLHDL 4 03/06/2023   Lab Results  Component Value Date   HGBA1C 5.9 03/06/2023       Assessment & Plan:  Need for influenza vaccination -     Flu vaccine trivalent PF, 6mos and older(Flulaval,Afluria,Fluarix,Fluzone)  Essential hypertension -     CBC with Differential/Platelet -     Chlorthalidone ; Take 1 tablet (25 mg total) by mouth daily. Needs appt  Dispense: 90 tablet; Refill: 1 -     Metoprolol  Succinate ER; 1 1/2 po every day  Dispense: 135 tablet; Refill: 1  Hyperlipidemia, unspecified hyperlipidemia type Assessment & Plan: Encourage heart healthy diet such as MIND or DASH diet, increase exercise, avoid trans fats, simple carbohydrates and processed foods, consider a krill or fish or flaxseed oil cap daily.  .     Orders: -     Lipid panel -     Comprehensive metabolic panel with GFR -     Atorvastatin  Calcium ; Take 1 tablet (40 mg total) by mouth daily.  Dispense: 90 tablet; Refill: 1 -     Fenofibrate ; Take 1 tablet (160 mg total) by mouth daily.  Dispense: 90 tablet; Refill: 1  Morbid obesity (HCC)  Type 2 diabetes mellitus with hyperglycemia, without long-term current use of insulin  (HCC) -     Hemoglobin A1c -     Ambulatory referral to Ophthalmology -     Tirzepatide ; Inject 5 mg into the skin once a week.  Dispense: 6 mL; Refill: 0 -     Microalbumin / creatinine urine ratio; Future  Primary hypertension -     Lipid panel -     CBC with Differential/Platelet -      Comprehensive metabolic panel with GFR  Severe obesity (BMI >= 40) (HCC) Assessment & Plan: Start back on mounjaro  Encouraged exercise and high protein diet  F/u 3 months  Orders: -     Lipid panel -     CBC with Differential/Platelet -     Comprehensive metabolic panel with GFR -     Hemoglobin A1c -     Microalbumin / creatinine urine ratio; Future  Epidermoid cyst of  skin of scalp -     Ambulatory referral to Dermatology  Vitamin D  deficiency -     Vitamin D  (Ergocalciferol ); Take 1 capsule (50,000 Units total) by mouth once a week.  Dispense: 12 capsule; Refill: 2  Colon cancer screening -     Ambulatory referral to Gastroenterology  Uncontrolled type 2 diabetes mellitus with hyperglycemia (HCC) Assessment & Plan: hgba1c to be checked, minimize simple carbs. Increase exercise as tolerated. Continue current meds    Class 3 severe obesity with serious comorbidity and body mass index (BMI) of 50.0 to 59.9 in adult, unspecified obesity type [facility]) Assessment & Plan: Restart mounjaro     Assessment and Plan Assessment & Plan Morbid obesity   He has experienced recent weight gain and was previously on the maximum dosage of Mounjaro . He is interested in resuming weight management medication. We discussed potential new medications and the importance of diet and exercise, emphasizing protein intake to prevent muscle loss during weight loss. Started Mounjaro  at 5 mg weekly. Encouraged dietary changes focusing on protein intake and healthy fats, along with regular exercise.  Type 2 diabetes mellitus with hyperglycemia   His type 2 diabetes is accompanied by hyperglycemia. We discussed Mounjaro 's role in managing blood glucose levels and the importance of regular monitoring. Emphasized the need for an ophthalmologist to monitor for diabetic retinopathy. Started Mounjaro  at 5 mg weekly and referred to an ophthalmologist for diabetic retinopathy screening.  Essential hypertension    His hypertension is managed with chlorthalidone  and metoprolol . We discussed medication refills and adherence. Refilled chlorthalidone  and metoprolol  prescriptions.  Hyperlipidemia   His hyperlipidemia is managed with atorvastatin  and fenofibrate . We discussed medication refills and adherence. Refilled atorvastatin  and fenofibrate  prescriptions.  Vitamin D  deficiency   He has a vitamin D  deficiency. We discussed the importance of supplementation and potential low levels. Prescribed vitamin D  supplementation and recommended over-the-counter vitamin D3, 1000 units daily.  Epidermoid cysts of scalp   He has epidermoid cysts on the scalp, present for years, causing discomfort but not pain. Referred to dermatology for further evaluation and management.  General health maintenance   We discussed general health maintenance, including flu vaccination and colonoscopy screening, emphasizing the importance of regular screenings and vaccinations. Administered flu vaccine and referred for colonoscopy screening.    Willeen Novak R Lowne Chase, DO     [1] No Known Allergies

## 2024-01-21 ENCOUNTER — Encounter: Payer: Self-pay | Admitting: Family Medicine

## 2024-01-21 MED ORDER — TIRZEPATIDE 7.5 MG/0.5ML ~~LOC~~ SOAJ: 7.5000 mg | SUBCUTANEOUS | 0 refills | Status: AC

## 2024-02-22 ENCOUNTER — Telehealth: Payer: Self-pay

## 2024-02-22 NOTE — Telephone Encounter (Signed)
 PA initiated via [email or web address], ID: [id number]. Awaiting determination.

## 2024-02-26 NOTE — Telephone Encounter (Signed)
 I tried checking status- I'm unable to pull up the status. Can you guys try running a test claim for me please?

## 2024-03-03 ENCOUNTER — Other Ambulatory Visit (HOSPITAL_COMMUNITY): Payer: Self-pay

## 2024-03-03 ENCOUNTER — Telehealth: Payer: Self-pay

## 2024-03-03 NOTE — Telephone Encounter (Signed)
 Pharmacy Patient Advocate Encounter  Insurance verification completed.   The patient is insured through [employer] test claim for Mounjaro . Currently a quantity of 2 is a 28 day supply and the co-pay is $0 .   This test claim was processed through [facility]- copay amounts may vary at other pharmacies due to pharmacy/plan contracts, or as the patient moves through the different stages of their insurance plan.

## 2024-03-10 ENCOUNTER — Encounter: Payer: Self-pay | Admitting: Gastroenterology

## 2024-04-18 ENCOUNTER — Ambulatory Visit: Admitting: Family Medicine

## 2024-09-17 ENCOUNTER — Ambulatory Visit: Admitting: Physician Assistant
